# Patient Record
Sex: Female | Born: 1965 | Race: White | Hispanic: No | Marital: Single | Smoking: Never smoker
Health system: Southern US, Community
[De-identification: ages and names within clinical notes are randomized; demographics above are authoritative.]

## PROBLEM LIST (undated history)

## (undated) DIAGNOSIS — N1330 Unspecified hydronephrosis: Secondary | ICD-10-CM

## (undated) DIAGNOSIS — K743 Primary biliary cirrhosis: Secondary | ICD-10-CM

## (undated) DIAGNOSIS — Z22322 Carrier or suspected carrier of Methicillin resistant Staphylococcus aureus: Secondary | ICD-10-CM

## (undated) DIAGNOSIS — I5189 Other ill-defined heart diseases: Secondary | ICD-10-CM

## (undated) DIAGNOSIS — N200 Calculus of kidney: Secondary | ICD-10-CM

## (undated) DIAGNOSIS — C801 Malignant (primary) neoplasm, unspecified: Secondary | ICD-10-CM

## (undated) DIAGNOSIS — E785 Hyperlipidemia, unspecified: Secondary | ICD-10-CM

## (undated) DIAGNOSIS — Z8669 Personal history of other diseases of the nervous system and sense organs: Secondary | ICD-10-CM

## (undated) DIAGNOSIS — T7840XA Allergy, unspecified, initial encounter: Secondary | ICD-10-CM

## (undated) DIAGNOSIS — G43909 Migraine, unspecified, not intractable, without status migrainosus: Secondary | ICD-10-CM

## (undated) DIAGNOSIS — Z7901 Long term (current) use of anticoagulants: Secondary | ICD-10-CM

## (undated) DIAGNOSIS — D509 Iron deficiency anemia, unspecified: Secondary | ICD-10-CM

## (undated) DIAGNOSIS — I10 Essential (primary) hypertension: Secondary | ICD-10-CM

## (undated) DIAGNOSIS — E042 Nontoxic multinodular goiter: Secondary | ICD-10-CM

## (undated) DIAGNOSIS — G4730 Sleep apnea, unspecified: Secondary | ICD-10-CM

## (undated) DIAGNOSIS — J45909 Unspecified asthma, uncomplicated: Secondary | ICD-10-CM

## (undated) DIAGNOSIS — K760 Fatty (change of) liver, not elsewhere classified: Secondary | ICD-10-CM

## (undated) DIAGNOSIS — Z9884 Bariatric surgery status: Secondary | ICD-10-CM

## (undated) DIAGNOSIS — Z8679 Personal history of other diseases of the circulatory system: Secondary | ICD-10-CM

## (undated) DIAGNOSIS — N201 Calculus of ureter: Secondary | ICD-10-CM

## (undated) DIAGNOSIS — S3011XA Contusion of abdominal wall, initial encounter: Secondary | ICD-10-CM

## (undated) DIAGNOSIS — C44301 Unspecified malignant neoplasm of skin of nose: Secondary | ICD-10-CM

## (undated) DIAGNOSIS — R001 Bradycardia, unspecified: Secondary | ICD-10-CM

## (undated) DIAGNOSIS — N870 Mild cervical dysplasia: Secondary | ICD-10-CM

## (undated) DIAGNOSIS — Z85828 Personal history of other malignant neoplasm of skin: Secondary | ICD-10-CM

## (undated) DIAGNOSIS — Z973 Presence of spectacles and contact lenses: Secondary | ICD-10-CM

## (undated) DIAGNOSIS — M810 Age-related osteoporosis without current pathological fracture: Secondary | ICD-10-CM

## (undated) DIAGNOSIS — K449 Diaphragmatic hernia without obstruction or gangrene: Secondary | ICD-10-CM

## (undated) DIAGNOSIS — S301XXA Contusion of abdominal wall, initial encounter: Secondary | ICD-10-CM

## (undated) DIAGNOSIS — Z8639 Personal history of other endocrine, nutritional and metabolic disease: Secondary | ICD-10-CM

## (undated) DIAGNOSIS — E119 Type 2 diabetes mellitus without complications: Secondary | ICD-10-CM

## (undated) DIAGNOSIS — Z8719 Personal history of other diseases of the digestive system: Secondary | ICD-10-CM

## (undated) DIAGNOSIS — K219 Gastro-esophageal reflux disease without esophagitis: Secondary | ICD-10-CM

## (undated) DIAGNOSIS — Z87442 Personal history of urinary calculi: Secondary | ICD-10-CM

## (undated) HISTORY — PX: LIPOSUCTION: SHX10

## (undated) HISTORY — DX: Primary biliary cirrhosis: K74.3

## (undated) HISTORY — DX: Hyperlipidemia, unspecified: E78.5

## (undated) HISTORY — DX: Type 2 diabetes mellitus without complications: E11.9

## (undated) HISTORY — DX: Sleep apnea, unspecified: G47.30

## (undated) HISTORY — PX: CHOLECYSTECTOMY: SHX55

## (undated) HISTORY — PX: HERNIA REPAIR: SHX51

## (undated) HISTORY — PX: LAPAROSCOPIC CHOLECYSTECTOMY: SUR755

## (undated) HISTORY — DX: Migraine, unspecified, not intractable, without status migrainosus: G43.909

## (undated) HISTORY — PX: OTHER SURGICAL HISTORY: SHX169

## (undated) HISTORY — PX: BRACHIOPLASTY: SHX5755

## (undated) HISTORY — DX: Calculus of kidney: N20.0

## (undated) HISTORY — PX: CYSTOSCOPY WITH RETROGRADE PYELOGRAM, URETEROSCOPY AND STENT PLACEMENT: SHX5789

## (undated) HISTORY — DX: Allergy, unspecified, initial encounter: T78.40XA

## (undated) HISTORY — PX: PANNICULECTOMY: SHX5360

## (undated) HISTORY — PX: TONSILLECTOMY AND ADENOIDECTOMY: SUR1326

## (undated) HISTORY — PX: PLACEMENT OF BREAST IMPLANTS: SHX6334

## (undated) HISTORY — PX: BARIATRIC SURGERY: SHX1103

## (undated) HISTORY — DX: Essential (primary) hypertension: I10

## (undated) HISTORY — PX: AUGMENTATION MAMMAPLASTY: SUR837

---
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
## 2005-06-15 ENCOUNTER — Ambulatory Visit: Payer: Self-pay | Admitting: Internal Medicine

## 2006-05-24 ENCOUNTER — Emergency Department: Payer: Self-pay | Admitting: Emergency Medicine

## 2007-04-21 ENCOUNTER — Emergency Department: Payer: Self-pay | Admitting: Emergency Medicine

## 2008-07-16 ENCOUNTER — Emergency Department: Payer: Self-pay | Admitting: Emergency Medicine

## 2010-09-30 ENCOUNTER — Ambulatory Visit: Payer: Self-pay | Admitting: Family Medicine

## 2012-02-07 ENCOUNTER — Ambulatory Visit: Payer: Self-pay | Admitting: Urology

## 2012-02-07 ENCOUNTER — Observation Stay: Payer: Self-pay | Admitting: Urology

## 2012-02-07 LAB — BASIC METABOLIC PANEL
Anion Gap: 12 (ref 7–16)
BUN: 14 mg/dL (ref 7–18)
Calcium, Total: 9 mg/dL (ref 8.5–10.1)
Chloride: 104 mmol/L (ref 98–107)
Co2: 25 mmol/L (ref 21–32)
Creatinine: 0.79 mg/dL (ref 0.60–1.30)
EGFR (African American): >60
EGFR (Non-African Amer.): >60
Glucose: 149 mg/dL — ABNORMAL HIGH (ref 65–99)
Osmolality: 285 (ref 275–301)
Potassium: 4.2 mmol/L (ref 3.5–5.1)
Sodium: 141 mmol/L (ref 136–145)

## 2012-02-07 LAB — URINALYSIS, COMPLETE
Bilirubin,UR: NEGATIVE
Glucose,UR: NEGATIVE mg/dL (ref 0–75)
Ketone: NEGATIVE
Leukocyte Esterase: NEGATIVE
Nitrite: NEGATIVE
Ph: 5 (ref 4.5–8.0)
Protein: 30
RBC,UR: 189 /HPF (ref 0–5)
Specific Gravity: 1.018 (ref 1.003–1.030)
Squamous Epithelial: 18
WBC UR: 2 /HPF (ref 0–5)

## 2012-02-07 LAB — CBC
HCT: 42.5 % (ref 35.0–47.0)
HGB: 14.2 g/dL (ref 12.0–16.0)
MCH: 30 pg (ref 26.0–34.0)
MCHC: 33.4 g/dL (ref 32.0–36.0)
MCV: 90 fL (ref 80–100)
Platelet: 214 10*3/uL (ref 150–440)
RBC: 4.72 10*6/uL (ref 3.80–5.20)
RDW: 13.5 % (ref 11.5–14.5)
WBC: 6.4 10*3/uL (ref 3.6–11.0)

## 2012-02-07 LAB — PREGNANCY, URINE: Pregnancy Test, Urine: NEGATIVE m[IU]/mL

## 2012-02-08 LAB — CBC WITH DIFFERENTIAL/PLATELET
Basophil #: 0 10*3/uL (ref 0.0–0.1)
Basophil %: 0.3 %
Eosinophil #: 0.1 10*3/uL (ref 0.0–0.7)
Eosinophil %: 1 %
HCT: 37.4 % (ref 35.0–47.0)
HGB: 12.4 g/dL (ref 12.0–16.0)
Lymphocyte #: 2.4 10*3/uL (ref 1.0–3.6)
Lymphocyte %: 28.4 %
MCH: 30.1 pg (ref 26.0–34.0)
MCHC: 33.1 g/dL (ref 32.0–36.0)
MCV: 91 fL (ref 80–100)
Monocyte #: 0.7 10*3/uL (ref 0.0–0.7)
Monocyte %: 8.1 %
Neutrophil #: 5.2 10*3/uL (ref 1.4–6.5)
Neutrophil %: 62.2 %
Platelet: 202 10*3/uL (ref 150–440)
RBC: 4.12 10*6/uL (ref 3.80–5.20)
RDW: 14.1 % (ref 11.5–14.5)
WBC: 8.3 10*3/uL (ref 3.6–11.0)

## 2012-02-08 LAB — BASIC METABOLIC PANEL
Anion Gap: 7 (ref 7–16)
BUN: 13 mg/dL (ref 7–18)
Calcium, Total: 8.1 mg/dL — ABNORMAL LOW (ref 8.5–10.1)
Chloride: 105 mmol/L (ref 98–107)
Co2: 29 mmol/L (ref 21–32)
Creatinine: 0.78 mg/dL (ref 0.60–1.30)
EGFR (African American): >60
EGFR (Non-African Amer.): >60
Glucose: 113 mg/dL — ABNORMAL HIGH (ref 65–99)
Osmolality: 282 (ref 275–301)
Potassium: 4 mmol/L (ref 3.5–5.1)
Sodium: 141 mmol/L (ref 136–145)

## 2012-02-09 LAB — URINE CULTURE

## 2012-02-11 ENCOUNTER — Ambulatory Visit: Payer: Self-pay | Admitting: Urology

## 2012-02-22 ENCOUNTER — Ambulatory Visit: Payer: Self-pay | Admitting: Urology

## 2012-02-29 ENCOUNTER — Ambulatory Visit: Payer: Self-pay | Admitting: Urology

## 2012-03-08 ENCOUNTER — Emergency Department: Payer: Self-pay | Admitting: Emergency Medicine

## 2012-03-08 LAB — URINALYSIS, COMPLETE
Bilirubin,UR: NEGATIVE
Glucose,UR: NEGATIVE mg/dL (ref 0–75)
Ketone: NEGATIVE
Nitrite: NEGATIVE
Ph: 5 (ref 4.5–8.0)
Protein: 100
RBC,UR: 930 /HPF (ref 0–5)
Specific Gravity: 1.017 (ref 1.003–1.030)
Squamous Epithelial: NONE SEEN
WBC UR: 16 /HPF (ref 0–5)

## 2012-03-08 LAB — BASIC METABOLIC PANEL
Anion Gap: 7 (ref 7–16)
BUN: 15 mg/dL (ref 7–18)
Calcium, Total: 9.2 mg/dL (ref 8.5–10.1)
Chloride: 105 mmol/L (ref 98–107)
Co2: 30 mmol/L (ref 21–32)
Creatinine: 0.88 mg/dL (ref 0.60–1.30)
EGFR (African American): >60
EGFR (Non-African Amer.): >60
Glucose: 114 mg/dL — ABNORMAL HIGH (ref 65–99)
Osmolality: 285 (ref 275–301)
Potassium: 4.1 mmol/L (ref 3.5–5.1)
Sodium: 142 mmol/L (ref 136–145)

## 2012-03-08 LAB — CBC
HCT: 39.7 % (ref 35.0–47.0)
HGB: 13.4 g/dL (ref 12.0–16.0)
MCH: 30.1 pg (ref 26.0–34.0)
MCHC: 33.8 g/dL (ref 32.0–36.0)
MCV: 89 fL (ref 80–100)
Platelet: 167 10*3/uL (ref 150–440)
RBC: 4.47 10*6/uL (ref 3.80–5.20)
RDW: 13.2 % (ref 11.5–14.5)
WBC: 5.4 10*3/uL (ref 3.6–11.0)

## 2012-03-09 LAB — URINE CULTURE

## 2012-03-24 ENCOUNTER — Ambulatory Visit: Payer: Self-pay | Admitting: Urology

## 2012-03-25 ENCOUNTER — Ambulatory Visit: Payer: Self-pay | Admitting: Family Medicine

## 2014-01-11 ENCOUNTER — Encounter: Payer: Self-pay | Admitting: Adult Health

## 2014-01-11 ENCOUNTER — Ambulatory Visit (INDEPENDENT_AMBULATORY_CARE_PROVIDER_SITE_OTHER): Payer: 59 | Admitting: Adult Health

## 2014-01-11 VITALS — BP 136/82 | HR 91 | Temp 98.4°F | Resp 16 | Ht 69.25 in | Wt 329.0 lb

## 2014-01-11 DIAGNOSIS — M25569 Pain in unspecified knee: Secondary | ICD-10-CM

## 2014-01-11 DIAGNOSIS — R209 Unspecified disturbances of skin sensation: Secondary | ICD-10-CM

## 2014-01-11 DIAGNOSIS — M25562 Pain in left knee: Secondary | ICD-10-CM

## 2014-01-11 DIAGNOSIS — M25561 Pain in right knee: Secondary | ICD-10-CM

## 2014-01-11 DIAGNOSIS — H6240 Otitis externa in other diseases classified elsewhere, unspecified ear: Secondary | ICD-10-CM

## 2014-01-11 DIAGNOSIS — I10 Essential (primary) hypertension: Secondary | ICD-10-CM

## 2014-01-11 DIAGNOSIS — E119 Type 2 diabetes mellitus without complications: Secondary | ICD-10-CM | POA: Insufficient documentation

## 2014-01-11 DIAGNOSIS — E669 Obesity, unspecified: Secondary | ICD-10-CM | POA: Insufficient documentation

## 2014-01-11 DIAGNOSIS — Z0000 Encounter for general adult medical examination without abnormal findings: Secondary | ICD-10-CM | POA: Insufficient documentation

## 2014-01-11 DIAGNOSIS — B369 Superficial mycosis, unspecified: Secondary | ICD-10-CM

## 2014-01-11 DIAGNOSIS — Z1239 Encounter for other screening for malignant neoplasm of breast: Secondary | ICD-10-CM

## 2014-01-11 DIAGNOSIS — R200 Anesthesia of skin: Secondary | ICD-10-CM

## 2014-01-11 DIAGNOSIS — R202 Paresthesia of skin: Secondary | ICD-10-CM

## 2014-01-11 MED ORDER — CLOTRIMAZOLE 1 % EX SOLN: 1.0000 "application " | Freq: Two times a day (BID) | CUTANEOUS | Status: DC

## 2014-01-11 NOTE — Progress Notes (Signed)
Patient ID: [name], female   DOB: 06-29-1966, 48 y.o.   MRN: [id number]    Subjective:    Patient ID: [name], female    DOB: Mar 17, 1966, 48 y.o.   MRN: [id number]  HPI  [name] is a pleasant 48 y/o female who presents to clinic to establish care. Previously followed at [facility]. Will request records. She has a few concerns and issues that she would like to address in the near future.   1) She reports that she thinks she is a diabetic and that she has had elevated HgbA1c of 8.4% recently. She has been taking glycotrol (health food store) because she has preferred to go the "holistic" route. [name] is experiencing numbness and tingling of her toes. Denies any wounds of skin breakdown of her feet.   2) She also reports that she needs to lose 10% of body weight (~33 lbs) for eligibility of premium insurance rate through her employer. She is currently working 3 jobs and finds little time to exercise or focus on her health. She has made a decision that she needs to address some of her problems before they get too out of hand. She is also having bilateral LE swelling. Works involves being on her feet for extended periods of time. She does not wear any support hose. In addition, her knees have been hurting her. She feels that by losing weight she will be able to improve her blood glucose, her leg swelling and the pain in her knees.    Past Medical History  Diagnosis Date  . Asthma   . Hypertension   . Hyperlipidemia   . Migraine     Has used imitrex  . Kidney stone      Past Surgical History  Procedure Laterality Date  . Cesarean section    . Tonsillectomy and adenoidectomy       Family History  Problem Relation Age of Onset  . Alcohol abuse Mother   . Hyperlipidemia Mother   . Hypertension Mother   . Hyperlipidemia Father   . Hypertension Father   . Parkinson's disease Father   . Drug abuse Sister   . Cancer Maternal Grandmother     breast cancer  . Cancer Paternal  Grandmother      History   Social History  . Marital Status: Single    Spouse Name: N/A    Number of Children: 2  . Years of Education: 14   Occupational History  . 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 Teaches microbiology   . [employer] - Weekend   Social History Main Topics  . Smoking status: Never Smoker   . Smokeless tobacco: Never Used  . Alcohol Use: No  . Drug Use: No  . Sexual Activity: Not on file   Other Topics Concern  . Not on file   Social History Narrative   [name] grew up partly in [place] and then [place]. She lives in [place] with her two daughter. 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 She enjoys the outdoors, gardening.      Review of Systems  Constitutional: Negative.        Overweight  HENT: Negative.   Eyes: Negative.   Respiratory: Negative.   Cardiovascular: Positive for leg swelling.  Gastrointestinal: Negative.   Endocrine: Negative.   Genitourinary: Negative.   Musculoskeletal: Positive for arthralgias (  knee pain bil) and joint swelling.  Skin:       Cyst on midline chest. Has appointment to be evaluated by derm  Allergic/Immunologic: Negative.   Neurological: Positive for numbness (tingling of LE. Cramping during the night).  Hematological: Negative.   Psychiatric/Behavioral: Negative.        Objective:  BP 136/82  Pulse 91  Temp(Src) 98.4 F (36.9 C) (Oral)  Resp 16  Ht 5' 9.25" (1.759 m)  Wt 329 lb (149.233 kg)  BMI 48.23 kg/m2  SpO2 95%  LMP 10/28/2013   Physical Exam  Constitutional: She is oriented to person, place, and time. No distress.  Overweight pleasant female  HENT:  Head: Normocephalic and atraumatic.  Right Ear: External ear normal.  Nose: Nose normal.  Mouth/Throat: Oropharynx is clear and moist.  Left ear canal fungus  Eyes: Conjunctivae and EOM are normal. Pupils are equal, round, and reactive to light.    Neck: Normal range of motion. Neck supple. No tracheal deviation present. No thyromegaly present.  Cardiovascular: Normal rate, regular rhythm, normal heart sounds and intact distal pulses.  Exam reveals no gallop and no friction rub.   No murmur heard. Pulmonary/Chest: Effort normal and breath sounds normal. No respiratory distress. She has no wheezes. She has no rales.  Abdominal: Soft. Bowel sounds are normal. She exhibits no distension and no mass. There is tenderness (epigastric area). There is no rebound and no guarding.  Musculoskeletal: Normal range of motion. She exhibits edema. She exhibits no tenderness.  Trace edema bil LE  Lymphadenopathy:    She has no cervical adenopathy.  Neurological: She is alert and oriented to person, place, and time. She has normal reflexes. No cranial nerve deficit. Coordination normal.  Skin: Skin is warm and dry.  Cysts - appears sebaceous - she will be seeing derm to evaluate for removal  Psychiatric: She has a normal mood and affect. Her behavior is normal. Judgment and thought content normal.       Assessment & Plan:   1. Routine general medical examination at a health care facility Normal physical exam excluding breast, pelvic/PAP. Problems identified are listed separately. Will check routine labs: cbc w/diff, cmet, lipids, HgbA1c, TSH, vit D, B12. She works at [employer] and will have these drawn there. Will request medical records from previous PCP.   2. Screening for breast cancer Needs screening mammogram. Pt will self schedule  - MM DIGITAL SCREENING BILATERAL; Future  3. DM type 2 (diabetes mellitus, type 2) Last HgbA1c 8.4% per pt report. Will recheck lab. She has been taking OTC supplement from health food store. Had discussion with pt regarding lack of regulation with supplements. Would agree in holistic approach with other problems but not with diabetes given severe consequences of poorly controlled diabetes. She appeared to be in  agreement. If A1C above 7% will start medication. Check urine microalbumin  4. Numbness and tingling of both legs Suspect diabetic neuropathy given reports of elevated A1c. Priority is management of diabetes. May need medication such as gabapentin to management symptoms  5. HTN On atenolol and appears to be controlled. Reports good control. Check metabolic panel. Continue to follow.  6. Knee pain, bilateral Weight contributing to symptoms. She wants to lose weight. I suspect this will alleviate some of her symptoms. May need referral to Ortho for evaluation and further recommendations.  7. 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 Start clotrimazole 1% solution bid x 10 days. Swab into ear canal and allow to  melt down in canal. Re-assess after 10 days. If fungus still present will refer to ENT.  8. Obesity Needs to lose 10% of body weight which is ~ 34 lbs. Provided with Dr. Lupita Dawn diet information which focuses on low glycemic index foods. This will help her control her blood glucose and also lose weight.

## 2014-01-11 NOTE — Progress Notes (Signed)
Pre visit review using our clinic review tool, if applicable. No additional management support is needed unless otherwise documented below in the visit note. 

## 2014-01-12 ENCOUNTER — Telehealth: Payer: Self-pay | Admitting: *Deleted

## 2014-01-12 ENCOUNTER — Telehealth: Payer: Self-pay

## 2014-01-12 ENCOUNTER — Telehealth: Payer: Self-pay | Admitting: Adult Health

## 2014-01-12 NOTE — Telephone Encounter (Signed)
Relevant patient education assigned to patient using Emmi. ° °

## 2014-01-12 NOTE — Telephone Encounter (Signed)
Left message for pt, advising to use Clotrimazole 1% solution bid x 10 days, swab in ear canal and allow to melt down into ear canal.  Advised to have followup appointment in 15 days, recommended to cal office to schedule followup appointment.

## 2014-01-22 ENCOUNTER — Encounter (INDEPENDENT_AMBULATORY_CARE_PROVIDER_SITE_OTHER): Payer: Self-pay

## 2014-01-22 ENCOUNTER — Encounter: Payer: Self-pay | Admitting: Adult Health

## 2014-01-22 ENCOUNTER — Encounter: Payer: Self-pay | Admitting: General Surgery

## 2014-01-22 ENCOUNTER — Ambulatory Visit (INDEPENDENT_AMBULATORY_CARE_PROVIDER_SITE_OTHER): Payer: 59 | Admitting: Adult Health

## 2014-01-22 VITALS — BP 126/86 | HR 91 | Resp 16 | Wt 328.0 lb

## 2014-01-22 DIAGNOSIS — IMO0001 Reserved for inherently not codable concepts without codable children: Secondary | ICD-10-CM

## 2014-01-22 DIAGNOSIS — E1165 Type 2 diabetes mellitus with hyperglycemia: Secondary | ICD-10-CM

## 2014-01-22 DIAGNOSIS — IMO0002 Reserved for concepts with insufficient information to code with codable children: Secondary | ICD-10-CM

## 2014-01-22 DIAGNOSIS — L729 Follicular cyst of the skin and subcutaneous tissue, unspecified: Secondary | ICD-10-CM

## 2014-01-22 DIAGNOSIS — L723 Sebaceous cyst: Secondary | ICD-10-CM

## 2014-01-22 DIAGNOSIS — B369 Superficial mycosis, unspecified: Secondary | ICD-10-CM

## 2014-01-22 DIAGNOSIS — H6240 Otitis externa in other diseases classified elsewhere, unspecified ear: Secondary | ICD-10-CM

## 2014-01-22 DIAGNOSIS — R059 Cough, unspecified: Secondary | ICD-10-CM

## 2014-01-22 DIAGNOSIS — R05 Cough: Secondary | ICD-10-CM

## 2014-01-22 MED ORDER — INSULIN DETEMIR 100 UNIT/ML FLEXPEN: 10.0000 [IU] | PEN_INJECTOR | Freq: Every day | SUBCUTANEOUS | Status: DC

## 2014-01-22 MED ORDER — BENZONATATE 200 MG PO CAPS: 200.0000 mg | ORAL_CAPSULE | Freq: Two times a day (BID) | ORAL | Status: DC | PRN

## 2014-01-22 MED ORDER — METFORMIN HCL 500 MG PO TABS: 500.0000 mg | ORAL_TABLET | Freq: Two times a day (BID) | ORAL | Status: DC

## 2014-01-22 NOTE — Progress Notes (Signed)
Patient ID: [name], female   DOB: Jan 12, 1966, 48 y.o.   MRN: [id number]    Subjective:    Patient ID: [name], female    DOB: [birth year], 48 y.o.   MRN: [id number]  HPI  Pt is a 48 y/o female who recently establish care in my clinic. She had labs drawn which showed diabetes uncontrolled (HgbA1c 11.7%), elevated microalbumin, HLD, elevated liver enzymes. When she first saw me she stated that she wanted a more holistic approach to her healthcare. She was taking OTC supplement to control her diabetes but, as can be seen, this has been ineffective.  During last visit she was prescribed clotrimazole bid to treat fungal ear infection. She reports that she went to [facility] for cough approximately 4 days ago and was told she should not do this medication "there was nothing wrong with her ear". So, pt did not start treatment for her fungal ear infection. Her cough is still ongoing. Feeling better but not resolved.  She has a cyst on the center of her chest between both breast. She went to see a Dermatologist but she did not remove the cyst. She wants the cyst removed.    Past Medical History  Diagnosis Date  . Asthma   . Hypertension   . Hyperlipidemia   . Migraine     Has used imitrex  . Kidney stone     Current Outpatient Prescriptions on File Prior to Visit  Medication Sig Dispense Refill  . atenolol (TENORMIN) 25 MG tablet Take 25 mg by mouth daily.      . Chromium Picolinate 200 MCG CAPS Take 1 capsule by mouth 3 (three) times daily with meals.      Marland Kitchen Specialty Vitamins Products (GLYCOTROL PO) Take 1 capsule by mouth 3 (three) times daily with meals.      . clotrimazole (LOTRIMIN) 1 % external solution Apply 1 application topically 2 (two) times daily. Apply to external ear canal twice daily x 10 days as directed.  30 mL  0   No current facility-administered medications on file prior to visit.     Review of Systems  Constitutional: Negative.   HENT: Negative for ear  discharge and ear pain.   Respiratory: Positive for cough.   Cardiovascular: Negative.  Negative for chest pain and leg swelling.  Gastrointestinal: Negative.   All other systems reviewed and are negative.       Objective:  BP 126/86  Pulse 91  Resp 16  Wt 328 lb (148.78 kg)  SpO2 96%  LMP 10/28/2013   Physical Exam  Constitutional: She is oriented to person, place, and time.  Overweight, pleasant female in NAD  HENT:  Head: Normocephalic and atraumatic.  Mouth/Throat: Oropharynx is clear and moist.  Left ear fungus noted on TM  Cardiovascular: Normal rate, regular rhythm, normal heart sounds and intact distal pulses.  Exam reveals no gallop and no friction rub.   No murmur heard. Pulmonary/Chest: Effort normal and breath sounds normal. No respiratory distress. She has no wheezes. She has no rales.  Musculoskeletal: Normal range of motion.  Lymphadenopathy:    She has no cervical adenopathy.  Neurological: She is alert and oriented to person, place, and time.  Skin: Skin is warm and dry.  Psychiatric: She has a normal mood and affect. Her behavior is normal. Judgment and thought content normal.       Assessment & Plan:    1. Diabetes type 2, uncontrolled Start metformin 500 mg  bid. Will increase if tolerates current dose. Also start Levemir 10 units daily. Increase every 3 days by 3 units until blood glucose below 150. Refer to diabetic education since newly diagnosed. Repeat A1c in 3 months. Provided pt with glucometer. She is to check her blood glucose 3-4 daily  - Ambulatory referral to diabetic education  2. Otic fungal infection Lotrimin bid to bilateral ears. If no resolution of fungal infection will refer to ENT  3. Cyst of skin Pt has cyst in the center of her chest. She saw Dermatology but cyst was not removed. Pt would like cyst removed. Refer to Dr. Bary Castilla. - Ambulatory referral to General Surgery  4. Cough Tessalon for cough. Lungs are clear.

## 2014-01-22 NOTE — Patient Instructions (Addendum)
  I am referring you to [facility] for diabetic education.  They will contact you with an appointment.  Start Levemir Insulin 10 units sub q daily into the skin.  Check your blood glucose 3-4 times a day before meals and at bedtime and record. Bring with you at your next appointment.  Start Metformin 500 mg twice a day with meals.  Use clotrimazole (lotrimin) for your ear fungus twice a day.  Tessalon for your cough twice a day as needed.  I am referring you to a general surgeon for the cyst on your chest.  Return for follow up appointment in 4 weeks.

## 2014-01-22 NOTE — Progress Notes (Signed)
Pre visit review using our clinic review tool, if applicable. No additional management support is needed unless otherwise documented below in the visit note. 

## 2014-01-23 ENCOUNTER — Telehealth: Payer: Self-pay | Admitting: *Deleted

## 2014-01-23 NOTE — Telephone Encounter (Signed)
Advised Pt that [facility] Lab order form is ready for pickup

## 2014-02-01 ENCOUNTER — Encounter: Payer: Self-pay | Admitting: Adult Health

## 2014-02-07 ENCOUNTER — Encounter: Payer: Self-pay | Admitting: General Surgery

## 2014-02-07 ENCOUNTER — Ambulatory Visit (INDEPENDENT_AMBULATORY_CARE_PROVIDER_SITE_OTHER): Payer: 59 | Admitting: General Surgery

## 2014-02-07 VITALS — BP 124/76 | HR 82 | Resp 14 | Ht 68.0 in | Wt 330.0 lb

## 2014-02-07 DIAGNOSIS — L02219 Cutaneous abscess of trunk, unspecified: Secondary | ICD-10-CM

## 2014-02-07 DIAGNOSIS — L02213 Cutaneous abscess of chest wall: Secondary | ICD-10-CM

## 2014-02-07 DIAGNOSIS — L03319 Cellulitis of trunk, unspecified: Secondary | ICD-10-CM

## 2014-02-07 NOTE — Progress Notes (Signed)
Patient ID: [name], female   DOB: 03-Oct-1966, 48 y.o.   MRN: [id number]  Chief Complaint  Patient presents with  . Other    cyst     HPI [name] is a 48 y.o. female.  who presents for a cyst between her breast and breast evaluation. Patient does not perform regular self breast checks.   States she noticed it about 6 months ago. She states the area had gotten bigger. Later it drained a lot. tDr. Derrel Nip put her on doxycycline and the area got smaller. 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  HPI  Past Medical History  Diagnosis Date  . Asthma   . Hypertension   . Hyperlipidemia   . Migraine     Has used imitrex  . Kidney stone   . Diabetes mellitus without complication     Past Surgical History  Procedure Laterality Date  . Cesarean section    . Tonsillectomy and adenoidectomy      Family History  Problem Relation Age of Onset  . Alcohol abuse Mother   . Hyperlipidemia Mother   . Hypertension Mother   . Hyperlipidemia Father   . Hypertension Father   . Parkinson's disease Father   . Drug abuse Sister   . Cancer Maternal Grandmother     breast cancer  . Cancer Paternal Grandmother     Social History History  Substance Use Topics  . Smoking status: Never Smoker   . Smokeless tobacco: Never Used  . Alcohol Use: No    Allergies  Allergen Reactions  . Nsaids Hives    Current Outpatient Prescriptions  Medication Sig Dispense Refill  . atenolol (TENORMIN) 25 MG tablet Take 25 mg by mouth daily.      . clotrimazole (LOTRIMIN) 1 % external solution Apply 1 application topically 2 (two) times daily. Apply to external ear canal twice daily x 10 days as directed.  30 mL  0  . Insulin Detemir (LEVEMIR) 100 UNIT/ML Pen Inject 10 Units into the skin daily at 10 pm.  3 pen  11  . metFORMIN (GLUCOPHAGE) 500 MG tablet Take 1 tablet (500 mg total) by mouth 2 (two) times daily with a meal.  60 tablet  3  . benzonatate  (TESSALON) 200 MG capsule Take 1 capsule (200 mg total) by mouth 2 (two) times daily as needed for cough.  30 capsule  0   No current facility-administered medications for this visit.    Review of Systems Review of Systems  Constitutional: Negative.   Respiratory: Negative.   Cardiovascular: Negative.     Blood pressure 124/76, pulse 82, resp. rate 14, height 5\' 8"  (1.727 m), weight 330 lb (149.687 kg), last menstrual period 10/28/2013.  Physical Exam Physical Exam  Constitutional: She is oriented to person, place, and time. She appears well-developed and well-nourished.  Lymphadenopathy:    She has no axillary adenopathy.  Neurological: She is alert and oriented to person, place, and time.  Skin: Skin is warm and dry.  Patient points to the upper stereum area where she had the abscess recently. There is no papilae or visible finding in this area. No apparent signs of skin involvement in axillae or chest wall area.   Data Reviewed  none Assessment    By history upper chest skin abscess is resolved. No residual findings at present.    Plan    Patient to return as needed. If there is  recurrence of the swelling she is to call for a reevaluation.       Delvecchio Madole G 02/07/2014, 11:42 AM

## 2014-02-13 ENCOUNTER — Encounter: Payer: Self-pay | Admitting: Adult Health

## 2014-02-13 ENCOUNTER — Telehealth: Payer: Self-pay | Admitting: *Deleted

## 2014-02-13 NOTE — Telephone Encounter (Signed)
Pharmacy Note:  Please send Rx for Novofine 32G tip needle with instructions and Dx code

## 2014-02-13 NOTE — Telephone Encounter (Signed)
Can you call pharmacy and clarify this request

## 2014-02-14 MED ORDER — INSULIN PEN NEEDLE 32G X 6 MM MISC
1.0000 | Freq: Every day | Status: DC
Start: 2014-02-14 — End: 2014-04-05

## 2014-02-14 NOTE — Telephone Encounter (Signed)
Instructions need to be how many times a day pt is to use and the Dx code is 250.00

## 2014-02-14 NOTE — Telephone Encounter (Signed)
But what is Novofine - is that a type of needle? I have never ordered this

## 2014-02-21 ENCOUNTER — Ambulatory Visit: Payer: 59 | Admitting: Adult Health

## 2014-02-26 ENCOUNTER — Encounter: Payer: Self-pay | Admitting: Adult Health

## 2014-02-26 ENCOUNTER — Ambulatory Visit (INDEPENDENT_AMBULATORY_CARE_PROVIDER_SITE_OTHER): Payer: 59 | Admitting: Adult Health

## 2014-02-26 VITALS — BP 114/84 | HR 83 | Temp 98.3°F | Resp 14 | Wt 329.0 lb

## 2014-02-26 DIAGNOSIS — IMO0002 Reserved for concepts with insufficient information to code with codable children: Secondary | ICD-10-CM

## 2014-02-26 DIAGNOSIS — IMO0001 Reserved for inherently not codable concepts without codable children: Secondary | ICD-10-CM

## 2014-02-26 DIAGNOSIS — B49 Unspecified mycosis: Secondary | ICD-10-CM

## 2014-02-26 DIAGNOSIS — E1165 Type 2 diabetes mellitus with hyperglycemia: Secondary | ICD-10-CM

## 2014-02-26 MED ORDER — GLUCOSE BLOOD VI STRP: ORAL_STRIP | Status: DC

## 2014-02-26 MED ORDER — "INSULIN SYRINGE-NEEDLE U-100 25G X 5/8"" 1 ML MISC": 1.0000 | Status: DC | PRN

## 2014-02-26 MED ORDER — INSULIN REGULAR HUMAN 100 UNIT/ML IJ SOLN: INTRAMUSCULAR | Status: DC

## 2014-02-26 NOTE — Patient Instructions (Signed)
Continue the levemir 10 units in the morning and 10 units in the evening.  Check blood glucose before meals and at bedtime. Record your readings.  Before meals Sliding scale as follows: Less than 70 = 0 units 70-130 = 0 units 131-180 = 4 units 181-240 = 8 units 241-300 = 10 units 301-350 = 12 units 351-400 = 16 units >400 = 16 units and call PCP  Increase physical activity. Start walking for 30 minutes 3 times a week and gradually increase.

## 2014-02-26 NOTE — Progress Notes (Signed)
Subjective:    Patient ID: [name], female    DOB: 1966/09/29, 48 y.o.   MRN: [id number]  HPI  Patient is a pleasant 48 year old female who presents to clinic for followup of uncontrolled diabetes. Recently her hemoglobin A1c was 11.9%. She is currently on Levemir 10 units in the evening. She has increased the Levemir to 10 units in the morning and 10 units in the evening. Blood sugars are averaging 240. Her labs also showed that she had elevated triglycerides at 444 mg/dL; however, she reports that she was not fasting when she had this blood work done. She had lipids drawn in March which showed slightly elevated triglycerides but not as high as these levels. Patient reports that she has not been feeling well. She is feeling sluggish, fatigued.   Current Outpatient Prescriptions on File Prior to Visit  Medication Sig Dispense Refill  . atenolol (TENORMIN) 25 MG tablet Take 25 mg by mouth daily.      . Insulin Detemir (LEVEMIR) 100 UNIT/ML Pen Inject 10 Units into the skin daily at 10 pm.  3 pen  11  . Insulin Pen Needle 32G X 6 MM MISC Inject 1 applicator into the skin at bedtime.  50 each  1  . metFORMIN (GLUCOPHAGE) 500 MG tablet Take 1 tablet (500 mg total) by mouth 2 (two) times daily with a meal.  60 tablet  3   No current facility-administered medications on file prior to visit.    Review of Systems  Constitutional: Positive for activity change and fatigue.  Respiratory: Negative.   Cardiovascular: Negative.   Gastrointestinal: Negative.   Genitourinary: Negative.   Musculoskeletal: Negative.   Neurological: Negative for dizziness, tremors, syncope, light-headedness and headaches.  Psychiatric/Behavioral: Negative.   All other systems reviewed and are negative.      Objective:   Physical Exam  Constitutional: She is oriented to person, place, and time. No distress.  Overweight, female  HENT:  Head: Normocephalic and atraumatic.  Left ear with white coating on TM.  She has been treated for fungus; however, not improved.  Eyes: Conjunctivae and EOM are normal.  Neck: Normal range of motion. Neck supple.  Cardiovascular: Normal rate, regular rhythm, normal heart sounds and intact distal pulses.  Exam reveals no gallop and no friction rub.   No murmur heard. Pulmonary/Chest: Effort normal and breath sounds normal. No respiratory distress. She has no wheezes. She has no rales.  Musculoskeletal: Normal range of motion.  Neurological: She is alert and oriented to person, place, and time. She has normal reflexes. Coordination normal.  Skin: Skin is warm and dry.  Psychiatric: She has a normal mood and affect. Her behavior is normal. Judgment and thought content normal.        Assessment & Plan:   1. Diabetes type 2, uncontrolled Levemir 10 units in the morning and 10 units in the evening. I have suggested to pt that if her am blood glucose levels remain above 150 that she should add 3 units to the evening dose of levemir. We are also adding a sliding scale. She is to check her blood sugar levels before each meal and cover as below. Return for f/u in 3 months. Call with any questions or concerns. Additional time spent with education on administration and use of sliding scale.  Sliding scale as follows: Less than 70 = 0 units 70-130 = 0 units 131-180 = 4 units 181-240 = 8 units 241-300 = 10 units 301-350 = 12  units 351-400 = 16 units >400 = 16 units and call PCP   2. Fungus infection Fungal ear infection treated with lotrin but not resolved. I am referring her to ENT for further evaluation and management.  - Ambulatory referral to ENT

## 2014-02-26 NOTE — Progress Notes (Signed)
Pre visit review using our clinic review tool, if applicable. No additional management support is needed unless otherwise documented below in the visit note. 

## 2014-03-01 ENCOUNTER — Encounter: Payer: Self-pay | Admitting: Adult Health

## 2014-03-05 ENCOUNTER — Telehealth: Payer: Self-pay | Admitting: *Deleted

## 2014-03-05 NOTE — Telephone Encounter (Signed)
PA request form for the Contour test strips placed in Raquel box

## 2014-03-27 ENCOUNTER — Encounter: Payer: Self-pay | Admitting: Adult Health

## 2014-03-29 ENCOUNTER — Other Ambulatory Visit: Payer: Self-pay | Admitting: Adult Health

## 2014-03-29 MED ORDER — "INSULIN SYRINGE 30G X 5/16"" 0.5 ML MISC": Status: DC

## 2014-04-03 ENCOUNTER — Encounter: Payer: Self-pay | Admitting: Adult Health

## 2014-04-05 MED ORDER — INSULIN PEN NEEDLE 32G X 6 MM MISC: 1.0000 | Freq: Every day | Status: DC

## 2014-04-05 MED ORDER — METFORMIN HCL 500 MG PO TABS: 500.0000 mg | ORAL_TABLET | Freq: Two times a day (BID) | ORAL | Status: DC

## 2014-04-05 MED ORDER — INSULIN DETEMIR 100 UNIT/ML FLEXPEN: 10.0000 [IU] | PEN_INJECTOR | Freq: Every day | SUBCUTANEOUS | Status: DC

## 2014-04-05 MED ORDER — GLUCOSE BLOOD VI STRP: ORAL_STRIP | Status: DC

## 2014-04-05 MED ORDER — ATENOLOL 25 MG PO TABS: 25.0000 mg | ORAL_TABLET | Freq: Every day | ORAL | Status: DC

## 2014-04-05 MED ORDER — INSULIN REGULAR HUMAN 100 UNIT/ML IJ SOLN: INTRAMUSCULAR | Status: DC

## 2014-04-05 MED ORDER — "INSULIN SYRINGE 30G X 5/16"" 0.5 ML MISC": Status: DC

## 2014-04-06 ENCOUNTER — Encounter: Payer: Self-pay | Admitting: Adult Health

## 2014-04-06 ENCOUNTER — Other Ambulatory Visit: Payer: Self-pay

## 2014-04-06 ENCOUNTER — Ambulatory Visit (INDEPENDENT_AMBULATORY_CARE_PROVIDER_SITE_OTHER)
Admission: RE | Admit: 2014-04-06 | Discharge: 2014-04-06 | Disposition: A | Discharge status: Home / Self Care | Payer: 59 | Source: Ambulatory Visit | Attending: Adult Health | Admitting: Adult Health

## 2014-04-06 ENCOUNTER — Ambulatory Visit (INDEPENDENT_AMBULATORY_CARE_PROVIDER_SITE_OTHER): Payer: 59 | Admitting: Adult Health

## 2014-04-06 VITALS — BP 130/84 | HR 90 | Temp 98.4°F | Resp 16 | Ht 68.0 in | Wt 330.2 lb

## 2014-04-06 DIAGNOSIS — R609 Edema, unspecified: Secondary | ICD-10-CM

## 2014-04-06 DIAGNOSIS — R600 Localized edema: Secondary | ICD-10-CM

## 2014-04-06 DIAGNOSIS — M25561 Pain in right knee: Secondary | ICD-10-CM

## 2014-04-06 DIAGNOSIS — M25569 Pain in unspecified knee: Secondary | ICD-10-CM

## 2014-04-06 LAB — CBC WITH DIFFERENTIAL/PLATELET
Basophils Absolute: 0 10*3/uL (ref 0.0–0.1)
Basophils Relative: 0.4 % (ref 0.0–3.0)
Eosinophils Absolute: 0.1 10*3/uL (ref 0.0–0.7)
Eosinophils Relative: 2.9 % (ref 0.0–5.0)
HCT: 40.4 % (ref 36.0–46.0)
Hemoglobin: 13.4 g/dL (ref 12.0–15.0)
Lymphocytes Relative: 38.7 % (ref 12.0–46.0)
Lymphs Abs: 1.9 10*3/uL (ref 0.7–4.0)
MCHC: 33.1 g/dL (ref 30.0–36.0)
MCV: 87.7 fl (ref 78.0–100.0)
Monocytes Absolute: 0.3 10*3/uL (ref 0.1–1.0)
Monocytes Relative: 6.8 % (ref 3.0–12.0)
Neutro Abs: 2.5 10*3/uL (ref 1.4–7.7)
Neutrophils Relative %: 51.2 % (ref 43.0–77.0)
Platelets: 157 10*3/uL (ref 150.0–400.0)
RBC: 4.61 Mil/uL (ref 3.87–5.11)
RDW: 13.1 % (ref 11.5–15.5)
WBC: 4.9 10*3/uL (ref 4.0–10.5)

## 2014-04-06 LAB — C-REACTIVE PROTEIN: CRP: 1.3 mg/dL (ref 0.5–20.0)

## 2014-04-06 LAB — SEDIMENTATION RATE: Sed Rate: 23 mm/hr — ABNORMAL HIGH (ref 0–22)

## 2014-04-06 MED ORDER — INSULIN DETEMIR 100 UNIT/ML FLEXPEN: 10.0000 [IU] | PEN_INJECTOR | Freq: Every day | SUBCUTANEOUS | Status: DC

## 2014-04-06 MED ORDER — HYDROCHLOROTHIAZIDE 25 MG PO TABS: 25.0000 mg | ORAL_TABLET | Freq: Every day | ORAL | Status: DC

## 2014-04-06 MED ORDER — TRAMADOL HCL 50 MG PO TABS: 50.0000 mg | ORAL_TABLET | Freq: Three times a day (TID) | ORAL | Status: DC | PRN

## 2014-04-06 NOTE — Progress Notes (Signed)
Patient ID: [name], female   DOB: [birth year], 48 y.o.   MRN: [id number]   Subjective:    Patient ID: [name], female    DOB: March 04, 1966, 48 y.o.   MRN: [id number]  HPI  !) Pain in right knee. Hurts with bending and weight bearing. Has had problems with both of her knees in the past. She was told that she was just getting older. Pain has worsened. Denies known injury. Has to sit for extended periods and having knee bent is also painful.  2) Bilateral LE edema and redness. Legs feel tight. Swelling improves in the AM after she has been lying down. The skin feels tight but the legs are not painful. No reports of skin breakdown. No other associated symptoms. No sob, chest pain.   Past Medical History  Diagnosis Date  . Asthma   . Hypertension   . Hyperlipidemia   . Migraine     Has used imitrex  . Kidney stone   . Diabetes mellitus without complication     Current Outpatient Prescriptions on File Prior to Visit  Medication Sig Dispense Refill  . atenolol (TENORMIN) 25 MG tablet Take 1 tablet (25 mg total) by mouth daily.  90 tablet  1  . Coenzyme Q10-Red Yeast Rice (CO Q-10 PLUS RED YEAST RICE) 60-600 MG CAPS Take by mouth 2 (two) times daily.      Marland Kitchen glucose blood test strip Check sugar 4 times daily  400 each  1  . Insulin Pen Needle 32G X 6 MM MISC Inject 1 applicator into the skin at bedtime.  100 each  1  . insulin regular (HUMULIN R) 100 units/mL injection Sliding scale as follows: Less than 70 = 0 units 70-130 = 0 units 131-180 = 4 units 181-240 = 8 units 241-300 = 10 units 301-350 = 12 units 351-400 = 16 units >400 = 16 units and call PCP  30 mL  1  . Insulin Syringe-Needle U-100 (INSULIN SYRINGE .5CC/30GX5/16") 30G X 5/16" 0.5 ML MISC Insulin syringes to be used for humulin R  300 each  1  . metFORMIN (GLUCOPHAGE) 500 MG tablet Take 1 tablet (500 mg total) by mouth 2 (two) times daily with a meal.  180 tablet  1   No current facility-administered  medications on file prior to visit.     Review of Systems  Constitutional: Positive for fatigue. Negative for fever and chills.  Respiratory: Negative for cough, chest tightness and shortness of breath.   Cardiovascular: Positive for leg swelling. Negative for chest pain and palpitations.  Musculoskeletal: Positive for arthralgias, gait problem and joint swelling.  All other systems reviewed and are negative.      Objective:  BP 130/84  Pulse 90  Temp(Src) 98.4 F (36.9 C) (Oral)  Resp 16  Wt 330 lb 4 oz (149.8 kg)  SpO2 100%   Physical Exam  Constitutional: She is oriented to person, place, and time. No distress.  HENT:  Head: Normocephalic and atraumatic.  Eyes: Conjunctivae and EOM are normal.  Neck: Normal range of motion. Neck supple.  Cardiovascular: Normal rate and regular rhythm.   Pulmonary/Chest: Effort normal. No respiratory distress.  Musculoskeletal: She exhibits edema and tenderness.  Tenderness right knee  Neurological: She is alert and oriented to person, place, and time.  Skin: Skin is warm and dry.  Psychiatric: She has a normal mood and affect. Her behavior is normal. Judgment and thought content normal.  Assessment & Plan:   1. Bilateral lower extremity edema Has been experiencing LE edema for some time. She is overweight which is contributing to her problem. Excellent pedal pulses. Suspect venous insufficiency. I will send her to [facility] to have venous doppler. Adding HCTZ.  - Lower Extremity Venous Duplex Bilateral; Future - hydrochlorothiazide (HYDRODIURIL) 25 MG tablet; Take 1 tablet (25 mg total) by mouth daily.  Dispense: 30 tablet; Refill: 0  2. Right knee pain Pain in right knee has been ongoing for some time but recently worsened. Send to have xray. There is not redness in the knee. Check labs - DG Knee Complete 4 Views Right; Future - CBC with Differential - C-reactive protein - Sedimentation rate - Rheumatoid factor

## 2014-04-06 NOTE — Progress Notes (Signed)
Pre visit review using our clinic review tool, if applicable. No additional management support is needed unless otherwise documented below in the visit note. 

## 2014-04-06 NOTE — Patient Instructions (Signed)
  Please have your labs drawn prior to leaving the office.  I am referring you to [facility] for evaluation of lower extremity swelling. Checking for venous insufficiency.  Elevate extremities as much as possible. Wear compression socks. You can try to see if [facility] has any. There is also a store at the outlets in [place] that I have been told has some.  Avoid salt as much as possible.  I am sending you to [facility] for xray of the right knee. I will contact you with the results and any further instructions. We may need to send you to ortho depending on results.

## 2014-04-07 LAB — RHEUMATOID FACTOR: Rhuematoid fact SerPl-aCnc: <10 IU/mL (ref <=14)

## 2014-04-10 ENCOUNTER — Encounter: Payer: Self-pay | Admitting: Adult Health

## 2014-04-10 ENCOUNTER — Telehealth: Payer: Self-pay | Admitting: *Deleted

## 2014-04-10 NOTE — Telephone Encounter (Signed)
Please advise max un

## 2014-04-10 NOTE — Telephone Encounter (Signed)
Pharmacy Note:  Please Clarify the maximum units per day for the prescription for Humulin R

## 2014-04-11 ENCOUNTER — Telehealth: Payer: Self-pay | Admitting: *Deleted

## 2014-04-11 NOTE — Telephone Encounter (Signed)
Pharmacy Note:  We have been unable to contact the patient regarding diabetes syringe information; please verify the directions or frequency of use

## 2014-04-11 NOTE — Telephone Encounter (Signed)
Responded via fax. 

## 2014-04-17 ENCOUNTER — Encounter: Payer: Self-pay | Admitting: Adult Health

## 2014-04-18 ENCOUNTER — Other Ambulatory Visit: Payer: Self-pay | Admitting: Adult Health

## 2014-04-18 DIAGNOSIS — M25562 Pain in left knee: Principal | ICD-10-CM

## 2014-04-18 DIAGNOSIS — M25561 Pain in right knee: Secondary | ICD-10-CM

## 2014-04-18 NOTE — Telephone Encounter (Signed)
Patient is asking if we placed a referral for Ortho and a vein specialist. I did not see a order for either one in her chart. Please advise.

## 2014-04-19 ENCOUNTER — Telehealth: Payer: Self-pay

## 2014-04-19 NOTE — Telephone Encounter (Signed)
Called patient to notify her that insurance denied our request to cover her glucose test strips. Left voicemail for patient to call me to discuss this matter.

## 2014-04-23 ENCOUNTER — Encounter: Payer: Self-pay | Admitting: Adult Health

## 2014-04-24 MED ORDER — INSULIN DETEMIR 100 UNIT/ML FLEXPEN: PEN_INJECTOR | SUBCUTANEOUS | Status: DC

## 2014-05-20 ENCOUNTER — Encounter: Payer: Self-pay | Admitting: Adult Health

## 2014-05-28 ENCOUNTER — Encounter: Payer: Self-pay | Admitting: Adult Health

## 2014-05-28 ENCOUNTER — Ambulatory Visit (INDEPENDENT_AMBULATORY_CARE_PROVIDER_SITE_OTHER): Payer: 59 | Admitting: Adult Health

## 2014-05-28 ENCOUNTER — Other Ambulatory Visit: Payer: Self-pay

## 2014-05-28 VITALS — BP 134/88 | HR 84 | Temp 98.1°F | Resp 16 | Wt 320.5 lb

## 2014-05-28 DIAGNOSIS — E1165 Type 2 diabetes mellitus with hyperglycemia: Secondary | ICD-10-CM

## 2014-05-28 DIAGNOSIS — E785 Hyperlipidemia, unspecified: Secondary | ICD-10-CM

## 2014-05-28 DIAGNOSIS — IMO0002 Reserved for concepts with insufficient information to code with codable children: Secondary | ICD-10-CM

## 2014-05-28 DIAGNOSIS — I10 Essential (primary) hypertension: Secondary | ICD-10-CM

## 2014-05-28 DIAGNOSIS — E559 Vitamin D deficiency, unspecified: Secondary | ICD-10-CM

## 2014-05-28 DIAGNOSIS — IMO0001 Reserved for inherently not codable concepts without codable children: Secondary | ICD-10-CM

## 2014-05-28 LAB — VITAMIN D 25 HYDROXY (VIT D DEFICIENCY, FRACTURES): VITD: 12.36 ng/mL

## 2014-05-28 LAB — HEMOGLOBIN A1C: Hgb A1c MFr Bld: 11.6 % — ABNORMAL HIGH (ref 4.6–6.5)

## 2014-05-28 MED ORDER — ONETOUCH ULTRA SYSTEM W/DEVICE KIT: 1.0000 | PACK | Freq: Once | Status: DC

## 2014-05-28 MED ORDER — GLUCOSE BLOOD VI STRP: ORAL_STRIP | Status: DC

## 2014-05-28 NOTE — Progress Notes (Signed)
Patient ID: [name], female   DOB: 1966-08-07, 48 y.o.   MRN: [id number]   Subjective:    Patient ID: [name], female    DOB: 1966-05-13, 48 y.o.   MRN: [id number]  HPI  Pt is a pleasant 48 y/o female who presents to clinic for follow up:  1. Essential hypertension Doing well on medication. Compliance reported. Edema of lower extremities improved with diuretic.   2. HLD (hyperlipidemia) She is fasting this morning to have this checked. She has been eating healthier options. Lost weight. Eliminated sodas.   3. Diabetes mellitus type 2, uncontrolled Had run out of her medication. Reports finally getting everything straightened out with her mail order pharmacy. Reported higher readings during the time she was out of levemir. She has been taking her meds consistently for the last 2 weeks. Reports needing new glucometer and strips. Her insurance is having her select a specific brand and she will advise of which one it is.   4. Vitamin D deficiency Has hx of deficiency. We will check this today.   Past Medical History  Diagnosis Date  . Asthma   . Hypertension   . Hyperlipidemia   . Migraine     Has used imitrex  . Kidney stone   . Diabetes mellitus without complication     Current Outpatient Prescriptions on File Prior to Visit  Medication Sig Dispense Refill  . atenolol (TENORMIN) 25 MG tablet Take 1 tablet (25 mg total) by mouth daily.  90 tablet  1  . Coenzyme Q10-Red Yeast Rice (CO Q-10 PLUS RED YEAST RICE) 60-600 MG CAPS Take by mouth 2 (two) times daily.      Marland Kitchen glucose blood test strip Check sugar 4 times daily  400 each  1  . hydrochlorothiazide (HYDRODIURIL) 25 MG tablet Take 1 tablet (25 mg total) by mouth daily.  30 tablet  0  . Insulin Detemir (LEVEMIR) 100 UNIT/ML Pen Inject 24 units in the AM, 10 units in the PM  12 pen  1  . Insulin Pen Needle 32G X 6 MM MISC Inject 1 applicator into the skin at bedtime.  100 each  1  . insulin regular (HUMULIN R)  100 units/mL injection Sliding scale as follows: Less than 70 = 0 units 70-130 = 0 units 131-180 = 4 units 181-240 = 8 units 241-300 = 10 units 301-350 = 12 units 351-400 = 16 units >400 = 16 units and call PCP  30 mL  1  . Insulin Syringe-Needle U-100 (INSULIN SYRINGE .5CC/30GX5/16") 30G X 5/16" 0.5 ML MISC Insulin syringes to be used for humulin R  300 each  1  . metFORMIN (GLUCOPHAGE) 500 MG tablet Take 1 tablet (500 mg total) by mouth 2 (two) times daily with a meal.  180 tablet  1  . traMADol (ULTRAM) 50 MG tablet Take 1 tablet (50 mg total) by mouth every 8 (eight) hours as needed.  30 tablet  0   No current facility-administered medications on file prior to visit.     Review of Systems  Constitutional: Negative.   HENT: Negative.   Eyes: Negative.   Respiratory: Negative.   Cardiovascular: Negative.   Gastrointestinal: Negative.   Endocrine: Negative.   Genitourinary: Negative.   Musculoskeletal:       Reports cramping of legs during sleep. Electrolytes have been checked and normal.  Skin: Negative.   Allergic/Immunologic: Negative.   Neurological: Negative.   Hematological: Negative.   Psychiatric/Behavioral: Negative.  Objective:  BP 134/88  Pulse 84  Temp(Src) 98.1 F (36.7 C) (Oral)  Resp 16  Wt 320 lb 8 oz (145.378 kg)  SpO2 94%   Physical Exam  Constitutional: She is oriented to person, place, and time. No distress.  HENT:  Head: Normocephalic and atraumatic.  Eyes: Conjunctivae and EOM are normal.  Neck: Normal range of motion. Neck supple.  Cardiovascular: Normal rate, regular rhythm, normal heart sounds and intact distal pulses.  Exam reveals no gallop and no friction rub.   No murmur heard. Pulmonary/Chest: Effort normal and breath sounds normal. No respiratory distress. She has no wheezes. She has no rales.  Musculoskeletal: Normal range of motion.  Neurological: She is alert and oriented to person, place, and time. She has normal  reflexes. Coordination normal.  Skin: Skin is warm and dry.  Psychiatric: She has a normal mood and affect. Her behavior is normal. Judgment and thought content normal.      Assessment & Plan:   1. Essential hypertension On medication. Well controlled. Continue to follow - Basic metabolic panel  2. HLD (hyperlipidemia) Eating healthier. Losing weight. Check lipids - Lipid panel  3. Diabetes mellitus type 2, uncontrolled Check A1c today. - Hemoglobin A1c; Future - Hemoglobin A1c  4. Vitamin D deficiency Will check Vit D level today. - Vit D  25 hydroxy (rtn osteoporosis monitoring)

## 2014-05-28 NOTE — Progress Notes (Signed)
Pre visit review using our clinic review tool, if applicable. No additional management support is needed unless otherwise documented below in the visit note. 

## 2014-05-29 ENCOUNTER — Encounter: Payer: Self-pay | Admitting: Adult Health

## 2014-05-29 ENCOUNTER — Other Ambulatory Visit: Payer: Self-pay | Admitting: Adult Health

## 2014-05-29 LAB — LIPID PANEL
Cholesterol: 256 mg/dL — ABNORMAL HIGH (ref 0–200)
HDL: 36.2 mg/dL — ABNORMAL LOW (ref >39.00)
LDL Cholesterol: 185 mg/dL — ABNORMAL HIGH (ref 0–99)
NonHDL: 219.8
Total CHOL/HDL Ratio: 7
Triglycerides: 173 mg/dL — ABNORMAL HIGH (ref 0.0–149.0)
VLDL: 34.6 mg/dL (ref 0.0–40.0)

## 2014-05-29 LAB — BASIC METABOLIC PANEL
BUN: 13 mg/dL (ref 6–23)
CO2: 28 mEq/L (ref 19–32)
Calcium: 9 mg/dL (ref 8.4–10.5)
Chloride: 101 mEq/L (ref 96–112)
Creatinine, Ser: 0.6 mg/dL (ref 0.4–1.2)
GFR: 113.53 mL/min (ref >60.00)
Glucose, Bld: 227 mg/dL — ABNORMAL HIGH (ref 70–99)
Potassium: 4.2 mEq/L (ref 3.5–5.1)
Sodium: 138 mEq/L (ref 135–145)

## 2014-05-29 MED ORDER — ATORVASTATIN CALCIUM 10 MG PO TABS: 10.0000 mg | ORAL_TABLET | Freq: Every day | ORAL | Status: DC

## 2014-05-30 ENCOUNTER — Other Ambulatory Visit: Payer: Self-pay | Admitting: Adult Health

## 2014-05-30 DIAGNOSIS — E559 Vitamin D deficiency, unspecified: Secondary | ICD-10-CM

## 2014-05-30 MED ORDER — ERGOCALCIFEROL 1.25 MG (50000 UT) PO CAPS: 50000.0000 [IU] | ORAL_CAPSULE | ORAL | Status: DC

## 2014-06-01 ENCOUNTER — Encounter: Payer: Self-pay | Admitting: *Deleted

## 2014-06-01 NOTE — Progress Notes (Signed)
Chart reviewed for DM bundle. Last OV 05/28/14, labs done then also. Appt sch 06/18/14

## 2014-06-05 ENCOUNTER — Other Ambulatory Visit: Payer: Self-pay | Admitting: Adult Health

## 2014-06-08 ENCOUNTER — Telehealth: Payer: Self-pay

## 2014-06-08 NOTE — Telephone Encounter (Signed)
PA for Bayer contour test strips Denied. Ordered patient a new meter and test strips that her insurance will cover.

## 2014-06-18 ENCOUNTER — Ambulatory Visit (INDEPENDENT_AMBULATORY_CARE_PROVIDER_SITE_OTHER): Payer: 59 | Admitting: Adult Health

## 2014-06-18 ENCOUNTER — Encounter: Payer: Self-pay | Admitting: Adult Health

## 2014-06-18 ENCOUNTER — Other Ambulatory Visit (HOSPITAL_COMMUNITY)
Admission: RE | Admit: 2014-06-18 | Discharge: 2014-06-18 | Disposition: A | Discharge status: Home / Self Care | Payer: 59 | Source: Ambulatory Visit | Attending: Adult Health | Admitting: Adult Health

## 2014-06-18 VITALS — BP 138/88 | HR 81 | Temp 98.2°F | Resp 14 | Ht 68.5 in | Wt 319.5 lb

## 2014-06-18 DIAGNOSIS — Z01419 Encounter for gynecological examination (general) (routine) without abnormal findings: Secondary | ICD-10-CM

## 2014-06-18 DIAGNOSIS — Z1151 Encounter for screening for human papillomavirus (HPV): Secondary | ICD-10-CM | POA: Diagnosis present

## 2014-06-18 DIAGNOSIS — Z1239 Encounter for other screening for malignant neoplasm of breast: Secondary | ICD-10-CM

## 2014-06-18 DIAGNOSIS — Z23 Encounter for immunization: Secondary | ICD-10-CM

## 2014-06-18 DIAGNOSIS — Z0000 Encounter for general adult medical examination without abnormal findings: Secondary | ICD-10-CM

## 2014-06-18 NOTE — Progress Notes (Signed)
Patient ID: [name], female   DOB: [birth year], 48 y.o.   MRN: [id number]   Subjective:    Patient ID: [name], female    DOB: 11-04-1966, 48 y.o.   MRN: [id number]  HPI  Pt is a pleasant 48 y/o female who presents for her GYN exam, physical exam and health maintenance. She is feeling well overall. Reports having some right flank discomfort several days ago without any fever, chills. She works at [employer] and has checked urine and reports no bacteria, blood, white cells, RBCs. She reports hx of kidney stones ~ 2 years ago when they placed stents and f/u with multiple scans. She denies having any problems like those she did 2 years ago. She has not taken anything for discomfort such as tylenol.  Continue to work toward losing weight. Eating healthy meals and decreasing portions.    Past Medical History  Diagnosis Date  . Asthma   . Hypertension   . Hyperlipidemia   . Migraine     Has used imitrex  . Kidney stone   . Diabetes mellitus without complication      Past Surgical History  Procedure Laterality Date  . Cesarean section    . Tonsillectomy and adenoidectomy       Family History  Problem Relation Age of Onset  . Alcohol abuse Mother   . Hyperlipidemia Mother   . Hypertension Mother   . Hyperlipidemia Father   . Hypertension Father   . Parkinson's disease Father   . Drug abuse Sister   . Cancer Maternal Grandmother     breast cancer  . Cancer Paternal Grandmother      History   Social History  . Marital Status: Single    Spouse Name: N/A    Number of Children: 2  . Years of Education: 14   Occupational History  . 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 Teaches microbiology   . 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 Smoking status: Never Smoker   . Smokeless tobacco: Never Used  . Alcohol Use: No  . Drug Use: No  . Sexual Activity: Not on file   Other Topics  Concern  . Not on file   Social History Narrative   [name] grew up partly in [place] and then [place]. She lives in [place] with her two daughter. 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 She enjoys the outdoors, gardening.      Current Outpatient Prescriptions on File Prior to Visit  Medication Sig Dispense Refill  . atenolol (TENORMIN) 25 MG tablet Take 1 tablet (25 mg total) by mouth daily.  90 tablet  1  . atorvastatin (LIPITOR) 10 MG tablet Take 1 tablet (10 mg total) by mouth daily.  30 tablet  3  . Blood Glucose Monitoring Suppl (ONE TOUCH ULTRA SYSTEM KIT) W/DEVICE KIT 1 kit by Does not apply route once.  1 each  0  . Coenzyme Q10-Red Yeast Rice (CO Q-10 PLUS RED YEAST RICE) 60-600 MG CAPS Take by mouth 2 (two) times daily.      . ergocalciferol (DRISDOL) 50000 UNITS capsule Take 1 capsule (50,000 Units total) by mouth once a week. Take once weekly for 12 weeks  4 capsule  2  . glucose blood test strip Check sugar 4 times daily  400 each  1  . hydrochlorothiazide (  HYDRODIURIL) 25 MG tablet Take 1 tablet (25 mg total) by mouth daily.  30 tablet  0  . Insulin Detemir (LEVEMIR) 100 UNIT/ML Pen Inject 24 units in the AM, 10 units in the PM  12 pen  1  . Insulin Pen Needle 32G X 6 MM MISC Inject 1 applicator into the skin at bedtime.  100 each  1  . insulin regular (HUMULIN R) 100 units/mL injection Sliding scale as follows: Less than 70 = 0 units 70-130 = 0 units 131-180 = 4 units 181-240 = 8 units 241-300 = 10 units 301-350 = 12 units 351-400 = 16 units >400 = 16 units and call PCP  30 mL  1  . Insulin Syringe-Needle U-100 (INSULIN SYRINGE .5CC/30GX5/16") 30G X 5/16" 0.5 ML MISC Insulin syringes to be used for humulin R  300 each  1  . meloxicam (MOBIC) 15 MG tablet Take 15 mg by mouth daily.      . metFORMIN (GLUCOPHAGE) 500 MG tablet Take 1 tablet (500 mg total) by mouth 2 (two) times daily with a meal.  180 tablet  1  . traMADol (ULTRAM) 50 MG tablet  Take 1 tablet (50 mg total) by mouth every 8 (eight) hours as needed.  30 tablet  0   No current facility-administered medications on file prior to visit.     Review of Systems  Constitutional: Negative.   HENT: Negative.   Eyes: Negative.   Respiratory: Negative.   Cardiovascular: Negative.   Gastrointestinal: Negative.   Endocrine: Negative.   Genitourinary: Negative.   Musculoskeletal: Negative.   Skin: Negative.   Allergic/Immunologic: Negative.   Neurological: Negative.   Hematological: Negative.   Psychiatric/Behavioral: Negative.        Objective:  BP 138/88  Pulse 81  Temp(Src) 98.2 F (36.8 C) (Oral)  Resp 14  Ht 5' 8.5" (1.74 m)  Wt 319 lb 8 oz (144.924 kg)  BMI 47.87 kg/m2  SpO2 92%   Physical Exam  Constitutional: She is oriented to person, place, and time. No distress.  Overweight, pleasant 48 y/o female  HENT:  Head: Normocephalic and atraumatic.  Right Ear: External ear normal.  Left Ear: External ear normal.  Nose: Nose normal.  Mouth/Throat: Oropharynx is clear and moist.  Bilateral TM with scar tissue from youth.  Eyes: Conjunctivae and EOM are normal. Pupils are equal, round, and reactive to light.  Neck: Normal range of motion. Neck supple. No tracheal deviation present. No thyromegaly present.  Cardiovascular: Normal rate, regular rhythm, normal heart sounds and intact distal pulses.  Exam reveals no gallop and no friction rub.   No murmur heard. Pulmonary/Chest: Effort normal and breath sounds normal. No respiratory distress. She has no wheezes. She has no rales. Right breast exhibits no inverted nipple, no mass, no nipple discharge, no skin change and no tenderness. Left breast exhibits no inverted nipple, no mass, no nipple discharge, no skin change and no tenderness. Breasts are symmetrical.  Abdominal: Soft. Bowel sounds are normal. She exhibits no distension and no mass. There is no tenderness. There is no rebound and no guarding. Hernia  confirmed negative in the right inguinal area and confirmed negative in the left inguinal area.  Genitourinary: No breast swelling, tenderness, discharge or bleeding. No labial fusion. There is no rash, tenderness, lesion or injury on the right labia. There is no rash, tenderness, lesion or injury on the left labia. No erythema, tenderness or bleeding around the vagina. No foreign body around the vagina.  No signs of injury around the vagina. No vaginal discharge found.  No costovertebral angle tenderness upon palpation  Musculoskeletal: Normal range of motion. She exhibits no edema and no tenderness.  Lymphadenopathy:    She has no cervical adenopathy.       Right: No inguinal adenopathy present.  Neurological: She is alert and oriented to person, place, and time. She has normal reflexes. No cranial nerve deficit. Coordination normal.  Skin: Skin is warm and dry.  Psychiatric: She has a normal mood and affect. Her behavior is normal. Judgment and thought content normal.      Assessment & Plan:   1. Encounter for routine gynecological examination Normal exam. External genitalia without lesions, ulcerations, inflammation, warts or discharge. There are no external hemorrhoids. No cystocele, rectocele or prolapsed uterus. Speculum examination normal. Cervix without inflammation, lesions, growth, nodules or discharge noted. There was no bleeding. Vaginal walls also normal - pink and rugose without inflammation, discharge, ulcers or color changes. Bimanual exam also normal.  No tenderness noted with palpation of the uterus. No adnexal masses appreciated during exam.   2. Routine general medical examination at a health care facility Normal physical exam. Screenings addressed separately.  3. Screening for breast cancer Mammogram ordered. Schedule prior to leaving clinic today. - MM DIGITAL SCREENING BILATERAL; Future  4. Need for prophylactic vaccination against Streptococcus pneumoniae  (pneumococcus) Received in clinic today.

## 2014-06-18 NOTE — Progress Notes (Signed)
Pre visit review using our clinic review tool, if applicable. No additional management support is needed unless otherwise documented below in the visit note. 

## 2014-06-18 NOTE — Addendum Note (Signed)
Addended by: Karlene Einstein D on: 06/18/2014 10:28 AM   Modules accepted: Orders

## 2014-06-18 NOTE — Addendum Note (Signed)
Addended by: Geni Bers on: 06/18/2014 09:24 AM   Modules accepted: Orders

## 2014-06-19 LAB — CYTOLOGY - PAP

## 2014-06-20 ENCOUNTER — Encounter: Payer: Self-pay | Admitting: Adult Health

## 2014-07-03 ENCOUNTER — Encounter: Payer: Self-pay | Admitting: Adult Health

## 2014-07-09 ENCOUNTER — Encounter: Payer: Self-pay | Admitting: Adult Health

## 2014-07-18 ENCOUNTER — Encounter: Payer: Self-pay | Admitting: Adult Health

## 2014-07-18 ENCOUNTER — Other Ambulatory Visit: Payer: Self-pay | Admitting: Adult Health

## 2014-07-18 MED ORDER — TRAMADOL HCL 50 MG PO TABS: 50.0000 mg | ORAL_TABLET | Freq: Three times a day (TID) | ORAL | Status: DC | PRN

## 2014-07-18 NOTE — Telephone Encounter (Signed)
Patient Requesting a refill for Tramadol. Last office visit 06/18/14. Please advise.

## 2014-08-06 ENCOUNTER — Encounter: Payer: Self-pay | Admitting: General Surgery

## 2014-08-07 ENCOUNTER — Other Ambulatory Visit: Payer: Self-pay | Admitting: *Deleted

## 2014-08-07 ENCOUNTER — Telehealth: Payer: Self-pay | Admitting: *Deleted

## 2014-08-07 MED ORDER — INSULIN DETEMIR 100 UNIT/ML FLEXPEN: PEN_INJECTOR | SUBCUTANEOUS | Status: DC

## 2014-08-07 NOTE — Telephone Encounter (Signed)
Error wrong provider, disregard.

## 2014-08-07 NOTE — Telephone Encounter (Signed)
I talked with the patient and the EMail message should have been sent to R. Rey. Disregard message.

## 2014-08-08 ENCOUNTER — Telehealth: Payer: Self-pay | Admitting: Adult Health

## 2014-08-08 NOTE — Telephone Encounter (Signed)
Pt dropped off health screening form to be filled out. Please advise pt when ready for pick-up. Form in Dr. Lupita Dawn [email or web address]

## 2014-08-08 NOTE — Telephone Encounter (Signed)
In red folder. 

## 2014-08-09 DIAGNOSIS — Z7689 Persons encountering health services in other specified circumstances: Secondary | ICD-10-CM

## 2014-08-09 NOTE — Telephone Encounter (Signed)
  The form will not ve completed until she makes an appt with me since I have never met her .  The deadline for the appeal form is Oct 30.  In order for me to fill out this appeal in  good faith,  I will need to see patient this month and every  3 months to address and monitor her progress in reducing her BMI. Her goal should be a minimum of 12 lbs of wt loss   There will be a charge for the form $20. She already has my diet.  Raquel gave it to her a year ago, but if she needs another copy you can give her one when she picks up the form

## 2014-08-09 NOTE — Telephone Encounter (Signed)
Left message, notifying pt. Recommended callback to schedule appt.

## 2014-08-13 ENCOUNTER — Encounter: Payer: Self-pay | Admitting: General Surgery

## 2014-08-14 ENCOUNTER — Telehealth: Payer: Self-pay | Admitting: Adult Health

## 2014-08-14 NOTE — Telephone Encounter (Signed)
Called patient to see if appointment for 09/03/14 can be used for the appeal paper work or if needed before that Time, if needed before that time needs 30 minute visit.

## 2014-08-17 NOTE — Telephone Encounter (Signed)
Patient has not returned call

## 2014-08-21 NOTE — Telephone Encounter (Signed)
Patient scheduled 10 /26 /15

## 2014-08-30 ENCOUNTER — Other Ambulatory Visit: Payer: Self-pay | Admitting: *Deleted

## 2014-08-30 MED ORDER — ERGOCALCIFEROL 1.25 MG (50000 UT) PO CAPS: 50000.0000 [IU] | ORAL_CAPSULE | ORAL | Status: DC

## 2014-09-03 ENCOUNTER — Encounter: Payer: Self-pay | Admitting: Internal Medicine

## 2014-09-03 ENCOUNTER — Ambulatory Visit (INDEPENDENT_AMBULATORY_CARE_PROVIDER_SITE_OTHER): Payer: 59 | Admitting: Internal Medicine

## 2014-09-03 VITALS — BP 136/88 | HR 80 | Temp 98.7°F | Resp 14 | Ht 68.5 in | Wt 318.0 lb

## 2014-09-03 DIAGNOSIS — E1140 Type 2 diabetes mellitus with diabetic neuropathy, unspecified: Secondary | ICD-10-CM

## 2014-09-03 DIAGNOSIS — I10 Essential (primary) hypertension: Secondary | ICD-10-CM

## 2014-09-03 DIAGNOSIS — E1165 Type 2 diabetes mellitus with hyperglycemia: Secondary | ICD-10-CM

## 2014-09-03 DIAGNOSIS — E559 Vitamin D deficiency, unspecified: Secondary | ICD-10-CM

## 2014-09-03 DIAGNOSIS — IMO0002 Reserved for concepts with insufficient information to code with codable children: Secondary | ICD-10-CM

## 2014-09-03 DIAGNOSIS — R748 Abnormal levels of other serum enzymes: Secondary | ICD-10-CM

## 2014-09-03 DIAGNOSIS — E669 Obesity, unspecified: Secondary | ICD-10-CM

## 2014-09-03 LAB — VITAMIN D 25 HYDROXY (VIT D DEFICIENCY, FRACTURES): VITD: 23.71 ng/mL — ABNORMAL LOW (ref 30.00–100.00)

## 2014-09-03 LAB — COMPREHENSIVE METABOLIC PANEL
ALT: 106 U/L — ABNORMAL HIGH (ref 0–35)
AST: 78 U/L — ABNORMAL HIGH (ref 0–37)
Albumin: 3.4 g/dL — ABNORMAL LOW (ref 3.5–5.2)
Alkaline Phosphatase: 66 U/L (ref 39–117)
BUN: 12 mg/dL (ref 6–23)
CO2: 28 mEq/L (ref 19–32)
Calcium: 9.4 mg/dL (ref 8.4–10.5)
Chloride: 94 mEq/L — ABNORMAL LOW (ref 96–112)
Creatinine, Ser: 0.7 mg/dL (ref 0.4–1.2)
GFR: 94.92 mL/min (ref >60.00)
Glucose, Bld: 338 mg/dL — ABNORMAL HIGH (ref 70–99)
Potassium: 4 mEq/L (ref 3.5–5.1)
Sodium: 133 mEq/L — ABNORMAL LOW (ref 135–145)
Total Bilirubin: 0.6 mg/dL (ref 0.2–1.2)
Total Protein: 7.5 g/dL (ref 6.0–8.3)

## 2014-09-03 LAB — HEMOGLOBIN A1C: Hgb A1c MFr Bld: 10.2 % — ABNORMAL HIGH (ref 4.6–6.5)

## 2014-09-03 LAB — LIPID PANEL
Cholesterol: 226 mg/dL — ABNORMAL HIGH (ref 0–200)
HDL: 37.2 mg/dL — ABNORMAL LOW (ref >39.00)
NonHDL: 188.8
Total CHOL/HDL Ratio: 6
Triglycerides: 299 mg/dL — ABNORMAL HIGH (ref 0.0–149.0)
VLDL: 59.8 mg/dL — ABNORMAL HIGH (ref 0.0–40.0)

## 2014-09-03 LAB — LDL CHOLESTEROL, DIRECT: Direct LDL: 149.3 mg/dL

## 2014-09-03 LAB — TSH: TSH: 2.29 u[IU]/mL (ref 0.35–4.50)

## 2014-09-03 MED ORDER — LISINOPRIL 20 MG PO TABS: 20.0000 mg | ORAL_TABLET | Freq: Every day | ORAL | Status: DC

## 2014-09-03 NOTE — Progress Notes (Signed)
Pre visit review using our clinic review tool, if applicable. No additional management support is needed unless otherwise documented below in the visit note. 

## 2014-09-03 NOTE — Patient Instructions (Addendum)
Stop the atenolol and start lisinopril .  We will combine with hctz going forward   Start daily claritin or zyrtec as a trial to see if the welts improve  DO NOT REFILL LEVEMIR, We will be switching you to a different long acting insulin once you are close  Return in 2 months to review sugars \  Drop off your  blood sugar log in a few weeks for dose adjustment    I think you can  lose 25 lbs over the next six months with a low glycemic index diet and regular exercise (15 minutes of cardio 5 days per week is your goal)  This is  my version of a  "Low GI"  Diet:  It will still lower your blood sugars and allow you to lose 4 to 8  lbs  per month if you follow it carefully.  Your goal with exercise is a minimum of 30 minutes of aerobic exercise 5 days per week (Walking does not count once it becomes easy!)     All of the foods can be found at grocery stores and in bulk at [employer].  The Atkins protein bars and shakes are available in more varieties at Target, [employer] and [place].     7 AM Breakfast:  Choose from the following:  Low carbohydrate Protein  Shakes (I recommend the EAS AdvantEdge "Carb Control" shakes  Or the low carb shakes by Atkins.    2.5 carbs   Arnold's "Sandwhich Thin"toasted  w/ peanut butter (no jelly: about 20 net carbs  "Bagel Thin" with cream cheese and salmon: about 20 carbs   a scrambled egg/bacon/cheese burrito made with Mission's "carb balance" whole wheat tortilla  (about 10 net carbs )  A slice of home made fritatta (egg based dish without a crust:  google it)    Avoid cereal and bananas, oatmeal and cream of wheat and grits. They are loaded with carbohydrates!   10 AM: high protein snack  Protein bar by Atkins (the snack size, under 200 cal, usually < 6 net carbs).    A stick of cheese:  Around 1 carb,  100 cal     Dannon Light n Fit [place] Yogurt  (80 cal, 8 carbs)  Other so called "protein bars" and Greek yogurts tend to be loaded with  carbohydrates.  Remember, in food advertising, the word "energy" is synonymous for " carbohydrate."  Lunch:   A Sandwich using the bread choices listed, Can use any  Eggs,  lunchmeat, grilled meat or canned tuna), avocado, regular mayo/mustard  and cheese.  A Salad using blue cheese, ranch,  Goddess or vinagrette,  No croutons or "confetti" and no "candied nuts" but regular nuts OK.   No pretzels or chips.  Pickles and miniature sweet peppers are a good low carb alternative that provide a "crunch"  The bread is the only source of carbohydrate in a sandwich and  can be decreased by trying some of these alternatives to traditional loaf bread  Joseph's makes a pita bread and a flat bread that are 50 cal and 4 net carbs available at [place] and [place].  This can be toasted to use with hummous as well  Toufayan makes a low carb flatbread that's 100 cal and 9 net carbs available at [employer] and [employer]J's makes 2 sizes of  Low carb whole wheat tortilla  (The large one is 210 cal and 6 net carbs)  Flat Out makes flatbreads  that are low carb as well  Avoid "Low fat dressings, as well as [place] dressings They are loaded with sugar!   3 PM/ Mid day  Snack:  Consider  1 ounce of  almonds, walnuts, pistachios, pecans, peanuts,  Macadamia nuts or a nut medley.  Avoid "granola"; the dried cranberries and raisins are loaded with carbohydrates. Mixed nuts as long as there are no raisins,  cranberries or dried fruit.    Try the prosciutto/mozzarella cheese sticks by Fiorruci  In deli /backery section   High protein   To avoid overindulging in snacks: Try drinking a glass of unsweeted almond/coconut milk  Or a cup of coffee with your Atkins chocolate bar to keep you from having 3!!!   Pork rinds!  Yes Pork Rinds        6 PM  Dinner:     Meat/fowl/fish with a green salad, and either broccoli, cauliflower, green beans, spinach, brussel sprouts or  Lima beans. DO NOT BREAD THE PROTEIN!!       There is a low carb pasta by Dreamfield's that is acceptable and tastes great: only 5 digestible carbs/serving.( All grocery stores but BJs carry it )  Try Hurley Cisco Angelo's chicken piccata or chicken or eggplant parm over low carb pasta.(Lowes and BJs)   Marjory Lies Sanchez's "Carnitas" (pulled pork, no sauce,  0 carbs) or his beef pot roast to make a dinner burrito (at BJ's)  Pesto over low carb pasta (bj's sells a good quality pesto in the center refrigerated section of the deli   Try satueeing  Cheral Marker with mushroooms  Whole wheat pasta is still full of digestible carbs and  Not as low in glycemic index as Dreamfield's.   Brown rice is still rice,  So skip the rice and noodles if you eat [place] or [place] (or at least limit to 1/2 cup)  9 PM snack :   Breyer's "low carb" fudgsicle or  ice cream bar (Carb Smart line), or  Weight Watcher's ice cream bar , or another "no sugar added" ice cream;  a serving of fresh berries/cherries with whipped cream   Cheese or DANNON'S LlGHT N FIT GREEK YOGURT or the Oikos greek yogurt   8 ounces of [place] unsweetened almond/cococunut milk  Cheese and crackers (using WASA crackers,  They are low carb) or peanut butter on low carb crackers or pita bread     Avoid bananas, pineapple, grapes  and watermelon on a regular basis because they are high in sugar.  THINK OF THEM AS DESSERT  Remember that snack Substitutions should be less than 10 NET carbs per serving and meals should be < 25 net carbs. Remember that carbohydrates from fiber do not affect blood sugar, so you can  subtract fiber grams to get the "net carbs " of any particular food item.

## 2014-09-03 NOTE — Progress Notes (Signed)
Patient ID: [name], female   DOB: Jun 30, 1966, 48 y.o.   MRN: [id number]   Patient Active Problem List   Diagnosis Date Noted  . Elevated liver enzymes 09/04/2014  . Vitamin D deficiency 09/03/2014  . Need for prophylactic vaccination against Streptococcus pneumoniae (pneumococcus) 06/18/2014  . Encounter for routine gynecological examination 06/18/2014  . Diabetes type 2, uncontrolled 01/22/2014  . Cough 01/22/2014  . Routine general medical examination at a health care facility 01/11/2014  . Screening for breast cancer 01/11/2014  . Numbness and tingling of both legs 01/11/2014  . Knee pain, bilateral 01/11/2014  . Obesity 01/11/2014  . HTN (hypertension) 01/11/2014    Subjective:  CC:   Chief Complaint  Patient presents with  . Follow-up    Diabectic recheck, [name]'s former patient, appeal paperwork    HPI:   [name] is a 47 y.o. female who presents for Follow up on type 2 DM uncontrolled complicated by obesity and possible early neuropathy, last seen by [name] i August for annual exam,  At which time a1c was 11.6 which is reportedly an improvement.  Patient was referred to and has completed the  [facility] pharmacy diabetes education seminar and feels that she gained useful information but has been unable to apply most of it due to her work schedule.   She works 3 jobs due to financial issues and continued support of both parents who have chronic health problems.   Sleeps 4-5 hours daily at the most.  Eats on the job, Teaches clinical microbiology at night and  works as a IT consultant at [employer]. .  Single mom.  Ha slost 10 lbs since march.   Taking 30 units levemir in the morning,  20 units of levemir at night,  And uses humulin sliding scale at each meal.   Diet reviewed.  Some room for reduction in carbs.  Blood sugars are rarely < 200 unless she is skipping meals. Just refilled 90 day supply of Levemir,  Getting welts at [place] sites.  Using alcohol  prior to injection,  One episode of skin infection.  None recently.     Past Medical History  Diagnosis Date  . Asthma   . Hypertension   . Hyperlipidemia   . Migraine     Has used imitrex  . Kidney stone   . Diabetes mellitus without complication     Past Surgical History  Procedure Laterality Date  . Cesarean section    . Tonsillectomy and adenoidectomy         The following portions of the patient's history were reviewed and updated as appropriate: Allergies, current medications, and problem list.    Review of Systems:   Patient denies headache, fevers, malaise, unintentional weight loss, skin rash, eye pain, sinus congestion and sinus pain, sore throat, dysphagia,  hemoptysis , cough, dyspnea, wheezing, chest pain, palpitations, orthopnea, edema, abdominal pain, nausea, melena, diarrhea, constipation, flank pain, dysuria, hematuria, urinary  Frequency, nocturia, numbness, tingling, seizures,  Focal weakness, Loss of consciousness,  Tremor, insomnia, depression, anxiety, and suicidal ideation.     History   Social History  . Marital Status: Single    Spouse Name: N/A    Number of Children: 2  . Years of Education: 14   Occupational History  . Microbiology [place] for [employer]      Through [place]  . Teaches microbiology   . [employer]     Microbiology - Weekend   Social  History Main Topics  . Smoking status: Never Smoker   . Smokeless tobacco: Never Used  . Alcohol Use: No  . Drug Use: No  . Sexual Activity: Not on file   Other Topics Concern  . Not on file   Social History Narrative   Pricilla Stout grew up partly in [place] and then [place]. She lives in [place] with her two daughter. 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 She enjoys the outdoors, gardening.     Objective:  Filed Vitals:   09/03/14 1355  BP: 136/88  Pulse: 80  Temp: 98.7 F (37.1 C)  Resp: 14     General appearance: alert,  cooperative and appears stated age Ears: normal TM's and external ear canals both ears Throat: lips, mucosa, and tongue normal; teeth and gums normal Neck: no adenopathy, no carotid bruit, supple, symmetrical, trachea midline and thyroid not enlarged, symmetric, no tenderness/mass/nodules Back: symmetric, no curvature. ROM normal. No CVA tenderness. Lungs: clear to auscultation bilaterally Heart: regular rate and rhythm, S1, S2 normal, no murmur, click, rub or gallop Abdomen: soft, non-tender; bowel sounds normal; no masses,  no organomegaly Pulses: 2+ and symmetric Skin: Skin color, texture, turgor normal. No rashes or lesions Lymph nodes: Cervical, supraclavicular, and axillary nodes normal.  Assessment and Plan:  HTN (hypertension) Well controlled on current regimen. Renal function stable.  Needs to stop atenolol and start lisinopril.  rx given. Stopping hctz due to dehydration noted on labs today.   Lab Results  Component Value Date   NA 133* 09/03/2014   K 4.0 09/03/2014   CL 94* 09/03/2014   CO2 28 09/03/2014     Diabetes type 2, uncontrolled Her regimen is not optimal, requiring 5 doses daily and a1c has not improved significantly.  I would like  To simplify her insulin regimen without wasting the levemir she has already purchased, by adding 15 units of adding 70/30 mixed insulin twice  Daily,  Stopping the short acting mealtime  Humulin and continuing basal insulin with Levemir but reducing dose to once daily  Starting at 30 units   Lab Results  Component Value Date   HGBA1C 10.2* 09/03/2014     Elevated liver enzymes Etiology unclear.  Given metabolic state ,  Suspect NASH.  Will have her return for repeat hepatic panel and serologies to rule out automminue and iron overload syndromes  Obesity I have congratulated her in reduction of   BMI and encouraged  Continued weight loss with goal of 10% of body weigh over the next 6 months using a low glycemic index diet and  regular exercise a minimum of 5 days per week.  She has saved $1000 to purchase a home exercise bike. The appeal form for the reduction in [place] obesity tax is complete .  In order for me to fill out these appeals in  good faith,  I will need to see patient back in 3 months to address and monitor her progress in reducing BMI. Her goal should be a minimum of 12 lbs of wt loss       Updated Medication List Outpatient Encounter Prescriptions as of 09/03/2014  Medication Sig  . atorvastatin (LIPITOR) 10 MG tablet Take 1 tablet (10 mg total) by mouth daily.  . Blood Glucose Monitoring Suppl (ONE TOUCH ULTRA SYSTEM KIT) W/DEVICE KIT 1 kit by Does not apply route once.  . Coenzyme Q10-Red Yeast Rice (CO Q-10 PLUS RED YEAST RICE) 60-600 MG CAPS Take by mouth  2 (two) times daily.  Marland Kitchen glucose blood test strip Check sugar 4 times daily  . Insulin Detemir (LEVEMIR) 100 UNIT/ML Pen Inject 30 Units into the skin daily at 10 pm. I  . insulin NPH-regular Human (NOVOLIN 70/30) (70-30) 100 UNIT/ML injection Inject 15 Units into the skin 2 (two) times daily with a meal.  . Insulin Pen Needle 32G X 6 MM MISC Inject 1 applicator into the skin at bedtime.  . Insulin Syringe-Needle U-100 (INSULIN SYRINGE .5CC/30GX5/16") 30G X 5/16" 0.5 ML MISC Insulin syringes to be used for humulin R  . lisinopril (PRINIVIL,ZESTRIL) 20 MG tablet Take 1 tablet (20 mg total) by mouth daily.  . meloxicam (MOBIC) 15 MG tablet Take 15 mg by mouth daily.  . metFORMIN (GLUCOPHAGE) 500 MG tablet Take 1 tablet (500 mg total) by mouth 2 (two) times daily with a meal.  . traMADol (ULTRAM) 50 MG tablet Take 1 tablet (50 mg total) by mouth every 8 (eight) hours as needed.  . [DISCONTINUED] atenolol (TENORMIN) 25 MG tablet Take 1 tablet (25 mg total) by mouth daily.  . [DISCONTINUED] ergocalciferol (DRISDOL) 50000 UNITS capsule Take 1 capsule (50,000 Units total) by mouth once a week. Take once weekly for 12 weeks  . [DISCONTINUED]  hydrochlorothiazide (HYDRODIURIL) 25 MG tablet Take 1 tablet (25 mg total) by mouth daily.  . [DISCONTINUED] Insulin Detemir (LEVEMIR) 100 UNIT/ML Pen Inject 24 units in the AM, 10 units in the PM  . [DISCONTINUED] insulin regular (HUMULIN R) 100 units/mL injection Sliding scale as follows: Less than 70 = 0 units 70-130 = 0 units 131-180 = 4 units 181-240 = 8 units 241-300 = 10 units 301-350 = 12 units 351-400 = 16 units >400 = 16 units and call PCP     Orders Placed This Encounter  Procedures  . Comprehensive metabolic panel  . Hemoglobin A1c  . Lipid panel  . Vit D  25 hydroxy (rtn osteoporosis monitoring)  . TSH  . LDL cholesterol, direct    No Follow-up on file.

## 2014-09-04 ENCOUNTER — Other Ambulatory Visit: Payer: Self-pay | Admitting: *Deleted

## 2014-09-04 ENCOUNTER — Encounter: Payer: Self-pay | Admitting: Internal Medicine

## 2014-09-04 DIAGNOSIS — R748 Abnormal levels of other serum enzymes: Secondary | ICD-10-CM | POA: Insufficient documentation

## 2014-09-04 MED ORDER — ERGOCALCIFEROL 1.25 MG (50000 UT) PO CAPS: 50000.0000 [IU] | ORAL_CAPSULE | ORAL | Status: DC

## 2014-09-04 MED ORDER — INSULIN DETEMIR 100 UNIT/ML FLEXPEN: 30.0000 [IU] | PEN_INJECTOR | Freq: Every day | SUBCUTANEOUS | Status: DC

## 2014-09-04 MED ORDER — INSULIN NPH ISOPHANE & REGULAR (70-30) 100 UNIT/ML ~~LOC~~ SUSP: 15.0000 [IU] | Freq: Two times a day (BID) | SUBCUTANEOUS | Status: DC

## 2014-09-04 NOTE — Assessment & Plan Note (Signed)
Etiology unclear.  Given metabolic state ,  Suspect NASH.  Will have her return for repeat hepatic panel and serologies to rule out automminue and iron overload syndromes

## 2014-09-04 NOTE — Assessment & Plan Note (Addendum)
I have congratulated her in reduction of   BMI and encouraged  Continued weight loss with goal of 10% of body weigh over the next 6 months using a low glycemic index diet and regular exercise a minimum of 5 days per week.  She has saved $1000 to purchase a home exercise bike. The appeal form for the reduction in [place] obesity tax is complete .  In order for me to fill out these appeals in  good faith,  I will need to see patient back in 3 months to address and monitor her progress in reducing BMI. Her goal should be a minimum of 12 lbs of wt loss

## 2014-09-04 NOTE — Assessment & Plan Note (Addendum)
Her regimen is not optimal, requiring 5 doses daily and a1c has not improved significantly.  I would like  To simplify her insulin regimen without wasting the levemir she has already purchased, by adding 15 units of adding 70/30 mixed insulin twice  Daily,  Stopping the short acting mealtime  Humulin and continuing basal insulin with Levemir but reducing dose to once daily  Starting at 30 units   Lab Results  Component Value Date   HGBA1C 10.2* 09/03/2014

## 2014-09-04 NOTE — Assessment & Plan Note (Addendum)
Well controlled on current regimen. Renal function stable.  Needs to stop atenolol and start lisinopril.  rx given. Stopping hctz due to dehydration noted on labs today.   Lab Results  Component Value Date   NA 133* 09/03/2014   K 4.0 09/03/2014   CL 94* 09/03/2014   CO2 28 09/03/2014

## 2014-09-04 NOTE — Addendum Note (Signed)
Addended by: Crecencio Mc on: 09/04/2014 11:22 AM   Modules accepted: Orders

## 2014-09-05 ENCOUNTER — Encounter: Payer: Self-pay | Admitting: *Deleted

## 2014-09-10 ENCOUNTER — Encounter: Payer: Self-pay | Admitting: Internal Medicine

## 2014-09-10 ENCOUNTER — Other Ambulatory Visit (INDEPENDENT_AMBULATORY_CARE_PROVIDER_SITE_OTHER): Payer: 59

## 2014-09-10 DIAGNOSIS — E1165 Type 2 diabetes mellitus with hyperglycemia: Secondary | ICD-10-CM

## 2014-09-10 DIAGNOSIS — IMO0002 Reserved for concepts with insufficient information to code with codable children: Secondary | ICD-10-CM

## 2014-09-10 DIAGNOSIS — E1140 Type 2 diabetes mellitus with diabetic neuropathy, unspecified: Secondary | ICD-10-CM

## 2014-09-10 DIAGNOSIS — R748 Abnormal levels of other serum enzymes: Secondary | ICD-10-CM

## 2014-09-10 DIAGNOSIS — E559 Vitamin D deficiency, unspecified: Secondary | ICD-10-CM

## 2014-09-10 LAB — HEPATIC FUNCTION PANEL
ALT: 117 U/L — ABNORMAL HIGH (ref 0–35)
AST: 100 U/L — ABNORMAL HIGH (ref 0–37)
Albumin: 3.3 g/dL — ABNORMAL LOW (ref 3.5–5.2)
Alkaline Phosphatase: 63 U/L (ref 39–117)
Bilirubin, Direct: 0.2 mg/dL (ref 0.0–0.3)
Total Bilirubin: 0.4 mg/dL (ref 0.2–1.2)
Total Protein: 7.1 g/dL (ref 6.0–8.3)

## 2014-09-10 LAB — MICROALBUMIN / CREATININE URINE RATIO
Creatinine,U: 247.9 mg/dL
Microalb Creat Ratio: 2.2 mg/g (ref 0.0–30.0)
Microalb, Ur: 5.5 mg/dL — ABNORMAL HIGH (ref 0.0–1.9)

## 2014-09-10 LAB — FERRITIN: Ferritin: 104 ng/mL (ref 10.0–291.0)

## 2014-09-10 LAB — VITAMIN D 25 HYDROXY (VIT D DEFICIENCY, FRACTURES): VITD: 18.99 ng/mL — ABNORMAL LOW (ref 30.00–100.00)

## 2014-09-11 LAB — IRON AND TIBC
%SAT: 13 % — ABNORMAL LOW (ref 20–55)
Iron: 46 ug/dL (ref 42–145)
TIBC: 368 ug/dL (ref 250–470)
UIBC: 322 ug/dL (ref 125–400)

## 2014-09-11 LAB — ANTI-SMITH ANTIBODY: ENA SM Ab Ser-aCnc: <1

## 2014-09-11 LAB — HEPATITIS C ANTIBODY: HCV Ab: NEGATIVE

## 2014-09-11 LAB — HEPATITIS B SURFACE ANTIGEN: Hepatitis B Surface Ag: NEGATIVE

## 2014-09-11 LAB — HEPATITIS B SURFACE ANTIBODY,QUALITATIVE: Hep B S Ab: POSITIVE — AB

## 2014-09-11 LAB — HEPATITIS B CORE ANTIBODY, TOTAL: Hep B Core Total Ab: NONREACTIVE

## 2014-09-11 LAB — ANA: Anti Nuclear Antibody(ANA): NEGATIVE

## 2014-09-13 ENCOUNTER — Encounter: Payer: Self-pay | Admitting: Internal Medicine

## 2014-09-27 ENCOUNTER — Other Ambulatory Visit: Payer: Self-pay | Admitting: Internal Medicine

## 2014-10-02 ENCOUNTER — Ambulatory Visit: Payer: Self-pay | Admitting: Adult Health

## 2014-10-07 NOTE — Telephone Encounter (Signed)
Her mammogram was abnormal on the right.  They saw microcalcifications and want additional images, which I have ordered.  I prefer to choose the surgeon with the patient if a biopsy is recommended, so if they tell her she needs a biopsy, she can tell them she is going to discuss it with me first.

## 2014-10-08 ENCOUNTER — Other Ambulatory Visit: Payer: Self-pay | Admitting: Internal Medicine

## 2014-10-13 ENCOUNTER — Encounter: Payer: Self-pay | Admitting: Internal Medicine

## 2014-10-17 ENCOUNTER — Encounter: Payer: Self-pay | Admitting: Adult Health

## 2014-10-24 ENCOUNTER — Ambulatory Visit (INDEPENDENT_AMBULATORY_CARE_PROVIDER_SITE_OTHER): Payer: 59 | Admitting: Nurse Practitioner

## 2014-10-24 ENCOUNTER — Encounter: Payer: Self-pay | Admitting: Nurse Practitioner

## 2014-10-24 VITALS — BP 118/70 | HR 87 | Temp 98.3°F | Resp 12 | Ht 68.5 in | Wt 313.4 lb

## 2014-10-24 DIAGNOSIS — L02511 Cutaneous abscess of right hand: Secondary | ICD-10-CM | POA: Insufficient documentation

## 2014-10-24 DIAGNOSIS — R05 Cough: Secondary | ICD-10-CM

## 2014-10-24 DIAGNOSIS — R059 Cough, unspecified: Secondary | ICD-10-CM

## 2014-10-24 MED ORDER — MUPIROCIN 2 % EX OINT: 1.0000 "application " | TOPICAL_OINTMENT | Freq: Three times a day (TID) | CUTANEOUS | Status: DC

## 2014-10-24 NOTE — Assessment & Plan Note (Addendum)
Improved. Discussed the possible etiologies of cough. Instructed pt to try OTC benadryl at night. Allegra, zyrtec, or claritin will help with allergy symptoms. Delsym OTC for cough, saline nasal flushes, and if symptoms worsen/fail to improve to call us. FU in Feb.

## 2014-10-24 NOTE — Assessment & Plan Note (Signed)
New onset. Instructed pt to keep soaking hand in epson salt baths, rx for bactroban topical 3 x daily, keep uncovered, can cover if contact with others, and if she gets a fever, the site is larger, or fails to improve to call us. Next appointment is in Feb. Pt is agreeable with instructions and wishes to see me at next appointment unless the above happens.

## 2014-10-24 NOTE — Progress Notes (Signed)
Pre visit review using our clinic review tool, if applicable. No additional management support is needed unless otherwise documented below in the visit note. 

## 2014-10-24 NOTE — Progress Notes (Signed)
Subjective:    Patient ID: [name], female    DOB: 27-Apr-1966, 48 y.o.   MRN: [id number]  HPI Ms. Robeck is a 48 yo female with a CC of cough x 3 days and abscess on right hand dorsal surface x 4 days.   1) Abscess. Pt states she saw a red spot that was painful on the back of her hand and Monday it came to a head. It is pruritic and she soaked it in epson salts. This helped it drain and feels it is improving. She has not been seen before today, she is a Radiation protection practitioner at [employer] and feels she does not need systemic antibiotics at this time. Daughter has staph (non-MRSA) infection of abdomen. Pt lives in close proximity with daughter.   2) Cough x 3 days. Pt describes she felt bad, started with a cough, decreased appetite, and temperatures 99-100. Sputum from chest and drainage from nose is clear. She denies other symptoms. She states this is improving also, but she wanted to keep the appointment.    Review of Systems  Constitutional: Positive for chills, diaphoresis, appetite change and fatigue. Negative for fever.  HENT: Positive for congestion, postnasal drip, rhinorrhea, sneezing and sore throat. Negative for ear discharge, ear pain, sinus pressure and trouble swallowing.   Eyes: Negative for visual disturbance.  Respiratory: Positive for cough. Negative for chest tightness, shortness of breath and wheezing.   Cardiovascular: Positive for leg swelling. Negative for chest pain and palpitations.       Described leg swelling as chronic.  Gastrointestinal: Negative for nausea, vomiting and diarrhea.  Musculoskeletal: Negative for neck pain and neck stiffness.  Skin: Positive for wound.       Right hand dorsal surface.   Neurological: Negative for dizziness and headaches.   Past Medical History  Diagnosis Date  . Asthma   . Hypertension   . Hyperlipidemia   . Migraine     Has used imitrex  . Kidney stone   . Diabetes mellitus without complication     History    Social History  . Marital Status: Single    Spouse Name: N/A    Number of Children: 2  . Years of Education: 14   Occupational History  . 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 Teaches microbiology   . 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 Smoking status: Never Smoker   . Smokeless tobacco: Never Used  . Alcohol Use: No  . Drug Use: No  . Sexual Activity: Not on file   Other Topics Concern  . Not on file   Social History Narrative   [name] grew up partly in [place] and then [place]. She lives in [place] with her two daughter. 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 She enjoys the outdoors, gardening.     Past Surgical History  Procedure Laterality Date  . Cesarean section    . Tonsillectomy and adenoidectomy      Family History  Problem Relation Age of Onset  . Alcohol abuse Mother   . Hyperlipidemia Mother   . Hypertension Mother   . Hyperlipidemia Father   . Hypertension Father   . Parkinson's disease Father   . Drug abuse Sister   . Cancer Maternal Grandmother     breast cancer  . Cancer Paternal Grandmother     Allergies  Allergen Reactions  . Nsaids Hives    Current Outpatient Prescriptions on File Prior to Visit  Medication Sig Dispense Refill  . atorvastatin (LIPITOR) 10 MG tablet Take 1 tablet (10 mg total) by mouth daily. 30 tablet 3  . Blood Glucose Monitoring Suppl (ONE TOUCH ULTRA SYSTEM KIT) W/DEVICE KIT 1 kit by Does not apply route once. 1 each 0  . Coenzyme Q10-Red Yeast Rice (CO Q-10 PLUS RED YEAST RICE) 60-600 MG CAPS Take by mouth 2 (two) times daily.    . ergocalciferol (DRISDOL) 50000 UNITS capsule Take 1 capsule (50,000 Units total) by mouth once a week. Take once weekly for 12 weeks 4 capsule 2  . glucose blood test strip Check sugar 4 times daily 400 each 1  . insulin NPH-regular Human (NOVOLIN 70/30) (70-30) 100 UNIT/ML  injection Inject 15 Units into the skin 2 (two) times daily with a meal. 10 mL 11  . Insulin Pen Needle 32G X 6 MM MISC Inject 1 applicator into the skin at bedtime. 100 each 1  . Insulin Syringe-Needle U-100 (INSULIN SYRINGE .5CC/30GX5/16") 30G X 5/16" 0.5 ML MISC Insulin syringes to be used for humulin R 300 each 1  . LEVEMIR FLEXTOUCH 100 UNIT/ML Pen Inject 24 units in the  morning and 10 units in the evening 45 mL 1  . lisinopril (PRINIVIL,ZESTRIL) 20 MG tablet TAKE 1 TABLET (20 MG TOTAL) BY MOUTH DAILY. 30 tablet 5  . meloxicam (MOBIC) 15 MG tablet Take 15 mg by mouth daily.    . metFORMIN (GLUCOPHAGE) 500 MG tablet Take 1 tablet (500 mg total) by mouth 2 (two) times daily with a meal. 180 tablet 1  . traMADol (ULTRAM) 50 MG tablet Take 1 tablet (50 mg total) by mouth every 8 (eight) hours as needed. 30 tablet 2   No current facility-administered medications on file prior to visit.       Objective:   Physical Exam  Constitutional: She is oriented to person, place, and time. She appears well-developed and well-nourished. No distress.  HENT:  Head: Normocephalic and atraumatic.  Cardiovascular: Normal rate and regular rhythm.   Pulmonary/Chest: Effort normal and breath sounds normal.  Neurological: She is alert and oriented to person, place, and time.  Skin: Skin is warm and dry. Rash noted. She is not diaphoretic. There is erythema.     Psychiatric: She has a normal mood and affect. Her behavior is normal. Judgment and thought content normal.     BP 118/70 mmHg  Pulse 87  Temp(Src) 98.3 F (36.8 C) (Oral)  Resp 12  Ht 5' 8.5" (1.74 m)  Wt 313 lb 6.4 oz (142.157 kg)  BMI 46.95 kg/m2  SpO2 96%  LMP 07/10/2014      Assessment & Plan:

## 2014-10-24 NOTE — Patient Instructions (Addendum)
  Keep soaking hand in epson salt baths.   If you get a fever of 101 or greater, growth of the site, or it fails to get better please call us.   Mupirocin topically is good for up to 3 x daily. Keep uncovered if possible. Cover when in contact with others.   Your cough may be coming from allergies or post nasal drip (PND).  PND and allergies can be treated with benadryl,  Allegra, Zyrtec or claritin. Benadryl is the most effective for drying you up but it is also the most sedating,  So try taking it at night and use one of the others in the daytime  "DM" stands for dextromethorphan which is a cough suppressant.  The best/strongest available OTC cough suppressant is Delsym.  If the cough does not improve give [place] a call.   Consider using simply Saline to flush your sinuses twice daily when you have congestion to prevent sinus infections

## 2014-10-28 ENCOUNTER — Emergency Department: Payer: Self-pay | Admitting: Emergency Medicine

## 2014-11-02 ENCOUNTER — Encounter: Payer: Self-pay | Admitting: Nurse Practitioner

## 2014-11-05 ENCOUNTER — Ambulatory Visit: Payer: Self-pay | Admitting: Internal Medicine

## 2014-11-06 ENCOUNTER — Encounter: Payer: Self-pay | Admitting: Nurse Practitioner

## 2014-11-06 ENCOUNTER — Ambulatory Visit (INDEPENDENT_AMBULATORY_CARE_PROVIDER_SITE_OTHER): Payer: 59 | Admitting: Nurse Practitioner

## 2014-11-06 VITALS — BP 120/82 | HR 87 | Temp 98.1°F | Resp 12 | Ht 68.5 in | Wt 313.8 lb

## 2014-11-06 DIAGNOSIS — L02511 Cutaneous abscess of right hand: Secondary | ICD-10-CM

## 2014-11-06 DIAGNOSIS — L0291 Cutaneous abscess, unspecified: Secondary | ICD-10-CM

## 2014-11-06 NOTE — Patient Instructions (Addendum)
  Call us to report any increase in swelling, redness, draining.    Abscess An abscess is an infected area that contains a collection of pus and debris.It can occur in almost any part of the body. An abscess is also known as a furuncle or boil. CAUSES  An abscess occurs when tissue gets infected. This can occur from blockage of oil or sweat glands, infection of hair follicles, or a minor injury to the skin. As the body tries to fight the infection, pus collects in the area and creates pressure under the skin. This pressure causes pain. People with weakened immune systems have difficulty fighting infections and get certain abscesses more often.  SYMPTOMS Usually an abscess develops on the skin and becomes a painful mass that is red, warm, and tender. If the abscess forms under the skin, you may feel a moveable soft area under the skin. Some abscesses break open (rupture) on their own, but most will continue to get worse without care. The infection can spread deeper into the body and eventually into the bloodstream, causing you to feel ill.  DIAGNOSIS  Your caregiver will take your medical history and perform a physical exam. A sample of fluid may also be taken from the abscess to determine what is causing your infection. TREATMENT  Your caregiver may prescribe antibiotic medicines to fight the infection. However, taking antibiotics alone usually does not cure an abscess. Your caregiver may need to make a small cut (incision) in the abscess to drain the pus. In some cases, gauze is packed into the abscess to reduce pain and to continue draining the area. HOME CARE INSTRUCTIONS   Only take over-the-counter or prescription medicines for pain, discomfort, or fever as directed by your caregiver.  If you were prescribed antibiotics, take them as directed. Finish them even if you start to feel better.  If gauze is used, follow your caregiver's directions for changing the gauze.  To avoid spreading the  infection:  Keep your draining abscess covered with a bandage.  Wash your hands well.  Do not share personal care items, towels, or whirlpools with others.  Avoid skin contact with others.  Keep your skin and clothes clean around the abscess.  Keep all follow-up appointments as directed by your caregiver. SEEK MEDICAL CARE IF:   You have increased pain, swelling, redness, fluid drainage, or bleeding.  You have muscle aches, chills, or a general ill feeling.  You have a fever. MAKE SURE YOU:   Understand these instructions.  Will watch your condition.  Will get help right away if you are not doing well or get worse. Document Released: 08/05/2005 Document Revised: 04/26/2012 Document Reviewed: 01/08/2012 [facility] [place] Patient Information 2015 [place], [place]. This information is not intended to replace advice given to you by your health care provider. Make sure you discuss any questions you have with your health care provider.

## 2014-11-06 NOTE — Progress Notes (Signed)
Subjective:    Patient ID: [name], female    DOB: 07/26/1966, 48 y.o.   MRN: [id number]  HPI  [name] is a 48 yo female with a CC of ED follow up 10 days ago from abscess.    1) Last visit 10/24/14 with 1.5 cm diameter. Last drained 2 days ago. Currently scabbed over. Doxycycline 10 days twice daily, Cleaning with soap at home, Still 1.5 cm in diameter with red circumferential surrounding area of 4 cm.   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 ED did not culture site (when lanced nothing came out she reports)   Review of Systems  Constitutional: Negative for fever, chills, diaphoresis, appetite change and fatigue.  Respiratory: Negative for chest tightness and shortness of breath.   Cardiovascular: Negative for chest pain, palpitations and leg swelling.  Skin: Positive for color change and wound. Negative for pallor and rash.   Past Medical History  Diagnosis Date  . Asthma   . Hypertension   . Hyperlipidemia   . Migraine     Has used imitrex  . Kidney stone   . Diabetes mellitus without complication     History   Social History  . Marital Status: Single    Spouse Name: N/A    Number of Children: 2  . Years of Education: 14   Occupational History  . 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 Teaches microbiology   . 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 Smoking status: Never Smoker   . Smokeless tobacco: Never Used  . Alcohol Use: No  . Drug Use: No  . Sexual Activity: Not on file   Other Topics Concern  . Not on file   Social History Narrative   [name] grew up partly in [place] and then [place]. She lives in [place] with her two daughter. 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 She enjoys the outdoors, gardening.     Past Surgical History  Procedure Laterality Date  . Cesarean section    . Tonsillectomy and  adenoidectomy      Family History  Problem Relation Age of Onset  . Alcohol abuse Mother   . Hyperlipidemia Mother   . Hypertension Mother   . Hyperlipidemia Father   . Hypertension Father   . Parkinson's disease Father   . Drug abuse Sister   . Cancer Maternal Grandmother     breast cancer  . Cancer Paternal Grandmother     Allergies  Allergen Reactions  . Nsaids Hives    Current Outpatient Prescriptions on File Prior to Visit  Medication Sig Dispense Refill  . atorvastatin (LIPITOR) 10 MG tablet Take 1 tablet (10 mg total) by mouth daily. 30 tablet 3  . Blood Glucose Monitoring Suppl (ONE TOUCH ULTRA SYSTEM KIT) W/DEVICE KIT 1 kit by Does not apply route once. 1 each 0  . Coenzyme Q10-Red Yeast Rice (CO Q-10 PLUS RED YEAST RICE) 60-600 MG CAPS Take by mouth 2 (two) times daily.    . ergocalciferol (DRISDOL) 50000 UNITS capsule Take 1 capsule (50,000 Units total) by mouth once a week. Take once weekly for 12 weeks 4 capsule 2  . glucose blood test strip Check sugar 4 times daily 400 each 1  . insulin NPH-regular Human (NOVOLIN 70/30) (70-30) 100 UNIT/ML injection Inject 15 Units into  the skin 2 (two) times daily with a meal. 10 mL 11  . Insulin Pen Needle 32G X 6 MM MISC Inject 1 applicator into the skin at bedtime. 100 each 1  . Insulin Syringe-Needle U-100 (INSULIN SYRINGE .5CC/30GX5/16") 30G X 5/16" 0.5 ML MISC Insulin syringes to be used for humulin R 300 each 1  . LEVEMIR FLEXTOUCH 100 UNIT/ML Pen Inject 24 units in the  morning and 10 units in the evening 45 mL 1  . lisinopril (PRINIVIL,ZESTRIL) 20 MG tablet TAKE 1 TABLET (20 MG TOTAL) BY MOUTH DAILY. 30 tablet 5  . meloxicam (MOBIC) 15 MG tablet Take 15 mg by mouth daily.    . metFORMIN (GLUCOPHAGE) 500 MG tablet Take 1 tablet (500 mg total) by mouth 2 (two) times daily with a meal. 180 tablet 1  . mupirocin ointment (BACTROBAN) 2 % Apply 1 application topically 3 (three) times daily. 22 g 0  . traMADol (ULTRAM) 50 MG  tablet Take 1 tablet (50 mg total) by mouth every 8 (eight) hours as needed. 30 tablet 2   No current facility-administered medications on file prior to visit.      Objective:   Physical Exam  Constitutional: She is oriented to person, place, and time. She appears well-developed and well-nourished.  Obese  HENT:  Head: Normocephalic and atraumatic.  Right Ear: External ear normal.  Left Ear: External ear normal.  Mouth/Throat: No oropharyngeal exudate.  Musculoskeletal: She exhibits edema. She exhibits no tenderness.  Full ROM in right hand, swelling evident  Neurological: She is alert and oriented to person, place, and time.  Skin: Skin is warm and dry. There is erythema.  Redness, central abscessed area. Healing and scab in middle.   Psychiatric: She has a normal mood and affect. Her behavior is normal. Judgment and thought content normal.   BP 120/82 mmHg  Pulse 87  Temp(Src) 98.1 F (36.7 C) (Oral)  Resp 12  Ht 5' 8.5" (1.74 m)  Wt 313 lb 12.8 oz (142.339 kg)  BMI 47.01 kg/m2  SpO2 95%  LMP 07/10/2014     Assessment & Plan:

## 2014-11-06 NOTE — Assessment & Plan Note (Signed)
ED follow up. Feeling improved since I&D. Still healing, full ROM. Pt did not want to come back unless it worsens again. This is okay with me. She has 1 day left on abx. FU in Feb. For diabetes. Pt was given handout with information regarding abscesses and she works at [employer] as a Radiation protection practitioner.

## 2014-11-06 NOTE — Progress Notes (Signed)
Pre visit review using our clinic review tool, if applicable. No additional management support is needed unless otherwise documented below in the visit note. 

## 2014-12-03 ENCOUNTER — Encounter: Payer: Self-pay | Admitting: Nurse Practitioner

## 2014-12-04 ENCOUNTER — Encounter: Payer: Self-pay | Admitting: Nurse Practitioner

## 2014-12-04 DIAGNOSIS — E1165 Type 2 diabetes mellitus with hyperglycemia: Secondary | ICD-10-CM

## 2014-12-04 DIAGNOSIS — IMO0002 Reserved for concepts with insufficient information to code with codable children: Secondary | ICD-10-CM

## 2014-12-04 MED ORDER — INSULIN NPH ISOPHANE & REGULAR (70-30) 100 UNIT/ML ~~LOC~~ SUSP: 15.0000 [IU] | Freq: Two times a day (BID) | SUBCUTANEOUS | Status: DC

## 2014-12-04 MED ORDER — INSULIN PEN NEEDLE 32G X 6 MM MISC: 1.0000 | Freq: Every day | Status: DC

## 2014-12-04 NOTE — Telephone Encounter (Signed)
Patient needed Rx transferred to mail order pharmacy for insurance to continue to cover it.

## 2014-12-05 ENCOUNTER — Other Ambulatory Visit: Payer: Self-pay | Admitting: *Deleted

## 2014-12-05 ENCOUNTER — Encounter: Payer: Self-pay | Admitting: Internal Medicine

## 2014-12-05 MED ORDER — INSULIN PEN NEEDLE 32G X 6 MM MISC: 1.0000 | Freq: Every day | Status: DC

## 2014-12-10 ENCOUNTER — Encounter: Payer: Self-pay | Admitting: Nurse Practitioner

## 2014-12-10 ENCOUNTER — Ambulatory Visit (INDEPENDENT_AMBULATORY_CARE_PROVIDER_SITE_OTHER): Payer: 59 | Admitting: Nurse Practitioner

## 2014-12-10 VITALS — BP 130/78 | HR 84 | Temp 98.3°F | Resp 12 | Ht 68.5 in | Wt 316.4 lb

## 2014-12-10 DIAGNOSIS — E1165 Type 2 diabetes mellitus with hyperglycemia: Secondary | ICD-10-CM

## 2014-12-10 DIAGNOSIS — IMO0002 Reserved for concepts with insufficient information to code with codable children: Secondary | ICD-10-CM

## 2014-12-10 MED ORDER — HYDROCHLOROTHIAZIDE 12.5 MG PO TABS: 12.5000 mg | ORAL_TABLET | Freq: Every day | ORAL | Status: DC

## 2014-12-10 MED ORDER — ERGOCALCIFEROL 1.25 MG (50000 UT) PO CAPS: 50000.0000 [IU] | ORAL_CAPSULE | ORAL | Status: DC

## 2014-12-10 NOTE — Progress Notes (Signed)
   Subjective:    Patient ID: [name], female    DOB: 1966-05-29, 49 y.o.   MRN: [id number]  HPI   [name] is a 49 yo female following up for diabetic check up.   1) Diabetes:   Lab Results  Component Value Date   HGBA1C 10.2* 09/03/2014   HGBA1C 11.6* 05/28/2014   Lab Results  Component Value Date   MICROALBUR 5.5* 09/10/2014   LDLCALC 185* 05/28/2014   CREATININE 0.7 09/03/2014   NPH 70/30 Novolin 15 units 200, 20 units at 300 and 25 units at 400 Levemir Pen 30 units at bed time  Metformin 500 mg twice daily  Numbers at home running- Up in 400's, exercised one day and it went down to 122. 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  Diet- Eating out, taking half of meals home, eating protein, carb, salad  Exercise- None right now  Foot exam- Today Ophalmology- 2015 no diabetic changes  No hypoglycemic events  Review of Systems  Constitutional: Negative for fever, chills, diaphoresis, appetite change, fatigue and unexpected weight change.  Eyes: Positive for visual disturbance.       Been to eye Dr.   Respiratory: Negative for chest tightness, shortness of breath and wheezing.   Cardiovascular: Positive for leg swelling. Negative for chest pain and palpitations.  Gastrointestinal: Negative for nausea, vomiting, abdominal pain, diarrhea and abdominal distention.  Endocrine: Positive for polydipsia. Negative for polyphagia and polyuria.  Genitourinary: Negative for dysuria.       Foamy urine  Musculoskeletal: Positive for myalgias.       Painful cramping of both calves  Skin: Negative for rash.  Neurological: Positive for dizziness and headaches. Negative for weakness and numbness.       Headaches left side of head, dizziness intermittent  Psychiatric/Behavioral: The patient is not nervous/anxious.        Objective:   Physical Exam  Constitutional: She is oriented to person, place, and time. She appears well-developed and  well-nourished. No distress.  BP 130/78 mmHg  Pulse 84  Temp(Src) 98.3 F (36.8 C)  Resp 12  Ht 5' 8.5" (1.74 m)  Wt 316 lb 6.4 oz (143.518 kg)  BMI 47.40 kg/m2  SpO2 95%   HENT:  Head: Normocephalic and atraumatic.  Right Ear: External ear normal.  Left Ear: External ear normal.  Cardiovascular: Normal rate, regular rhythm, normal heart sounds and intact distal pulses.  Exam reveals no gallop and no friction rub.   No murmur heard. Pulmonary/Chest: Effort normal and breath sounds normal. No respiratory distress. She has no wheezes. She has no rales. She exhibits no tenderness.  Neurological: She is alert and oriented to person, place, and time. No cranial nerve deficit. She exhibits normal muscle tone. Coordination normal.  Skin: Skin is warm and dry. No rash noted. She is not diaphoretic.  Recurring skin infections. Pt is microbiologist at [employer]: She has a normal mood and affect. Her behavior is normal. Judgment and thought content normal.      Assessment & Plan:

## 2014-12-10 NOTE — Patient Instructions (Addendum)
The front desk has the number to call for billing.   Please have your lab work done and copy back to our office.   Iodine, Microalbumin, A1C, ferritin, and Vitamin D.   3 months we will follow up. Continue to make good healthy choices and exercise when free.

## 2014-12-10 NOTE — Progress Notes (Signed)
Pre visit review using our clinic review tool, if applicable. No additional management support is needed unless otherwise documented below in the visit note. 

## 2014-12-10 NOTE — Assessment & Plan Note (Signed)
Stable.  Lab Results  Component Value Date   HGBA1C 10.2* 09/03/2014   HGBA1C 11.6* 05/28/2014   Lab Results  Component Value Date   MICROALBUR 5.5* 09/10/2014   LDLCALC 185* 05/28/2014   CREATININE 0.7 09/03/2014   Continue Levemir 30 units at night, NPH 15 units with meals twice daily and Metformin twice daily 500 mg. Discussed healthful eating habits and exercise. Will obtain lab work through [employer]

## 2014-12-11 ENCOUNTER — Encounter: Payer: Self-pay | Admitting: Nurse Practitioner

## 2014-12-11 NOTE — Telephone Encounter (Signed)
See other mychart, pt said to disregard this message

## 2014-12-12 ENCOUNTER — Other Ambulatory Visit: Payer: Self-pay | Admitting: *Deleted

## 2014-12-12 ENCOUNTER — Other Ambulatory Visit: Payer: Self-pay | Admitting: Nurse Practitioner

## 2014-12-12 DIAGNOSIS — E1165 Type 2 diabetes mellitus with hyperglycemia: Secondary | ICD-10-CM

## 2014-12-12 DIAGNOSIS — IMO0002 Reserved for concepts with insufficient information to code with codable children: Secondary | ICD-10-CM

## 2014-12-12 MED ORDER — ONETOUCH ULTRA SYSTEM W/DEVICE KIT: 1.0000 | PACK | Freq: Once | Status: DC

## 2014-12-12 MED ORDER — INSULIN NPH ISOPHANE & REGULAR (70-30) 100 UNIT/ML ~~LOC~~ SUSP: 15.0000 [IU] | Freq: Two times a day (BID) | SUBCUTANEOUS | Status: DC

## 2014-12-12 MED ORDER — GLUCOSE BLOOD VI STRP: ORAL_STRIP | Status: DC

## 2014-12-12 MED ORDER — LISINOPRIL 20 MG PO TABS: ORAL_TABLET | ORAL | Status: DC

## 2014-12-14 ENCOUNTER — Other Ambulatory Visit: Payer: Self-pay | Admitting: *Deleted

## 2014-12-14 ENCOUNTER — Telehealth: Payer: Self-pay | Admitting: *Deleted

## 2014-12-14 MED ORDER — METFORMIN HCL 500 MG PO TABS: 500.0000 mg | ORAL_TABLET | Freq: Two times a day (BID) | ORAL | Status: DC

## 2014-12-14 NOTE — Telephone Encounter (Signed)
She might need it due to her Novolin 70/30 taking awhile to get to her. Please refill. Thanks!

## 2014-12-14 NOTE — Telephone Encounter (Signed)
rx sent

## 2014-12-14 NOTE — Telephone Encounter (Signed)
Fax from pharmacy requesting Glucophage 500 mg.  As per med inactive list, it was pt preference not to continue refill.  Please advise

## 2014-12-19 ENCOUNTER — Other Ambulatory Visit: Payer: Self-pay | Admitting: Nurse Practitioner

## 2014-12-19 DIAGNOSIS — IMO0002 Reserved for concepts with insufficient information to code with codable children: Secondary | ICD-10-CM

## 2014-12-19 DIAGNOSIS — E1165 Type 2 diabetes mellitus with hyperglycemia: Secondary | ICD-10-CM

## 2014-12-19 MED ORDER — INSULIN NPH ISOPHANE & REGULAR (70-30) 100 UNIT/ML ~~LOC~~ SUSP: 15.0000 [IU] | Freq: Two times a day (BID) | SUBCUTANEOUS | Status: DC

## 2014-12-25 LAB — LIPID PANEL
Cholesterol: 221 mg/dL — AB (ref 0–200)
HDL: 35 mg/dL (ref 35–70)
Triglycerides: 192 mg/dL — AB (ref 40–160)

## 2014-12-25 LAB — BASIC METABOLIC PANEL
BUN: 10 mg/dL (ref 4–21)
Creatinine: 0.6 mg/dL (ref 0.5–1.1)
Glucose: 268 mg/dL
Potassium: 4.4 mmol/L (ref 3.4–5.3)
Sodium: 137 mmol/L (ref 137–147)

## 2014-12-25 LAB — HEPATIC FUNCTION PANEL
ALT: 94 U/L — AB (ref 7–35)
AST: 61 U/L — AB (ref 13–35)
Alkaline Phosphatase: 67 U/L (ref 25–125)
Bilirubin, Total: 0.4 mg/dL

## 2015-01-10 ENCOUNTER — Encounter: Payer: Self-pay | Admitting: Nurse Practitioner

## 2015-01-15 ENCOUNTER — Other Ambulatory Visit: Payer: Self-pay | Admitting: Nurse Practitioner

## 2015-01-15 ENCOUNTER — Ambulatory Visit: Payer: 59 | Admitting: Nurse Practitioner

## 2015-01-15 ENCOUNTER — Encounter: Payer: Self-pay | Admitting: Nurse Practitioner

## 2015-01-15 DIAGNOSIS — M25432 Effusion, left wrist: Secondary | ICD-10-CM

## 2015-01-16 ENCOUNTER — Other Ambulatory Visit: Payer: Self-pay | Admitting: Nurse Practitioner

## 2015-01-18 ENCOUNTER — Ambulatory Visit: Payer: Self-pay | Admitting: Nurse Practitioner

## 2015-01-21 ENCOUNTER — Ambulatory Visit (INDEPENDENT_AMBULATORY_CARE_PROVIDER_SITE_OTHER): Payer: 59 | Admitting: Nurse Practitioner

## 2015-01-21 ENCOUNTER — Encounter: Payer: Self-pay | Admitting: Nurse Practitioner

## 2015-01-21 VITALS — BP 122/82 | HR 86 | Temp 98.4°F | Resp 14 | Ht 68.5 in | Wt 313.8 lb

## 2015-01-21 DIAGNOSIS — E1165 Type 2 diabetes mellitus with hyperglycemia: Secondary | ICD-10-CM

## 2015-01-21 DIAGNOSIS — IMO0002 Reserved for concepts with insufficient information to code with codable children: Secondary | ICD-10-CM

## 2015-01-21 DIAGNOSIS — G4762 Sleep related leg cramps: Secondary | ICD-10-CM | POA: Insufficient documentation

## 2015-01-21 DIAGNOSIS — M25432 Effusion, left wrist: Secondary | ICD-10-CM

## 2015-01-21 NOTE — Assessment & Plan Note (Signed)
[place] negative for findings on left wrist. Will try Ice, rest, ibuprofen. FU in May.

## 2015-01-21 NOTE — Patient Instructions (Addendum)
Increase water intake.  B-complex vitamins may be helpful for leg cramps.  Keeping up the walking will be helpful and eating a healthier diet.   Wrist- ice, rest

## 2015-01-21 NOTE — Assessment & Plan Note (Signed)
Worsening. Worse with sleep deprivation. Try stretching before bed (demonstrated for pt) staying hydrated with water, B-complex vitamins, continuing with Vitamin D.

## 2015-01-21 NOTE — Progress Notes (Signed)
Subjective:    Patient ID: [name], female    DOB: [birth year], 49 y.o.   MRN: [id number]  HPI  [name] is a 49 yo female following up on [place] for left wrist.  1) Swelling, when using wrist often it is worse, Ultrasound of left wrist showed no cysts or solid masses.  Not trying anything for her wrist   2) Getting back on insulin- eating is spiking numbers she reports 35-40 units of the 70/30  Exercise-Walking, gardening Diet- eating out a lot   3) Leg cramps at night when sleep deprived.   50,000 units weekly of Vitamin D3.   Review of Systems  Constitutional: Negative for fever, chills, diaphoresis and fatigue.  Respiratory: Negative for chest tightness, shortness of breath and wheezing.   Cardiovascular: Negative for chest pain, palpitations and leg swelling.  Gastrointestinal: Negative for nausea, vomiting and diarrhea.  Endocrine: Negative for polydipsia, polyphagia and polyuria.  Musculoskeletal: Positive for myalgias and joint swelling.       Leg cramps intermittently Left wrist swelling  Skin: Negative for rash.  Neurological: Negative for dizziness, weakness, numbness and headaches.  Psychiatric/Behavioral: The patient is not nervous/anxious.    Past Medical History  Diagnosis Date  . Asthma   . Hypertension   . Hyperlipidemia   . Migraine     Has used imitrex  . Kidney stone   . Diabetes mellitus without complication     History   Social History  . Marital Status: Single    Spouse Name: N/A  . Number of Children: 2  . Years of Education: 14   Occupational History  . 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 Teaches microbiology   . 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 Smoking status: Never Smoker   . Smokeless tobacco: Never Used  . Alcohol Use: No  . Drug Use: No  . Sexual Activity: Not on file   Other Topics Concern  . Not on file   Social History  Narrative   [name] grew up partly in [place] and then [place]. She lives in [place] with her two daughter. 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 She enjoys the outdoors, gardening.     Past Surgical History  Procedure Laterality Date  . Cesarean section    . Tonsillectomy and adenoidectomy      Family History  Problem Relation Age of Onset  . Alcohol abuse Mother   . Hyperlipidemia Mother   . Hypertension Mother   . Hyperlipidemia Father   . Hypertension Father   . Parkinson's disease Father   . Drug abuse Sister   . Cancer Maternal Grandmother     breast cancer  . Cancer Paternal Grandmother     Allergies  Allergen Reactions  . Nsaids Hives    Current Outpatient Prescriptions on File Prior to Visit  Medication Sig Dispense Refill  . Blood Glucose Monitoring Suppl (ONE TOUCH ULTRA SYSTEM KIT) W/DEVICE KIT 1 kit by Does not apply route once. 1 each 0  . Coenzyme Q10-Red Yeast Rice (CO Q-10 PLUS RED YEAST RICE) 60-600 MG CAPS Take by mouth 2 (two) times daily.    . ergocalciferol (DRISDOL) 50000 UNITS capsule Take 1 capsule (50,000 Units total) by mouth once a week. Take once weekly for 12 weeks 4 capsule 2  . glucose blood test strip  Check sugar 4 times daily 400 each 1  . hydrochlorothiazide (HYDRODIURIL) 12.5 MG tablet Take 1 tablet (12.5 mg total) by mouth daily. 90 tablet 3  . insulin NPH-regular Human (NOVOLIN 70/30) (70-30) 100 UNIT/ML injection Inject 15 Units into the skin 2 (two) times daily with a meal. 30 mL 2  . Insulin Pen Needle 32G X 6 MM MISC Inject 1 applicator into the skin at bedtime. 100 each 1  . Insulin Syringe-Needle U-100 (INSULIN SYRINGE .5CC/30GX5/16") 30G X 5/16" 0.5 ML MISC Insulin syringes to be used for humulin R 300 each 1  . LEVEMIR FLEXTOUCH 100 UNIT/ML Pen Inject 24 units in the  morning and 10 units in the evening 45 mL 1  . lisinopril (PRINIVIL,ZESTRIL) 20 MG tablet TAKE 1 TABLET (20 MG TOTAL) BY MOUTH DAILY. 90  tablet 2  . metFORMIN (GLUCOPHAGE) 500 MG tablet Take 1 tablet (500 mg total) by mouth 2 (two) times daily with a meal. 180 tablet 1  . mupirocin ointment (BACTROBAN) 2 % Apply 1 application topically 3 (three) times daily. 22 g 0  . traMADol (ULTRAM) 50 MG tablet Take 1 tablet (50 mg total) by mouth every 8 (eight) hours as needed. 30 tablet 2   No current facility-administered medications on file prior to visit.       Objective:   Physical Exam  Constitutional: She is oriented to person, place, and time. She appears well-developed and well-nourished. No distress.  BP 122/82 mmHg  Pulse 86  Temp(Src) 98.4 F (36.9 C) (Oral)  Resp 14  Ht 5' 8.5" (1.74 m)  Wt 313 lb 12.8 oz (142.339 kg)  BMI 47.01 kg/m2  SpO2 96%   HENT:  Head: Normocephalic and atraumatic.  Right Ear: External ear normal.  Left Ear: External ear normal.  Eyes: Right eye exhibits no discharge. Left eye exhibits no discharge. No scleral icterus.  Cardiovascular: Normal rate, regular rhythm, normal heart sounds and intact distal pulses.  Exam reveals no gallop and no friction rub.   No murmur heard. Pulmonary/Chest: Effort normal and breath sounds normal. No respiratory distress. She has no wheezes. She has no rales. She exhibits no tenderness.  Musculoskeletal: She exhibits edema. She exhibits no tenderness.  Left wrist edematous, non-tender.   Neurological: She is alert and oriented to person, place, and time. No cranial nerve deficit. She exhibits normal muscle tone. Coordination normal.  Skin: Skin is warm and dry. No rash noted. She is not diaphoretic.  Psychiatric: She has a normal mood and affect. Her behavior is normal. Judgment and thought content normal.      Assessment & Plan:

## 2015-01-21 NOTE — Assessment & Plan Note (Signed)
Uncontrolled. Following up in May. Pt not watching diet. Beginning to exercise with walking. Will follow.

## 2015-01-21 NOTE — Progress Notes (Signed)
Pre visit review using our clinic review tool, if applicable. No additional management support is needed unless otherwise documented below in the visit note. 

## 2015-02-01 ENCOUNTER — Encounter: Payer: Self-pay | Admitting: Nurse Practitioner

## 2015-02-04 ENCOUNTER — Other Ambulatory Visit: Payer: Self-pay | Admitting: Nurse Practitioner

## 2015-02-04 DIAGNOSIS — M79641 Pain in right hand: Secondary | ICD-10-CM

## 2015-02-05 ENCOUNTER — Other Ambulatory Visit: Payer: Self-pay

## 2015-02-05 MED ORDER — TRAMADOL HCL 50 MG PO TABS: 50.0000 mg | ORAL_TABLET | Freq: Three times a day (TID) | ORAL | Status: DC | PRN

## 2015-02-05 NOTE — Telephone Encounter (Signed)
Paper Rx request for Tramadol received. Patient last seen in office 01/21/15. Medication last filled 07/18/14 30 tabs with 2 additional refills. Please advise.

## 2015-02-05 NOTE — Telephone Encounter (Signed)
Rx faxed to pharmacy  

## 2015-02-18 ENCOUNTER — Encounter: Payer: Self-pay | Admitting: Nurse Practitioner

## 2015-02-22 ENCOUNTER — Telehealth: Payer: Self-pay

## 2015-02-22 MED ORDER — "INSULIN SYRINGE 30G X 5/16"" 0.5 ML MISC": Status: DC

## 2015-02-22 NOTE — Telephone Encounter (Signed)
Faxed Rx

## 2015-02-26 ENCOUNTER — Other Ambulatory Visit: Payer: Self-pay | Admitting: *Deleted

## 2015-02-26 MED ORDER — "INSULIN SYRINGE 30G X 5/16"" 0.5 ML MISC": Status: DC

## 2015-03-03 NOTE — H&P (Signed)
PATIENT NAME:  [name], [name] MR#:  [id number] DATE OF BIRTH:  15-Mar-1966  DATE OF ADMISSION:  02/07/2012  CHIEF COMPLAINT: Flank pain.  HISTORY OF PRESENT ILLNESS: Patient is a 49 year old female with known history of kidney stones as well as hypertension and migraines who presents to the ER with acute renal colic starting last night and getting worse early this morning accompanied by nausea and vomiting. Her pain and nausea and vomiting are unable to be controlled in the ER. Patient denies any fevers or chills or change in urinary habits. CT scan shows moderate right hydronephrosis secondary to large UPJ stone measuring 9 x 7 mm and she has likely other right ureteral stone as well. As mentioned, patient has had intractable nausea and vomiting here in the ER and they request urology involvement.   REVIEW OF SYSTEMS: CONSTITUTIONAL: Nausea, vomiting, flank pain. No fever. No chills. Balance of other systems otherwise negative.   ALLERGIES: Aspirin, ibuprofen, iodine, NSAIDs, sulfa drugs, latex.   PAST MEDICAL HISTORY: 1. Hypertension. 2. Migraines. 3. Kidney stones.   PAST SURGICAL HISTORY: Lithotripsy and stents and ureteroscopy in the past.   OUTPATIENT MEDICATIONS: Atenolol 25 mg daily.  PHYSICAL EXAMINATION: VITAL SIGNS: Temperature 98.1, pulse 87, respiratory rate. 20, blood pressure 180/113.   GENERAL: Patient is uncomfortable, pacing around the room holding the right side.  HEENT: Normocephalic, atraumatic.   NECK: Supple without any lymphadenopathy.   LYMPHATIC: No additional lymphadenopathy.   CHEST: Clear.   CARDIOVASCULAR: Regular rate and rhythm. 2+ upper and lower extremity pulses x4.   ABDOMEN: Soft, nontender, nondistended. She does have right costovertebral angle tenderness.   GENITOURINARY: Deferred.   RECTAL: Deferred.   EXTREMITIES: Lower extremity with no edema. 2+ pulses.   MUSCULOSKELETAL: 5/5 strength.   PSYCH: Appropriate mood.   LABORATORY,  DIAGNOSTIC AND RADIOLOGICAL DATA: Creatinine 0.79. Lytes within normal limits.   Imaging reviewed in history of present illness. Images reviewed by myself.   ASSESSMENT: 49 year old with obstructing right proximal ureteral stone and intractable nausea and vomiting and pain in the ER. Patient counseled on observation versus ureteral stent placement. She understands no definitive treatment of the stone will be performed today. Patient requests stent placement.   PLAN: 1. Plan for cystoscopy with insertion of right double-J stent.  2. Admission for pain control and antiemetics.  3. Home once pain and nausea resolve.  4. Outpatient treatment of stone.  5. Informed consent obtained for procedure including risks, benefits and alternatives.  ____________________________ Juliann Mule Nobie Putnam, MD erh:cms D: 02/07/2012 17:19:51 ET T: 02/08/2012 05:43:45 ET  JOB#: [id number] Carroll Sage MD ELECTRONICALLY SIGNED 02/15/2012 9:54

## 2015-03-03 NOTE — Op Note (Signed)
PATIENT NAME:  [name], [name] MR#:  [id number] DATE OF BIRTH:  [birth year]  DATE OF PROCEDURE:  02/07/2012  PREOPERATIVE DIAGNOSIS: Right renal colic secondary to obstructing ureteral stone.   POSTOPERATIVE DIAGNOSIS: Right renal colic secondary to obstructing ureteral stone.   PROCEDURE PERFORMED:  1. Cystoscopy. 2. Right retrograde pyelogram. 3. Right ureteral stent placement.   SURGEON: Juliann Mule. Nobie Putnam, MD   ASSISTANT: None.  ANESTHESIA: MAC.  COMPLICATIONS: None.   ESTIMATED BLOOD LOSS: Minimal to none.  DRAINS: 4.8 French x 26 cm right double J-stent.   FINDINGS: Multiple radiopaque UPJ stones on the right.  INDICATION FOR OPERATION: The patient is a 49 year old female who presented to [facility] ER with acute renal colic, intractable pain, nausea, and vomiting. She was unable to be adequately pain controlled in the ER, therefore, she was admitted and consented regarding cysto ureteral stent placement. She understands no definitive stone management will be done today and attempts will be made at alleviating her pain and nausea. All risks, benefits, and alternatives of stent placement were fully explained to the patient preoperatively and she provided informed consent.   DESCRIPTION OF PROCEDURE: The patient was identified in the preoperative holding area where she received preoperative antibiotics. She was then brought back to the operating room and placed under sedation without difficulty. She was then prepped and draped in sterile lithotomy position and all pressure points padded appropriately. Time-out was then called confirming operative site, operation to be performed, and correct patient. All present were in agreement. Attention was then turned to the cysto portion of the case. A 22.5 French cystoscope was used in the bladder. Bladder was found to be without mass or lesion. The right ureteral orifice was identified and easily cannulated with a  0.38 glidewire. A 5 French ureteral catheter was then placed in the renal pelvis where two radiopaque stones were seen in the UPJ, one around 7 mm and one around 4 mm. Right retrograde pyelogram was then shot showing mild right hydronephrosis and hydroureter. The wire was replaced through the 5 [place] open ureteral catheter and a 5 French open ureteral catheter was removed. Then using a combination of direct visualization and fluoroscopic imaging, a 4.8 x 26 cm double-J stent was placed with excellent coil noted in the right renal pelvis. Excellent coil noted distally visualizing the bladder. Bladder was drained. The patient was awakened from sedation, transitioned to PAC-U in stable condition.   DISPOSITION: The patient will be admitted overnight for observation and discharged home if her pain control is adequate in the morning.   ____________________________ Juliann Mule. Nobie Putnam, MD erh:drc D: 02/07/2012 18:19:45 ET T: 02/08/2012 08:02:26 ET JOB#: [id number]  cc: Juliann Mule. Nobie Putnam, MD, <Dictator> Carroll Sage MD ELECTRONICALLY SIGNED 02/15/2012 9:54

## 2015-03-11 ENCOUNTER — Ambulatory Visit (INDEPENDENT_AMBULATORY_CARE_PROVIDER_SITE_OTHER): Payer: 59 | Admitting: Nurse Practitioner

## 2015-03-11 ENCOUNTER — Encounter: Payer: Self-pay | Admitting: Nurse Practitioner

## 2015-03-11 VITALS — BP 130/86 | HR 96 | Temp 98.4°F | Ht 68.5 in | Wt 311.0 lb

## 2015-03-11 DIAGNOSIS — E1165 Type 2 diabetes mellitus with hyperglycemia: Secondary | ICD-10-CM

## 2015-03-11 DIAGNOSIS — E669 Obesity, unspecified: Secondary | ICD-10-CM | POA: Diagnosis not present

## 2015-03-11 DIAGNOSIS — R748 Abnormal levels of other serum enzymes: Secondary | ICD-10-CM

## 2015-03-11 DIAGNOSIS — IMO0002 Reserved for concepts with insufficient information to code with codable children: Secondary | ICD-10-CM

## 2015-03-11 NOTE — Assessment & Plan Note (Signed)
No effort for diet/exercise. Pt not ready to. FU in 3 months.

## 2015-03-11 NOTE — Patient Instructions (Signed)
Follow up in 3 months again for diabetes and will obtain A1c.

## 2015-03-11 NOTE — Progress Notes (Signed)
   Subjective:    Patient ID: [name], female    DOB: February 05, 1966, 49 y.o.   MRN: [id number]  HPI  [name] is a 49 yo female with a follow up for diabetes type 2.   1) Fasting BS from home- 200-300's  Levemir   70/30 Novolin  Eye exam- 2015 in June Foot exam done in Feb., A1c in feb was 11.1 Today last dose tramadol  She does not wish to continue.  No diet nor exercise   Review of Systems  Constitutional: Negative for fever, chills, diaphoresis and fatigue.  Respiratory: Negative for chest tightness, shortness of breath and wheezing.   Cardiovascular: Negative for chest pain, palpitations and leg swelling.  Gastrointestinal: Negative for nausea, vomiting and diarrhea.  Endocrine: Negative for polydipsia, polyphagia and polyuria.  Skin: Negative for rash.  Neurological: Negative for dizziness, weakness, numbness and headaches.      Objective:   Physical Exam  Constitutional: She is oriented to person, place, and time. She appears well-developed and well-nourished. No distress.  BP 130/86 mmHg  Pulse 96  Temp(Src) 98.4 F (36.9 C) (Oral)  Ht 5' 8.5" (1.74 m)  Wt 311 lb (141.069 kg)  BMI 46.59 kg/m2  SpO2 94%   HENT:  Head: Normocephalic and atraumatic.  Right Ear: External ear normal.  Left Ear: External ear normal.  Eyes: EOM are normal. Pupils are equal, round, and reactive to light. Right eye exhibits no discharge. Left eye exhibits no discharge. No scleral icterus.  Cardiovascular: Normal rate, regular rhythm and normal heart sounds.  Exam reveals no gallop and no friction rub.   No murmur heard. Pulmonary/Chest: Effort normal and breath sounds normal. No respiratory distress. She has no wheezes. She has no rales. She exhibits no tenderness.  Neurological: She is alert and oriented to person, place, and time. No cranial nerve deficit. She exhibits normal muscle tone. Coordination normal.  Skin: Skin is warm and dry. No rash noted. She is not  diaphoretic.  Psychiatric: Her behavior is normal. Judgment and thought content normal.  Flat affect this morning      Assessment & Plan:

## 2015-03-11 NOTE — Progress Notes (Signed)
Pre visit review using our clinic review tool, if applicable. No additional management support is needed unless otherwise documented below in the visit note. 

## 2015-03-11 NOTE — Assessment & Plan Note (Signed)
Repeat in 3 months

## 2015-03-11 NOTE — Assessment & Plan Note (Signed)
Pt does not wish to have another A1c drawn. She is non-compliant and does not want to diet/exercise. She understands the complications from uncontrolled diabetes. Will FU in 3 months.

## 2015-03-22 ENCOUNTER — Emergency Department
Admission: EM | Admit: 2015-03-22 | Discharge: 2015-03-22 | Disposition: A | Discharge status: Home / Self Care | Payer: 59 | Attending: Emergency Medicine | Admitting: Emergency Medicine

## 2015-03-22 ENCOUNTER — Telehealth: Payer: Self-pay | Admitting: Nurse Practitioner

## 2015-03-22 ENCOUNTER — Emergency Department: Payer: 59

## 2015-03-22 ENCOUNTER — Encounter: Payer: Self-pay | Admitting: Emergency Medicine

## 2015-03-22 DIAGNOSIS — I10 Essential (primary) hypertension: Secondary | ICD-10-CM | POA: Diagnosis not present

## 2015-03-22 DIAGNOSIS — E119 Type 2 diabetes mellitus without complications: Secondary | ICD-10-CM | POA: Insufficient documentation

## 2015-03-22 DIAGNOSIS — Z794 Long term (current) use of insulin: Secondary | ICD-10-CM | POA: Insufficient documentation

## 2015-03-22 DIAGNOSIS — R609 Edema, unspecified: Secondary | ICD-10-CM

## 2015-03-22 DIAGNOSIS — Z79899 Other long term (current) drug therapy: Secondary | ICD-10-CM | POA: Insufficient documentation

## 2015-03-22 DIAGNOSIS — M7981 Nontraumatic hematoma of soft tissue: Secondary | ICD-10-CM | POA: Insufficient documentation

## 2015-03-22 DIAGNOSIS — M79662 Pain in left lower leg: Secondary | ICD-10-CM | POA: Insufficient documentation

## 2015-03-22 DIAGNOSIS — M79605 Pain in left leg: Secondary | ICD-10-CM | POA: Diagnosis present

## 2015-03-22 DIAGNOSIS — R600 Localized edema: Secondary | ICD-10-CM | POA: Diagnosis not present

## 2015-03-22 DIAGNOSIS — Z792 Long term (current) use of antibiotics: Secondary | ICD-10-CM | POA: Insufficient documentation

## 2015-03-22 DIAGNOSIS — M791 Myalgia, unspecified site: Secondary | ICD-10-CM

## 2015-03-22 LAB — CBC WITH DIFFERENTIAL/PLATELET
Basophils Absolute: 0.1 10*3/uL (ref 0–0.1)
Basophils Relative: 1 %
Eosinophils Absolute: 0.2 10*3/uL (ref 0–0.7)
Eosinophils Relative: 3 %
HCT: 43.9 % (ref 35.0–47.0)
Hemoglobin: 14.7 g/dL (ref 12.0–16.0)
Lymphocytes Relative: 40 %
Lymphs Abs: 2.9 10*3/uL (ref 1.0–3.6)
MCH: 30 pg (ref 26.0–34.0)
MCHC: 33.4 g/dL (ref 32.0–36.0)
MCV: 89.8 fL (ref 80.0–100.0)
Monocytes Absolute: 0.5 10*3/uL (ref 0.2–0.9)
Monocytes Relative: 8 %
Neutro Abs: 3.4 10*3/uL (ref 1.4–6.5)
Neutrophils Relative %: 48 %
Platelets: 187 10*3/uL (ref 150–440)
RBC: 4.89 MIL/uL (ref 3.80–5.20)
RDW: 13.3 % (ref 11.5–14.5)
WBC: 7.2 10*3/uL (ref 3.6–11.0)

## 2015-03-22 LAB — BASIC METABOLIC PANEL
Anion gap: 9 (ref 5–15)
BUN: 16 mg/dL (ref 6–20)
CO2: 26 mmol/L (ref 22–32)
Calcium: 9.2 mg/dL (ref 8.9–10.3)
Chloride: 99 mmol/L — ABNORMAL LOW (ref 101–111)
Creatinine, Ser: 0.65 mg/dL (ref 0.44–1.00)
GFR calc Af Amer: >60 mL/min (ref >60)
GFR calc non Af Amer: >60 mL/min (ref >60)
Glucose, Bld: 285 mg/dL — ABNORMAL HIGH (ref 65–99)
Potassium: 4.2 mmol/L (ref 3.5–5.1)
Sodium: 134 mmol/L — ABNORMAL LOW (ref 135–145)

## 2015-03-22 LAB — PROTIME-INR
INR: 0.92
Prothrombin Time: 12.6 seconds (ref 11.4–15.0)

## 2015-03-22 NOTE — ED Provider Notes (Signed)
[facility] Emergency Department Provider Note ?____________________________________________ ? Time seen: 4081 ? I have reviewed the triage vital signs and the nursing notes. ________ HISTORY ? Chief Complaint Leg Pain  HPI  [name] is a 49 y.o. female who reports to the ED for evaluation of left calf pain over the last 5-7 days. She reports onset of Swelling which is slightly above her baseline last weekend without known cause. 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 For that she takes a 12.5 mg HCTZ tablet daily, and is expected to have that dose increased by her primary care provider next month. She denies any known direct trauma to the legs, but notes she is tender to touch over the posterior calf region. She had her daughter look at her calf, and her daughter reported reported some bruising noted to the calf. She called her primary care provider to make an appointment, and they recommended that she reported the ED for evaluation she is made a follow-up appointment on Monday with her PCP.  Past Medical History  Diagnosis Date  . Asthma   . Hypertension   . Hyperlipidemia   . Migraine     Has used imitrex  . Kidney stone   . Diabetes mellitus without complication    Patient Active Problem List   Diagnosis Date Noted  . Swelling of joint of left wrist 01/21/2015  . Nocturnal leg cramps 01/21/2015  . Abscess of right hand 10/24/2014  . Elevated liver enzymes 09/04/2014  . Vitamin D deficiency 09/03/2014  . Need for prophylactic vaccination against Streptococcus pneumoniae (pneumococcus) 06/18/2014  . Encounter for routine gynecological examination 06/18/2014  . Diabetes type 2, uncontrolled 01/22/2014  . Cough 01/22/2014  . Routine general medical examination at a health care facility 01/11/2014  . Screening for breast cancer 01/11/2014  . Numbness and tingling of both legs 01/11/2014  . Knee  pain, bilateral 01/11/2014  . Obesity 01/11/2014  . HTN (hypertension) 01/11/2014  ? Past Surgical History  Procedure Laterality Date  . Cesarean section    . Tonsillectomy and adenoidectomy    ? Current Outpatient Rx  Name  Route  Sig  Dispense  Refill  . Blood Glucose Monitoring Suppl (ONE TOUCH ULTRA SYSTEM KIT) W/DEVICE KIT   Does not apply   1 kit by Does not apply route once.   1 each   0   . Coenzyme Q10-Red Yeast Rice (CO Q-10 PLUS RED YEAST RICE) 60-600 MG CAPS   Oral   Take by mouth 2 (two) times daily.         . ergocalciferol (DRISDOL) 50000 UNITS capsule   Oral   Take 1 capsule (50,000 Units total) by mouth once a week. Take once weekly for 12 weeks   4 capsule   2   . glucose blood test strip      Check sugar 4 times daily   400 each   1     * One touch ultra Test Strips*   . hydrochlorothiazide (HYDRODIURIL) 12.5 MG tablet   Oral   Take 1 tablet (12.5 mg total) by mouth daily.   90 tablet   3   . insulin NPH-regular Human (NOVOLIN 70/30) (70-30) 100 UNIT/ML injection   Subcutaneous   Inject 15 Units into the skin 2 (two) times daily with a meal.   30 mL   2   . Insulin Pen Needle 32G X 6 MM MISC  Subcutaneous   Inject 1 applicator into the skin at bedtime.   100 each   1     Dx 250.00   . Insulin Syringe-Needle U-100 (INSULIN SYRINGE .5CC/30GX5/16") 30G X 5/16" 0.5 ML MISC      Insulin syringes to be used for humulin R dx E11.65   300 each   3   . LEVEMIR FLEXTOUCH 100 UNIT/ML Pen      Inject 24 units in the  morning and 10 units in the evening   45 mL   1   . lisinopril (PRINIVIL,ZESTRIL) 20 MG tablet      TAKE 1 TABLET (20 MG TOTAL) BY MOUTH DAILY.   90 tablet   2   . metFORMIN (GLUCOPHAGE) 500 MG tablet   Oral   Take 1 tablet (500 mg total) by mouth 2 (two) times daily with a meal.   180 tablet   1   . mupirocin ointment (BACTROBAN) 2 %   Topical   Apply 1 application topically 3 (three) times daily.   22 g   0    . traMADol (ULTRAM) 50 MG tablet   Oral   Take 1 tablet (50 mg total) by mouth every 8 (eight) hours as needed.   30 tablet   2   ? Allergies Nsaids ? Family History  Problem Relation Age of Onset  . Alcohol abuse Mother   . Hyperlipidemia Mother   . Hypertension Mother   . Hyperlipidemia Father   . Hypertension Father   . Parkinson's disease Father   . Drug abuse Sister   . Cancer Maternal Grandmother     breast cancer  . Cancer Paternal Grandmother   ? Social History History  Substance Use Topics  . Smoking status: Never Smoker   . Smokeless tobacco: Never Used  . Alcohol Use: No   Review of Systems Constitutional: Negative for fever. HEENT:  Normocephalic/atraumatic. Negative for visual/hearingchanges, sore throat, or nasal congestion. Cardiovascular: Negative for chest pain. Respiratory: Negative for shortness of breath. Musculoskeletal: Negative for back pain. Positive for calf pain and baseline peripheral edema Skin: Denies laceration.  Neurological: Negative for headaches, focal weakness or numbness. Hematological/Lymphatic:Negative for enlarged lymph nodes  10-point ROS otherwise negative. ____________________________________________  PHYSICAL EXAM:  VITAL SIGNS: ED Triage Vitals  Enc Vitals Group     BP 03/22/15 1623 147/88 mmHg     Pulse Rate 03/22/15 1623 96     Resp 03/22/15 1623 16     Temp 03/22/15 1623 97.8 F (36.6 C)     Temp Source 03/22/15 1623 Oral     SpO2 03/22/15 1623 95 %     Weight 03/22/15 1623 311 lb (141.069 kg)     Height 03/22/15 1623 $RemoveBefor'5\' 8"'vOrdTqLPtyzU$  (1.727 m)     Head Cir --      Peak Flow --      Pain Score 03/22/15 1622 9     Pain Loc --      Pain Edu? --      Excl. in [place]? --    Constitutional: Alert and oriented. Well appearing and in no distress. HEENT: Normocephalic and atraumatic.Conjunctivae are normal. PERRL. Normal extraocular movements. Mucous membranes are moist. Hematological/Lymphatic/Immunilogical: No cervical  lymphadenopathy. Cardiovascular: Normal rate, regular rhythm.No murmurs, rubs, or gallops. \ Respiratory: Normal respiratory effort. Breath sounds are clear and equal bilaterally. No wheezes/rales/rhonchi. Gastrointestinal: Soft and nontender. No distention.  Genitourinary: deferred Musculoskeletal: Nontender with normal range of motion in all extremities. No joint effusions.  Left lower extremity tenderness to the calf and bilateral pitting edema. Neurologic:  Normal speech and language. No gross focal neurologic deficits are appreciated.  Skin:  Su[erficial, scattered eechymosis to the left calf. No erythema, induration, lesion, or laceration. Psychiatric: Mood and affect are normal. Speech and behavior are normal. Patient exhibits appropriate insight and judgment. _____________ PROCEDURES ? LLE Doppler/US  IMPRESSION: No evidence of deep venous thrombosis  Labs  Labs Reviewed  BASIC METABOLIC PANEL - Abnormal; Notable for the following:    Sodium 134 (*)    Chloride 99 (*)    Glucose, Bld 285 (*)    All other components within normal limits  CBC WITH DIFFERENTIAL/PLATELET  PROTIME-INR   ______________________________________________________ INITIAL IMPRESSION / ASSESSMENT AND PLAN / ED COURSE ? Left calf superficial tenderness. No DVT evidence on exam.  Labs normal. Patient reassured about exam, labs, and diagnostic evidence with low indication of infectious or vascular process.  Keep appointment with PCP for Monday as scheduled.  Pertinent labs & imaging results that were available during my care of the patient were reviewed by me and considered in my medical decision making (see chart for details).  ____________________________________________ FINAL CLINICAL IMPRESSION(S) / ED DIAGNOSES?  Final diagnoses:  Calf pain, left  Pain in the muscles  Peripheral edema      Melvenia Needles, PA-C 03/22/15 1925  Lavonia Drafts, MD 03/22/15 [phone]

## 2015-03-22 NOTE — Discharge Instructions (Signed)
Edema °Edema is an abnormal buildup of fluids in your body tissues. Edema is somewhat dependent on gravity to pull the fluid to the lowest place in your body. That makes the condition more common in the legs and thighs (lower extremities). Painless swelling of the feet and ankles is common and becomes more likely as you get older. It is also common in looser tissues, like around your eyes.  °When the affected area is squeezed, the fluid may move out of that spot and leave a dent for a few moments. This dent is called pitting.  °CAUSES  °There are many possible causes of edema. Eating too much salt and being on your feet or sitting for a long time can cause edema in your legs and ankles. Hot weather may make edema worse. Common medical causes of edema include: °· Heart failure. °· Liver disease. °· Kidney disease. °· Weak blood vessels in your legs. °· Cancer. °· An injury. °· Pregnancy. °· Some medications. °· Obesity.  °SYMPTOMS  °Edema is usually painless. Your skin may look swollen or shiny.  °DIAGNOSIS  °Your health care provider may be able to diagnose edema by asking about your medical history and doing a physical exam. You may need to have tests such as X-rays, an electrocardiogram, or blood tests to check for medical conditions that may cause edema.  °TREATMENT  °Edema treatment depends on the cause. If you have heart, liver, or kidney disease, you need the treatment appropriate for these conditions. General treatment may include: °· Elevation of the affected body part above the level of your heart. °· Compression of the affected body part. Pressure from elastic bandages or support stockings squeezes the tissues and forces fluid back into the blood vessels. This keeps fluid from entering the tissues. °· Restriction of fluid and salt intake. °· Use of a water pill (diuretic). These medications are appropriate only for some types of edema. They pull fluid out of your body and make you urinate more often. This  gets rid of fluid and reduces swelling, but diuretics can have side effects. Only use diuretics as directed by your health care provider. °HOME CARE INSTRUCTIONS  °· Keep the affected body part above the level of your heart when you are lying down.   °· Do not sit still or stand for prolonged periods.   °· Do not put anything directly under your knees when lying down. °· Do not wear constricting clothing or garters on your upper legs.   °· Exercise your legs to work the fluid back into your blood vessels. This may help the swelling go down.   °· Wear elastic bandages or support stockings to reduce ankle swelling as directed by your health care provider.   °· Eat a low-salt diet to reduce fluid if your health care provider recommends it.   °· Only take medicines as directed by your health care provider.  °SEEK MEDICAL CARE IF:  °· Your edema is not responding to treatment. °· You have heart, liver, or kidney disease and notice symptoms of edema. °· You have edema in your legs that does not improve after elevating them.   °· You have sudden and unexplained weight gain. °SEEK IMMEDIATE MEDICAL CARE IF:  °· You develop shortness of breath or chest pain.   °· You cannot breathe when you lie down. °· You develop pain, redness, or warmth in the swollen areas.   °· You have heart, liver, or kidney disease and suddenly get edema. °· You have a fever and your symptoms suddenly get worse. °MAKE SURE YOU:  °·   Understand these instructions.  Will watch your condition.  Will get help right away if you are not doing well or get worse. Document Released: 10/26/2005 Document Revised: 03/12/2014 Document Reviewed: 08/18/2013 [facility] Patient Information 2015 [place], [place]. This information is not intended to replace advice given to you by your health care provider. Make sure you discuss any questions you have with your health care provider.  Muscle Pain Muscle pain (myalgia) may be caused by many things,  including:  Overuse or muscle strain, especially if you are not in shape. This is the most common cause of muscle pain.  Injury.  Bruises.  Viruses, such as the flu.  Infectious diseases.  Fibromyalgia, which is a chronic condition that causes muscle tenderness, fatigue, and headache.  Autoimmune diseases, including lupus.  Certain drugs, including ACE inhibitors and statins. Muscle pain may be mild or severe. In most cases, the pain lasts only a short time and goes away without treatment. To diagnose the cause of your muscle pain, your health care provider will take your medical history. This means he or she will ask you when your muscle pain began and what has been happening. If you have not had muscle pain for very long, your health care provider may want to wait before doing much testing. If your muscle pain has lasted a long time, your health care provider may want to run tests right away. If your health care provider thinks your muscle pain may be caused by illness, you may need to have additional tests to rule out certain conditions.  Treatment for muscle pain depends on the cause. Home care is often enough to relieve muscle pain. Your health care provider may also prescribe anti-inflammatory medicine. HOME CARE INSTRUCTIONS Watch your condition for any changes. The following actions may help to lessen any discomfort you are feeling:  Only take over-the-counter or prescription medicines as directed by your health care provider.  Apply ice to the sore muscle:  Put ice in a plastic bag.  Place a towel between your skin and the bag.  Leave the ice on for 15-20 minutes, 3-4 times a day.  You may alternate applying hot and cold packs to the muscle as directed by your health care provider.  If overuse is causing your muscle pain, slow down your activities until the pain goes away.  Remember that it is normal to feel some muscle pain after starting a workout program. Muscles that  have not been used often will be sore at first.  Do regular, gentle exercises if you are not usually active.  Warm up before exercising to lower your risk of muscle pain.  Do not continue working out if the pain is very bad. Bad pain could mean you have injured a muscle. SEEK MEDICAL CARE IF:  Your muscle pain gets worse, and medicines do not help.  You have muscle pain that lasts longer than 3 days.  You have a rash or fever along with muscle pain.  You have muscle pain after a tick bite.  You have muscle pain while working out, even though you are in good physical condition.  You have redness, soreness, or swelling along with muscle pain.  You have muscle pain after starting a new medicine or changing the dose of a medicine. SEEK IMMEDIATE MEDICAL CARE IF:  You have trouble breathing.  You have trouble swallowing.  You have muscle pain along with a stiff neck, fever, and vomiting.  You have severe muscle weakness or cannot  move part of your body. MAKE SURE YOU:   Understand these instructions.  Will watch your condition.  Will get help right away if you are not doing well or get worse. Document Released: 09/17/2006 Document Revised: 10/31/2013 Document Reviewed: 08/22/2013 [facility] Patient Information 2015 [place], [place]. This information is not intended to replace advice given to you by your health care provider. Make sure you discuss any questions you have with your health care provider.   Your exam, labs, and ultrasound are normal today. Keep your appointment with your provider as scheduled.

## 2015-03-22 NOTE — ED Notes (Signed)
Pt. In from the front with c/o of left calf.  Pt. States pain to lt. Calf for 1 week.  Pt. States non-traumatic injury to lt. Leg.  No injury observed to lt. Leg.  Pt. States chronic swelling to lt. Leg.

## 2015-03-22 NOTE — ED Notes (Addendum)
Patient c/o pain to left calf since last weekend. Red and warm to touch. Hx diabetes. Denies any hx cellulitis or blood clots. Able to ambulate.

## 2015-03-22 NOTE — Telephone Encounter (Signed)
[facility] Patient Name: [name] DOB: 09-Apr-1966 Initial Comment Caller says that the back of her leg has been bothering for a while, and has gotten worse the last week. Has a huge bruise that is hot to the touch. No injury. Concerned it may be a clot. She is diabetic Nurse Assessment Nurse: Martyn Ehrich, RN, Felicia Date/Time (Eastern Time): 03/22/2015 3:44:31 PM Confirm and document reason for call. If symptomatic, describe symptoms. ---PT has a bruise on back of L leg - could it be clot. Painful - right below calf above ankle onset off and on for awhile to touch - in last week hurts when not being touched - severe last 2 d Has the patient traveled out of the country within the last 30 days? ---No Does the patient require triage? ---Yes Related visit to physician within the last 2 weeks? ---No Does the PT have any chronic conditions? (i.e. diabetes, asthma, etc.) ---Yes List chronic conditions. ---DMDid the patient indicate they were pregnant? ---No Guidelines Guideline Title Affirmed Question Affirmed Notes Leg Pain [1] SEVERE pain (e.g., excruciating, unable to do any normal activities) AND [2] not improved after 2 hours of pain medicine Final Disposition User See Physician within 4 Hours (or PCP triage) Martyn Ehrich, RN, Felicia Comments She goes to [facility] [employer]? she thinks [facility] to go to ER bc of degree of symptoms - pain. She will go to [facility] ER  Call Id: [phone]

## 2015-03-25 ENCOUNTER — Encounter: Payer: Self-pay | Admitting: Nurse Practitioner

## 2015-03-25 ENCOUNTER — Ambulatory Visit (INDEPENDENT_AMBULATORY_CARE_PROVIDER_SITE_OTHER): Payer: 59 | Admitting: Nurse Practitioner

## 2015-03-25 VITALS — BP 116/76 | HR 91 | Temp 98.2°F | Resp 97 | Ht 68.5 in | Wt 308.8 lb

## 2015-03-25 DIAGNOSIS — M79662 Pain in left lower leg: Secondary | ICD-10-CM

## 2015-03-25 DIAGNOSIS — R109 Unspecified abdominal pain: Secondary | ICD-10-CM | POA: Diagnosis not present

## 2015-03-25 NOTE — Patient Instructions (Signed)
Magnesium This is a blood test which measures the amount of magnesium in your blood. Most of the magnesium in your body exists in your cells. However, the magnesium in your blood is important for many processes. It is important for your nerves to be able to conduct electrical energy. This is important in heart patients with higher heartbeats. When your heart beats and then gets ready to beat again, it repolarizes. If the magnesium levels are low or high, it can affect the accuracy of the repolarization process. The magnesium levels are also monitored during pregnancy. This is done to determine if the expectant mother may have preeclampsia or toxemia. Magnesium is often used for treatment of these problems. PREPARATION FOR TEST No preparation or fasting is needed. A blood sample may be taken by inserting a needle into a vein in the arm.  NORMAL FINDINGS  Adult: 1.3 to 2.1 mEq/L or 0.65 to 1.05 mmol/L (SI units)  Child: 1.4 to 1.7 mEq/L  Newborn: 1.4 to 2 mEq/L Possible critical values: less than 0.5 mEq/L or greater than 3 mEq/L Ranges for normal findings may vary among different laboratories and hospitals. You should always check with your caregiver after having lab work or other tests done to discuss the meaning of your test results and whether your values are considered within normal limits. MEANING OF TEST  Your caregiver will go over the test results with you. Your caregiver will discuss the importance and meaning of your results. He or she will also discuss treatment options and additional tests, if needed. OBTAINING THE TEST RESULTS It is your responsibility to obtain your test results. Ask the lab or department performing the test when and how you will get your results. Document Released: 11/28/2004 Document Revised: 01/18/2012 Document Reviewed: 10/06/2008 [facility] Patient Information 2015 [place], [place]. This information is not intended to replace advice given to you by your health care  provider. Make sure you discuss any questions you have with your health care provider.

## 2015-03-25 NOTE — Progress Notes (Signed)
   Subjective:    Patient ID: [name], female    DOB: December 28, 1965, 48 y.o.   MRN: [id number]  HPI  Ms. Zanella is a 49 yo female with a CC of left leg pain in calf x 8 days bruised and warm.   1) [facility] ED on 03/22/15. Pt was sent by [facility].   [place] of LLE was found to be negative for DVT, no signs of vascular or infectious disease of the lower extremity.   Pt feels Magnesium supplement makes her left kidney hurt when she takes it. It has happened twice.   Review of Systems  Constitutional: Negative for fever, chills, diaphoresis and fatigue.  Respiratory: Negative for chest tightness, shortness of breath and wheezing.   Cardiovascular: Negative for chest pain, palpitations and leg swelling.  Gastrointestinal: Negative for nausea, vomiting and diarrhea.  Musculoskeletal: Positive for myalgias.       Left calf pain   Skin: Negative for rash.  Neurological: Negative for dizziness, weakness, numbness and headaches.  Psychiatric/Behavioral: The patient is not nervous/anxious.       Objective:   Physical Exam  Constitutional: She is oriented to person, place, and time. She appears well-developed and well-nourished. No distress.  BP 116/76 mmHg  Pulse 91  Temp(Src) 98.2 F (36.8 C) (Oral)  Resp 97  Ht 5' 8.5" (1.74 m)  Wt 308 lb 12.8 oz (140.071 kg)  BMI 46.26 kg/m2  LMP 02/27/2015   HENT:  Head: Normocephalic and atraumatic.  Right Ear: External ear normal.  Left Ear: External ear normal.  Cardiovascular: Normal rate, regular rhythm, normal heart sounds and intact distal pulses.  Exam reveals no gallop and no friction rub.   No murmur heard. Pulmonary/Chest: Effort normal and breath sounds normal. No respiratory distress. She has no wheezes. She has no rales. She exhibits no tenderness.  Musculoskeletal: Normal range of motion. She exhibits no edema or tenderness.  Negative homans, no external findings, tenderness to palpation still exists.   Neurological: She is  alert and oriented to person, place, and time. No cranial nerve deficit. She exhibits normal muscle tone. Coordination normal.  Skin: Skin is warm and dry. No rash noted. She is not diaphoretic.  Psychiatric: She has a normal mood and affect. Her behavior is normal. Judgment and thought content normal.      Assessment & Plan:

## 2015-03-25 NOTE — Progress Notes (Signed)
Pre visit review using our clinic review tool, if applicable. No additional management support is needed unless otherwise documented below in the visit note. 

## 2015-04-07 DIAGNOSIS — M79662 Pain in left lower leg: Secondary | ICD-10-CM | POA: Insufficient documentation

## 2015-04-07 NOTE — Assessment & Plan Note (Signed)
Checking magnesium, amylase, and lipase levels at [facility]. Will follow.

## 2015-04-07 NOTE — Assessment & Plan Note (Signed)
No findings in the hospital. Ruled out DVT and infection. May be msk in nature.

## 2015-05-20 ENCOUNTER — Ambulatory Visit (INDEPENDENT_AMBULATORY_CARE_PROVIDER_SITE_OTHER): Payer: 59 | Admitting: Nurse Practitioner

## 2015-05-20 VITALS — BP 142/98 | HR 85 | Temp 98.5°F | Resp 16 | Ht 69.0 in | Wt 310.6 lb

## 2015-05-20 DIAGNOSIS — E1165 Type 2 diabetes mellitus with hyperglycemia: Secondary | ICD-10-CM

## 2015-05-20 DIAGNOSIS — E669 Obesity, unspecified: Secondary | ICD-10-CM

## 2015-05-20 DIAGNOSIS — I10 Essential (primary) hypertension: Secondary | ICD-10-CM

## 2015-05-20 DIAGNOSIS — IMO0002 Reserved for concepts with insufficient information to code with codable children: Secondary | ICD-10-CM

## 2015-05-20 DIAGNOSIS — E559 Vitamin D deficiency, unspecified: Secondary | ICD-10-CM

## 2015-05-20 LAB — HEPATIC FUNCTION PANEL
ALT: 97 U/L — AB (ref 7–35)
AST: 62 U/L — AB (ref 13–35)
Alkaline Phosphatase: 59 U/L (ref 25–125)
Bilirubin, Direct: 0.13 mg/dL (ref 0.01–0.4)
Bilirubin, Total: 0.4 mg/dL

## 2015-05-20 LAB — POCT ERYTHROCYTE SEDIMENTATION RATE, NON-AUTOMATED: Sed Rate: 17 mm

## 2015-05-20 LAB — HEMOGLOBIN A1C: Hgb A1c MFr Bld: 11.3 % — AB (ref 4.0–6.0)

## 2015-05-20 NOTE — Progress Notes (Signed)
Pre visit review using our clinic review tool, if applicable. No additional management support is needed unless otherwise documented below in the visit note. 

## 2015-05-20 NOTE — Assessment & Plan Note (Signed)
BP Readings from Last 3 Encounters:  05/20/15 142/98  03/25/15 116/76  03/22/15 112/57   Pt had held BP medications today. Will follow.

## 2015-05-20 NOTE — Assessment & Plan Note (Signed)
Checking a Vitamin D level today, pt reporting decreased leg cramping.

## 2015-05-20 NOTE — Patient Instructions (Addendum)
We will contact you about your referral to Dr. Gabriel Carina at [facility] for endocrinology care.   Please begin to slowly add your metformin back into your regimen. Add another tablet every 3-4 days (5-7 is okay, too) to slowly get back to where you were.

## 2015-05-20 NOTE — Progress Notes (Signed)
   Subjective:    Patient ID: [name], female    DOB: [birth year], 49 y.o.   MRN: [id number]  HPI  Ms. Gaylord is a 49 yo female with a diabetic follow up.   1) Eye exam- Due in August  Foot exam- 12/10/14  A1c- Feb 16th 2016 11.1  250-300 BS daily she reports  Metformin- went off for 1 month and BS shot up to 600; took 3-4 days with horrible diarrhea   Kept insulin the same   Taking: Vitamin C, Vitamin D3, Tumeric  And a host of herbs/supplements   Started supplements 1 month ago- eating better, sleeping better  BS moved into low 200's  180's- and she was very hungry and exhausted she reports  Not taken medication today- Metformin or BP meds Walking more often since last visit, but stopped recently  Frustration stopped being diligent on diet  Review of Systems  Constitutional: Positive for fatigue. Negative for fever, chills and diaphoresis.  Respiratory: Negative for chest tightness, shortness of breath and wheezing.   Cardiovascular: Negative for chest pain, palpitations and leg swelling.  Gastrointestinal: Positive for diarrhea. Negative for nausea and vomiting.  Endocrine: Positive for polydipsia, polyphagia and polyuria.  Musculoskeletal: Positive for myalgias.  Skin: Negative for rash.  Neurological: Negative for dizziness, weakness, numbness and headaches.  Psychiatric/Behavioral: Positive for sleep disturbance. Negative for suicidal ideas. The patient is not nervous/anxious.       Objective:   Physical Exam  Constitutional: She is oriented to person, place, and time. She appears well-developed and well-nourished. No distress.  BP 142/98 mmHg  Pulse 85  Temp(Src) 98.5 F (36.9 C)  Resp 16  Ht 5\' 9"  (1.753 m)  Wt 310 lb 9.6 oz (140.887 kg)  BMI 45.85 kg/m2  SpO2 96%   HENT:  Head: Normocephalic and atraumatic.  Right Ear: External ear normal.  Left Ear: External ear normal.  Eyes: EOM are normal. Pupils are equal, round, and reactive to light.  Right eye exhibits no discharge. Left eye exhibits no discharge. No scleral icterus.  Neck: Normal range of motion. Neck supple. No thyromegaly present.  Cardiovascular: Normal rate, regular rhythm and normal heart sounds.  Exam reveals no gallop and no friction rub.   No murmur heard. Pulmonary/Chest: Effort normal and breath sounds normal. No respiratory distress. She has no wheezes. She has no rales. She exhibits no tenderness.  Abdominal: Soft. Bowel sounds are normal. She exhibits no distension and no mass. There is no tenderness. There is no rebound and no guarding.  Central obesity  Lymphadenopathy:    She has no cervical adenopathy.  Neurological: She is alert and oriented to person, place, and time. No cranial nerve deficit. She exhibits normal muscle tone. Coordination normal.  Skin: Skin is warm and dry. No rash noted. She is not diaphoretic.  Psychiatric: She has a normal mood and affect. Her behavior is normal. Judgment and thought content normal.         Assessment & Plan:

## 2015-06-02 ENCOUNTER — Encounter: Payer: Self-pay | Admitting: Nurse Practitioner

## 2015-06-02 NOTE — Assessment & Plan Note (Signed)
[facility] form given to pt to have labs done: A1c, Renal Function panel, HFP, CRP, ESR, and vitamin D levels. Pt is non-compliant and frustrated with diet and exercise. Discussed basic diet rules and exercise goals, pt is agreeable to referral to Dr. Gabriel Carina for endocrine care. FU prn worsening/failure to improve.

## 2015-06-02 NOTE — Assessment & Plan Note (Signed)
Discussed basic diet and exercise recommendations. Pt is very non-compliant and is understanding of process, but not ready to lose weight.   Wt Readings from Last 3 Encounters:  05/20/15 310 lb 9.6 oz (140.887 kg)  03/25/15 308 lb 12.8 oz (140.071 kg)  03/22/15 311 lb (141.069 kg)

## 2015-07-01 ENCOUNTER — Other Ambulatory Visit: Payer: Self-pay | Admitting: Nurse Practitioner

## 2015-07-22 ENCOUNTER — Ambulatory Visit (INDEPENDENT_AMBULATORY_CARE_PROVIDER_SITE_OTHER): Payer: 59 | Admitting: Nurse Practitioner

## 2015-07-22 VITALS — BP 138/102 | HR 93 | Temp 98.3°F | Resp 16 | Ht 69.0 in | Wt 308.4 lb

## 2015-07-22 DIAGNOSIS — J069 Acute upper respiratory infection, unspecified: Secondary | ICD-10-CM | POA: Diagnosis not present

## 2015-07-22 MED ORDER — HYDROCOD POLST-CPM POLST ER 10-8 MG/5ML PO SUER: 5.0000 mL | Freq: Every evening | ORAL | Status: DC | PRN

## 2015-07-22 MED ORDER — CEPHALEXIN 500 MG PO CAPS: 500.0000 mg | ORAL_CAPSULE | Freq: Two times a day (BID) | ORAL | Status: DC

## 2015-07-22 NOTE — Progress Notes (Signed)
Pre visit review using our clinic review tool, if applicable. No additional management support is needed unless otherwise documented below in the visit note. 

## 2015-07-22 NOTE — Patient Instructions (Addendum)
Keflex 500 mg twice daily for 7 days. Probiotics always recommended.  Call if no improvement by Thursday.

## 2015-07-22 NOTE — Progress Notes (Signed)
Patient ID: [name], female    DOB: [birth year]  Age: 49 y.o. MRN: [id number]  CC: URI   HPI [name] presents for URI symptoms with green purulent drainage from eyes x 2 days.   1) Purulent drainage from both eyes matting them shut this morning. She reports sore throat, headache, nasal congestion Nyquil and cold calm- not helpful  Denies sick contacts Denies fever, chills, sweats   History [name] has a past medical history of Asthma; Hypertension; Hyperlipidemia; Migraine; Kidney stone; and Diabetes mellitus without complication.   She has past surgical history that includes Cesarean section and Tonsillectomy and adenoidectomy.   Her family history includes Alcohol abuse in her mother; Cancer in her maternal grandmother and paternal grandmother; Drug abuse in her sister; Hyperlipidemia in her father and mother; Hypertension in her father and mother; Parkinson's disease in her father.She reports that she has never smoked. She has never used smokeless tobacco. She reports that she does not drink alcohol or use illicit drugs.  Outpatient Prescriptions Prior to Visit  Medication Sig Dispense Refill  . Alpha-Lipoic Acid 300 MG CAPS Take 300 mg by mouth daily.    . Ascorbic Acid (VITAMIN C) 1000 MG tablet Take 1,000 mg by mouth 2 (two) times daily.    . Astaxanthin 4 MG CAPS Take 4 mg by mouth daily.    . Blood Glucose Monitoring Suppl (ONE TOUCH ULTRA SYSTEM KIT) W/DEVICE KIT 1 kit by Does not apply route once. 1 each 0  . Capsicum, Cayenne, (CAYENNE FRUIT PO) Take 450 mg by mouth 2 (two) times daily.    . cholecalciferol (VITAMIN D) 1000 UNITS tablet Take 1,000 Units by mouth 2 (two) times daily.    . CHROMIUM PO Take 250 mg by mouth 2 (two) times daily.    . Cinnamon 500 MG capsule Take 500 mg by mouth 2 (two) times daily.    . Coenzyme Q10-Red Yeast Rice (CO Q-10 PLUS RED YEAST RICE) 60-600 MG CAPS Take by mouth 2 (two) times daily.    . ergocalciferol (DRISDOL)  50000 UNITS capsule Take 1 capsule (50,000 Units total) by mouth once a week. Take once weekly for 12 weeks 4 capsule 2  . GARCINIA CAMBOGIA-CHROMIUM PO Take 1,000 mg by mouth daily.    Marland Kitchen glucose blood test strip Check sugar 4 times daily 400 each 1  . GREEN COFFEE BEAN PO Take 500 mg by mouth daily.    . hydrochlorothiazide (HYDRODIURIL) 12.5 MG tablet Take 1 tablet (12.5 mg total) by mouth daily. 90 tablet 3  . insulin NPH-regular Human (NOVOLIN 70/30) (70-30) 100 UNIT/ML injection Inject 15 Units into the skin 2 (two) times daily with a meal. 30 mL 2  . Insulin Pen Needle 32G X 6 MM MISC Inject 1 applicator into the skin at bedtime. 100 each 1  . Insulin Syringe-Needle U-100 (INSULIN SYRINGE .5CC/30GX5/16") 30G X 5/16" 0.5 ML MISC Insulin syringes to be used for humulin R dx E11.65 300 each 3  . LEVEMIR FLEXTOUCH 100 UNIT/ML Pen Inject 24 units in the  morning and 10 units in the evening 45 mL 1  . lisinopril (PRINIVIL,ZESTRIL) 20 MG tablet TAKE 1 TABLET (20 MG TOTAL) BY MOUTH DAILY. 90 tablet 2  . metFORMIN (GLUCOPHAGE) 500 MG tablet TAKE 1 TABLET BY MOUTH 2  TIMES DAILY WITH MEALS 180 tablet 1  . mupirocin ointment (BACTROBAN) 2 % Apply 1 application topically 3 (three) times daily. 22 g 0  . traMADol (ULTRAM) 50  MG tablet Take 50 mg by mouth every 8 (eight) hours as needed.  2   No facility-administered medications prior to visit.    ROS Review of Systems  Constitutional: Negative for fever, chills, diaphoresis and fatigue.  HENT: Positive for congestion, postnasal drip and sore throat. Negative for ear discharge and ear pain.   Eyes: Positive for discharge and itching. Negative for photophobia, pain, redness and visual disturbance.  Respiratory: Positive for cough. Negative for chest tightness, shortness of breath and wheezing.   Cardiovascular: Negative for chest pain, palpitations and leg swelling.  Gastrointestinal: Negative for nausea, vomiting and diarrhea.  Skin: Negative for  rash.  Neurological: Positive for headaches.    Objective:  BP 138/102 mmHg  Pulse 93  Temp(Src) 98.3 F (36.8 C)  Resp 16  Ht $R'5\' 9"'td$  (1.753 m)  Wt 308 lb 6.4 oz (139.889 kg)  BMI 45.52 kg/m2  SpO2 95%  Physical Exam  Constitutional: She is oriented to person, place, and time. She appears well-developed and well-nourished. No distress.  HENT:  Head: Normocephalic and atraumatic.  Right Ear: External ear normal.  Left Ear: External ear normal.  TM's have serous fluid with slight bulging, no visible landmarks  Eyes: EOM are normal. Pupils are equal, round, and reactive to light. Right eye exhibits discharge. Left eye exhibits discharge. No scleral icterus.  Green mucous inner canthus Conjunctivits present  Neck: Normal range of motion. Neck supple.  Cardiovascular: Normal rate, regular rhythm and normal heart sounds.   Pulmonary/Chest: Effort normal and breath sounds normal. No respiratory distress. She has no wheezes. She has no rales. She exhibits no tenderness.  Lymphadenopathy:    She has no cervical adenopathy.  Neurological: She is alert and oriented to person, place, and time. No cranial nerve deficit. She exhibits normal muscle tone. Coordination normal.  Skin: Skin is warm and dry. No rash noted. She is not diaphoretic.  Psychiatric: She has a normal mood and affect. Her behavior is normal. Judgment and thought content normal.   Assessment & Plan:   [name] was seen today for uri.  Diagnoses and all orders for this visit:  Acute URI  Other orders -     cephALEXin (KEFLEX) 500 MG capsule; Take 1 capsule (500 mg total) by mouth 2 (two) times daily. -     chlorpheniramine-HYDROcodone (TUSSIONEX PENNKINETIC ER) 10-8 MG/5ML SUER; Take 5 mLs by mouth at bedtime as needed for cough.  I am having [name] start on cephALEXin and chlorpheniramine-HYDROcodone. I am also having her maintain her Coenzyme Q10-Red Yeast Rice, LEVEMIR FLEXTOUCH, mupirocin ointment, Insulin Pen  Needle, ergocalciferol, hydrochlorothiazide, ONE TOUCH ULTRA SYSTEM KIT, glucose blood, lisinopril, insulin NPH-regular Human, INSULIN SYRINGE .5CC/30GX5/16", cholecalciferol, traMADol, Alpha-Lipoic Acid, Cinnamon, CHROMIUM PO, GARCINIA CAMBOGIA-CHROMIUM PO, (Capsicum, Cayenne, (CAYENNE FRUIT PO)), Astaxanthin, GREEN COFFEE BEAN PO, vitamin C, and metFORMIN.  Meds ordered this encounter  Medications  . cephALEXin (KEFLEX) 500 MG capsule    Sig: Take 1 capsule (500 mg total) by mouth 2 (two) times daily.    Dispense:  14 capsule    Refill:  0    Order Specific Question:  Supervising Provider    Answer:  Deborra Medina L [[id number]]  . chlorpheniramine-HYDROcodone (TUSSIONEX PENNKINETIC ER) 10-8 MG/5ML SUER    Sig: Take 5 mLs by mouth at bedtime as needed for cough.    Dispense:  115 mL    Refill:  0    Order Specific Question:  Supervising Provider    Answer:  Derrel Nip,  TERESA L [[id number]]     Follow-up: Return if symptoms worsen or fail to improve.

## 2015-07-28 ENCOUNTER — Encounter: Payer: Self-pay | Admitting: Nurse Practitioner

## 2015-07-28 DIAGNOSIS — J069 Acute upper respiratory infection, unspecified: Secondary | ICD-10-CM | POA: Insufficient documentation

## 2015-07-28 NOTE — Assessment & Plan Note (Signed)
URI w/ conjunctivitis. Pt was given Keflex 500 mg bid x 7 days. Encouraged probiotics. Will follow if worsening/failure to improve.

## 2015-07-29 ENCOUNTER — Other Ambulatory Visit: Payer: Self-pay | Admitting: Nurse Practitioner

## 2015-08-01 ENCOUNTER — Telehealth: Payer: Self-pay | Admitting: *Deleted

## 2015-08-01 NOTE — Telephone Encounter (Signed)
Patient dropped off forms to be filled out. (appeal Forms) Forms are in C. Doss box-thanks

## 2015-08-02 DIAGNOSIS — Z0279 Encounter for issue of other medical certificate: Secondary | ICD-10-CM

## 2015-08-02 NOTE — Telephone Encounter (Signed)
Called pt and let her know that her paperwork was ready for her to pick up and would be at the front desk

## 2015-08-02 NOTE — Telephone Encounter (Signed)
Please let pt know that her form is ready.

## 2015-08-06 ENCOUNTER — Other Ambulatory Visit: Payer: Self-pay

## 2015-08-06 ENCOUNTER — Telehealth: Payer: Self-pay

## 2015-08-06 NOTE — Telephone Encounter (Signed)
[facility] called to state pt wanted to change pen needles to bid instead if one at night. Lorane Gell gave the okay for that.

## 2015-08-06 NOTE — Telephone Encounter (Signed)
Rx was faxed to [facility], she was referred to Dr Gabriel Carina.

## 2015-08-12 ENCOUNTER — Encounter: Payer: Self-pay | Admitting: Nurse Practitioner

## 2015-08-12 ENCOUNTER — Ambulatory Visit (INDEPENDENT_AMBULATORY_CARE_PROVIDER_SITE_OTHER): Payer: 59 | Admitting: Nurse Practitioner

## 2015-08-12 VITALS — BP 116/84 | HR 92 | Temp 98.3°F | Resp 16 | Ht 69.0 in | Wt 311.0 lb

## 2015-08-12 DIAGNOSIS — Z794 Long term (current) use of insulin: Secondary | ICD-10-CM | POA: Diagnosis not present

## 2015-08-12 DIAGNOSIS — E1165 Type 2 diabetes mellitus with hyperglycemia: Secondary | ICD-10-CM | POA: Diagnosis not present

## 2015-08-12 MED ORDER — INSULIN DETEMIR 100 UNIT/ML ~~LOC~~ SOLN: 20.0000 [IU] | Freq: Two times a day (BID) | SUBCUTANEOUS | Status: DC

## 2015-08-12 MED ORDER — "INSULIN SYRINGE 30G X 5/16"" 0.5 ML MISC": Status: DC

## 2015-08-12 NOTE — Progress Notes (Signed)
Pre visit review using our clinic review tool, if applicable. No additional management support is needed unless otherwise documented below in the visit note. 

## 2015-08-12 NOTE — Progress Notes (Signed)
Patient ID: [name], female    DOB: 09/17/1966  Age: 49 y.o. MRN: [id number]  CC: Follow-up   HPI Sande Pickert presents for follow up of Diabetes, Vision blurred, and toe concern.   1) Pt is following up after seeing Dr. Gabriel Carina at [place].   Prior to switching to Novolog, Novolin was causing "crashing" after eating. Does not feel tired after meals anymore  170's- 360 recently. 201 this morning. Checking 5 x daily Following up in Dec. With Dr. Gabriel Carina  A1c down to 9.9 according to a tracker APP called Glucose log book  Levemir- Wants to switch from Pen to vial. Abdomen has small red, tough areas that itch occasionally after use.   2) Blurry vision of left eye. Trying to get an appointment with Dr. Herbert Deaner- no change in floaters, denies flashes of light, feels a film over eye.   3) Toe- concerned about right foot 3rd toe bruising to nail. 3-4 weeks, no changes, denies discharge or bleeding. Denies pain  History Savilla has a past medical history of Asthma; Hypertension; Hyperlipidemia; Migraine; Kidney stone; and Diabetes mellitus without complication.   She has past surgical history that includes Cesarean section and Tonsillectomy and adenoidectomy.   Her family history includes Alcohol abuse in her mother; Cancer in her maternal grandmother and paternal grandmother; Drug abuse in her sister; Hyperlipidemia in her father and mother; Hypertension in her father and mother; Parkinson's disease in her father.She reports that she has never smoked. She has never used smokeless tobacco. She reports that she does not drink alcohol or use illicit drugs.  Outpatient Prescriptions Prior to Visit  Medication Sig Dispense Refill  . Alpha-Lipoic Acid 300 MG CAPS Take 300 mg by mouth daily.    . Ascorbic Acid (VITAMIN C) 1000 MG tablet Take 1,000 mg by mouth 2 (two) times daily.    . Astaxanthin 4 MG CAPS Take 4 mg by mouth daily.    . Capsicum, Cayenne, (CAYENNE FRUIT PO) Take 450  mg by mouth 2 (two) times daily.    . cholecalciferol (VITAMIN D) 1000 UNITS tablet Take 1,000 Units by mouth 2 (two) times daily.    . CHROMIUM PO Take 250 mg by mouth 2 (two) times daily.    . Cinnamon 500 MG capsule Take 500 mg by mouth 2 (two) times daily.    . Coenzyme Q10-Red Yeast Rice (CO Q-10 PLUS RED YEAST RICE) 60-600 MG CAPS Take by mouth 2 (two) times daily.    Marland Kitchen EASY TOUCH PEN NEEDLES 32G X 6 MM MISC Inject 1 applicator into  the skin at bedtime. 400 each 3  . GARCINIA CAMBOGIA-CHROMIUM PO Take 1,000 mg by mouth daily.    Marland Kitchen GREEN COFFEE BEAN PO Take 500 mg by mouth daily.    Marland Kitchen lisinopril (PRINIVIL,ZESTRIL) 20 MG tablet Take 1 tablet by mouth  daily 90 tablet 4  . metFORMIN (GLUCOPHAGE) 500 MG tablet TAKE 1 TABLET BY MOUTH 2  TIMES DAILY WITH MEALS 180 tablet 1  . ONE TOUCH ULTRA TEST test strip Check sugar 4 times daily 400 each 3  . Blood Glucose Monitoring Suppl (ONE TOUCH ULTRA SYSTEM KIT) W/DEVICE KIT 1 kit by Does not apply route once. 1 each 0  . Insulin Syringe-Needle U-100 (INSULIN SYRINGE .5CC/30GX5/16") 30G X 5/16" 0.5 ML MISC Insulin syringes to be used for humulin R dx E11.65 300 each 3  . LEVEMIR FLEXTOUCH 100 UNIT/ML Pen Inject 24 units in the  morning and 10 units  in the evening 45 mL 1  . mupirocin ointment (BACTROBAN) 2 % Apply 1 application topically 3 (three) times daily. 22 g 0  . cephALEXin (KEFLEX) 500 MG capsule Take 1 capsule (500 mg total) by mouth 2 (two) times daily. (Patient not taking: Reported on 08/12/2015) 14 capsule 0  . chlorpheniramine-HYDROcodone (TUSSIONEX PENNKINETIC ER) 10-8 MG/5ML SUER Take 5 mLs by mouth at bedtime as needed for cough. (Patient not taking: Reported on 08/12/2015) 115 mL 0  . ergocalciferol (DRISDOL) 50000 UNITS capsule Take 1 capsule (50,000 Units total) by mouth once a week. Take once weekly for 12 weeks 4 capsule 2  . hydrochlorothiazide (HYDRODIURIL) 12.5 MG tablet Take 1 tablet (12.5 mg total) by mouth daily. (Patient not  taking: Reported on 08/12/2015) 90 tablet 3  . insulin NPH-regular Human (NOVOLIN 70/30) (70-30) 100 UNIT/ML injection Inject 15 Units into the skin 2 (two) times daily with a meal. (Patient not taking: Reported on 08/12/2015) 30 mL 2  . traMADol (ULTRAM) 50 MG tablet Take 50 mg by mouth every 8 (eight) hours as needed.  2   No facility-administered medications prior to visit.    ROS Review of Systems  Constitutional: Negative for fever, chills, diaphoresis and fatigue.  Gastrointestinal: Negative for nausea, vomiting, diarrhea and constipation.  Endocrine: Negative for polydipsia, polyphagia and polyuria.  Skin: Positive for color change. Negative for rash.       Right foot 3rd toenail  Neurological: Negative for dizziness, weakness, numbness and headaches.  Psychiatric/Behavioral: The patient is not nervous/anxious.     Objective:  BP 116/84 mmHg  Pulse 92  Temp(Src) 98.3 F (36.8 C)  Resp 16  Ht _0  (1.753 m)  Wt 311 lb (141.069 kg)  BMI 45.91 kg/m2  SpO2 94%  Physical Exam  Constitutional: She is oriented to person, place, and time. She appears well-developed and well-nourished. No distress.  HENT:  Head: Normocephalic and atraumatic.  Right Ear: External ear normal.  Left Ear: External ear normal.  Eyes: Right eye exhibits no discharge. Left eye exhibits no discharge. No scleral icterus.  Cardiovascular: Normal rate, regular rhythm and normal heart sounds.  Exam reveals no gallop and no friction rub.   No murmur heard. Pulmonary/Chest: Effort normal and breath sounds normal. No respiratory distress. She has no wheezes. She has no rales. She exhibits no tenderness.  Neurological: She is alert and oriented to person, place, and time. No cranial nerve deficit. She exhibits normal muscle tone. Coordination normal.  Skin: Skin is warm and dry. No rash noted. She is not diaphoretic.  Skin and nails of bilateral feet look healthy, good sensation, right foot third toenail looks  slightly pinker than surrounding ones and only at the top, skin is non-tender and not bruised, no signs of infection or injury  Psychiatric: She has a normal mood and affect. Her behavior is normal. Judgment and thought content normal.   Assessment & Plan:   Brya was seen today for follow-up.  Diagnoses and all orders for this visit:  Uncontrolled type 2 diabetes mellitus with hyperglycemia, with long-term current use of insulin ([place])  Other orders -     insulin detemir (LEVEMIR) 100 UNIT/ML injection; Inject 0.2 mLs (20 Units total) into the skin 2 (two) times daily. -     Insulin Syringe-Needle U-100 (INSULIN SYRINGE .5CC/30GX5/16") 30G X 5/16" 0.5 ML MISC; Insulin syringes to be used for humulin R dx E11.65  I have discontinued Ms. Mauger's LEVEMIR FLEXTOUCH, mupirocin ointment, ergocalciferol, hydrochlorothiazide, ONE  TOUCH ULTRA SYSTEM KIT, insulin NPH-regular Human, traMADol, cephALEXin, and chlorpheniramine-HYDROcodone. I am also having her start on insulin detemir. Additionally, I am having her maintain her Coenzyme Q10-Red Yeast Rice, cholecalciferol, Alpha-Lipoic Acid, Cinnamon, CHROMIUM PO, GARCINIA CAMBOGIA-CHROMIUM PO, (Capsicum, Cayenne, (CAYENNE FRUIT PO)), Astaxanthin, GREEN COFFEE BEAN PO, vitamin C, metFORMIN, ONE TOUCH ULTRA TEST, lisinopril, EASY TOUCH PEN NEEDLES, insulin aspart, and INSULIN SYRINGE .5CC/30GX5/16".  Meds ordered this encounter  Medications  . insulin aspart (NOVOLOG) 100 UNIT/ML injection    Sig: Inject 20 Units into the skin 3 (three) times daily before meals.  . insulin detemir (LEVEMIR) 100 UNIT/ML injection    Sig: Inject 0.2 mLs (20 Units total) into the skin 2 (two) times daily.    Dispense:  10 mL    Refill:  2    Order Specific Question:  Supervising Provider    Answer:  Derrel Nip, TERESA L [[id number]]  . Insulin Syringe-Needle U-100 (INSULIN SYRINGE .5CC/30GX5/16") 30G X 5/16" 0.5 ML MISC    Sig: Insulin syringes to be used for humulin R dx E11.65     Dispense:  300 each    Refill:  3    Order Specific Question:  Supervising Provider    Answer:  Crecencio Mc [[id number]]     Follow-up: Return if symptoms worsen or fail to improve.

## 2015-08-12 NOTE — Assessment & Plan Note (Signed)
Patient seeing Dr. Gabriel Carina at [place]. Switched from Novolin 70/30 to [employer]. Pt reports improved control and feels better, too. Today I switched pt from Levemir flex pen to vial. Sent in syringes and needles to [facility]. No concerns about the toe on the right foot. Will follow if any changes. Pt seeing Dr. Gabriel Carina again in Dec.   Blurry vision- pt reports she is getting in with ophthalmology soon. Will follow if any changes. Do not believe it is retinal detachment at this time.

## 2015-08-13 LAB — HM DIABETES EYE EXAM

## 2015-08-15 ENCOUNTER — Other Ambulatory Visit: Payer: Self-pay

## 2015-08-15 MED ORDER — INSULIN DETEMIR 100 UNIT/ML ~~LOC~~ SOLN: 20.0000 [IU] | Freq: Two times a day (BID) | SUBCUTANEOUS | Status: DC

## 2015-11-01 ENCOUNTER — Other Ambulatory Visit: Payer: Self-pay | Admitting: Nurse Practitioner

## 2015-11-10 HISTORY — PX: BREAST BIOPSY: SHX20

## 2015-11-10 HISTORY — PX: BARIATRIC SURGERY: SHX1103

## 2015-11-14 ENCOUNTER — Other Ambulatory Visit: Payer: Self-pay | Admitting: Nurse Practitioner

## 2015-11-14 MED ORDER — GLUCOSE BLOOD VI STRP: ORAL_STRIP | Status: DC

## 2015-11-25 ENCOUNTER — Encounter: Payer: Self-pay | Admitting: Nurse Practitioner

## 2015-12-03 ENCOUNTER — Encounter: Payer: Self-pay | Admitting: Nurse Practitioner

## 2015-12-30 LAB — BASIC METABOLIC PANEL
BUN: 12 mg/dL (ref 4–21)
Creatinine: 0.7 mg/dL (ref 0.5–1.1)
Glucose: 175 mg/dL
Potassium: 4.4 mmol/L (ref 3.4–5.3)
Sodium: 140 mmol/L (ref 137–147)

## 2015-12-30 LAB — HEPATIC FUNCTION PANEL
ALT: 81 U/L — AB (ref 7–35)
AST: 39 U/L — AB (ref 13–35)
Alkaline Phosphatase: 55 U/L (ref 25–125)
Bilirubin, Total: 0.3 mg/dL

## 2015-12-30 LAB — CBC AND DIFFERENTIAL
HCT: 41 % (ref 36–46)
Hemoglobin: 13.9 g/dL (ref 12.0–16.0)
Neutrophils Absolute: 2 /uL
Platelets: 199 10*3/uL (ref 150–399)
WBC: 4.9 10^3/mL

## 2015-12-30 LAB — TSH: TSH: 2.83 u[IU]/mL (ref 0.41–5.90)

## 2015-12-30 LAB — POCT INR: INR: 1 (ref 0.9–1.1)

## 2015-12-30 LAB — HEMOGLOBIN A1C: Hemoglobin A1C: 10.2

## 2015-12-30 LAB — PROTIME-INR: Protime: 10.1 seconds (ref 10.0–13.8)

## 2016-01-28 ENCOUNTER — Encounter: Payer: Self-pay | Admitting: Nurse Practitioner

## 2016-01-28 NOTE — Telephone Encounter (Signed)
Are you okay with this?

## 2016-01-29 ENCOUNTER — Other Ambulatory Visit: Payer: Self-pay | Admitting: Nurse Practitioner

## 2016-01-29 DIAGNOSIS — Z1239 Encounter for other screening for malignant neoplasm of breast: Secondary | ICD-10-CM

## 2016-02-04 ENCOUNTER — Other Ambulatory Visit: Payer: Self-pay | Admitting: Nurse Practitioner

## 2016-02-05 LAB — CBC WITH DIFFERENTIAL/PLATELET
Basophils Absolute: 0 10*3/uL (ref 0.0–0.2)
Basos: 0 %
EOS (ABSOLUTE): 0.2 10*3/uL (ref 0.0–0.4)
Eos: 3 %
Hematocrit: 39.6 % (ref 34.0–46.6)
Hemoglobin: 13.4 g/dL (ref 11.1–15.9)
Immature Grans (Abs): 0 10*3/uL (ref 0.0–0.1)
Immature Granulocytes: 0 %
Lymphocytes Absolute: 2.4 10*3/uL (ref 0.7–3.1)
Lymphs: 41 %
MCH: 29.8 pg (ref 26.6–33.0)
MCHC: 33.8 g/dL (ref 31.5–35.7)
MCV: 88 fL (ref 79–97)
Monocytes Absolute: 0.5 10*3/uL (ref 0.1–0.9)
Monocytes: 8 %
Neutrophils Absolute: 2.7 10*3/uL (ref 1.4–7.0)
Neutrophils: 48 %
Platelets: 205 10*3/uL (ref 150–379)
RBC: 4.5 x10E6/uL (ref 3.77–5.28)
RDW: 14.4 % (ref 12.3–15.4)
WBC: 5.7 10*3/uL (ref 3.4–10.8)

## 2016-02-05 LAB — COMPREHENSIVE METABOLIC PANEL
ALT: 81 IU/L — ABNORMAL HIGH (ref 0–32)
AST: 51 IU/L — ABNORMAL HIGH (ref 0–40)
Albumin/Globulin Ratio: 1.4 (ref 1.2–2.2)
Albumin: 4 g/dL (ref 3.5–5.5)
Alkaline Phosphatase: 52 IU/L (ref 39–117)
BUN/Creatinine Ratio: 17 (ref 9–23)
BUN: 11 mg/dL (ref 6–24)
Bilirubin Total: 0.3 mg/dL (ref 0.0–1.2)
CO2: 24 mmol/L (ref 18–29)
Calcium: 9.1 mg/dL (ref 8.7–10.2)
Chloride: 99 mmol/L (ref 96–106)
Creatinine, Ser: 0.65 mg/dL (ref 0.57–1.00)
GFR calc Af Amer: 121 mL/min/{1.73_m2} (ref >59)
GFR calc non Af Amer: 105 mL/min/{1.73_m2} (ref >59)
Globulin, Total: 2.8 g/dL (ref 1.5–4.5)
Glucose: 227 mg/dL — ABNORMAL HIGH (ref 65–99)
Potassium: 4.2 mmol/L (ref 3.5–5.2)
Sodium: 138 mmol/L (ref 134–144)
Total Protein: 6.8 g/dL (ref 6.0–8.5)

## 2016-02-05 LAB — HEPATIC FUNCTION PANEL: Bilirubin, Direct: 0.11 mg/dL (ref 0.00–0.40)

## 2016-02-05 LAB — LIPID PANEL W/O CHOL/HDL RATIO
Cholesterol, Total: 237 mg/dL — ABNORMAL HIGH (ref 100–199)
HDL: 34 mg/dL — ABNORMAL LOW (ref >39)
LDL Calculated: 163 mg/dL — ABNORMAL HIGH (ref 0–99)
Triglycerides: 200 mg/dL — ABNORMAL HIGH (ref 0–149)
VLDL Cholesterol Cal: 40 mg/dL (ref 5–40)

## 2016-02-05 LAB — FOLATE: Folate: 11.6 ng/mL (ref >3.0)

## 2016-02-05 LAB — RENAL FUNCTION PANEL: Phosphorus: 3.9 mg/dL (ref 2.5–4.5)

## 2016-02-05 LAB — HGB A1C W/O EAG: Hgb A1c MFr Bld: 8.8 % — ABNORMAL HIGH (ref 4.8–5.6)

## 2016-02-05 LAB — VITAMIN D 25 HYDROXY (VIT D DEFICIENCY, FRACTURES): Vit D, 25-Hydroxy: 18.7 ng/mL — ABNORMAL LOW (ref 30.0–100.0)

## 2016-02-07 ENCOUNTER — Other Ambulatory Visit: Payer: Self-pay | Admitting: Nurse Practitioner

## 2016-02-07 DIAGNOSIS — Z1239 Encounter for other screening for malignant neoplasm of breast: Secondary | ICD-10-CM

## 2016-02-14 ENCOUNTER — Ambulatory Visit: Payer: 59 | Admitting: Nurse Practitioner

## 2016-02-26 ENCOUNTER — Other Ambulatory Visit: Payer: Self-pay | Admitting: Nurse Practitioner

## 2016-02-26 ENCOUNTER — Ambulatory Visit (INDEPENDENT_AMBULATORY_CARE_PROVIDER_SITE_OTHER): Payer: 59 | Admitting: Nurse Practitioner

## 2016-02-26 ENCOUNTER — Encounter: Payer: Self-pay | Admitting: Nurse Practitioner

## 2016-02-26 VITALS — BP 142/88 | HR 96 | Temp 98.1°F | Ht 68.35 in | Wt 317.6 lb

## 2016-02-26 DIAGNOSIS — M25561 Pain in right knee: Secondary | ICD-10-CM | POA: Diagnosis not present

## 2016-02-26 DIAGNOSIS — Z23 Encounter for immunization: Secondary | ICD-10-CM | POA: Diagnosis not present

## 2016-02-26 DIAGNOSIS — M25562 Pain in left knee: Secondary | ICD-10-CM

## 2016-02-26 DIAGNOSIS — Z0000 Encounter for general adult medical examination without abnormal findings: Secondary | ICD-10-CM

## 2016-02-26 DIAGNOSIS — Z01419 Encounter for gynecological examination (general) (routine) without abnormal findings: Secondary | ICD-10-CM | POA: Diagnosis not present

## 2016-02-26 MED ORDER — EPINEPHRINE 0.3 MG/0.3ML IJ SOAJ: 0.3000 mg | Freq: Once | INTRAMUSCULAR | Status: DC

## 2016-02-26 NOTE — Patient Instructions (Signed)
Health Maintenance, Female Adopting a healthy lifestyle and getting preventive care can go a long way to promote health and wellness. Talk with your health care provider about what schedule of regular examinations is right for you. This is a good chance for you to check in with your provider about disease prevention and staying healthy. In between checkups, there are plenty of things you can do on your own. Experts have done a lot of research about which lifestyle changes and preventive measures are most likely to keep you healthy. Ask your health care provider for more information. WEIGHT AND DIET  Eat a healthy diet  Be sure to include plenty of vegetables, fruits, low-fat dairy products, and lean protein.  Do not eat a lot of foods high in solid fats, added sugars, or salt.  Get regular exercise. This is one of the most important things you can do for your health.  Most adults should exercise for at least 150 minutes each week. The exercise should increase your heart rate and make you sweat (moderate-intensity exercise).  Most adults should also do strengthening exercises at least twice a week. This is in addition to the moderate-intensity exercise.  Maintain a healthy weight  Body mass index (BMI) is a measurement that can be used to identify possible weight problems. It estimates body fat based on height and weight. Your health care provider can help determine your BMI and help you achieve or maintain a healthy weight.  For females 20 years of age and older:   A BMI below 18.5 is considered underweight.  A BMI of 18.5 to 24.9 is normal.  A BMI of 25 to 29.9 is considered overweight.  A BMI of 30 and above is considered obese.  Watch levels of cholesterol and blood lipids  You should start having your blood tested for lipids and cholesterol at 50 years of age, then have this test every 5 years.  You may need to have your cholesterol levels checked more often if:  Your lipid  or cholesterol levels are high.  You are older than 50 years of age.  You are at high risk for heart disease.  CANCER SCREENING   Lung Cancer  Lung cancer screening is recommended for adults 55-80 years old who are at high risk for lung cancer because of a history of smoking.  A yearly low-dose CT scan of the lungs is recommended for people who:  Currently smoke.  Have quit within the past 15 years.  Have at least a 30-pack-year history of smoking. A pack year is smoking an average of one pack of cigarettes a day for 1 year.  Yearly screening should continue until it has been 15 years since you quit.  Yearly screening should stop if you develop a health problem that would prevent you from having lung cancer treatment.  Breast Cancer  Practice breast self-awareness. This means understanding how your breasts normally appear and feel.  It also means doing regular breast self-exams. Let your health care provider know about any changes, no matter how small.  If you are in your 20s or 30s, you should have a clinical breast exam (CBE) by a health care provider every 1-3 years as part of a regular health exam.  If you are 40 or older, have a CBE every year. Also consider having a breast X-ray (mammogram) every year.  If you have a family history of breast cancer, talk to your health care provider about genetic screening.  If you   are at high risk for breast cancer, talk to your health care provider about having an MRI and a mammogram every year.  Breast cancer gene (BRCA) assessment is recommended for women who have family members with BRCA-related cancers. BRCA-related cancers include:  Breast.  Ovarian.  Tubal.  Peritoneal cancers.  Results of the assessment will determine the need for genetic counseling and BRCA1 and BRCA2 testing. Cervical Cancer Your health care provider may recommend that you be screened regularly for cancer of the pelvic organs (ovaries, uterus, and  vagina). This screening involves a pelvic examination, including checking for microscopic changes to the surface of your cervix (Pap test). You may be encouraged to have this screening done every 3 years, beginning at age 21.  For women ages 30-65, health care providers may recommend pelvic exams and Pap testing every 3 years, or they may recommend the Pap and pelvic exam, combined with testing for human papilloma virus (HPV), every 5 years. Some types of HPV increase your risk of cervical cancer. Testing for HPV may also be done on women of any age with unclear Pap test results.  Other health care providers may not recommend any screening for nonpregnant women who are considered low risk for pelvic cancer and who do not have symptoms. Ask your health care provider if a screening pelvic exam is right for you.  If you have had past treatment for cervical cancer or a condition that could lead to cancer, you need Pap tests and screening for cancer for at least 20 years after your treatment. If Pap tests have been discontinued, your risk factors (such as having a new sexual partner) need to be reassessed to determine if screening should resume. Some women have medical problems that increase the chance of getting cervical cancer. In these cases, your health care provider may recommend more frequent screening and Pap tests. Colorectal Cancer  This type of cancer can be detected and often prevented.  Routine colorectal cancer screening usually begins at 50 years of age and continues through 50 years of age.  Your health care provider may recommend screening at an earlier age if you have risk factors for colon cancer.  Your health care provider may also recommend using home test kits to check for hidden blood in the stool.  A small camera at the end of a tube can be used to examine your colon directly (sigmoidoscopy or colonoscopy). This is done to check for the earliest forms of colorectal  cancer.  Routine screening usually begins at age 50.  Direct examination of the colon should be repeated every 5-10 years through 50 years of age. However, you may need to be screened more often if early forms of precancerous polyps or small growths are found. Skin Cancer  Check your skin from head to toe regularly.  Tell your health care provider about any new moles or changes in moles, especially if there is a change in a mole's shape or color.  Also tell your health care provider if you have a mole that is larger than the size of a pencil eraser.  Always use sunscreen. Apply sunscreen liberally and repeatedly throughout the day.  Protect yourself by wearing long sleeves, pants, a wide-brimmed hat, and sunglasses whenever you are outside. HEART DISEASE, DIABETES, AND HIGH BLOOD PRESSURE   High blood pressure causes heart disease and increases the risk of stroke. High blood pressure is more likely to develop in:  People who have blood pressure in the high end   of the normal range (130-139/85-89 mm Hg).  People who are overweight or obese.  People who are African American.  If you are 38-23 years of age, have your blood pressure checked every 3-5 years. If you are 61 years of age or older, have your blood pressure checked every year. You should have your blood pressure measured twice--once when you are at a hospital or clinic, and once when you are not at a hospital or clinic. Record the average of the two measurements. To check your blood pressure when you are not at a hospital or clinic, you can use:  An automated blood pressure machine at a pharmacy.  A home blood pressure monitor.  If you are between 45 years and 39 years old, ask your health care provider if you should take aspirin to prevent strokes.  Have regular diabetes screenings. This involves taking a blood sample to check your fasting blood sugar level.  If you are at a normal weight and have a low risk for diabetes,  have this test once every three years after 50 years of age.  If you are overweight and have a high risk for diabetes, consider being tested at a younger age or more often. PREVENTING INFECTION  Hepatitis B  If you have a higher risk for hepatitis B, you should be screened for this virus. You are considered at high risk for hepatitis B if:  You were born in a country where hepatitis B is common. Ask your health care provider which countries are considered high risk.  Your parents were born in a high-risk country, and you have not been immunized against hepatitis B (hepatitis B vaccine).  You have HIV or AIDS.  You use needles to inject street drugs.  You live with someone who has hepatitis B.  You have had sex with someone who has hepatitis B.  You get hemodialysis treatment.  You take certain medicines for conditions, including cancer, organ transplantation, and autoimmune conditions. Hepatitis C  Blood testing is recommended for:  Everyone born from [birth year] through 1965.  Anyone with known risk factors for hepatitis C. Sexually transmitted infections (STIs)  You should be screened for sexually transmitted infections (STIs) including gonorrhea and chlamydia if:  You are sexually active and are younger than 50 years of age.  You are older than 50 years of age and your health care provider tells you that you are at risk for this type of infection.  Your sexual activity has changed since you were last screened and you are at an increased risk for chlamydia or gonorrhea. Ask your health care provider if you are at risk.  If you do not have HIV, but are at risk, it may be recommended that you take a prescription medicine daily to prevent HIV infection. This is called pre-exposure prophylaxis (PrEP). You are considered at risk if:  You are sexually active and do not regularly use condoms or know the HIV status of your partner(s).  You take drugs by injection.  You are sexually  active with a partner who has HIV. Talk with your health care provider about whether you are at high risk of being infected with HIV. If you choose to begin PrEP, you should first be tested for HIV. You should then be tested every 3 months for as long as you are taking PrEP.  PREGNANCY   If you are premenopausal and you may become pregnant, ask your health care provider about preconception counseling.  If you may  become pregnant, take 400 to 800 micrograms (mcg) of folic acid every day.  If you want to prevent pregnancy, talk to your health care provider about birth control (contraception). OSTEOPOROSIS AND MENOPAUSE   Osteoporosis is a disease in which the bones lose minerals and strength with aging. This can result in serious bone fractures. Your risk for osteoporosis can be identified using a bone density scan.  If you are 61 years of age or older, or if you are at risk for osteoporosis and fractures, ask your health care provider if you should be screened.  Ask your health care provider whether you should take a calcium or vitamin D supplement to lower your risk for osteoporosis.  Menopause may have certain physical symptoms and risks.  Hormone replacement therapy may reduce some of these symptoms and risks. Talk to your health care provider about whether hormone replacement therapy is right for you.  HOME CARE INSTRUCTIONS   Schedule regular health, dental, and eye exams.  Stay current with your immunizations.   Do not use any tobacco products including cigarettes, chewing tobacco, or electronic cigarettes.  If you are pregnant, do not drink alcohol.  If you are breastfeeding, limit how much and how often you drink alcohol.  Limit alcohol intake to no more than 1 drink per day for nonpregnant women. One drink equals 12 ounces of beer, 5 ounces of wine, or 1 ounces of hard liquor.  Do not use street drugs.  Do not share needles.  Ask your health care provider for help if  you need support or information about quitting drugs.  Tell your health care provider if you often feel depressed.  Tell your health care provider if you have ever been abused or do not feel safe at home.   This information is not intended to replace advice given to you by your health care provider. Make sure you discuss any questions you have with your health care provider.   Document Released: 05/11/2011 Document Revised: 11/16/2014 Document Reviewed: 09/27/2013 Elsevier Interactive Patient Education [employer].

## 2016-02-26 NOTE — Progress Notes (Signed)
Patient ID: [name], female    DOB: May 28, 1966  Age: 50 y.o. MRN: [id number]  CC: Annual Exam   HPI [name] presents for Annual Exam w/ PAP.  1) Annual Physical   Diet- Veggies increased, proteins, portions small   Exercise- 20 minutes daily 5 days a week   Immunizations- Tdap- Today   Mammogram- order has been placed  Eye Exam- UTD  Dental Exam- UTD  Labs- Done at [place].   Depression- Neg.  Refills: Denies need  Foot exam performed today  History [name] has a past medical history of Asthma; Hypertension; Hyperlipidemia; Migraine; Kidney stone; and Diabetes mellitus without complication ([place]).   She has past surgical history that includes Cesarean section and Tonsillectomy and adenoidectomy.   Her family history includes Alcohol abuse in her mother; Breast cancer in her maternal grandmother; Cancer in her maternal grandmother and paternal grandmother; Drug abuse in her sister; Hyperlipidemia in her father and mother; Hypertension in her father and mother; Parkinson's disease in her father.She reports that she has never smoked. She has never used smokeless tobacco. She reports that she does not drink alcohol or use illicit drugs.  Outpatient Prescriptions Prior to Visit  Medication Sig Dispense Refill  . glucose blood (ONE TOUCH ULTRA TEST) test strip Check sugar 4 times daily 400 each 3  . insulin aspart (NOVOLOG) 100 UNIT/ML injection Inject 20 Units into the skin 3 (three) times daily before meals. 22 units per meal and 10 units per snack.    Marland Kitchen lisinopril (PRINIVIL,ZESTRIL) 20 MG tablet Take 1 tablet by mouth  daily 90 tablet 4  . metFORMIN (GLUCOPHAGE) 500 MG tablet Take 1 tablet by mouth two  times daily with meals 180 tablet 1  . Alpha-Lipoic Acid 300 MG CAPS Take 300 mg by mouth daily.    . Ascorbic Acid (VITAMIN C) 1000 MG tablet Take 1,000 mg by mouth 2 (two) times daily.    . Astaxanthin 4 MG CAPS Take 4 mg by mouth daily.    .  Capsicum, Cayenne, (CAYENNE FRUIT PO) Take 450 mg by mouth 2 (two) times daily.    . cholecalciferol (VITAMIN D) 1000 UNITS tablet Take 5,000 Units by mouth daily.     . CHROMIUM PO Take 250 mg by mouth 2 (two) times daily.    . Cinnamon 500 MG capsule Take 500 mg by mouth 2 (two) times daily.    . Coenzyme Q10-Red Yeast Rice (CO Q-10 PLUS RED YEAST RICE) 60-600 MG CAPS Take by mouth 2 (two) times daily.    Marland Kitchen EASY TOUCH PEN NEEDLES 32G X 6 MM MISC Inject 1 applicator into  the skin at bedtime. 400 each 3  . GARCINIA CAMBOGIA-CHROMIUM PO Take 1,000 mg by mouth daily.    Marland Kitchen GREEN COFFEE BEAN PO Take 500 mg by mouth daily.    . insulin detemir (LEVEMIR) 100 UNIT/ML injection Inject 0.2 mLs (20 Units total) into the skin 2 (two) times daily. 10 mL 2  . 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    ROS Review of Systems  Constitutional: Negative for fever, chills, diaphoresis, fatigue and unexpected weight change.  HENT: Negative for tinnitus and trouble swallowing.   Eyes: Negative for visual disturbance.  Respiratory: Negative for chest tightness, shortness of breath and wheezing.   Cardiovascular: Negative  for chest pain, palpitations and leg swelling.  Gastrointestinal: Negative for nausea, vomiting, abdominal pain, diarrhea, constipation and blood in stool.  Endocrine: Negative for polydipsia, polyphagia and polyuria.  Genitourinary: Negative for dysuria, hematuria, vaginal discharge and vaginal pain.  Musculoskeletal: Positive for arthralgias. Negative for myalgias, back pain and gait problem.       Small joints like hands and feet  Skin: Negative for color change and rash.  Neurological: Negative for dizziness, weakness, numbness and headaches.  Hematological: Does not bruise/bleed easily.  Psychiatric/Behavioral: Negative for  suicidal ideas and sleep disturbance. The patient is not nervous/anxious.     Objective:  BP 142/88 mmHg  Pulse 96  Temp(Src) 98.1 F (36.7 C)  Ht 5' 8.35" (1.736 m)  Wt 317 lb 9.6 oz (144.062 kg)  BMI 47.80 kg/m2  SpO2 96%  LMP 02/08/2016  Physical Exam  Constitutional: She is oriented to person, place, and time. She appears well-developed and well-nourished. No distress.  HENT:  Head: Normocephalic and atraumatic.  Right Ear: External ear normal.  Left Ear: External ear normal.  Nose: Nose normal.  Mouth/Throat: Oropharynx is clear and moist. No oropharyngeal exudate.  TMs and canals clear bilaterally  Eyes: Conjunctivae and EOM are normal. Pupils are equal, round, and reactive to light. Right eye exhibits no discharge. Left eye exhibits no discharge. No scleral icterus.  Neck: Normal range of motion. Neck supple. No thyromegaly present.  Cardiovascular: Normal rate, regular rhythm, normal heart sounds and intact distal pulses.  Exam reveals no gallop and no friction rub.   No murmur heard. Pulmonary/Chest: Effort normal and breath sounds normal. No respiratory distress. She has no wheezes. She has no rales. She exhibits no tenderness.  Large pendulous breasts   Abdominal: Soft. Bowel sounds are normal. She exhibits no distension and no mass. There is no tenderness. There is no rebound and no guarding.  Morbidly obese  Genitourinary:  White thick discharge and some irritation of labia  Musculoskeletal: Normal range of motion. She exhibits no edema or tenderness.  Lymphadenopathy:    She has no cervical adenopathy.  Neurological: She is alert and oriented to person, place, and time. She has normal reflexes. No cranial nerve deficit. She exhibits normal muscle tone. Coordination normal.  Foot exam was without significant findings  Skin: Skin is warm and dry. No rash noted. She is not diaphoretic. No erythema. No pallor.  Psychiatric: She has a normal mood and affect. Her  behavior is normal. Judgment and thought content normal.   Assessment & Plan:   [name] was seen today for annual exam.  Diagnoses and all orders for this visit:  Encounter for routine gynecological examination -     Cytology - PAP  Knee pain, bilateral  Routine general medical examination at a health care facility  Other orders -     EPINEPHrine 0.3 mg/0.3 mL IJ SOAJ injection; Inject 0.3 mLs (0.3 mg total) into the muscle once. -     Tdap vaccine greater than or equal to 7yo IM   I have discontinued [name]'s Coenzyme Q10-Red Yeast Rice, Alpha-Lipoic Acid, Cinnamon, CHROMIUM PO, GARCINIA CAMBOGIA-CHROMIUM PO, (Capsicum, Cayenne, (CAYENNE FRUIT PO)), Astaxanthin, GREEN COFFEE BEAN PO, vitamin C, EASY TOUCH PEN NEEDLES, INSULIN SYRINGE .5CC/30GX5/16", and insulin detemir. I am also having her start on EPINEPHrine. Additionally, I am having her maintain her lisinopril, insulin aspart, metFORMIN, glucose blood, insulin glargine, Prenatal Vit w/Fe-Methylfol-FA (TL FOLATE PO), ONE TOUCH ULTRA SYSTEM KIT, and Cholecalciferol.  Meds ordered this encounter  Medications  . insulin glargine (LANTUS) 100 UNIT/ML injection    Sig: Inject 70 Units into the skin daily.  . Prenatal Vit w/Fe-Methylfol-FA (TL FOLATE PO)    Sig: Take 1,000 Units by mouth daily.  . Blood Glucose Monitoring Suppl (ONE TOUCH ULTRA SYSTEM KIT) w/Device KIT    Sig: 1 kit by Does not apply route as directed. 5-6 times daily  . Cholecalciferol (D3-50) 50000 units capsule    Sig: Take 5,000 Units by mouth daily.  Marland Kitchen EPINEPHrine 0.3 mg/0.3 mL IJ SOAJ injection    Sig: Inject 0.3 mLs (0.3 mg total) into the muscle once.    Dispense:  2 Device    Refill:  3    Product selection permitted    Order Specific Question:  Supervising Provider    Answer:  Crecencio Mc [[id number]     Follow-up: Return in about 1 year (around 02/25/2017) for CPE .

## 2016-02-27 LAB — ANA: Anti Nuclear Antibody(ANA): NEGATIVE

## 2016-02-27 LAB — C-REACTIVE PROTEIN: CRP: 5.7 mg/L — ABNORMAL HIGH (ref 0.0–4.9)

## 2016-02-27 LAB — RHEUMATOID FACTOR: Rhuematoid fact SerPl-aCnc: <10 IU/mL (ref 0.0–13.9)

## 2016-02-27 LAB — SEDIMENTATION RATE: Sed Rate: 18 mm/hr (ref 0–32)

## 2016-03-02 ENCOUNTER — Encounter: Payer: Self-pay | Admitting: Nurse Practitioner

## 2016-03-02 LAB — PAP LB AND HPV HIGH-RISK
HPV, high-risk: NEGATIVE
PAP Smear Comment: 0

## 2016-03-05 ENCOUNTER — Other Ambulatory Visit: Payer: Self-pay | Admitting: Family Medicine

## 2016-03-05 ENCOUNTER — Other Ambulatory Visit: Payer: Self-pay | Admitting: Nurse Practitioner

## 2016-03-05 ENCOUNTER — Ambulatory Visit
Admission: RE | Admit: 2016-03-05 | Discharge: 2016-03-05 | Disposition: A | Discharge status: Home / Self Care | Payer: 59 | Source: Ambulatory Visit | Attending: Nurse Practitioner | Admitting: Nurse Practitioner

## 2016-03-05 DIAGNOSIS — R921 Mammographic calcification found on diagnostic imaging of breast: Secondary | ICD-10-CM | POA: Diagnosis not present

## 2016-03-05 DIAGNOSIS — Z1239 Encounter for other screening for malignant neoplasm of breast: Secondary | ICD-10-CM

## 2016-03-05 MED ORDER — FLUCONAZOLE 150 MG PO TABS: 150.0000 mg | ORAL_TABLET | Freq: Once | ORAL | Status: DC

## 2016-03-05 NOTE — Assessment & Plan Note (Signed)
Pap and breast exam were performed today Mammogram order placed

## 2016-03-05 NOTE — Assessment & Plan Note (Signed)
Patient has multiple achy joints including the knees Checking a rheum panel through [facility]

## 2016-03-05 NOTE — Assessment & Plan Note (Signed)
Discussed acute and chronic issues. Reviewed health maintenance measures, PFSHx, and immunizations.   Tdap given today We'll get HIV testing through [facility]

## 2016-03-06 ENCOUNTER — Other Ambulatory Visit: Payer: Self-pay | Admitting: Nurse Practitioner

## 2016-03-06 DIAGNOSIS — R928 Other abnormal and inconclusive findings on diagnostic imaging of breast: Secondary | ICD-10-CM

## 2016-03-09 ENCOUNTER — Encounter: Payer: Self-pay | Admitting: Nurse Practitioner

## 2016-03-15 ENCOUNTER — Encounter: Payer: Self-pay | Admitting: *Deleted

## 2016-03-15 ENCOUNTER — Emergency Department
Admission: EM | Admit: 2016-03-15 | Discharge: 2016-03-15 | Disposition: A | Discharge status: Home / Self Care | Payer: 59 | Attending: Emergency Medicine | Admitting: Emergency Medicine

## 2016-03-15 ENCOUNTER — Emergency Department: Payer: 59

## 2016-03-15 DIAGNOSIS — J45909 Unspecified asthma, uncomplicated: Secondary | ICD-10-CM | POA: Insufficient documentation

## 2016-03-15 DIAGNOSIS — R509 Fever, unspecified: Secondary | ICD-10-CM | POA: Diagnosis present

## 2016-03-15 DIAGNOSIS — I10 Essential (primary) hypertension: Secondary | ICD-10-CM | POA: Diagnosis not present

## 2016-03-15 DIAGNOSIS — B349 Viral infection, unspecified: Secondary | ICD-10-CM | POA: Diagnosis not present

## 2016-03-15 DIAGNOSIS — Z794 Long term (current) use of insulin: Secondary | ICD-10-CM | POA: Insufficient documentation

## 2016-03-15 DIAGNOSIS — E119 Type 2 diabetes mellitus without complications: Secondary | ICD-10-CM | POA: Insufficient documentation

## 2016-03-15 DIAGNOSIS — E669 Obesity, unspecified: Secondary | ICD-10-CM | POA: Insufficient documentation

## 2016-03-15 DIAGNOSIS — E785 Hyperlipidemia, unspecified: Secondary | ICD-10-CM | POA: Diagnosis not present

## 2016-03-15 LAB — URINALYSIS COMPLETE WITH MICROSCOPIC (ARMC ONLY)
Bilirubin Urine: NEGATIVE
Glucose, UA: NEGATIVE mg/dL
Hgb urine dipstick: NEGATIVE
Ketones, ur: NEGATIVE mg/dL
Leukocytes, UA: NEGATIVE
Nitrite: NEGATIVE
Protein, ur: NEGATIVE mg/dL
Specific Gravity, Urine: 1.02 (ref 1.005–1.030)
pH: 6 (ref 5.0–8.0)

## 2016-03-15 LAB — COMPREHENSIVE METABOLIC PANEL
ALT: 135 U/L — ABNORMAL HIGH (ref 14–54)
AST: 97 U/L — ABNORMAL HIGH (ref 15–41)
Albumin: 3.8 g/dL (ref 3.5–5.0)
Alkaline Phosphatase: 54 U/L (ref 38–126)
Anion gap: 11 (ref 5–15)
BUN: 12 mg/dL (ref 6–20)
CO2: 25 mmol/L (ref 22–32)
Calcium: 8.9 mg/dL (ref 8.9–10.3)
Chloride: 101 mmol/L (ref 101–111)
Creatinine, Ser: 0.6 mg/dL (ref 0.44–1.00)
GFR calc Af Amer: >60 mL/min (ref >60)
GFR calc non Af Amer: >60 mL/min (ref >60)
Glucose, Bld: 169 mg/dL — ABNORMAL HIGH (ref 65–99)
Potassium: 4.3 mmol/L (ref 3.5–5.1)
Sodium: 137 mmol/L (ref 135–145)
Total Bilirubin: 0.9 mg/dL (ref 0.3–1.2)
Total Protein: 7.3 g/dL (ref 6.5–8.1)

## 2016-03-15 LAB — LACTIC ACID, PLASMA: Lactic Acid, Venous: 1.5 mmol/L (ref 0.5–2.0)

## 2016-03-15 LAB — CBC WITH DIFFERENTIAL/PLATELET
Basophils Absolute: 0 10*3/uL (ref 0–0.1)
Basophils Relative: 1 %
Eosinophils Absolute: 0.1 10*3/uL (ref 0–0.7)
Eosinophils Relative: 2 %
HCT: 39.8 % (ref 35.0–47.0)
Hemoglobin: 13.3 g/dL (ref 12.0–16.0)
Lymphocytes Relative: 39 %
Lymphs Abs: 2.2 10*3/uL (ref 1.0–3.6)
MCH: 29.9 pg (ref 26.0–34.0)
MCHC: 33.3 g/dL (ref 32.0–36.0)
MCV: 89.6 fL (ref 80.0–100.0)
Monocytes Absolute: 0.5 10*3/uL (ref 0.2–0.9)
Monocytes Relative: 9 %
Neutro Abs: 2.9 10*3/uL (ref 1.4–6.5)
Neutrophils Relative %: 49 %
Platelets: 175 10*3/uL (ref 150–440)
RBC: 4.44 MIL/uL (ref 3.80–5.20)
RDW: 13.7 % (ref 11.5–14.5)
WBC: 5.7 10*3/uL (ref 3.6–11.0)

## 2016-03-15 MED ORDER — DIPHENHYDRAMINE HCL 50 MG/ML IJ SOLN
25.0000 mg | Freq: Once | INTRAMUSCULAR | Status: AC
  Administered 2016-03-15: 25 mg via INTRAVENOUS
  Filled 2016-03-15: qty 1

## 2016-03-15 MED ORDER — METOCLOPRAMIDE HCL 5 MG/ML IJ SOLN
20.0000 mg | Freq: Once | INTRAMUSCULAR | Status: AC
Start: 2016-03-15 — End: 2016-03-15
  Administered 2016-03-15: 20 mg via INTRAVENOUS
  Filled 2016-03-15: qty 4

## 2016-03-15 MED ORDER — ACETAMINOPHEN 325 MG PO TABS
650.0000 mg | ORAL_TABLET | Freq: Once | ORAL | Status: AC
  Administered 2016-03-15: 650 mg via ORAL
  Filled 2016-03-15: qty 2

## 2016-03-15 MED ORDER — SODIUM CHLORIDE 0.9 % IV SOLN
1000.0000 mL | Freq: Once | INTRAVENOUS | Status: AC
  Administered 2016-03-15: 1000 mL via INTRAVENOUS

## 2016-03-15 NOTE — ED Notes (Addendum)
Pt presents w/ c/o headache. Pt has run a fever over 101 at home since last night. Pt took ibuprofen 600 mg at 0500 today. Pt is tachycardic and has generalized body aches. Pt works for [employer] in [place].

## 2016-03-15 NOTE — ED Notes (Signed)
Patient states that she has had fever, body aches and headache that started yesterday. Patient denies shortness of breath, burning urination and cough.

## 2016-03-15 NOTE — ED Provider Notes (Signed)
[facility] Emergency Department Provider Note  ____________________________________________    I have reviewed the triage vital signs and the nursing notes.   HISTORY  Chief Complaint Fever    HPI [name] is a 50 y.o. female who presents with complaints of fever and body aches. Patient reports the symptoms started last night. She denies cough or shortness of breath. No rash. No dysuria. No abdominal pain. 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 She does have history of diabetes. She started victoza 4 days ago. No recent travel.     Past Medical History  Diagnosis Date  . Asthma   . Hypertension   . Hyperlipidemia   . Migraine     Has used imitrex  . Kidney stone   . Diabetes mellitus without complication [facility])     Patient Active Problem List   Diagnosis Date Noted  . Nocturnal leg cramps 01/21/2015  . Elevated liver enzymes 09/04/2014  . Vitamin D deficiency 09/03/2014  . Encounter for routine gynecological examination 06/18/2014  . Diabetes type 2, uncontrolled ([place]) 01/22/2014  . Routine general medical examination at a health care facility 01/11/2014  . Numbness and tingling of both legs 01/11/2014  . Knee pain, bilateral 01/11/2014  . Obesity 01/11/2014  . HTN (hypertension) 01/11/2014    Past Surgical History  Procedure Laterality Date  . Cesarean section    . Tonsillectomy and adenoidectomy      Current Outpatient Rx  Name  Route  Sig  Dispense  Refill  . VICTOZA 18 MG/3ML SOPN   Subcutaneous   Inject 1.8 mg into the skin daily.      3     Dispense as written.   . Blood Glucose Monitoring Suppl (ONE TOUCH ULTRA SYSTEM KIT) w/Device KIT   Does not apply   1 kit by Does not apply route as directed. 5-6 times daily         . Cholecalciferol (D3-50) 50000 units capsule   Oral   Take 5,000 Units by mouth daily.         Marland Kitchen EPINEPHrine 0.3 mg/0.3 mL IJ SOAJ injection   Intramuscular   Inject 0.3 mLs  (0.3 mg total) into the muscle once.   2 Device   3     Product selection permitted   . fluconazole (DIFLUCAN) 150 MG tablet   Oral   Take 1 tablet (150 mg total) by mouth once.   2 tablet   0   . glucose blood (ONE TOUCH ULTRA TEST) test strip      Check sugar 4 times daily   400 each   3   . insulin aspart (NOVOLOG) 100 UNIT/ML injection   Subcutaneous   Inject 20 Units into the skin 3 (three) times daily before meals. 22 units per meal and 10 units per snack.         . insulin glargine (LANTUS) 100 UNIT/ML injection   Subcutaneous   Inject 70 Units into the skin daily.         Marland Kitchen lisinopril (PRINIVIL,ZESTRIL) 20 MG tablet      Take 1 tablet by mouth  daily   90 tablet   4   . metFORMIN (GLUCOPHAGE) 500 MG tablet      Take 1 tablet by mouth two  times daily with meals   180 tablet   1   . Prenatal Vit w/Fe-Methylfol-FA (TL FOLATE PO)   Oral   Take 1,000 Units by mouth  daily.           Allergies Levemir and Nsaids  Family History  Problem Relation Age of Onset  . Alcohol abuse Mother   . Hyperlipidemia Mother   . Hypertension Mother   . Hyperlipidemia Father   . Hypertension Father   . Parkinson's disease Father   . Drug abuse Sister   . Cancer Maternal Grandmother     breast cancer  . Breast cancer Maternal Grandmother   . Cancer Paternal Grandmother     Social History Social History  Substance Use Topics  . Smoking status: Never Smoker   . Smokeless tobacco: Never Used  . Alcohol Use: No    Review of Systems Constitutional: Negative for fever. Eyes: Negative for redness ENT: Negative for sore throat Cardiovascular: Negative for chest pain Respiratory: Negative for shortness of breath.No cough Gastrointestinal: Negative for abdominal pain Genitourinary: Negative for dysuria.  Musculoskeletal: Negative for joint pain, positive for myalgias Skin: Negative for rash. Neurological: Negative for focal weakness Psychiatric: no  anxiety    ____________________________________________   PHYSICAL EXAM:  VITAL SIGNS: ED Triage Vitals  Enc Vitals Group     BP 03/15/16 0641 158/89 mmHg     Pulse Rate 03/15/16 0641 113     Resp 03/15/16 0641 20     Temp 03/15/16 0641 99 F (37.2 C)     Temp Source 03/15/16 0641 Oral     SpO2 03/15/16 0641 95 %     Weight 03/15/16 0641 315 lb (142.883 kg)     Height 03/15/16 0641 '5\' 8"'$  (1.727 m)     Head Cir --      Peak Flow --      Pain Score 03/15/16 0642 7     Pain Loc --      Pain Edu? --      Excl. in [place]? --      Constitutional: Alert and oriented. Well appearing and in no distress.  Eyes: Conjunctivae are normal. No erythema or injection, PERRLA ENT   Head: Normocephalic and atraumatic. No meningismus   Mouth/Throat: Mucous membranes are moist. Cardiovascular: Normal rate, regular rhythm. Normal and symmetric distal pulses are present in the upper extremities. Respiratory: Normal respiratory effort without tachypnea nor retractions. Breath sounds are clear and equal bilaterally.  Gastrointestinal: Soft and non-tender in all quadrants. No distention. There is no CVA tenderness. Genitourinary: deferred Musculoskeletal: Nontender with normal range of motion in all extremities. No lower extremity tenderness nor edema. Neurologic:  Normal speech and language. No gross focal neurologic deficits are appreciated. Skin:  Skin is warm, dry and intact. No rash noted. Psychiatric: Mood and affect are normal. Patient exhibits appropriate insight and judgment.  ____________________________________________    LABS (pertinent positives/negatives)  Labs Reviewed  COMPREHENSIVE METABOLIC PANEL - Abnormal; Notable for the following:    Glucose, Bld 169 (*)    AST 97 (*)    ALT 135 (*)    All other components within normal limits  URINALYSIS COMPLETEWITH MICROSCOPIC ([facility] ONLY) - Abnormal; Notable for the following:    Color, Urine YELLOW (*)    APPearance CLEAR  (*)    Bacteria, UA RARE (*)    Squamous Epithelial / LPF 0-5 (*)    All other components within normal limits  URINE CULTURE  LACTIC ACID, PLASMA  CBC WITH DIFFERENTIAL/PLATELET    ____________________________________________   EKG  None  ____________________________________________    RADIOLOGY  Chest x-ray unremarkable  ____________________________________________   PROCEDURES  Procedure(s) performed:  none  Critical Care performed: none  ____________________________________________   INITIAL IMPRESSION / ASSESSMENT AND PLAN / ED COURSE  Pertinent labs & imaging results that were available during my care of the patient were reviewed by me and considered in my medical decision making (see chart for details).  Patient presents with complaints of fever, body aches and a headache. After by mouth Tylenol, IV Reglan and IV Benadryl she no longer has a headache and feels significantly better. She reports she is also started to develop diarrhea now.  I recommended spinal tap given her complaint of fever and headache but she refused. She reports she will come back to the hospital if she has worsening symptoms. I do suspect she has a viral illness and overall I feel this is a reasonable plan.  ____________________________________________   FINAL CLINICAL IMPRESSION(S) / ED DIAGNOSES  Final diagnoses:  Viral illness          Lavonia Drafts, MD 03/15/16 1125

## 2016-03-16 ENCOUNTER — Inpatient Hospital Stay: Admission: RE | Admit: 2016-03-16 | Payer: 59 | Source: Ambulatory Visit

## 2016-03-17 LAB — URINE CULTURE: Special Requests: NORMAL

## 2016-03-31 ENCOUNTER — Ambulatory Visit
Admission: RE | Admit: 2016-03-31 | Discharge: 2016-03-31 | Disposition: A | Discharge status: Home / Self Care | Payer: 59 | Source: Ambulatory Visit | Attending: Nurse Practitioner | Admitting: Nurse Practitioner

## 2016-03-31 DIAGNOSIS — R928 Other abnormal and inconclusive findings on diagnostic imaging of breast: Secondary | ICD-10-CM

## 2016-04-03 ENCOUNTER — Encounter: Payer: Self-pay | Admitting: Nurse Practitioner

## 2016-04-20 ENCOUNTER — Encounter: Payer: Self-pay | Admitting: Nurse Practitioner

## 2016-04-21 DIAGNOSIS — K449 Diaphragmatic hernia without obstruction or gangrene: Secondary | ICD-10-CM | POA: Insufficient documentation

## 2016-07-18 IMAGING — MG MM DIGITAL DIAGNOSTIC UNILAT*R*
2 series · 2 of 2 positions shown · non-contrast
Comparison: Previous exam(s).

CLINICAL DATA: Post stereotactic guided biopsy of calcifications in
the lower outer right breast.

EXAM:
DIAGNOSTIC RIGHT MAMMOGRAM POST ULTRASOUND BIOPSY

[R CC]
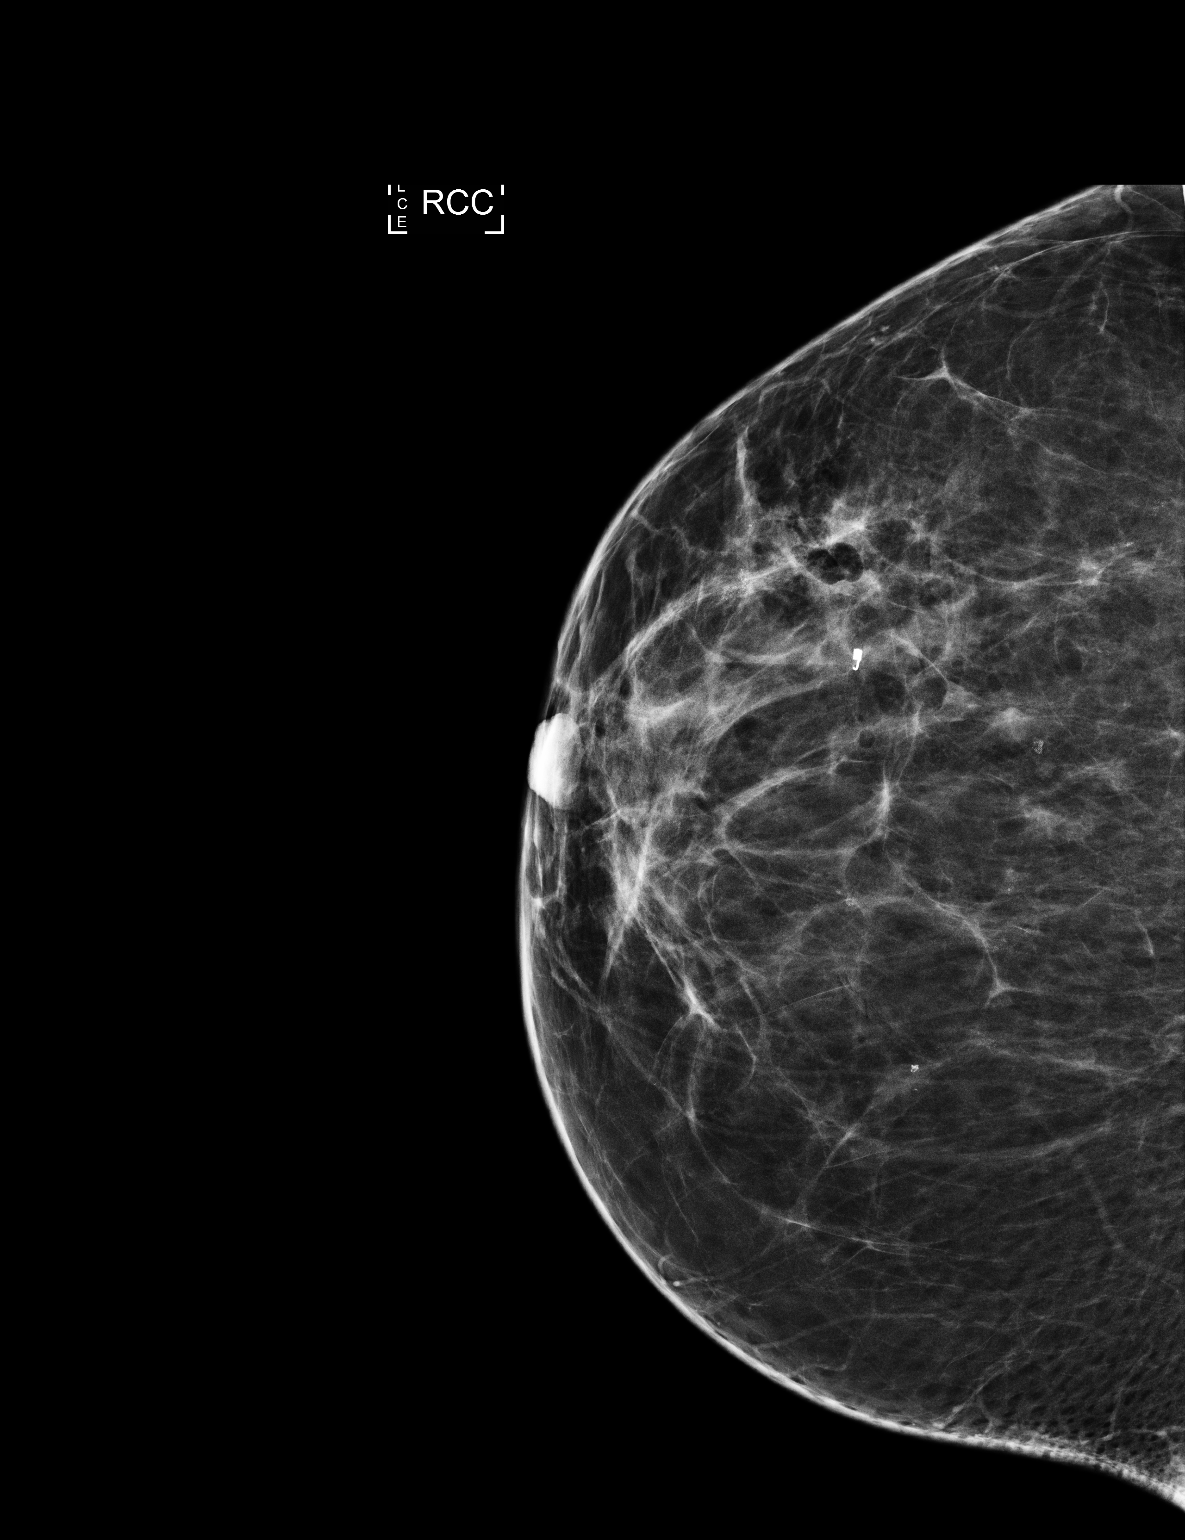

[R LM]
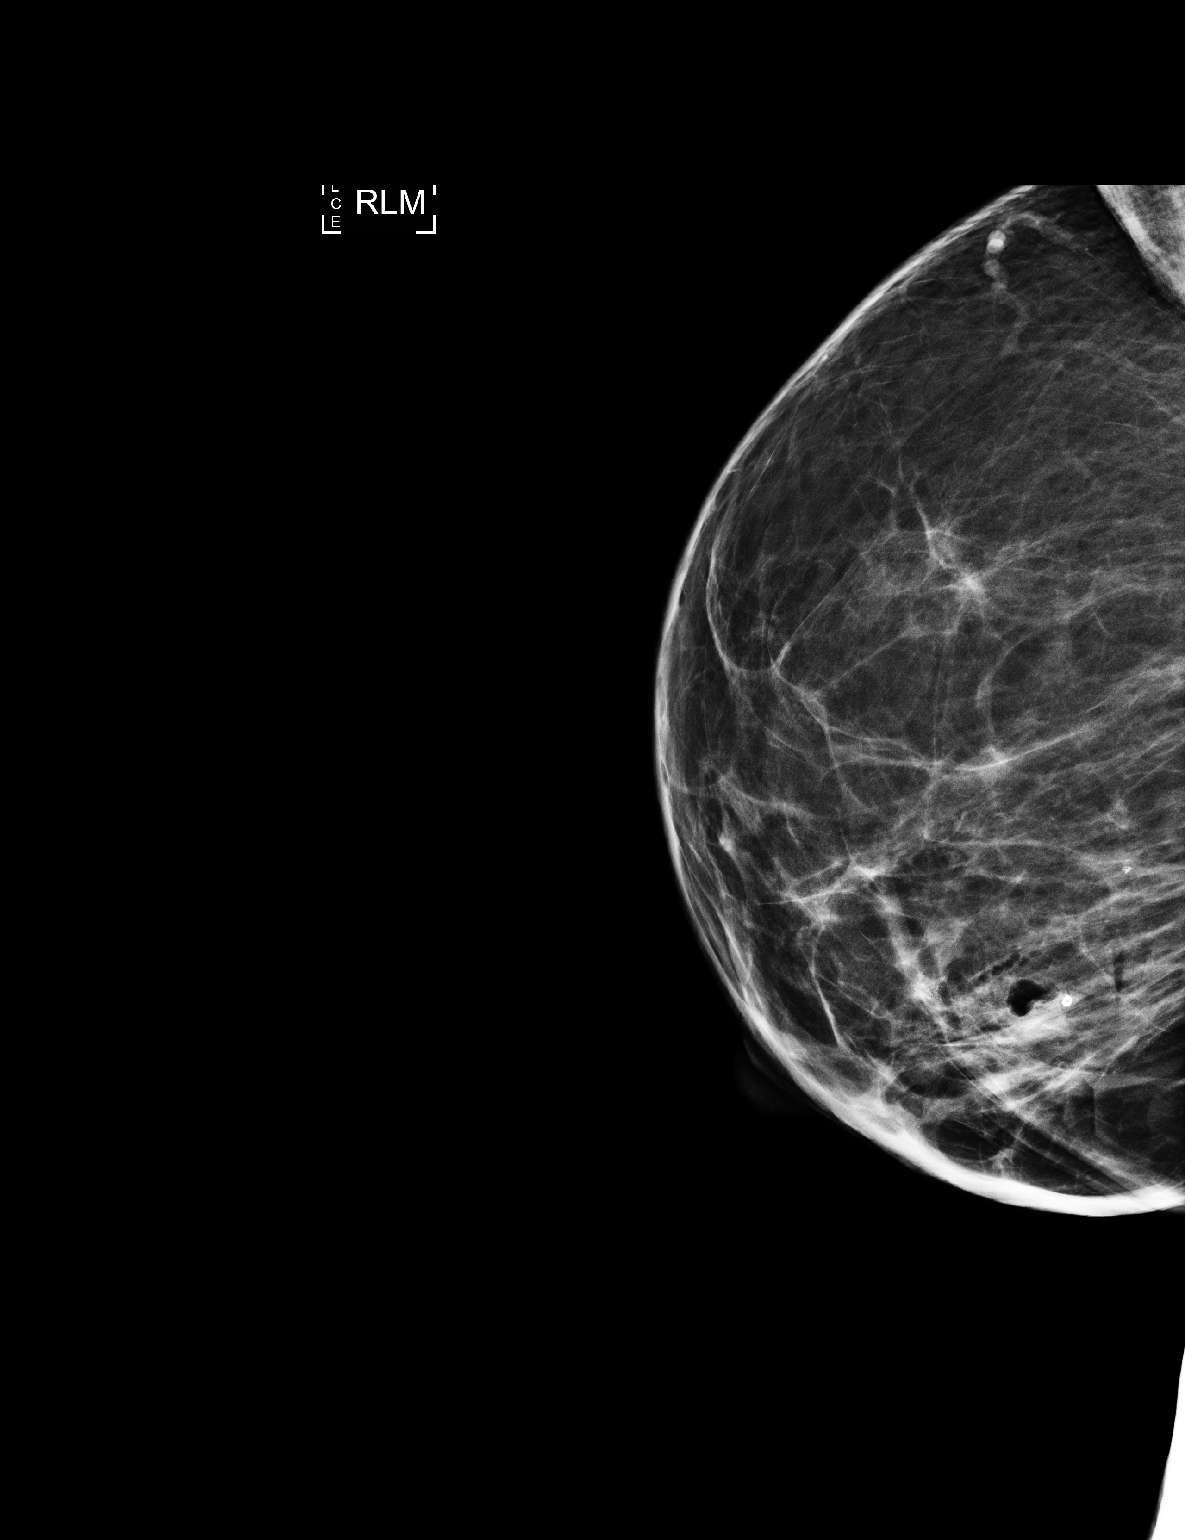

[2 of 2 positions shown; findings below may reference images not displayed]

FINDINGS: Mammographic images were obtained following stereotactic guided
biopsy of calcifications in the lower outer right breast. A coil
shaped biopsy marking clip is present at the site of the biopsied
calcifications in the right breast.
IMPRESSION: Appropriate coil shaped biopsy marking clip post stereotactic guided
biopsy of calcifications in the lower outer right breast.

Final Assessment: Post Procedure Mammograms for Marker Placement

## 2016-07-18 IMAGING — MG MM BREAST BX W/ LOC DEV 1ST LESION IMAGE BX SPEC STEREO GUIDE*R*
3 series · 3 of 11 positions shown · non-contrast
Comparison: Previous exams.

ADDENDUM:
Pathology revealed FIBROCYSTIC CHANGES WITH CALCIFICATIONS of the
lower outer quadrant of the Right breast. This was found to be
concordant by Dr. Safet Braco Daic. Pathology results were discussed
with the patient by telephone. The patient reported doing well after
the biopsy with tenderness at the site. Post biopsy instructions and
care were reviewed and questions were answered. The patient was
encouraged to call The [REDACTED] for any
additional concerns. The patient was instructed to return for Right
diagnostic mammography in 6 months and informed a reminder notice
would be sent regarding this appointment.

Pathology results reported by Autovaga Margis, RN on 04/01/2016.
CLINICAL DATA: 49-year-old female with indeterminate calcifications
in the lower outer right breast.
EXAM:
RIGHT BREAST STEREOTACTIC CORE NEEDLE BIOPSY

[R LM]
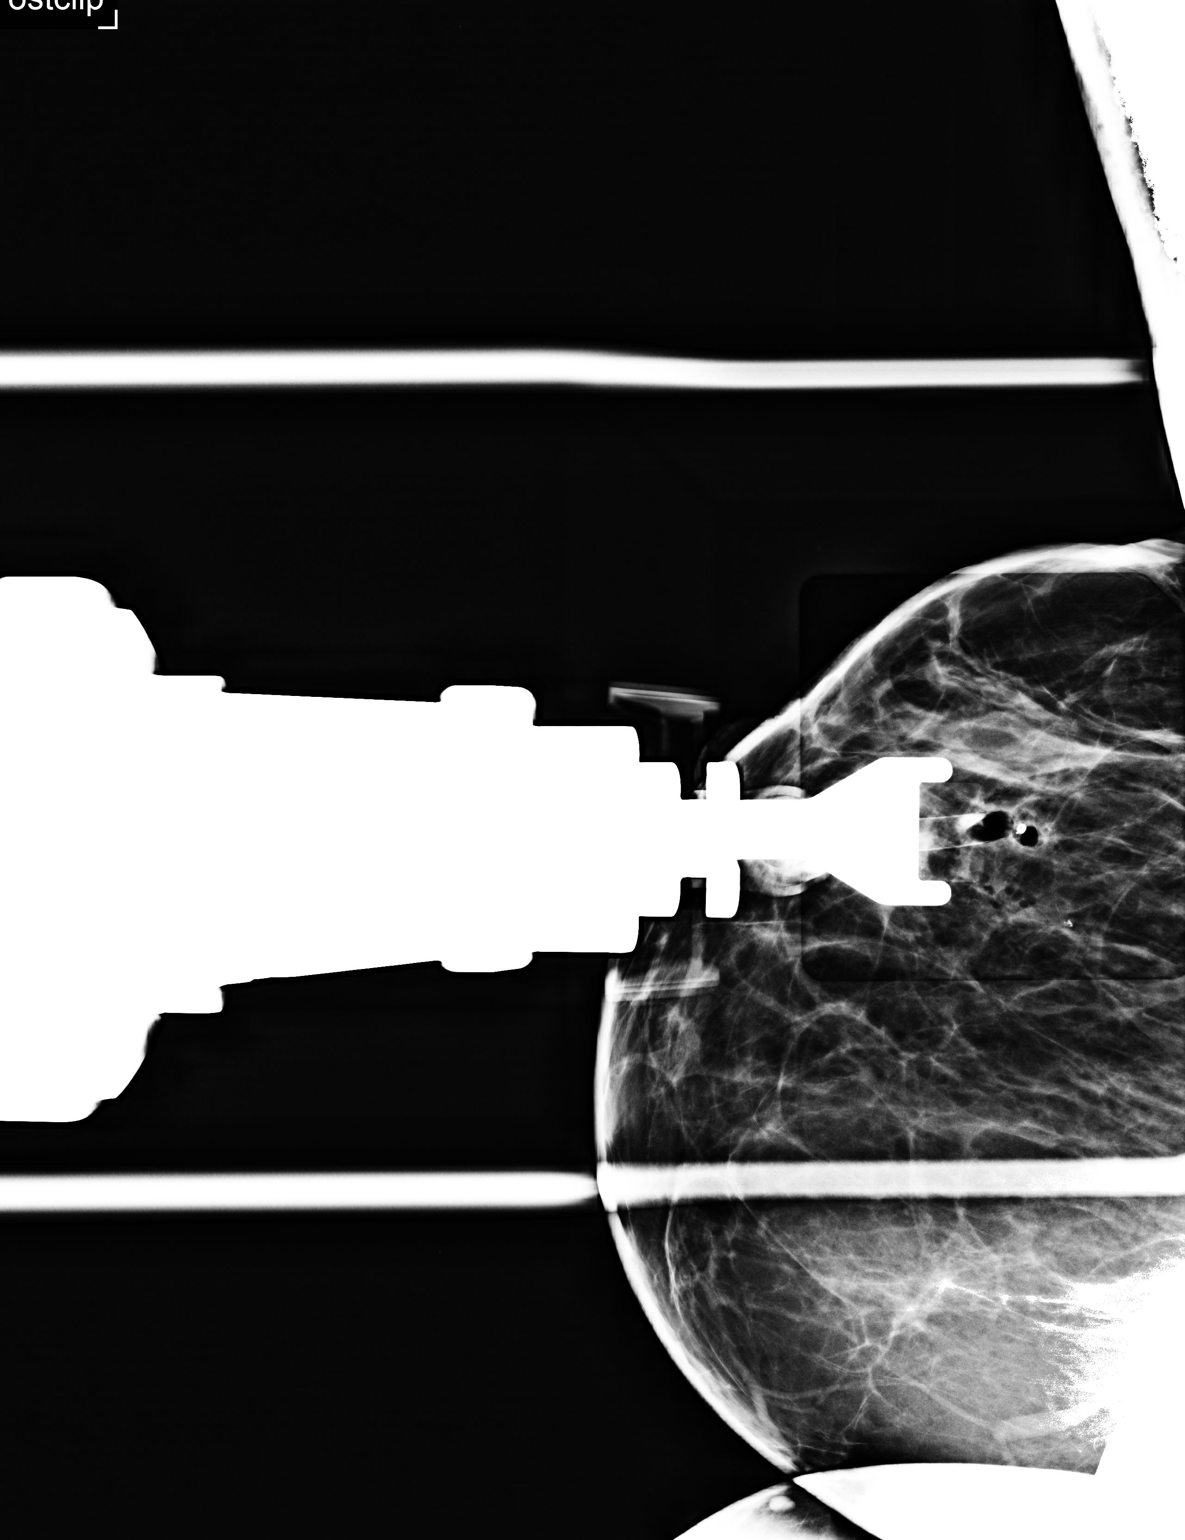

[R LM tomo (1 of 2) · tomo slice 33/64.0]
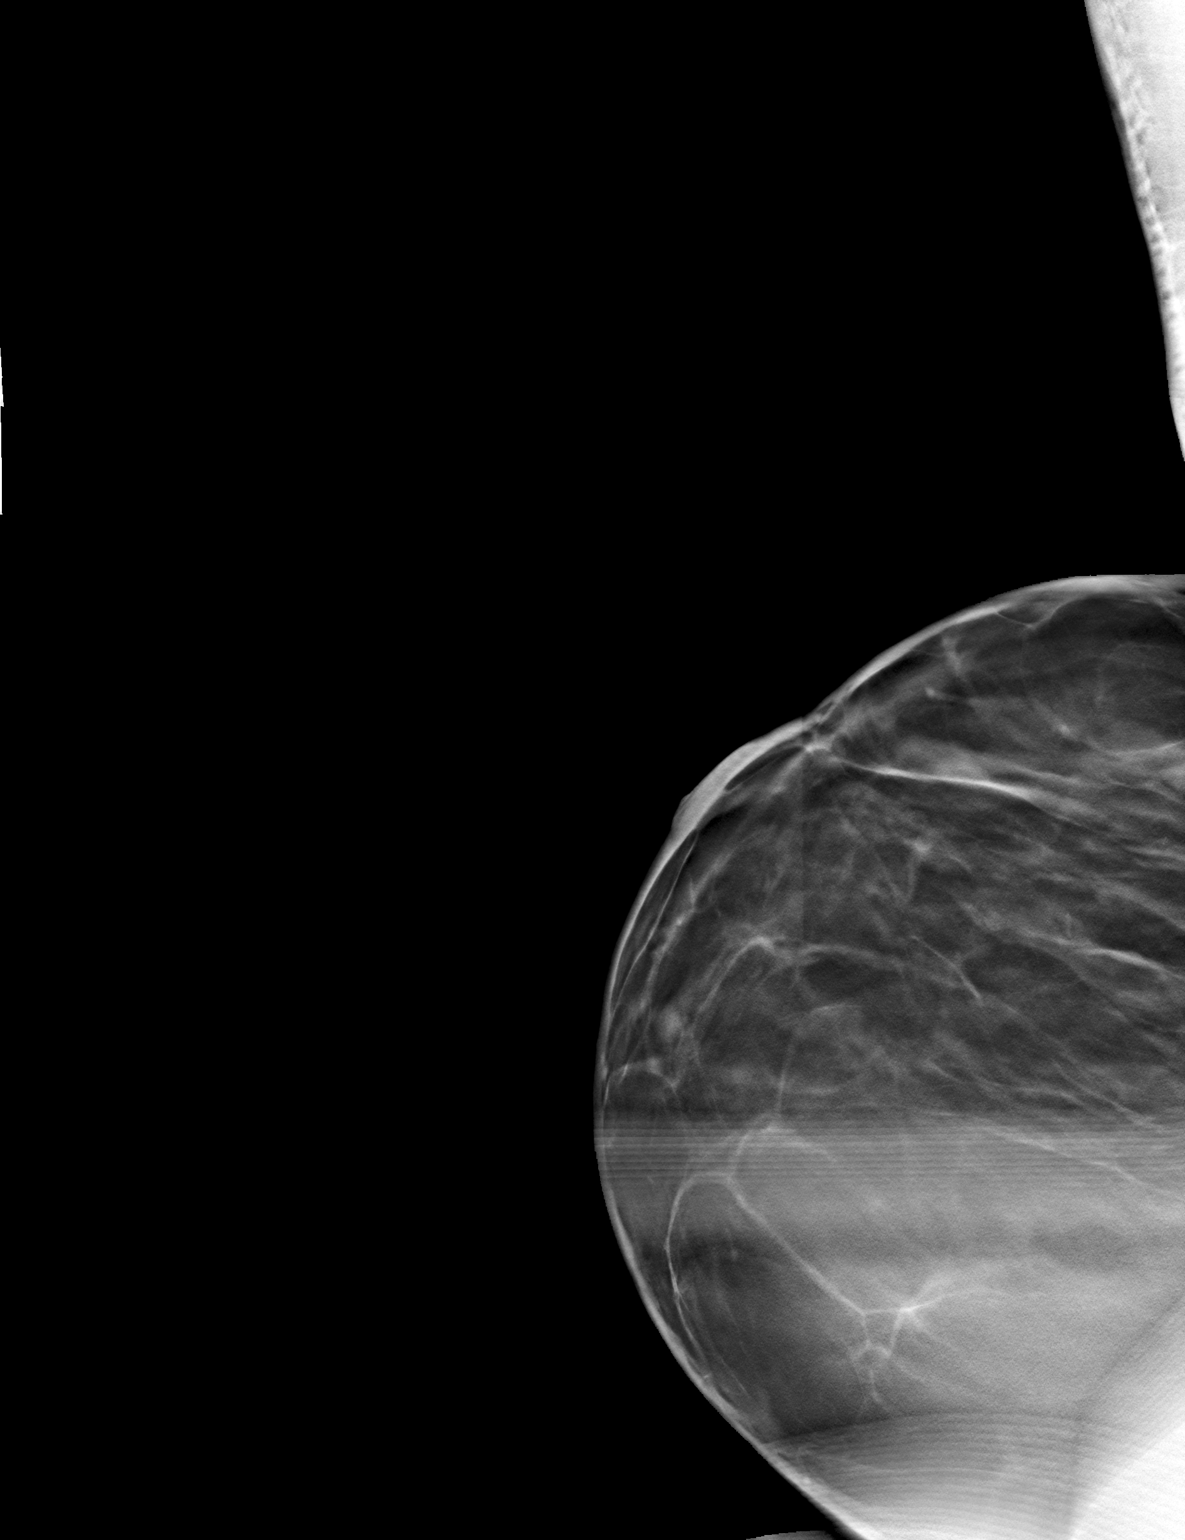

[R LM tomo (2 of 2) · tomo slice 32/63.0]
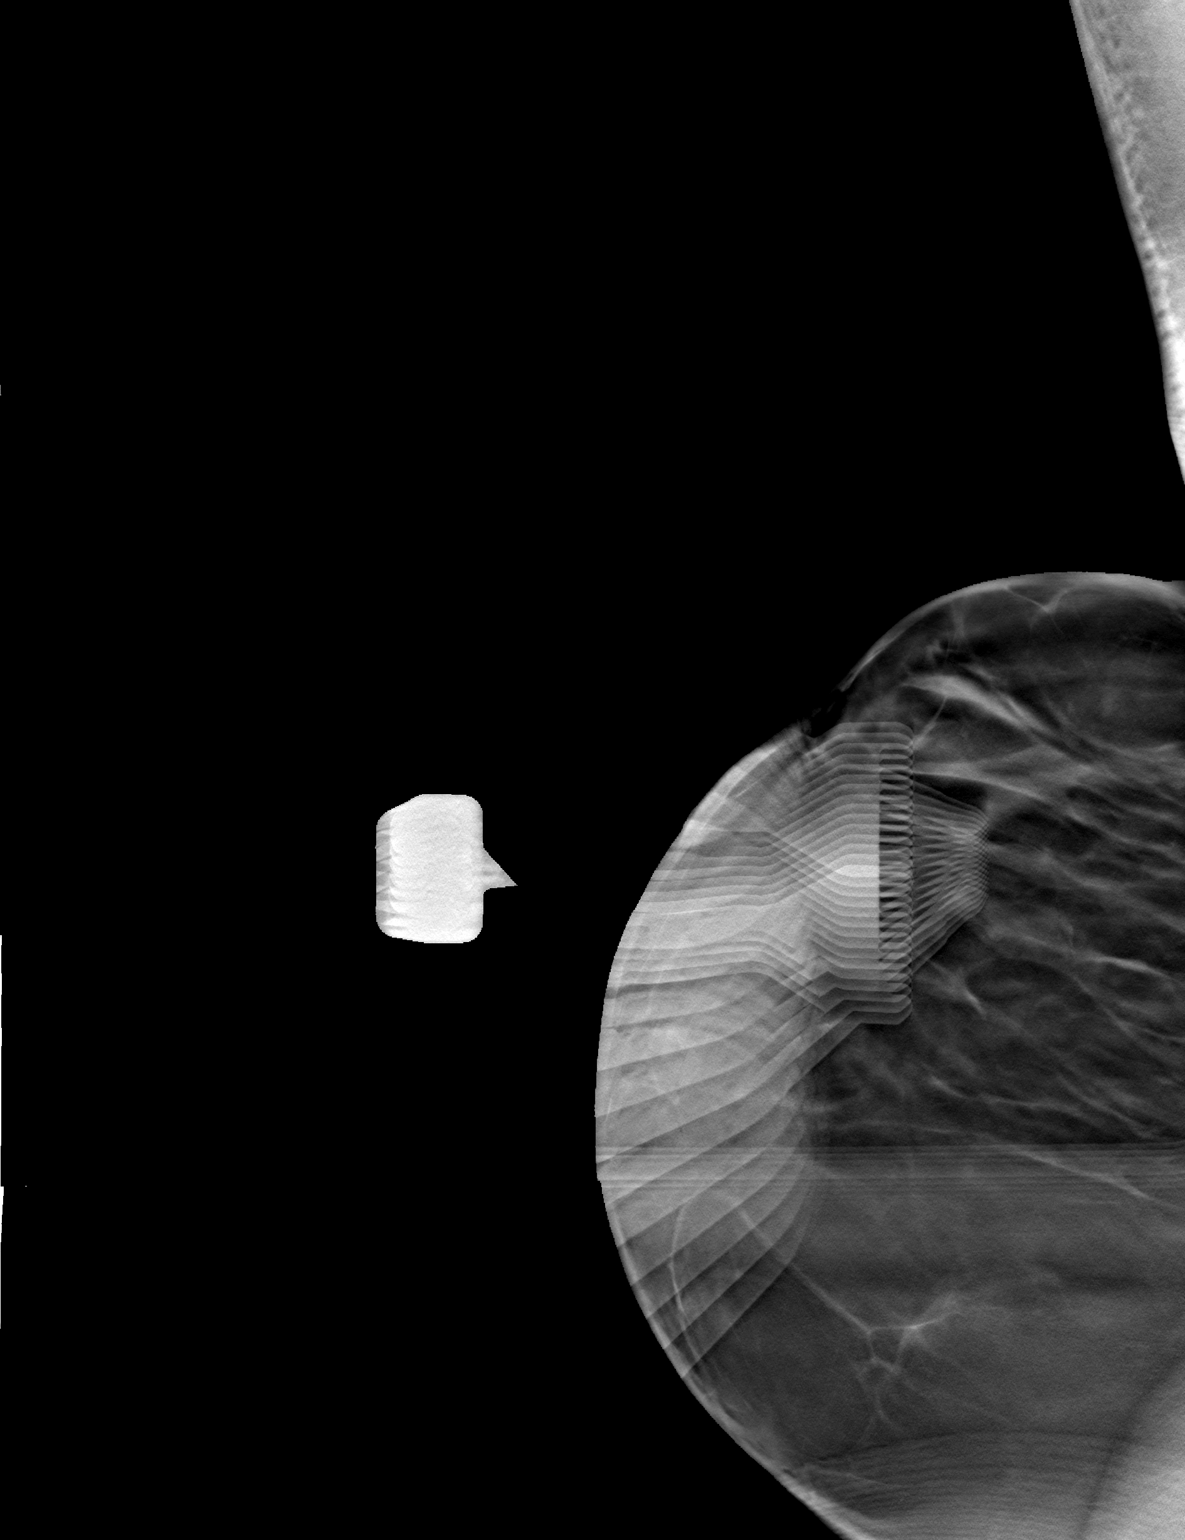

[3 of 11 positions shown; findings below may reference images not displayed]



Using sterile technique and 1% Lidocaine as local anesthetic, under
stereotactic guidance, a 9 gauge vacuum device was used to perform
core needle biopsy of the calcifications in the lower outer left
breast using a lateral to medial approach. Specimen radiograph was
performed showing the presence of calcifications. Specimens with
calcifications are identified for pathology.

At the conclusion of the procedure, a coil shaped tissue marker clip
was deployed into the biopsy cavity. Follow-up 2-view mammogram was
performed and dictated separately.
IMPRESSION: Stereotactic-guided biopsy of calcifications in the lower outer
right breast. No apparent complications.

## 2016-08-03 DIAGNOSIS — Z9884 Bariatric surgery status: Secondary | ICD-10-CM

## 2016-08-03 HISTORY — DX: Bariatric surgery status: Z98.84

## 2016-08-03 HISTORY — PX: BARIATRIC SURGERY: SHX1103

## 2016-09-21 ENCOUNTER — Ambulatory Visit
Admission: RE | Admit: 2016-09-21 | Discharge: 2016-09-21 | Disposition: A | Discharge status: Home / Self Care | Payer: 59 | Source: Ambulatory Visit | Attending: Family | Admitting: Family

## 2016-09-21 ENCOUNTER — Encounter: Payer: Self-pay | Admitting: Family

## 2016-09-21 ENCOUNTER — Ambulatory Visit (INDEPENDENT_AMBULATORY_CARE_PROVIDER_SITE_OTHER): Payer: 59 | Admitting: Family

## 2016-09-21 ENCOUNTER — Other Ambulatory Visit: Payer: Self-pay | Admitting: Family

## 2016-09-21 VITALS — BP 160/100 | HR 73 | Temp 98.0°F | Ht 68.0 in | Wt 269.0 lb

## 2016-09-21 DIAGNOSIS — B372 Candidiasis of skin and nail: Secondary | ICD-10-CM | POA: Insufficient documentation

## 2016-09-21 DIAGNOSIS — Z3202 Encounter for pregnancy test, result negative: Secondary | ICD-10-CM | POA: Diagnosis not present

## 2016-09-21 DIAGNOSIS — N200 Calculus of kidney: Secondary | ICD-10-CM | POA: Diagnosis not present

## 2016-09-21 DIAGNOSIS — Z9884 Bariatric surgery status: Secondary | ICD-10-CM | POA: Insufficient documentation

## 2016-09-21 DIAGNOSIS — I10 Essential (primary) hypertension: Secondary | ICD-10-CM

## 2016-09-21 DIAGNOSIS — R932 Abnormal findings on diagnostic imaging of liver and biliary tract: Secondary | ICD-10-CM

## 2016-09-21 DIAGNOSIS — E785 Hyperlipidemia, unspecified: Secondary | ICD-10-CM | POA: Insufficient documentation

## 2016-09-21 DIAGNOSIS — R109 Unspecified abdominal pain: Secondary | ICD-10-CM | POA: Diagnosis not present

## 2016-09-21 LAB — POCT URINALYSIS DIPSTICK
Blood, UA: NEGATIVE
Glucose, UA: NEGATIVE
Leukocytes, UA: NEGATIVE
Nitrite, UA: NEGATIVE
Spec Grav, UA: >=1.03
Urobilinogen, UA: 1
pH, UA: 5.5

## 2016-09-21 LAB — POCT URINE PREGNANCY: Preg Test, Ur: NEGATIVE

## 2016-09-21 MED ORDER — CLOTRIMAZOLE 1 % EX CREA
1.0000 | TOPICAL_CREAM | Freq: Two times a day (BID) | CUTANEOUS | 1 refills | Status: DC
Start: 2016-09-21 — End: 2016-10-06

## 2016-09-21 NOTE — Progress Notes (Signed)
Subjective:    Patient ID: [name], female    DOB: 25-Aug-1966, 50 y.o.   MRN: [id number]  CC: [name] is a 50 y.o. female who presents today for follow up.   HPI: Patient here for acute visit for right flank pain. H/o renal stones. Notes blood in urine. Works at [employer] and tested urine which was positive for Ca oxalate.  Feels 'urethra spasm'. Able to urinate. Last stone 2013 and had hydronephrosis. No fever, chills, N, V, abdominal pain.   Obesity-Had bariatric surgery called DS 08/03/16 and no longer has DM. No longer on any medications.   She also complains of itchy, red, foul-smelling rash under pannus after surgery. Improving on nystatin. No fever.      HISTORY:  Past Medical History:  Diagnosis Date  . Asthma   . Diabetes mellitus without complication ([place])   . Hyperlipidemia   . Hypertension   . Kidney stone   . Migraine    Has used imitrex   Past Surgical History:  Procedure Laterality Date  . CESAREAN SECTION    . TONSILLECTOMY AND ADENOIDECTOMY     Family History  Problem Relation Age of Onset  . Alcohol abuse Mother   . Hyperlipidemia Mother   . Hypertension Mother   . Hyperlipidemia Father   . Hypertension Father   . Parkinson's disease Father   . Drug abuse Sister   . Cancer Maternal Grandmother     breast cancer  . Breast cancer Maternal Grandmother   . Cancer Paternal Grandmother     Allergies: Other; Tolmetin; Levemir [insulin detemir]; and Nsaids Current Outpatient Prescriptions on File Prior to Visit  Medication Sig Dispense Refill  . Blood Glucose Monitoring Suppl (ONE TOUCH ULTRA SYSTEM KIT) w/Device KIT 1 kit by Does not apply route as directed. 5-6 times daily    . Cholecalciferol (D3-50) 50000 units capsule Take 5,000 Units by mouth daily.    Marland Kitchen EPINEPHrine 0.3 mg/0.3 mL IJ SOAJ injection Inject 0.3 mLs (0.3 mg total) into the muscle once. 2 Device 3  . fluconazole (DIFLUCAN) 150 MG tablet Take 1 tablet (150 mg  total) by mouth once. 2 tablet 0  . glucose blood (ONE TOUCH ULTRA TEST) test strip Check sugar 4 times daily 400 each 3  . insulin aspart (NOVOLOG) 100 UNIT/ML injection Inject 20 Units into the skin 3 (three) times daily before meals. 22 units per meal and 10 units per snack.    . insulin glargine (LANTUS) 100 UNIT/ML injection Inject 70 Units into the skin daily.    Marland Kitchen lisinopril (PRINIVIL,ZESTRIL) 20 MG tablet Take 1 tablet by mouth  daily 90 tablet 4  . metFORMIN (GLUCOPHAGE) 500 MG tablet Take 1 tablet by mouth two  times daily with meals 180 tablet 1  . Prenatal Vit w/Fe-Methylfol-FA (TL FOLATE PO) Take 1,000 Units by mouth daily.    Marland Kitchen VICTOZA 18 MG/3ML SOPN Inject 1.8 mg into the skin daily.  3   No current facility-administered medications on file prior to visit.     Social History  Substance Use Topics  . Smoking status: Never Smoker  . Smokeless tobacco: Never Used  . Alcohol use No    Review of Systems  Constitutional: Negative for chills and fever.  Respiratory: Negative for cough.   Cardiovascular: Negative for chest pain and palpitations.  Gastrointestinal: Negative for abdominal distention, abdominal pain, blood in stool, nausea and vomiting.  Genitourinary: Positive for flank pain and hematuria. Negative for  decreased urine volume and dysuria.  Skin: Positive for rash.      Objective:    BP (!) 160/100   Pulse 73   Temp 98 F (36.7 C) (Oral)   Ht _0  (1.727 m)   Wt 269 lb (122 kg)   SpO2 97%   BMI 40.90 kg/m  BP Readings from Last 3 Encounters:  09/21/16 (!) 160/100  03/15/16 126/80  02/26/16 (!) 142/88   Wt Readings from Last 3 Encounters:  09/21/16 269 lb (122 kg)  03/15/16 (!) 315 lb (142.9 kg)  02/26/16 (!) 317 lb 9.6 oz (144.1 kg)    Physical Exam  Constitutional: She appears well-developed and well-nourished.  Cardiovascular: Normal rate, regular rhythm, normal heart sounds and normal pulses.   Pulmonary/Chest: Effort normal and breath  sounds normal. She has no wheezes. She has no rhonchi. She has no rales.  Abdominal: There is CVA tenderness (right).  Neurological: She is alert.  Skin: Skin is warm and dry. Rash noted. Rash is maculopapular.  Hyper erythematous area under pannus. White residue. Foul smelling. Skin intact. No streaking.   Psychiatric: She has a normal mood and affect. Her speech is normal and behavior is normal. Thought content normal.  Vitals reviewed.      Assessment & Plan:   Problem List Items Addressed This Visit      Cardiovascular and Mediastinum   HTN (hypertension)    Elevated. Advised BP log. Suspect pain may be contributing today.         Musculoskeletal and Integument   Candidal intertrigo    Symptoms consistent with Candida. Nystatin is not working. Trial of clotrimazole. Return precautions given.      Relevant Medications   clotrimazole (LOTRIMIN) 1 % cream     Other   Right flank pain - Primary    Concern for renal stone. Pending CT scan. Negative pregnancy urine. UA shows trace ketones, bilirubin. Negative blood      Relevant Orders   POCT urinalysis dipstick (Completed)   CT RENAL STONE STUDY   POCT urine pregnancy (Completed)    Other Visit Diagnoses    Pregnancy examination or test, negative result       Relevant Orders   POCT urine pregnancy (Completed)       I am having [name] start on clotrimazole. I am also having her maintain her lisinopril, insulin aspart, metFORMIN, glucose blood, insulin glargine, Prenatal Vit w/Fe-Methylfol-FA (TL FOLATE PO), ONE TOUCH ULTRA SYSTEM KIT, Cholecalciferol, EPINEPHrine, fluconazole, and VICTOZA.   Meds ordered this encounter  Medications  . clotrimazole (LOTRIMIN) 1 % cream    Sig: Apply 1 application topically 2 (two) times daily.    Dispense:  30 g    Refill:  1    Order Specific Question:   Supervising Provider    Answer:   Crecencio Mc [[id number]]    Return precautions given.   Risks, benefits, and  alternatives of the medications and treatment plan prescribed today were discussed, and patient expressed understanding.   Education regarding symptom management and diagnosis given to patient on AVS.  Continue to follow with [name], FNP for routine health maintenance.   [name] and I agreed with plan.   [name], FNP

## 2016-09-21 NOTE — Patient Instructions (Signed)
Ct renal  If pain worsens overnight, concern for obstructing stone, please go to ED.    If there is no improvement in your symptoms, or if there is any worsening of symptoms, or if you have any additional concerns, please return for re-evaluation; or, if we are closed, consider going to the Emergency Room for evaluation if symptoms urgent.

## 2016-09-21 NOTE — Assessment & Plan Note (Signed)
Elevated. Advised BP log. Suspect pain may be contributing today.

## 2016-09-21 NOTE — Assessment & Plan Note (Signed)
7.3% risk , DM and HTN. Guidelines recommend moderate intensity statin.

## 2016-09-21 NOTE — Assessment & Plan Note (Signed)
Concern for renal stone. Pending CT scan. Negative pregnancy urine. UA shows trace ketones, bilirubin. Negative blood

## 2016-09-21 NOTE — Assessment & Plan Note (Signed)
Symptoms consistent with Candida. Nystatin is not working. Trial of clotrimazole. Return precautions given.

## 2016-09-21 NOTE — Progress Notes (Signed)
Pre visit review using our clinic review tool, if applicable. No additional management support is needed unless otherwise documented below in the visit note. 

## 2016-09-22 ENCOUNTER — Other Ambulatory Visit: Payer: Self-pay | Admitting: Family

## 2016-09-22 ENCOUNTER — Other Ambulatory Visit: Payer: Self-pay | Admitting: Specialist

## 2016-09-22 DIAGNOSIS — R1011 Right upper quadrant pain: Secondary | ICD-10-CM

## 2016-09-23 LAB — CBC WITH DIFFERENTIAL/PLATELET
Basophils Absolute: 0 10*3/uL (ref 0.0–0.2)
Basos: 0 %
EOS (ABSOLUTE): 0.1 10*3/uL (ref 0.0–0.4)
Eos: 3 %
Hematocrit: 39.5 % (ref 34.0–46.6)
Hemoglobin: 13.2 g/dL (ref 11.1–15.9)
Immature Grans (Abs): 0 10*3/uL (ref 0.0–0.1)
Immature Granulocytes: 0 %
Lymphocytes Absolute: 2.1 10*3/uL (ref 0.7–3.1)
Lymphs: 50 %
MCH: 28.7 pg (ref 26.6–33.0)
MCHC: 33.4 g/dL (ref 31.5–35.7)
MCV: 86 fL (ref 79–97)
Monocytes Absolute: 0.3 10*3/uL (ref 0.1–0.9)
Monocytes: 7 %
Neutrophils Absolute: 1.7 10*3/uL (ref 1.4–7.0)
Neutrophils: 40 %
Platelets: 176 10*3/uL (ref 150–379)
RBC: 4.6 x10E6/uL (ref 3.77–5.28)
RDW: 15 % (ref 12.3–15.4)
WBC: 4.3 10*3/uL (ref 3.4–10.8)

## 2016-09-23 LAB — COMPREHENSIVE METABOLIC PANEL
ALT: 86 IU/L — ABNORMAL HIGH (ref 0–32)
AST: 71 IU/L — ABNORMAL HIGH (ref 0–40)
Albumin/Globulin Ratio: 1.5 (ref 1.2–2.2)
Albumin: 4.2 g/dL (ref 3.5–5.5)
Alkaline Phosphatase: 75 IU/L (ref 39–117)
BUN/Creatinine Ratio: 10 (ref 9–23)
BUN: 7 mg/dL (ref 6–24)
Bilirubin Total: 0.7 mg/dL (ref 0.0–1.2)
CO2: 25 mmol/L (ref 18–29)
Calcium: 9.4 mg/dL (ref 8.7–10.2)
Chloride: 101 mmol/L (ref 96–106)
Creatinine, Ser: 0.73 mg/dL (ref 0.57–1.00)
GFR calc Af Amer: 111 mL/min/{1.73_m2} (ref >59)
GFR calc non Af Amer: 96 mL/min/{1.73_m2} (ref >59)
Globulin, Total: 2.8 g/dL (ref 1.5–4.5)
Glucose: 124 mg/dL — ABNORMAL HIGH (ref 65–99)
Potassium: 4 mmol/L (ref 3.5–5.2)
Sodium: 143 mmol/L (ref 134–144)
Total Protein: 7 g/dL (ref 6.0–8.5)

## 2016-09-23 LAB — LIPASE: Lipase: 19 U/L (ref 14–72)

## 2016-09-23 NOTE — Progress Notes (Signed)
Patient was informed.  Patient also wanted to know if she can have a referral to Urology due to having stones and as a bariatric patient she is prone to having them.  Patient will call back with BP readings.

## 2016-09-24 ENCOUNTER — Other Ambulatory Visit: Payer: Self-pay | Admitting: Nurse Practitioner

## 2016-09-24 ENCOUNTER — Other Ambulatory Visit: Payer: Self-pay | Admitting: Family

## 2016-09-24 DIAGNOSIS — N200 Calculus of kidney: Secondary | ICD-10-CM

## 2016-09-24 DIAGNOSIS — N63 Unspecified lump in unspecified breast: Secondary | ICD-10-CM

## 2016-09-24 DIAGNOSIS — R921 Mammographic calcification found on diagnostic imaging of breast: Secondary | ICD-10-CM

## 2016-09-25 ENCOUNTER — Telehealth: Payer: Self-pay | Admitting: Family

## 2016-09-25 NOTE — Telephone Encounter (Signed)
Called from cell phone. Discussed lab results of elevated AST, ALT in context of distended gallbladder. No acute abdominal pain right now.  Saw Dr Darnell Level, GI this week and has [place]  RUQ schedule in one day and follow up with him to discuss removing gallbladder. All questions answered.

## 2016-09-28 ENCOUNTER — Ambulatory Visit: Payer: 59 | Admitting: Urology

## 2016-09-28 ENCOUNTER — Ambulatory Visit
Admission: RE | Admit: 2016-09-28 | Discharge: 2016-09-28 | Disposition: A | Discharge status: Home / Self Care | Payer: 59 | Source: Ambulatory Visit | Attending: Specialist | Admitting: Specialist

## 2016-09-28 DIAGNOSIS — K760 Fatty (change of) liver, not elsewhere classified: Secondary | ICD-10-CM | POA: Insufficient documentation

## 2016-09-28 DIAGNOSIS — R1011 Right upper quadrant pain: Secondary | ICD-10-CM

## 2016-09-28 DIAGNOSIS — K828 Other specified diseases of gallbladder: Secondary | ICD-10-CM | POA: Insufficient documentation

## 2016-10-05 ENCOUNTER — Ambulatory Visit
Admission: RE | Admit: 2016-10-05 | Discharge: 2016-10-05 | Disposition: A | Discharge status: Home / Self Care | Payer: 59 | Source: Ambulatory Visit | Attending: Nurse Practitioner | Admitting: Nurse Practitioner

## 2016-10-05 DIAGNOSIS — R921 Mammographic calcification found on diagnostic imaging of breast: Secondary | ICD-10-CM

## 2016-10-06 ENCOUNTER — Encounter: Payer: Self-pay | Admitting: Urology

## 2016-10-06 ENCOUNTER — Ambulatory Visit (INDEPENDENT_AMBULATORY_CARE_PROVIDER_SITE_OTHER): Payer: 59 | Admitting: Urology

## 2016-10-06 VITALS — BP 150/88 | HR 69 | Ht 69.0 in | Wt 261.0 lb

## 2016-10-06 DIAGNOSIS — N200 Calculus of kidney: Secondary | ICD-10-CM | POA: Diagnosis not present

## 2016-10-06 DIAGNOSIS — R310 Gross hematuria: Secondary | ICD-10-CM | POA: Diagnosis not present

## 2016-10-06 DIAGNOSIS — R300 Dysuria: Secondary | ICD-10-CM | POA: Diagnosis not present

## 2016-10-06 DIAGNOSIS — G4730 Sleep apnea, unspecified: Secondary | ICD-10-CM | POA: Insufficient documentation

## 2016-10-06 LAB — URINALYSIS, COMPLETE
Bilirubin, UA: NEGATIVE
Glucose, UA: NEGATIVE
Leukocytes, UA: NEGATIVE
Nitrite, UA: NEGATIVE
Protein, UA: NEGATIVE
RBC, UA: NEGATIVE
Specific Gravity, UA: 1.025 (ref 1.005–1.030)
Urobilinogen, Ur: 0.2 mg/dL (ref 0.2–1.0)
pH, UA: 5.5 (ref 5.0–7.5)

## 2016-10-06 LAB — MICROSCOPIC EXAMINATION
Bacteria, UA: NONE SEEN
WBC, UA: NONE SEEN /hpf (ref 0–?)

## 2016-10-06 NOTE — Progress Notes (Signed)
10/06/2016 8:22 PM   [name] June 02, 1966 [id number]  Referring provider: [name], FNP [address] [place], [place] [address]  Chief Complaint  Patient presents with  . Nephrolithiasis    New Patient    HPI: 50 year old female status post recent gastric bypass who presents today with right flank pain and a history of kidney stones. She has a personal history of nephrolithiasis and has been experiencing right flank pain for several weeks intermittently.  As part of her workup, she underwent CT renal protocol CT scan on 09/21/2016 by her PCP which showed only a 2 mm punctate right lower pole stone, otherwise negative.  Shortly after the study, she did have an episode of pink-colored urine.  Incidentally on CT scan, she does have a distended gallbladder and will be undergoing cholecystectomy in the near future.  Chronically elevated LFTs from fatty liver diease, improving.   She works at  [employer] and frequently checks her urine, at least once per week. She notes that she has had some trace protein, blood both on dip and microscopic, and calcium oxalate crystals as well as bilirubin.  Last stone 2013, Dr. Yves Dill s/p stent and ESWL.  Lifetime history of stones x10-15, never more than once per year.    Remote social smoking in college.    She does complain of burning after urination especially since after gastric bypass. She also has some urgency and frequency. She does recall in having this prior to her surgery. She did have a Foley at the time of surgery.  She is perimenopausal.  Not sexually active, unaware of vaginal dryness.  No vaginal itching or discharge.    Since bypass surgery 8/17, she has lost ~50 lbs.  She is not able to drink a significant amount of water.  She has been drinking "alkinailized water" but not able to change the pH of her urine above 6.  She has been drinking lemonade.   PMH: Past Medical History:  Diagnosis Date  .  Arthritis   . Asthma   . Diabetes mellitus without complication ([place])   . Hyperlipidemia   . Hypertension   . Kidney stone   . Migraine    Has used imitrex  . Sleep apnea     Surgical History: Past Surgical History:  Procedure Laterality Date  . BARIATRIC SURGERY  2017  . CESAREAN SECTION    . TONSILLECTOMY AND ADENOIDECTOMY      Home Medications:    Medication List       Accurate as of 10/06/16  8:22 PM. Always use your most recent med list.          BARIATRIC FUSION Chew Chew by mouth.       Allergies:  Allergies  Allergen Reactions  . Other Shortness Of Breath    Reaction:  SOB, coughing  . Tolmetin Hives  . Levemir [Insulin Detemir]     Hives  . Nsaids Hives    Family History: Family History  Problem Relation Age of Onset  . Alcohol abuse Mother   . Hyperlipidemia Mother   . Hypertension Mother   . Hyperlipidemia Father   . Hypertension Father   . Parkinson's disease Father   . Drug abuse Sister   . Cancer Maternal Grandmother     breast cancer  . Breast cancer Maternal Grandmother   . Cancer Paternal Grandmother   . Kidney cancer Neg Hx   . Prostate cancer Neg Hx  Social History:  reports that she has never smoked. She has never used smokeless tobacco. She reports that she does not drink alcohol or use drugs.  ROS: UROLOGY Frequent Urination?: Yes Hard to postpone urination?: No Burning/pain with urination?: Yes Get up at night to urinate?: Yes Leakage of urine?: No Urine stream starts and stops?: Yes Trouble starting stream?: No Do you have to strain to urinate?: Yes Blood in urine?: Yes Urinary tract infection?: No Sexually transmitted disease?: No Injury to kidneys or bladder?: No Painful intercourse?: No Weak stream?: No Currently pregnant?: No Vaginal bleeding?: No Last menstrual period?: n  Gastrointestinal Nausea?: No Vomiting?: No Indigestion/heartburn?: No Diarrhea?: No Constipation?:  No  Constitutional Fever: No Night sweats?: No Weight loss?: No Fatigue?: No  Skin Skin rash/lesions?: No Itching?: No  Eyes Blurred vision?: No Double vision?: No  Ears/Nose/Throat Sore throat?: No Sinus problems?: No  Hematologic/Lymphatic Swollen glands?: No Easy bruising?: No  Cardiovascular Leg swelling?: No Chest pain?: No  Respiratory Cough?: No Shortness of breath?: No  Endocrine Excessive thirst?: No  Musculoskeletal Back pain?: No Joint pain?: No  Neurological Headaches?: No Dizziness?: No  Psychologic Depression?: No Anxiety?: No  Physical Exam: BP (!) 150/88   Pulse 69   Ht 5\' 9"  (1.753 m)   Wt 261 lb (118.4 kg)   BMI 38.54 kg/m   Constitutional:  Alert and oriented, No acute distress.   HEENT: [place] AT, moist mucus membranes.  Trachea midline, no masses. Cardiovascular: No clubbing, cyanosis, or edema. Respiratory: Normal respiratory effort, no increased work of breathing. GI: Abdomen is soft, nontender, nondistended, no abdominal masses.  Obese. GU: No CVA tenderness.  Skin: No rashes, bruises or suspicious lesions. Neurologic: Grossly intact, no focal deficits, moving all 4 extremities. Psychiatric: Normal mood and affect.  Laboratory Data: Lab Results  Component Value Date   WBC 4.3 09/22/2016   HGB 13.3 03/15/2016   HCT 39.5 09/22/2016   MCV 86 09/22/2016   PLT 176 09/22/2016    Lab Results  Component Value Date   CREATININE 0.73 09/22/2016    Lab Results  Component Value Date   HGBA1C 8.8 (H) 02/04/2016    Urinalysis Results for orders placed or performed in visit on 10/06/16  Microscopic Examination  Result Value Ref Range   WBC, UA None seen 0 - 5 /hpf   RBC, UA 0-2 0 - 2 /hpf   Epithelial Cells (non renal) 0-10 0 - 10 /hpf   Crystals Present (A) N/A   Crystal Type Calcium Oxalate N/A   Mucus, UA Present (A) Not Estab.   Bacteria, UA None seen None seen/Few  Urinalysis, Complete  Result Value Ref Range    Specific Gravity, UA 1.025 1.005 - 1.030   pH, UA 5.5 5.0 - 7.5   Color, UA Yellow Yellow   Appearance Ur Hazy (A) Clear   Leukocytes, UA Negative Negative   Protein, UA Negative Negative/Trace   Glucose, UA Negative Negative   Ketones, UA Trace (A) Negative   RBC, UA Negative Negative   Bilirubin, UA Negative Negative   Urobilinogen, Ur 0.2 0.2 - 1.0 mg/dL   Nitrite, UA Negative Negative   Microscopic Examination See below:     Pertinent Imaging: CLINICAL DATA:  Right flank pain.  History of kidney stones.  EXAM: CT ABDOMEN AND PELVIS WITHOUT CONTRAST  TECHNIQUE: Multidetector CT imaging of the abdomen and pelvis was performed following the standard protocol without IV contrast.  COMPARISON:  CT abdomen pelvis 02/07/2012  FINDINGS:  Lower chest: Lung bases clear.  Hepatobiliary: Normal liver. Distended gallbladder which has increased density compared to usual. No gallbladder wall thickening or pericholecystic edema. No calcified gallstones. Bile ducts nondilated  Pancreas: Negative  Spleen: Negative  Adrenals/Urinary Tract: 2 mm nonobstructive right renal calculus. No left renal calculi. No renal obstruction or mass. Phleboliths in the retroperitoneum on the right are outside of the ureter.  Stomach/Bowel: Gastric bypass. Negative for bowel obstruction. No bowel mass or edema.  Vascular/Lymphatic: Negative  Reproductive: Normal uterus.  No pelvic mass.  Other: Negative for free fluid  Musculoskeletal: No acute skeletal abnormality.  IMPRESSION: 2 mm right lower pole stone.  No renal obstruction.  Distended gallbladder. Increased density of bile in the gallbladder. No pericholecystic edema or gallbladder wall thickening. Correlate with physical examination this area. Consider gallbladder ultrasound to rule out cholecystitis.  These results will be called to the ordering clinician or representative by the Radiologist Assistant, and  communication documented in the PACS or zVision Dashboard.   Electronically Signed   By: Franchot Gallo M.D. On: 09/21/2016 16:52  CT personally reviewed  Assessment & Plan:    1. Nephrolithiasis 2 mm RLP stone, appears possibly imbedded in tissue.  Unlikely cause of right flank pain.   - Urinalysis, Complete  2. Recurrent nephrolithiasis We discussed general stone prevention techniques including drinking plenty water with goal of producing 2.5 L urine daily, increased citric acid intake, avoidance of high oxalate containing foods, and decreased salt intake. Specifically for her, more fluid intake is critical as tolerated.  Interested in 24 hour metabolic workup, will arrange for litholink.  3. Gross hematuria Etiology unclear, especially given lack of significant stone burden.  Recommend cystoscopy for further evaluation.    4. Dysuria Pain AFTER urination, suspect possible atrophy, may benefit from topical estrogen cream.   No evidence of UTI today. Will pursue pelvic exam at the time of cystoscopy.   Return in about 4 weeks (around 11/03/2016) for cystoscopy, pelvic exam, 24 hour results.  Hollice Espy, MD  [facility] [address], [place], [place] [address] [phone]

## 2016-10-15 HISTORY — PX: LAPAROSCOPIC CHOLECYSTECTOMY: SUR755

## 2016-10-19 ENCOUNTER — Other Ambulatory Visit: Payer: 59

## 2016-10-30 ENCOUNTER — Encounter: Payer: Self-pay | Admitting: Urology

## 2016-10-30 ENCOUNTER — Ambulatory Visit (INDEPENDENT_AMBULATORY_CARE_PROVIDER_SITE_OTHER): Payer: 59 | Admitting: Urology

## 2016-10-30 VITALS — BP 144/82 | HR 73 | Ht 68.0 in | Wt 247.0 lb

## 2016-10-30 DIAGNOSIS — R300 Dysuria: Secondary | ICD-10-CM

## 2016-10-30 DIAGNOSIS — R310 Gross hematuria: Secondary | ICD-10-CM

## 2016-10-30 DIAGNOSIS — N200 Calculus of kidney: Secondary | ICD-10-CM

## 2016-10-30 LAB — URINALYSIS, COMPLETE
Bilirubin, UA: NEGATIVE
Glucose, UA: NEGATIVE
Ketones, UA: NEGATIVE
Leukocytes, UA: NEGATIVE
Nitrite, UA: NEGATIVE
Protein, UA: NEGATIVE
RBC, UA: NEGATIVE
Specific Gravity, UA: >1.03 — ABNORMAL HIGH (ref 1.005–1.030)
Urobilinogen, Ur: 1 mg/dL (ref 0.2–1.0)
pH, UA: 5.5 (ref 5.0–7.5)

## 2016-10-30 LAB — MICROSCOPIC EXAMINATION: Epithelial Cells (non renal): >10 /hpf — AB (ref 0–10)

## 2016-10-30 MED ORDER — CIPROFLOXACIN HCL 500 MG PO TABS: 500.0000 mg | ORAL_TABLET | Freq: Once | ORAL | Status: DC

## 2016-10-30 MED ORDER — LIDOCAINE HCL 2 % EX GEL
1.0000 "application " | Freq: Once | CUTANEOUS | Status: AC
  Administered 2016-10-30: 1 via URETHRAL

## 2016-10-30 NOTE — Progress Notes (Signed)
   10/30/16  CC:  Chief Complaint  Patient presents with  . Cysto    HPI: 50 year old woman who presents today for cystoscopy for further evaluation of gross/ microscopic hematuria.  She also has a personal history of recurrent nephrolithiasis with a 24-hour urine pending as well as dysuria, primarily after urination.  She has had no further episodes of gross hematuria since her last visit. She does endorse today that she is starting to have some burning with urination during urination as well as after.  Blood pressure (!) 144/82, pulse 73, height 5\' 8"  (1.727 m), weight 247 lb (112 kg). NED. A&Ox3.   No respiratory distress   Abd soft, NT, ND Pelvic exam today with atrophic appearing mucosa, fairly capacious urethral meatus without hypermobility with Valsalva. Good vaginal vault support, no descent with Valsalva.  No vaginal discharge.   Cystoscopy Procedure Note  Patient identification was confirmed, informed consent was obtained, and patient was prepped using Betadine solution.  Lidocaine jelly was administered per urethral meatus.    Preoperative abx where received prior to procedure.    Procedure: - Flexible cystoscope introduced, without any difficulty.   - Thorough search of the bladder revealed:    normal urethral meatus     normal urothelium    no stones    no ulcers     no tumors    no urethral polyps    no trabeculation   Mild urinary sediment appreciated within the bladder.  - Ureteral orifices were normal in position and appearance.  Post-Procedure: - Patient tolerated the procedure well  Assessment/ Plan:  1. Recurrent nephrolithiasis She has performed a 123456 urine metabolic workup, results not yet available Plan for follow-up next month to review these results  2. Gross / microscopic hematuria Possibly related to nonobstructing stones or recently passed stones Cystoscopy today fairly unremarkable In the setting of dysuria and microscopic  hematuria, we'll send urine cytology today to rule out any other unrecognized, nonvisualized pathology  3. Dysuria Pain AFTER urination which may be related for atrophy appreciated today, may benefit from topical estrogen cream.   Sample of Premarin cream given today, advised to use small pea-sized amount per urethral meatus Monday, Wednesday, and Friday until next follow-up We'll reassess at that point  Return in about 1 month (around 11/30/2016) for f/u cytology, 24 hour urine, and urinary symptoms.   Hollice Espy, MD

## 2016-11-03 ENCOUNTER — Encounter: Payer: Self-pay | Admitting: Family

## 2016-11-03 DIAGNOSIS — R310 Gross hematuria: Secondary | ICD-10-CM | POA: Insufficient documentation

## 2016-11-05 ENCOUNTER — Other Ambulatory Visit: Payer: Self-pay | Admitting: Urology

## 2016-11-10 ENCOUNTER — Other Ambulatory Visit: Payer: Self-pay | Admitting: Urology

## 2016-11-11 ENCOUNTER — Encounter: Payer: Self-pay | Admitting: Family

## 2016-11-20 DIAGNOSIS — M791 Myalgia: Secondary | ICD-10-CM | POA: Diagnosis not present

## 2016-11-20 DIAGNOSIS — M9905 Segmental and somatic dysfunction of pelvic region: Secondary | ICD-10-CM | POA: Diagnosis not present

## 2016-11-20 DIAGNOSIS — M5430 Sciatica, unspecified side: Secondary | ICD-10-CM | POA: Diagnosis not present

## 2016-11-30 DIAGNOSIS — M5430 Sciatica, unspecified side: Secondary | ICD-10-CM | POA: Diagnosis not present

## 2016-11-30 DIAGNOSIS — M9905 Segmental and somatic dysfunction of pelvic region: Secondary | ICD-10-CM | POA: Diagnosis not present

## 2016-11-30 DIAGNOSIS — M791 Myalgia: Secondary | ICD-10-CM | POA: Diagnosis not present

## 2016-12-14 DIAGNOSIS — Z9884 Bariatric surgery status: Secondary | ICD-10-CM | POA: Diagnosis not present

## 2016-12-14 DIAGNOSIS — E119 Type 2 diabetes mellitus without complications: Secondary | ICD-10-CM | POA: Diagnosis not present

## 2016-12-21 DIAGNOSIS — M791 Myalgia: Secondary | ICD-10-CM | POA: Diagnosis not present

## 2016-12-21 DIAGNOSIS — M9905 Segmental and somatic dysfunction of pelvic region: Secondary | ICD-10-CM | POA: Diagnosis not present

## 2016-12-21 DIAGNOSIS — M5430 Sciatica, unspecified side: Secondary | ICD-10-CM | POA: Diagnosis not present

## 2016-12-23 ENCOUNTER — Ambulatory Visit: Payer: 59 | Admitting: Urology

## 2016-12-23 ENCOUNTER — Encounter: Payer: Self-pay | Admitting: Urology

## 2016-12-23 VITALS — BP 121/78 | HR 68 | Ht 68.0 in | Wt 223.0 lb

## 2016-12-23 DIAGNOSIS — R310 Gross hematuria: Secondary | ICD-10-CM

## 2016-12-23 DIAGNOSIS — N200 Calculus of kidney: Secondary | ICD-10-CM

## 2016-12-23 NOTE — Progress Notes (Signed)
12/23/2016 12:08 PM   Arville Go Loetta Rough 05-27-1966 [id number]  Referring provider: [name], FNP [address] [place], [place] [address]  Chief Complaint  Patient presents with  . Nephrolithiasis    1 month    HPI: 51 year old female status post recent gastric bypass with a history of recurrent nephrolithiasis and gross and microscopic hematuria.  She underwent cystoscopy on 10/2016 which was fairly unremarkable. Urine cytology was negative for dysplasia or malignancies.  CT renal protocol CT scan on 09/21/2016 by her PCP which showed only a 2 mm punctate right lower pole stone, otherwise negative.   Remote social smoking in college.    At last visit, she is complaining of burning with urination which has since completely resolved. She has no urinary complaints today.  She has a history of recurrent nephrolithiasis. She performed a 123456 urine metabolic workup with a little leak which showed a urinary volume of 1.23 L a day, urinary pH of 6.38, slightly elevated urinary oxalate and 56, normal urinary calcium of 180 mg per day, high urinary citrate.  Serum electrolytes were unremarkable.  Since bypass surgery 8/17, she has lost ~50 lbs.  She is not able to drink a significant amount of water.  She has been drinking "alkinailized water" but not able to change the pH of her urine above 6.  She has been drinking lemonade.   Last stone 2013, Dr. Yves Dill s/p stent and ESWL.  Lifetime history of stones x10-15, never more than once per year.     PMH: Past Medical History:  Diagnosis Date  . Arthritis   . Asthma   . Diabetes mellitus without complication ([place])   . Hyperlipidemia   . Hypertension   . Kidney stone   . Migraine    Has used imitrex  . Sleep apnea     Surgical History: Past Surgical History:  Procedure Laterality Date  . BARIATRIC SURGERY  2017  . CESAREAN SECTION    . TONSILLECTOMY AND ADENOIDECTOMY      Home Medications:    Allergies as of 12/23/2016      Reactions   Other Shortness Of Breath   Reaction:  SOB, coughing   Tolmetin Hives   Levemir [insulin Detemir]    Hives   Nsaids Hives      Medication List       Accurate as of 12/23/16 12:08 PM. Always use your most recent med list.          BARIATRIC FUSION Chew Chew by mouth.       Allergies:  Allergies  Allergen Reactions  . Other Shortness Of Breath    Reaction:  SOB, coughing  . Tolmetin Hives  . Levemir [Insulin Detemir]     Hives  . Nsaids Hives    Family History: Family History  Problem Relation Age of Onset  . Alcohol abuse Mother   . Hyperlipidemia Mother   . Hypertension Mother   . Hyperlipidemia Father   . Hypertension Father   . Parkinson's disease Father   . Drug abuse Sister   . Cancer Maternal Grandmother     breast cancer  . Breast cancer Maternal Grandmother   . Cancer Paternal Grandmother   . Kidney cancer Neg Hx   . Prostate cancer Neg Hx     Social History:  reports that she has never smoked. She has never used smokeless tobacco. She reports that she does not drink alcohol or use drugs.  ROS: UROLOGY Frequent Urination?:  No Hard to postpone urination?: No Burning/pain with urination?: No Get up at night to urinate?: No Leakage of urine?: No Urine stream starts and stops?: No Trouble starting stream?: No Do you have to strain to urinate?: No Blood in urine?: No Urinary tract infection?: No Sexually transmitted disease?: No Injury to kidneys or bladder?: No Painful intercourse?: No Weak stream?: No Currently pregnant?: No Vaginal bleeding?: No Last menstrual period?: n  Gastrointestinal Nausea?: No Vomiting?: No Indigestion/heartburn?: No Diarrhea?: No Constipation?: No  Constitutional Fever: No Night sweats?: No Weight loss?: No Fatigue?: No  Skin Skin rash/lesions?: No Itching?: No  Eyes Blurred vision?: No Double vision?: No  Ears/Nose/Throat Sore throat?: No Sinus  problems?: No  Hematologic/Lymphatic Swollen glands?: No Easy bruising?: No  Cardiovascular Leg swelling?: No Chest pain?: No  Respiratory Cough?: No Shortness of breath?: No  Endocrine Excessive thirst?: No  Musculoskeletal Back pain?: No Joint pain?: No  Neurological Headaches?: No Dizziness?: No  Psychologic Depression?: No Anxiety?: No  Physical Exam: BP 121/78   Pulse 68   Ht 5\' 8"  (1.727 m)   Wt 223 lb (101.2 kg)   BMI 33.91 kg/m   Constitutional:  Alert and oriented, No acute distress.   HEENT: [place] AT, moist mucus membranes.  Trachea midline, no masses. Cardiovascular: No clubbing, cyanosis, or edema. Respiratory: Normal respiratory effort, no increased work of breathing. GI: Abdomen is soft, nontender, nondistended, no abdominal masses.  Obese. GU: No CVA tenderness.  Skin: No rashes, bruises or suspicious lesions. Neurologic: Grossly intact, no focal deficits, moving all 4 extremities. Psychiatric: Normal mood and affect.  Laboratory Data: Lab Results  Component Value Date   WBC 4.3 09/22/2016   HGB 13.3 03/15/2016   HCT 39.5 09/22/2016   MCV 86 09/22/2016   PLT 176 09/22/2016    Lab Results  Component Value Date   CREATININE 0.73 09/22/2016    Lab Results  Component Value Date   HGBA1C 8.8 (H) 02/04/2016    Urinalysis Results for orders placed or performed in visit on 10/30/16  Microscopic Examination  Result Value Ref Range   WBC, UA 0-5 0 - 5 /hpf   RBC, UA 0-2 0 - 2 /hpf   Epithelial Cells (non renal) >10 (A) 0 - 10 /hpf   Mucus, UA Present (A) Not Estab.   Bacteria, UA Few None seen/Few  Urinalysis, Complete  Result Value Ref Range   Specific Gravity, UA >1.030 (H) 1.005 - 1.030   pH, UA 5.5 5.0 - 7.5   Color, UA Yellow Yellow   Appearance Ur Hazy (A) Clear   Leukocytes, UA Negative Negative   Protein, UA Negative Negative/Trace   Glucose, UA Negative Negative   Ketones, UA Negative Negative   RBC, UA Negative Negative     Bilirubin, UA Negative Negative   Urobilinogen, Ur 1.0 0.2 - 1.0 mg/dL   Nitrite, UA Negative Negative   Microscopic Examination See below:     Pertinent Imaging: No new imaging  Assessment & Plan:    1. Nephrolithiasis 2 mm RLP stone, appears possibly imbedded in tissue.   No intervention recommended  Return if develops flank pain or any other worrisome symptoms  2. Recurrent nephrolithiasis Litholink results reviewed today with the patient- Primary interventions recommended including increasing urinary volume to 2.5 L as well as significantly reducing oxalate intake I explained the pathophysiology about increased oxylate absorption status post gastric bypass Info including the book "ABC" of stones provided today  3. Gross hematuria/ microscopic hematuria  S/p work up including noncontrast CT abd/ pelvis and cysto 2017 Cytology negative No delayed or imaging to date, but given relatively low risk of upper tract TCC (nonsmoker), patient elected and otherwise negative work up, patient declined retrogrades/ CT urogram   F/u prn  Hollice Espy, MD  [facility] [address], [place],  [address] [phone]

## 2017-01-04 DIAGNOSIS — M5430 Sciatica, unspecified side: Secondary | ICD-10-CM | POA: Diagnosis not present

## 2017-01-04 DIAGNOSIS — M9905 Segmental and somatic dysfunction of pelvic region: Secondary | ICD-10-CM | POA: Diagnosis not present

## 2017-01-04 DIAGNOSIS — M791 Myalgia: Secondary | ICD-10-CM | POA: Diagnosis not present

## 2017-01-05 DIAGNOSIS — Z9884 Bariatric surgery status: Secondary | ICD-10-CM | POA: Diagnosis not present

## 2017-01-05 DIAGNOSIS — R5383 Other fatigue: Secondary | ICD-10-CM | POA: Diagnosis not present

## 2017-01-18 DIAGNOSIS — M791 Myalgia: Secondary | ICD-10-CM | POA: Diagnosis not present

## 2017-01-18 DIAGNOSIS — M9905 Segmental and somatic dysfunction of pelvic region: Secondary | ICD-10-CM | POA: Diagnosis not present

## 2017-01-18 DIAGNOSIS — M5430 Sciatica, unspecified side: Secondary | ICD-10-CM | POA: Diagnosis not present

## 2017-01-25 DIAGNOSIS — M9905 Segmental and somatic dysfunction of pelvic region: Secondary | ICD-10-CM | POA: Diagnosis not present

## 2017-01-25 DIAGNOSIS — M5430 Sciatica, unspecified side: Secondary | ICD-10-CM | POA: Diagnosis not present

## 2017-01-25 DIAGNOSIS — M791 Myalgia: Secondary | ICD-10-CM | POA: Diagnosis not present

## 2017-02-01 DIAGNOSIS — M791 Myalgia: Secondary | ICD-10-CM | POA: Diagnosis not present

## 2017-02-01 DIAGNOSIS — M5430 Sciatica, unspecified side: Secondary | ICD-10-CM | POA: Diagnosis not present

## 2017-02-01 DIAGNOSIS — M9905 Segmental and somatic dysfunction of pelvic region: Secondary | ICD-10-CM | POA: Diagnosis not present

## 2017-02-15 DIAGNOSIS — M5430 Sciatica, unspecified side: Secondary | ICD-10-CM | POA: Diagnosis not present

## 2017-02-15 DIAGNOSIS — M791 Myalgia: Secondary | ICD-10-CM | POA: Diagnosis not present

## 2017-02-15 DIAGNOSIS — M9905 Segmental and somatic dysfunction of pelvic region: Secondary | ICD-10-CM | POA: Diagnosis not present

## 2017-02-23 DIAGNOSIS — G4733 Obstructive sleep apnea (adult) (pediatric): Secondary | ICD-10-CM | POA: Diagnosis not present

## 2017-02-23 DIAGNOSIS — Z9884 Bariatric surgery status: Secondary | ICD-10-CM | POA: Diagnosis not present

## 2017-02-23 DIAGNOSIS — K912 Postsurgical malabsorption, not elsewhere classified: Secondary | ICD-10-CM | POA: Diagnosis not present

## 2017-02-26 DIAGNOSIS — Z9884 Bariatric surgery status: Secondary | ICD-10-CM | POA: Diagnosis not present

## 2017-03-01 DIAGNOSIS — M5430 Sciatica, unspecified side: Secondary | ICD-10-CM | POA: Diagnosis not present

## 2017-03-01 DIAGNOSIS — M791 Myalgia: Secondary | ICD-10-CM | POA: Diagnosis not present

## 2017-03-01 DIAGNOSIS — M9905 Segmental and somatic dysfunction of pelvic region: Secondary | ICD-10-CM | POA: Diagnosis not present

## 2017-03-04 DIAGNOSIS — G4733 Obstructive sleep apnea (adult) (pediatric): Secondary | ICD-10-CM | POA: Diagnosis not present

## 2017-03-08 DIAGNOSIS — M9905 Segmental and somatic dysfunction of pelvic region: Secondary | ICD-10-CM | POA: Diagnosis not present

## 2017-03-08 DIAGNOSIS — Z9889 Other specified postprocedural states: Secondary | ICD-10-CM | POA: Diagnosis not present

## 2017-03-08 DIAGNOSIS — M5430 Sciatica, unspecified side: Secondary | ICD-10-CM | POA: Diagnosis not present

## 2017-03-08 DIAGNOSIS — M791 Myalgia: Secondary | ICD-10-CM | POA: Diagnosis not present

## 2017-03-08 DIAGNOSIS — R5383 Other fatigue: Secondary | ICD-10-CM | POA: Diagnosis not present

## 2017-03-08 DIAGNOSIS — R239 Unspecified skin changes: Secondary | ICD-10-CM | POA: Diagnosis not present

## 2017-03-15 DIAGNOSIS — Z8639 Personal history of other endocrine, nutritional and metabolic disease: Secondary | ICD-10-CM | POA: Diagnosis not present

## 2017-03-15 DIAGNOSIS — M5430 Sciatica, unspecified side: Secondary | ICD-10-CM | POA: Diagnosis not present

## 2017-03-15 DIAGNOSIS — Z9884 Bariatric surgery status: Secondary | ICD-10-CM | POA: Diagnosis not present

## 2017-03-15 DIAGNOSIS — M791 Myalgia: Secondary | ICD-10-CM | POA: Diagnosis not present

## 2017-03-15 DIAGNOSIS — M9905 Segmental and somatic dysfunction of pelvic region: Secondary | ICD-10-CM | POA: Diagnosis not present

## 2017-03-22 DIAGNOSIS — M791 Myalgia: Secondary | ICD-10-CM | POA: Diagnosis not present

## 2017-03-22 DIAGNOSIS — M5430 Sciatica, unspecified side: Secondary | ICD-10-CM | POA: Diagnosis not present

## 2017-03-22 DIAGNOSIS — M9905 Segmental and somatic dysfunction of pelvic region: Secondary | ICD-10-CM | POA: Diagnosis not present

## 2017-03-26 DIAGNOSIS — M791 Myalgia: Secondary | ICD-10-CM | POA: Diagnosis not present

## 2017-03-26 DIAGNOSIS — M5430 Sciatica, unspecified side: Secondary | ICD-10-CM | POA: Diagnosis not present

## 2017-03-26 DIAGNOSIS — M9905 Segmental and somatic dysfunction of pelvic region: Secondary | ICD-10-CM | POA: Diagnosis not present

## 2017-04-02 DIAGNOSIS — M9905 Segmental and somatic dysfunction of pelvic region: Secondary | ICD-10-CM | POA: Diagnosis not present

## 2017-04-02 DIAGNOSIS — M791 Myalgia: Secondary | ICD-10-CM | POA: Diagnosis not present

## 2017-04-02 DIAGNOSIS — M5430 Sciatica, unspecified side: Secondary | ICD-10-CM | POA: Diagnosis not present

## 2017-04-09 DIAGNOSIS — M791 Myalgia: Secondary | ICD-10-CM | POA: Diagnosis not present

## 2017-04-09 DIAGNOSIS — M9905 Segmental and somatic dysfunction of pelvic region: Secondary | ICD-10-CM | POA: Diagnosis not present

## 2017-04-09 DIAGNOSIS — M5430 Sciatica, unspecified side: Secondary | ICD-10-CM | POA: Diagnosis not present

## 2017-04-16 ENCOUNTER — Other Ambulatory Visit: Payer: Self-pay | Admitting: Nurse Practitioner

## 2017-04-16 ENCOUNTER — Encounter: Payer: Self-pay | Admitting: Family

## 2017-04-16 DIAGNOSIS — M791 Myalgia: Secondary | ICD-10-CM | POA: Diagnosis not present

## 2017-04-16 DIAGNOSIS — M5430 Sciatica, unspecified side: Secondary | ICD-10-CM | POA: Diagnosis not present

## 2017-04-16 DIAGNOSIS — M9905 Segmental and somatic dysfunction of pelvic region: Secondary | ICD-10-CM | POA: Diagnosis not present

## 2017-04-19 ENCOUNTER — Other Ambulatory Visit: Payer: Self-pay | Admitting: Family

## 2017-04-19 DIAGNOSIS — Z794 Long term (current) use of insulin: Principal | ICD-10-CM

## 2017-04-19 DIAGNOSIS — E1165 Type 2 diabetes mellitus with hyperglycemia: Secondary | ICD-10-CM

## 2017-04-19 DIAGNOSIS — Z1239 Encounter for other screening for malignant neoplasm of breast: Secondary | ICD-10-CM

## 2017-04-19 MED ORDER — GLUCOSE BLOOD VI STRP: ORAL_STRIP | 4 refills | Status: DC

## 2017-04-26 DIAGNOSIS — M9905 Segmental and somatic dysfunction of pelvic region: Secondary | ICD-10-CM | POA: Diagnosis not present

## 2017-04-26 DIAGNOSIS — M5430 Sciatica, unspecified side: Secondary | ICD-10-CM | POA: Diagnosis not present

## 2017-04-26 DIAGNOSIS — M791 Myalgia: Secondary | ICD-10-CM | POA: Diagnosis not present

## 2017-05-03 DIAGNOSIS — M9905 Segmental and somatic dysfunction of pelvic region: Secondary | ICD-10-CM | POA: Diagnosis not present

## 2017-05-03 DIAGNOSIS — M791 Myalgia: Secondary | ICD-10-CM | POA: Diagnosis not present

## 2017-05-03 DIAGNOSIS — M5430 Sciatica, unspecified side: Secondary | ICD-10-CM | POA: Diagnosis not present

## 2017-05-10 DIAGNOSIS — M5430 Sciatica, unspecified side: Secondary | ICD-10-CM | POA: Diagnosis not present

## 2017-05-10 DIAGNOSIS — M9905 Segmental and somatic dysfunction of pelvic region: Secondary | ICD-10-CM | POA: Diagnosis not present

## 2017-05-10 DIAGNOSIS — M791 Myalgia: Secondary | ICD-10-CM | POA: Diagnosis not present

## 2017-05-24 DIAGNOSIS — M791 Myalgia: Secondary | ICD-10-CM | POA: Diagnosis not present

## 2017-05-24 DIAGNOSIS — M9905 Segmental and somatic dysfunction of pelvic region: Secondary | ICD-10-CM | POA: Diagnosis not present

## 2017-05-24 DIAGNOSIS — M5430 Sciatica, unspecified side: Secondary | ICD-10-CM | POA: Diagnosis not present

## 2017-05-31 ENCOUNTER — Ambulatory Visit
Admission: RE | Admit: 2017-05-31 | Discharge: 2017-05-31 | Disposition: A | Discharge status: Home / Self Care | Payer: 59 | Source: Ambulatory Visit | Attending: Family | Admitting: Family

## 2017-05-31 DIAGNOSIS — Z1231 Encounter for screening mammogram for malignant neoplasm of breast: Secondary | ICD-10-CM | POA: Insufficient documentation

## 2017-05-31 DIAGNOSIS — Z1239 Encounter for other screening for malignant neoplasm of breast: Secondary | ICD-10-CM

## 2017-06-07 DIAGNOSIS — M9905 Segmental and somatic dysfunction of pelvic region: Secondary | ICD-10-CM | POA: Diagnosis not present

## 2017-06-07 DIAGNOSIS — M791 Myalgia: Secondary | ICD-10-CM | POA: Diagnosis not present

## 2017-06-07 DIAGNOSIS — M5430 Sciatica, unspecified side: Secondary | ICD-10-CM | POA: Diagnosis not present

## 2017-06-08 DIAGNOSIS — Z9884 Bariatric surgery status: Secondary | ICD-10-CM | POA: Diagnosis not present

## 2017-06-08 DIAGNOSIS — Z5181 Encounter for therapeutic drug level monitoring: Secondary | ICD-10-CM | POA: Diagnosis not present

## 2017-06-08 DIAGNOSIS — K912 Postsurgical malabsorption, not elsewhere classified: Secondary | ICD-10-CM | POA: Diagnosis not present

## 2017-06-17 ENCOUNTER — Encounter: Payer: Self-pay | Admitting: Family

## 2017-06-21 DIAGNOSIS — M5430 Sciatica, unspecified side: Secondary | ICD-10-CM | POA: Diagnosis not present

## 2017-06-21 DIAGNOSIS — M9905 Segmental and somatic dysfunction of pelvic region: Secondary | ICD-10-CM | POA: Diagnosis not present

## 2017-06-21 DIAGNOSIS — M791 Myalgia: Secondary | ICD-10-CM | POA: Diagnosis not present

## 2017-06-22 ENCOUNTER — Ambulatory Visit (INDEPENDENT_AMBULATORY_CARE_PROVIDER_SITE_OTHER): Payer: 59 | Admitting: Family

## 2017-06-22 ENCOUNTER — Encounter: Payer: Self-pay | Admitting: Family

## 2017-06-22 VITALS — BP 150/86 | HR 62 | Temp 98.0°F | Ht 68.0 in | Wt 189.2 lb

## 2017-06-22 DIAGNOSIS — F339 Major depressive disorder, recurrent, unspecified: Secondary | ICD-10-CM | POA: Diagnosis not present

## 2017-06-22 DIAGNOSIS — B372 Candidiasis of skin and nail: Secondary | ICD-10-CM | POA: Diagnosis not present

## 2017-06-22 DIAGNOSIS — I10 Essential (primary) hypertension: Secondary | ICD-10-CM

## 2017-06-22 MED ORDER — FLUOXETINE HCL 20 MG PO CAPS: 20.0000 mg | ORAL_CAPSULE | Freq: Every morning | ORAL | 3 refills | Status: DC

## 2017-06-22 MED ORDER — LISINOPRIL 5 MG PO TABS: 5.0000 mg | ORAL_TABLET | Freq: Every day | ORAL | 3 refills | Status: DC

## 2017-06-22 NOTE — Progress Notes (Signed)
Pre visit review using our clinic review tool, if applicable. No additional management support is needed unless otherwise documented below in the visit note. 

## 2017-06-22 NOTE — Assessment & Plan Note (Signed)
Chronic. Unchanged. Declines any anti fungal at this time. Return precautions given.

## 2017-06-22 NOTE — Assessment & Plan Note (Signed)
Increased. Start lisinopril. Repeat bmp 5 days. Will follow.

## 2017-06-22 NOTE — Progress Notes (Signed)
Subjective:    Patient ID: [name], female    DOB: 1965/11/28, 51 y.o.   MRN: [id number]  CC: [name] is a 51 y.o. female who presents today for follow up.   HPI: CC: 'anger issues.'  Notes always 'flew off the handle' but since bastric bypass, worsening. Hitting walls.   Doesn't want to hurt herself or anyone else. More directed at 'things.'   Lawmover wasn't working, and started hitting head on steering wheel.   A lot of stress at work, Counselling psychologist.  Sleeping well. Feels more tearful. Tried prozac, effexor, zoloft over the years however not sure how it worked.   Started working out at gym and has a Physiological scientist.   Complains of skin infection in umbilicus and in groin. Chronic Uses clorax with relief. No resolve with nystatin.   Would like documented for insurance.   Elevated blood pressure . Had been on lisinopril in the past. Denies exertional chest pain or pressure, numbness or tingling radiating to left arm or jaw, palpitations, dizziness, frequent headaches, changes in vision, or shortness of breath.        HISTORY:  Past Medical History:  Diagnosis Date  . Arthritis   . Asthma   . Diabetes mellitus without complication ([place])   . Hyperlipidemia   . Hypertension   . Kidney stone   . Migraine    Has used imitrex  . Sleep apnea    Past Surgical History:  Procedure Laterality Date  . BARIATRIC SURGERY  2017  . BREAST BIOPSY Right 2017   FIBROCYSTIC CHANGES WITH CALCIFICATIONS  . CESAREAN SECTION    . TONSILLECTOMY AND ADENOIDECTOMY     Family History  Problem Relation Age of Onset  . Alcohol abuse Mother   . Hyperlipidemia Mother   . Hypertension Mother   . Hyperlipidemia Father   . Hypertension Father   . Parkinson's disease Father   . Drug abuse Sister   . Cancer Maternal Grandmother        breast cancer  . Breast cancer Maternal Grandmother   . Cancer Paternal Grandmother   . Kidney cancer Neg Hx   .  Prostate cancer Neg Hx     Allergies: Other; Tolmetin; Levemir [insulin detemir]; and Nsaids Current Outpatient Prescriptions on File Prior to Visit  Medication Sig Dispense Refill  . glucose blood (ONE TOUCH ULTRA TEST) test strip CHECK SUGAR 4 TIMES DAILY 100 each 4  . Multiple Vitamins-Minerals (BARIATRIC FUSION) CHEW Chew by mouth.     No current facility-administered medications on file prior to visit.     Social History  Substance Use Topics  . Smoking status: Never Smoker  . Smokeless tobacco: Never Used  . Alcohol use No    Review of Systems  Constitutional: Negative for chills and fever.  Eyes: Negative for visual disturbance.  Respiratory: Negative for cough.   Cardiovascular: Negative for chest pain and palpitations.  Gastrointestinal: Negative for nausea and vomiting.  Skin: Positive for rash.  Neurological: Negative for headaches.      Objective:    BP (!) 150/86   Pulse 62   Temp 98 F (36.7 C) (Oral)   Ht 5\' 8"  (1.727 m)   Wt 189 lb 3.2 oz (85.8 kg)   SpO2 98%   BMI 28.77 kg/m  BP Readings from Last 3 Encounters:  06/22/17 (!) 150/86  12/23/16 121/78  10/30/16 (!) 144/82   Wt Readings from Last 3 Encounters:  06/22/17 189 lb  3.2 oz (85.8 kg)  12/23/16 223 lb (101.2 kg)  10/30/16 247 lb (112 kg)    Physical Exam  Constitutional: She appears well-developed and well-nourished.  Eyes: Conjunctivae are normal.  Cardiovascular: Normal rate, regular rhythm, normal heart sounds and normal pulses.   Pulmonary/Chest: Effort normal and breath sounds normal. She has no wheezes. She has no rhonchi. She has no rales.  Neurological: She is alert.  Skin: Skin is warm and dry. There is erythema.  Erythematous area noted around umbilicus. No drainage, increased warmth. See picture.   Psychiatric: She has a normal mood and affect. Her speech is normal and behavior is normal. Thought content normal.  Vitals reviewed.         Assessment & Plan:   Problem  List Items Addressed This Visit      Cardiovascular and Mediastinum   HTN (hypertension)    Increased. Start lisinopril. Repeat bmp 5 days. Will follow.       Relevant Medications   lisinopril (PRINIVIL,ZESTRIL) 5 MG tablet   Other Relevant Orders   Basic metabolic panel     Musculoskeletal and Integument   Candidal intertrigo    Chronic. Unchanged. Declines any anti fungal at this time. Return precautions given.        Other   Depression, recurrent ([name]) - Primary    Worsening. Start prozac. Referral to counseling for anger management.       Relevant Medications   FLUoxetine (PROZAC) 20 MG capsule   Other Relevant Orders   Ambulatory referral to Psychology       I am having [name] start on FLUoxetine and lisinopril. I am also having her maintain her BARIATRIC FUSION and glucose blood.   Meds ordered this encounter  Medications  . FLUoxetine (PROZAC) 20 MG capsule    Sig: Take 1 capsule (20 mg total) by mouth every morning.    Dispense:  90 capsule    Refill:  3    Order Specific Question:   Supervising Provider    Answer:   [name] [[id number]]  . lisinopril (PRINIVIL,ZESTRIL) 5 MG tablet    Sig: Take 1 tablet (5 mg total) by mouth daily.    Dispense:  90 tablet    Refill:  3    Order Specific Question:   Supervising Provider    Answer:   [name] [[id number]]    Return precautions given.   Risks, benefits, and alternatives of the medications and treatment plan prescribed today were discussed, and patient expressed understanding.   Education regarding symptom management and diagnosis given to patient on AVS.  Continue to follow with Sarah Hawthorne, Stout for routine health maintenance.   [name] and I agreed with plan.   [name]

## 2017-06-22 NOTE — Assessment & Plan Note (Signed)
Worsening. Start prozac. Referral to counseling for anger management.

## 2017-06-22 NOTE — Patient Instructions (Signed)
Start prozac 20mg  ; may need to increase to 40mg  once per day in a week  Lisinopril 5mg  ; goal of BP < 130/80  BMP recheck in 5 days at [place]  Follow up in 2 months

## 2017-07-02 ENCOUNTER — Ambulatory Visit (INDEPENDENT_AMBULATORY_CARE_PROVIDER_SITE_OTHER): Payer: 59 | Admitting: Family Medicine

## 2017-07-02 ENCOUNTER — Encounter: Payer: Self-pay | Admitting: Family Medicine

## 2017-07-02 ENCOUNTER — Ambulatory Visit (INDEPENDENT_AMBULATORY_CARE_PROVIDER_SITE_OTHER): Payer: 59

## 2017-07-02 VITALS — BP 140/72 | HR 52 | Temp 98.0°F | Resp 16 | Wt 191.2 lb

## 2017-07-02 DIAGNOSIS — R310 Gross hematuria: Secondary | ICD-10-CM

## 2017-07-02 LAB — POC URINALSYSI DIPSTICK (AUTOMATED)
Bilirubin, UA: NEGATIVE
Glucose, UA: NEGATIVE
Ketones, UA: NEGATIVE
Leukocytes, UA: NEGATIVE
Nitrite, UA: NEGATIVE
Spec Grav, UA: >=1.03 — AB (ref 1.010–1.025)
Urobilinogen, UA: NEGATIVE E.U./dL — AB
pH, UA: 6 (ref 5.0–8.0)

## 2017-07-02 MED ORDER — CIPROFLOXACIN HCL 500 MG PO TABS: 500.0000 mg | ORAL_TABLET | Freq: Two times a day (BID) | ORAL | 0 refills | Status: DC

## 2017-07-02 MED ORDER — TAMSULOSIN HCL 0.4 MG PO CAPS: 0.4000 mg | ORAL_CAPSULE | Freq: Every day | ORAL | 0 refills | Status: DC

## 2017-07-02 NOTE — Patient Instructions (Signed)
Medication as prescribed.  Take care  Dr. Alania Overholt  

## 2017-07-02 NOTE — Assessment & Plan Note (Addendum)
Patient has a history of hematuria. Now with gross hematuria today. Associated flank pain, dysuria, mild urgency. No pyuria. Positive CVA tenderness. Sending urine for culture. I gave the patient had about a prescription but she wants to wait at this time. I suspect nephrolithiasis. Treating with Flomax. KUB today. Patient declined pain medication.

## 2017-07-02 NOTE — Progress Notes (Signed)
Subjective:  Patient ID: [name], female    DOB: 09/18/1966  Age: 51 y.o. MRN: [id number]  CC: Hematuria, Pain with urination, back pain, flank pain  HPI:  51 year old female with a history of gross hematuria and nephrolithiasis presents with the above complaints.  Patient reports that on Wednesday she noted hematuria. She confirmed this at work with a urine sample. She reports that she's also had associated dysuria. Mild urgency. No reports of fevers or chills. She does report associated right back pain/flank pain as well. No other associated symptoms. Pain is moderate in severity. No known exacerbating or relieving factors. No other associated symptoms. No other complaints at this time.  Social Hx   Social History   Social History  . Marital status: Single    Spouse name: N/A  . Number of children: 2  . Years of education: 14   Occupational History  . 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 Teaches microbiology   . [employer] - Weekend   Social History Main Topics  . Smoking status: Never Smoker  . Smokeless tobacco: Never Used  . Alcohol use No  . Drug use: No  . Sexual activity: Not Asked   Other Topics Concern  . None   Social History Narrative   [name] grew up partly in [place] and then [place]. She lives in [place] with her two daughter. 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 She enjoys the outdoors, gardening.     Review of Systems  Constitutional: Negative for fever.  Genitourinary: Positive for dysuria, flank pain, hematuria and urgency.   Objective:  BP 140/72 (BP Location: Left Arm, Patient Position: Sitting, Cuff Size: Normal)   Pulse (!) 52   Temp 98 F (36.7 C) (Oral)   Resp 16   Wt 191 lb 4 oz (86.8 kg)   SpO2 96%   BMI 29.08 kg/m   BP/Weight 07/02/2017 06/22/2017 1/74/9449  Systolic BP 675 916 384  Diastolic BP 72 86 78  Wt. (Lbs) 191.25  189.2 223  BMI 29.08 28.77 33.91   Physical Exam  Constitutional: She is oriented to person, place, and time. She appears well-developed. No distress.  Cardiovascular: Normal rate and regular rhythm.   No murmur heard. Pulmonary/Chest: Effort normal. She has no wheezes. She has no rales.  Abdominal: Soft. She exhibits no distension. There is no tenderness. There is no rebound and no guarding.  CVA tenderness on the right.  Neurological: She is alert and oriented to person, place, and time.  Psychiatric: She has a normal mood and affect.  Vitals reviewed.   Lab Results  Component Value Date   WBC 4.3 09/22/2016   HGB 13.2 09/22/2016   HCT 39.5 09/22/2016   PLT 176 09/22/2016   GLUCOSE 124 (H) 09/22/2016   CHOL 237 (H) 02/04/2016   TRIG 200 (H) 02/04/2016   HDL 34 (L) 02/04/2016   LDLDIRECT 149.3 09/03/2014   LDLCALC 163 (H) 02/04/2016   ALT 86 (H) 09/22/2016   AST 71 (H) 09/22/2016   NA 143 09/22/2016   K 4.0 09/22/2016   CL 101 09/22/2016   CREATININE 0.73 09/22/2016   BUN 7 09/22/2016   CO2 25 09/22/2016   TSH 2.83 12/30/2015   INR 1.0 12/30/2015   HGBA1C 8.8 (H) 02/04/2016   MICROALBUR 5.5 (H) 09/10/2014    Assessment & Plan:   Problem  List Items Addressed This Visit    Gross hematuria - Primary    Patient has a history of hematuria. Now with gross hematuria today. Associated flank pain, dysuria, mild urgency. No pyuria. Positive CVA tenderness. Sending urine for culture. I gave the patient had about a prescription but she wants to wait at this time. I suspect nephrolithiasis. Treating with Flomax. KUB today. Patient declined pain medication.      Relevant Orders   POCT Urinalysis Dipstick (Automated) (Completed)   DG Abd 1 View   Urine Culture      Meds ordered this encounter  Medications  . vitamin A 25000 UNIT capsule    Sig: Take 25,000 Units by mouth daily.  . tamsulosin (FLOMAX) 0.4 MG CAPS capsule    Sig: Take 1 capsule (0.4 mg total) by mouth  daily.    Dispense:  30 capsule    Refill:  0  . ciprofloxacin (CIPRO) 500 MG tablet    Sig: Take 1 tablet (500 mg total) by mouth 2 (two) times daily.    Dispense:  14 tablet    Refill:  0     Follow-up: PRN  [place]

## 2017-07-03 LAB — URINE CULTURE: Organism ID, Bacteria: NO GROWTH

## 2017-07-05 DIAGNOSIS — M5430 Sciatica, unspecified side: Secondary | ICD-10-CM | POA: Diagnosis not present

## 2017-07-05 DIAGNOSIS — M791 Myalgia: Secondary | ICD-10-CM | POA: Diagnosis not present

## 2017-07-05 DIAGNOSIS — M9905 Segmental and somatic dysfunction of pelvic region: Secondary | ICD-10-CM | POA: Diagnosis not present

## 2017-07-06 DIAGNOSIS — I10 Essential (primary) hypertension: Secondary | ICD-10-CM | POA: Diagnosis not present

## 2017-07-07 ENCOUNTER — Encounter: Payer: Self-pay | Admitting: Family Medicine

## 2017-07-07 LAB — BASIC METABOLIC PANEL
BUN/Creatinine Ratio: 18 (ref 9–23)
BUN: 10 mg/dL (ref 6–24)
CO2: 26 mmol/L (ref 20–29)
Calcium: 8.6 mg/dL — ABNORMAL LOW (ref 8.7–10.2)
Chloride: 106 mmol/L (ref 96–106)
Creatinine, Ser: 0.57 mg/dL (ref 0.57–1.00)
GFR calc Af Amer: 125 mL/min/{1.73_m2} (ref >59)
GFR calc non Af Amer: 108 mL/min/{1.73_m2} (ref >59)
Glucose: 78 mg/dL (ref 65–99)
Potassium: 3.5 mmol/L (ref 3.5–5.2)
Sodium: 146 mmol/L — ABNORMAL HIGH (ref 134–144)

## 2017-07-14 ENCOUNTER — Encounter: Payer: Self-pay | Admitting: Family

## 2017-07-15 NOTE — Telephone Encounter (Signed)
See pictures in the message , please advise, thanks

## 2017-07-23 ENCOUNTER — Telehealth: Payer: Self-pay | Admitting: Family

## 2017-07-23 DIAGNOSIS — IMO0002 Reserved for concepts with insufficient information to code with codable children: Secondary | ICD-10-CM

## 2017-07-23 DIAGNOSIS — E118 Type 2 diabetes mellitus with unspecified complications: Principal | ICD-10-CM

## 2017-07-23 DIAGNOSIS — Z1211 Encounter for screening for malignant neoplasm of colon: Secondary | ICD-10-CM

## 2017-07-23 DIAGNOSIS — E1165 Type 2 diabetes mellitus with hyperglycemia: Secondary | ICD-10-CM

## 2017-07-23 DIAGNOSIS — Z794 Long term (current) use of insulin: Principal | ICD-10-CM

## 2017-07-23 NOTE — Telephone Encounter (Signed)
Patient scheduled for 09/27/17 for [facility] Quality Metric Metrics missing. A1c , Microalbumin, PHQ 9 depression screening, Colonoscopy. Thisis just [employer]

## 2017-07-26 DIAGNOSIS — Z0000 Encounter for general adult medical examination without abnormal findings: Secondary | ICD-10-CM | POA: Insufficient documentation

## 2017-07-26 DIAGNOSIS — Z113 Encounter for screening for infections with a predominantly sexual mode of transmission: Secondary | ICD-10-CM | POA: Insufficient documentation

## 2017-07-26 DIAGNOSIS — Z1211 Encounter for screening for malignant neoplasm of colon: Secondary | ICD-10-CM | POA: Insufficient documentation

## 2017-07-26 NOTE — Telephone Encounter (Signed)
Call pt  Let her know colonoscopy ordered ahead of appt this month; she will get a call to schedule. Let me know if she is not agreeable to this

## 2017-07-26 NOTE — Telephone Encounter (Signed)
Patient wants to know why she needs a colonoscopy.  What are the reasons.  please advise.

## 2017-07-26 NOTE — Telephone Encounter (Signed)
Explain that our nurses review preventative maintenance - noticed that she is 51 yo and that is when we do first colonoscopy.   If she wants to defer for now, we can cancel order

## 2017-07-26 NOTE — Telephone Encounter (Signed)
She wants to cancel due to having a DS.  She wants to talk to her bariatric provider before anything.

## 2017-07-26 NOTE — Addendum Note (Signed)
Addended by: [name] on: 07/26/2017 10:20 AM   Modules accepted: Orders

## 2017-08-02 DIAGNOSIS — M5430 Sciatica, unspecified side: Secondary | ICD-10-CM | POA: Diagnosis not present

## 2017-08-02 DIAGNOSIS — M9905 Segmental and somatic dysfunction of pelvic region: Secondary | ICD-10-CM | POA: Diagnosis not present

## 2017-08-02 DIAGNOSIS — M791 Myalgia: Secondary | ICD-10-CM | POA: Diagnosis not present

## 2017-08-03 DIAGNOSIS — R1013 Epigastric pain: Secondary | ICD-10-CM | POA: Diagnosis not present

## 2017-08-03 DIAGNOSIS — I10 Essential (primary) hypertension: Secondary | ICD-10-CM | POA: Diagnosis not present

## 2017-08-03 DIAGNOSIS — Z9884 Bariatric surgery status: Secondary | ICD-10-CM | POA: Diagnosis not present

## 2017-08-15 ENCOUNTER — Encounter: Payer: Self-pay | Admitting: Family

## 2017-08-16 DIAGNOSIS — M5430 Sciatica, unspecified side: Secondary | ICD-10-CM | POA: Diagnosis not present

## 2017-08-16 DIAGNOSIS — M791 Myalgia, unspecified site: Secondary | ICD-10-CM | POA: Diagnosis not present

## 2017-08-16 DIAGNOSIS — M9905 Segmental and somatic dysfunction of pelvic region: Secondary | ICD-10-CM | POA: Diagnosis not present

## 2017-08-17 ENCOUNTER — Ambulatory Visit (INDEPENDENT_AMBULATORY_CARE_PROVIDER_SITE_OTHER): Payer: 59 | Admitting: Licensed Clinical Social Worker

## 2017-08-17 DIAGNOSIS — F3341 Major depressive disorder, recurrent, in partial remission: Secondary | ICD-10-CM | POA: Diagnosis not present

## 2017-08-23 DIAGNOSIS — M5430 Sciatica, unspecified side: Secondary | ICD-10-CM | POA: Diagnosis not present

## 2017-08-23 DIAGNOSIS — M791 Myalgia, unspecified site: Secondary | ICD-10-CM | POA: Diagnosis not present

## 2017-08-23 DIAGNOSIS — M9905 Segmental and somatic dysfunction of pelvic region: Secondary | ICD-10-CM | POA: Diagnosis not present

## 2017-08-24 ENCOUNTER — Other Ambulatory Visit: Payer: Self-pay

## 2017-08-24 DIAGNOSIS — T50995A Adverse effect of other drugs, medicaments and biological substances, initial encounter: Secondary | ICD-10-CM | POA: Diagnosis not present

## 2017-08-24 DIAGNOSIS — N132 Hydronephrosis with renal and ureteral calculous obstruction: Secondary | ICD-10-CM | POA: Diagnosis not present

## 2017-08-24 DIAGNOSIS — L298 Other pruritus: Secondary | ICD-10-CM | POA: Diagnosis not present

## 2017-08-24 DIAGNOSIS — R079 Chest pain, unspecified: Secondary | ICD-10-CM | POA: Diagnosis not present

## 2017-08-24 DIAGNOSIS — T508X5A Adverse effect of diagnostic agents, initial encounter: Secondary | ICD-10-CM | POA: Diagnosis not present

## 2017-08-24 DIAGNOSIS — R1013 Epigastric pain: Secondary | ICD-10-CM | POA: Diagnosis not present

## 2017-08-24 DIAGNOSIS — N201 Calculus of ureter: Secondary | ICD-10-CM | POA: Diagnosis not present

## 2017-08-25 DIAGNOSIS — D2239 Melanocytic nevi of other parts of face: Secondary | ICD-10-CM | POA: Diagnosis not present

## 2017-08-25 DIAGNOSIS — D1801 Hemangioma of skin and subcutaneous tissue: Secondary | ICD-10-CM | POA: Diagnosis not present

## 2017-08-25 DIAGNOSIS — L821 Other seborrheic keratosis: Secondary | ICD-10-CM | POA: Diagnosis not present

## 2017-08-30 DIAGNOSIS — M791 Myalgia, unspecified site: Secondary | ICD-10-CM | POA: Diagnosis not present

## 2017-08-30 DIAGNOSIS — M5430 Sciatica, unspecified side: Secondary | ICD-10-CM | POA: Diagnosis not present

## 2017-08-30 DIAGNOSIS — M9905 Segmental and somatic dysfunction of pelvic region: Secondary | ICD-10-CM | POA: Diagnosis not present

## 2017-08-31 ENCOUNTER — Ambulatory Visit: Payer: 59 | Admitting: Urology

## 2017-08-31 ENCOUNTER — Encounter: Payer: Self-pay | Admitting: Urology

## 2017-08-31 VITALS — BP 158/77 | HR 62 | Ht 68.0 in | Wt 173.0 lb

## 2017-08-31 DIAGNOSIS — N201 Calculus of ureter: Secondary | ICD-10-CM

## 2017-08-31 DIAGNOSIS — R82992 Hyperoxaluria: Secondary | ICD-10-CM | POA: Diagnosis not present

## 2017-08-31 DIAGNOSIS — N200 Calculus of kidney: Secondary | ICD-10-CM

## 2017-08-31 DIAGNOSIS — N132 Hydronephrosis with renal and ureteral calculous obstruction: Secondary | ICD-10-CM | POA: Diagnosis not present

## 2017-08-31 NOTE — Progress Notes (Signed)
08/31/2017 12:43 PM   Arville Go Loetta Rough 1965/11/27 [id number]  Referring provider: [name], FNP [address] [place],  [address]  Chief Complaint  Patient presents with  . Nephrolithiasis    HPI: 51 y.o. female with a history of recurrent stone disease and prior bariatric surgery.  She has a history of ureteral stent placement and shockwave lithotripsy by Dr. Yves Dill in 2013.  She saw Dr. Erlene Quan in February 2018.  A prior CT November 2017 showed a nonobstructing punctate 2 mm right renal calculus.  24-hour urine study was remarkable for low urine volume and hyperoxaluria.    A recent abdominal MRI was performed for epigastric pain and she was incidentally found to have right hydronephrosis.  A stone protocol CT of the abdomen and pelvis was subsequently performed which showed right hydronephrosis secondary to 6.5 and 9 mm proximal ureteral calculi.  She was also noted to have clusters of 1.5 mm calculi in the right upper mid and lower poles as well as nonobstructing left nephrolithiasis.  She has occasional right flank pain and occasional nausea but states her pain is not severe.  Severity is rated mild to moderate.  There are no identifiable precipitating, aggravating or alleviating factors.  She denies fever or chills.  She has gross hematuria on occasion.  Severe is rated mild to moderate  PMH: Past Medical History:  Diagnosis Date  . Arthritis   . Asthma   . Diabetes mellitus without complication ([place])   . Hyperlipidemia   . Hypertension   . Kidney stone   . Migraine    Has used imitrex  . Sleep apnea     Surgical History: Past Surgical History:  Procedure Laterality Date  . BARIATRIC SURGERY  2017  . BREAST BIOPSY Right 2017   FIBROCYSTIC CHANGES WITH CALCIFICATIONS  . CESAREAN SECTION    . TONSILLECTOMY AND ADENOIDECTOMY      Home Medications:  Allergies as of 08/31/2017      Reactions   Latex Shortness Of Breath   Reaction:   SOB, coughing   Other Shortness Of Breath   Reaction:  SOB, coughing   Tolmetin Hives   Levemir [insulin Detemir]    Hives   Nsaids Hives      Medication List       Accurate as of 08/31/17 12:43 PM. Always use your most recent med list.          BARIATRIC FUSION Chew Chew by mouth.   calcium-vitamin D 500-200 MG-UNIT Tabs tablet Commonly known as:  OSCAL WITH D Take by mouth.   FLUoxetine 20 MG capsule Commonly known as:  PROZAC Take 1 capsule (20 mg total) by mouth every morning.   glucose blood test strip Commonly known as:  ONE TOUCH ULTRA TEST CHECK SUGAR 4 TIMES DAILY   lisinopril 5 MG tablet Commonly known as:  PRINIVIL,ZESTRIL Take 1 tablet (5 mg total) by mouth daily.   vitamin A 25000 UNIT capsule Take 25,000 Units by mouth daily.       Allergies:  Allergies  Allergen Reactions  . Latex Shortness Of Breath    Reaction:  SOB, coughing  . Other Shortness Of Breath    Reaction:  SOB, coughing  . Tolmetin Hives  . Levemir [Insulin Detemir]     Hives  . Nsaids Hives    Family History: Family History  Problem Relation Age of Onset  . Alcohol abuse Mother   . Hyperlipidemia Mother   . Hypertension  Mother   . Hyperlipidemia Father   . Hypertension Father   . Parkinson's disease Father   . Drug abuse Sister   . Cancer Maternal Grandmother        breast cancer  . Breast cancer Maternal Grandmother   . Cancer Paternal Grandmother   . Kidney cancer Neg Hx   . Prostate cancer Neg Hx     Social History:  reports that she has never smoked. She has never used smokeless tobacco. She reports that she does not drink alcohol or use drugs.  ROS: UROLOGY Frequent Urination?: No Hard to postpone urination?: No Burning/pain with urination?: No Get up at night to urinate?: No Leakage of urine?: No Urine stream starts and stops?: No Trouble starting stream?: No Do you have to strain to urinate?: No Blood in urine?: Yes Urinary tract infection?:  No Sexually transmitted disease?: No Injury to kidneys or bladder?: No Painful intercourse?: No Weak stream?: No Currently pregnant?: No Vaginal bleeding?: No Last menstrual period?: n  Gastrointestinal Nausea?: No Vomiting?: No Indigestion/heartburn?: No Diarrhea?: No Constipation?: No  Constitutional Fever: No Night sweats?: No Weight loss?: No Fatigue?: No  Skin Skin rash/lesions?: No Itching?: No  Eyes Blurred vision?: No Double vision?: No  Ears/Nose/Throat Sore throat?: No Sinus problems?: No  Hematologic/Lymphatic Swollen glands?: No Easy bruising?: No  Cardiovascular Leg swelling?: No Chest pain?: No  Respiratory Cough?: No Shortness of breath?: No  Endocrine Excessive thirst?: No  Musculoskeletal Back pain?: No Joint pain?: No  Neurological Headaches?: No Dizziness?: No  Psychologic Depression?: No Anxiety?: No  Physical Exam: BP (!) 158/77   Pulse 62   Ht 5\' 8"  (1.727 m)   Wt 173 lb (78.5 kg)   BMI 26.30 kg/m   Constitutional:  Alert and oriented, No acute distress. HEENT: [place] AT, moist mucus membranes.  Trachea midline, no masses. Cardiovascular: No clubbing, cyanosis, or edema.  RRR without murmur. Respiratory: Normal respiratory effort, no increased work of breathing.  Lungs clear. GI: Abdomen is soft, nontender, nondistended, no abdominal masses GU: No CVA tenderness.  Skin: No rashes, bruises or suspicious lesions. Lymph: No cervical or inguinal adenopathy. Neurologic: Grossly intact, no focal deficits, moving all 4 extremities. Psychiatric: Normal mood and affect.  Laboratory Data: Lab Results  Component Value Date   WBC 4.3 09/22/2016   HGB 13.2 09/22/2016   HCT 39.5 09/22/2016   MCV 86 09/22/2016   PLT 176 09/22/2016    Lab Results  Component Value Date   CREATININE 0.57 07/06/2017    Lab Results  Component Value Date   HGBA1C 8.8 (H) 02/04/2016    Urinalysis Lab Results  Component Value Date    SPECGRAV >=1.030 (A) 07/02/2017   PHUR 6.0 07/02/2017   COLORU yellow 07/02/2017   APPEARANCEUR Hazy (A) 10/30/2016   LEUKOCYTESUR Negative 07/02/2017   PROTEINUR 1+ 07/02/2017   GLUCOSEU Negative 10/30/2016   KETONESU negative 07/02/2017   RBCU Negative 10/30/2016   BILIRUBINUR negative 07/02/2017   UUROB 1.0 10/30/2016   NITRITE negative 07/02/2017    Lab Results  Component Value Date   LABMICR See below: 10/30/2016   WBCUA 0-5 10/30/2016   RBCUA 0-2 10/30/2016   LABEPIT >10 (A) 10/30/2016   MUCUS Present (A) 10/30/2016   BACTERIA Few 10/30/2016    Pertinent Imaging:  CT performed at [facility] 08/24/2017 was personally reviewed    HISTORY:  Renal colic, contrast reaction from MRI contrast, epigastric abdominal pain  COMPARISON: MRCP performed earlier the same day  CONTRAST:  None   TECHNIQUE: Spiral CT performed from the dome of the diaphragm to the pubic symphysis without intravenous or oral contrast.   Dose reduction technique was used on this scan by utilizing automated exposure control, adjustment of the mA and/or kV according to the patient size.  DLP:  468 mGy-cm.  FINDINGS: Parenchymal evaluation is limited in the absence of intravenous contrast..  GU: Moderate right hydronephrosis and proximal right hydroureter secondary to 2 adjacent proximal ureteral calculi measuring 6.5 mm and 0.9 cm, visible on scout topogram. The mid to distal right ureter is decompressed. There are nonobstructing calculi noted in the right renal collecting system, measuring 1.5 mm in the midpole, 1.5 mm in the anterior lower pole and clustered calculi layering posteriorly in the lower pole up to 3.5 mm. Small calcification noted in the anterior medial upper pole right kidney and punctate nonobstructing calculi in the upper pole left kidney measuring 1.5 mm. No left urinary tract dilation.     No left ureterolithiasis. Normal adrenal morphology.  Bladder is unremarkable. Anteverted uterus with a  normal contour.  Hepatobiliary: No focal hepatic lesion or intrahepatic biliary dilatation.  Pancreas normal morphology and duct caliber. Gallbladder is surgically absent. Normal splenic parenchyma.   GI: No evidence for bowel obstruction. No mesenteric stranding or wall thickening. No intraperitoneal adenopathy, masses or fluid.  Normal appendix. 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  Retroperitoneum: Normal aortic caliber. No retroperitoneal adenopathy.   Musculoskeletal: No focal lytic or blastic lesion. No fracture or acute derangement. Subcutaneous fat stranding in the anterior abdominal wall related to mild abdominal wall edema. Coarse calcification is noted within the right groin measuring 0.9 cm  Lung bases: No significant opacities or pleural fluid.    IMPRESSION:  Moderate right hydroureteronephrosis secondary to proximal right ureteral calculi measuring up to 0.9 cm. Bilateral nephrolithiasis, right greater than left. No left urinary tract dilation. Evidence of gastric surgery.   Assessment & Plan:    1. Right ureteral calculus Obstructing right proximal ureteral calculi with moderate hydronephrosis.  Based on stone size she was informed it is unlikely these will pass spontaneously.  Treatment options were discussed including shockwave lithotripsy and ureteroscopy with laser lithotripsy.  Based on her stone burden she was informed that it is unlikely she would be completely cleared with shockwave lithotripsy.  She would have a better chance at clearance with ureteroscopic removal.  Present cons of each treatment were discussed.  She would like an attempt at complete clearance of both her renal and ureteral calculi and desires to schedule ureteroscopy.  The indications and nature of the planned procedure were discussed as well as the potential  benefits and expected outcome.  Alternatives have been discussed in detail. The most common complications and side  effects were discussed including but not limited to infection/sepsis; blood loss; damage to urethra, bladder, ureter, kidney; need for multiple surgeries; need for prolonged stent placement as well as general anesthesia risks. Although uncommon she was also informed of the possibility that the calculi she was also informed there is no guarantee that may not be able to be treated due to inability to obtain access to the upper ureter. In that event she would require stent placement and a follow-up procedure after a period of stent dilation.  Her she was also informed that there is no guarantee that all of her stones will be able to be completely removed.  She indicated all her questions were answered and she desires to proceed.  At the time of our visit he she did not appear to be distracted or in pain.   2. Hydronephrosis with urinary obstruction due to ureteral calculus As above  3. Nephrolithiasis Significant stone burden has developed in less than 1 year.  Prior 24 urine study with hyperoxaluria and low urine volume.  She may benefit from stone specific dietary counseling and would like an appointment in the comprehensive stone clinic at [facility].  - Urinalysis, Complete - CULTURE, [place], [facility] [address], [place], [place] [address] [phone]

## 2017-09-01 ENCOUNTER — Telehealth: Payer: Self-pay

## 2017-09-01 LAB — URINALYSIS, COMPLETE
Bilirubin, UA: NEGATIVE
Glucose, UA: NEGATIVE
Ketones, UA: NEGATIVE
Nitrite, UA: NEGATIVE
Protein, UA: NEGATIVE
Specific Gravity, UA: 1.02 (ref 1.005–1.030)
Urobilinogen, Ur: 0.2 mg/dL (ref 0.2–1.0)
pH, UA: 6 (ref 5.0–7.5)

## 2017-09-01 LAB — MICROSCOPIC EXAMINATION
Bacteria, UA: NONE SEEN
Epithelial Cells (non renal): NONE SEEN /hpf (ref 0–10)

## 2017-09-01 NOTE — Telephone Encounter (Signed)
The patient is scheduled for surgery with Dr Bernardo Heater at [facility] on 09/10/17. She will pre admit by phone on 09/07/17. The patient is aware of dates and instructions.

## 2017-09-03 DIAGNOSIS — Z9884 Bariatric surgery status: Secondary | ICD-10-CM | POA: Insufficient documentation

## 2017-09-03 LAB — CULTURE, URINE COMPREHENSIVE

## 2017-09-06 ENCOUNTER — Ambulatory Visit (INDEPENDENT_AMBULATORY_CARE_PROVIDER_SITE_OTHER): Payer: 59 | Admitting: Licensed Clinical Social Worker

## 2017-09-06 DIAGNOSIS — F3341 Major depressive disorder, recurrent, in partial remission: Secondary | ICD-10-CM | POA: Diagnosis not present

## 2017-09-07 ENCOUNTER — Encounter
Admission: RE | Admit: 2017-09-07 | Discharge: 2017-09-07 | Disposition: A | Discharge status: Home / Self Care | Payer: 59 | Source: Ambulatory Visit | Attending: Urology | Admitting: Urology

## 2017-09-07 DIAGNOSIS — N200 Calculus of kidney: Secondary | ICD-10-CM | POA: Diagnosis present

## 2017-09-07 DIAGNOSIS — G4730 Sleep apnea, unspecified: Secondary | ICD-10-CM | POA: Diagnosis not present

## 2017-09-07 DIAGNOSIS — I10 Essential (primary) hypertension: Secondary | ICD-10-CM | POA: Diagnosis not present

## 2017-09-07 DIAGNOSIS — F329 Major depressive disorder, single episode, unspecified: Secondary | ICD-10-CM | POA: Diagnosis not present

## 2017-09-07 DIAGNOSIS — N132 Hydronephrosis with renal and ureteral calculous obstruction: Secondary | ICD-10-CM | POA: Diagnosis not present

## 2017-09-07 DIAGNOSIS — Z9884 Bariatric surgery status: Secondary | ICD-10-CM | POA: Diagnosis not present

## 2017-09-07 DIAGNOSIS — E119 Type 2 diabetes mellitus without complications: Secondary | ICD-10-CM | POA: Diagnosis not present

## 2017-09-07 DIAGNOSIS — Z79899 Other long term (current) drug therapy: Secondary | ICD-10-CM | POA: Diagnosis not present

## 2017-09-07 HISTORY — DX: Malignant (primary) neoplasm, unspecified: C80.1

## 2017-09-07 HISTORY — DX: Carrier or suspected carrier of methicillin resistant Staphylococcus aureus: Z22.322

## 2017-09-07 LAB — SURGICAL PCR SCREEN
MRSA, PCR: NEGATIVE
Staphylococcus aureus: NEGATIVE

## 2017-09-07 NOTE — Patient Instructions (Signed)
  Your procedure is scheduled on: 09-10-17 FRIDAY Report to Same Day Surgery 2nd floor medical mall [facility] Entrance-take elevator on left to 2nd floor.  Check in with surgery information desk.) To find out your arrival time please call [phone] between 1PM - 3PM on 09-09-17 THURSDAY  Remember: Instructions that are not followed completely may result in serious medical risk, up to and including death, or upon the discretion of your surgeon and anesthesiologist your surgery may need to be rescheduled.    _x___ 1. Do not eat food after midnight the night before your procedure. NO GUM CHEWING OR HARD CANDIES.  You may drink clear liquids up to 2 hours before you are scheduled to arrive at the hospital for your procedure.  Do not drink clear liquids within 2 hours of your scheduled arrival to the hospital.  Clear liquids include  --Water or Apple juice without pulp  --Clear carbohydrate beverage such as ClearFast or Gatorade  --Black Coffee or Clear Tea (No milk, no creamers, do not add anything to  the coffee or Tea) .     __x__ 2. No Alcohol for 24 hours before or after surgery.   __x__3. No Smoking for 24 prior to surgery.   ____  4. Bring all medications with you on the day of surgery if instructed.    __x__ 5. Notify your doctor if there is any change in your medical condition     (cold, fever, infections).     Do not wear jewelry, make-up, hairpins, clips or nail polish.  Do not wear lotions, powders, or perfumes. You may wear deodorant.  Do not shave 48 hours prior to surgery. Men may shave face and neck.  Do not bring valuables to the hospital.    [facility] is not responsible for any belongings or valuables.               Contacts, dentures or bridgework may not be worn into surgery.  Leave your suitcase in the car. After surgery it may be brought to your room.  For patients admitted to the hospital, discharge time is determined by your treatment team.   Patients  discharged the day of surgery will not be allowed to drive home.  You will need someone to drive you home and stay with you the night of your procedure.    Please read over the following fact sheets that you were given:   [facility] Preparing for Surgery and or MRSA Information   ____ TAKE THE FOLLOWING MEDICATIONS THE MORNING OF SURGERY WITH A SMALL SIP OF WATER. These include:  1. NONE  2.  3.  4.  5.  6.  ____Fleets enema or Magnesium Citrate as directed.   ____ Use CHG Soap or sage wipes as directed on instruction sheet   ____ Use inhalers on the day of surgery and bring to hospital day of surgery  ____ Stop Metformin and Janumet 2 days prior to surgery.    ____ Take 1/2 of usual insulin dose the night before surgery and none on the morning surgery.   ____ Follow recommendations from Cardiologist, Pulmonologist or PCP regarding stopping Aspirin, Coumadin, Plavix ,Eliquis, Effient, or Pradaxa, and Pletal.  X____Stop Anti-inflammatories such as Advil, Aleve, IBUPROFEN, Motrin, Naproxen, Naprosyn, Goodies powders or aspirin products NOW-OK to take Tylenol   ____ Stop supplements until after surgery.  .   ____ Bring C-Pap to the hospital.

## 2017-09-07 NOTE — Pre-Procedure Instructions (Signed)
ECG 12 lead;08/24/2017 [facility] Result Impression  Sinus bradycardia with sinus arrhythmia Otherwise normal ECG No previous ECGs available Confirmed by CICIN, JERFI D. ([id number]) on 08/24/2017 1:26:56 PM  Result Narrative  Ventricular Rate: 56 Atrial Rate: 56 P-R Interval: 164 QRS Duration: 96 Q-T Interval: 436 QTC Calculation(Bazett): 420 P Axis: 42 R Axis: -6 T Axis: -10  Other Result Information  Interface, Muse Results In - 08/24/2017  1:26 PM EDT Ventricular Rate: 56 Atrial Rate: 56 P-R Interval: 164 QRS Duration: 96 Q-T Interval: 436 QTC Calculation(Bazett): 420 P Axis: 42 R Axis: -6 T Axis: -10 Impression: Sinus bradycardia with sinus arrhythmia Otherwise normal ECG No previous ECGs available Confirmed by CICIN, JERFI D. ([id number]) on 08/24/2017 1:26:56 PM  Status Results Details    Hospital Encounter on 08/24/2017 [facility]")' href="[email or web address]/">Encounter Summary  2017

## 2017-09-09 ENCOUNTER — Encounter: Payer: Self-pay | Admitting: *Deleted

## 2017-09-09 MED ORDER — CEFAZOLIN SODIUM-DEXTROSE 2-4 GM/100ML-% IV SOLN
2.0000 g | Freq: Once | INTRAVENOUS | Status: AC
  Administered 2017-09-10: 2 g via INTRAVENOUS

## 2017-09-10 ENCOUNTER — Ambulatory Visit: Payer: Self-pay | Admitting: Urology

## 2017-09-10 ENCOUNTER — Ambulatory Visit: Payer: 59 | Admitting: Registered Nurse

## 2017-09-10 ENCOUNTER — Encounter
Admission: RE | Disposition: A | Discharge status: Home / Self Care | Payer: Self-pay | Source: Ambulatory Visit | Attending: Urology

## 2017-09-10 ENCOUNTER — Ambulatory Visit
Admission: RE | Admit: 2017-09-10 | Discharge: 2017-09-10 | Disposition: A | Discharge status: Home / Self Care | Payer: 59 | Source: Ambulatory Visit | Attending: Urology | Admitting: Urology

## 2017-09-10 DIAGNOSIS — G4730 Sleep apnea, unspecified: Secondary | ICD-10-CM | POA: Diagnosis not present

## 2017-09-10 DIAGNOSIS — Z9884 Bariatric surgery status: Secondary | ICD-10-CM | POA: Insufficient documentation

## 2017-09-10 DIAGNOSIS — N201 Calculus of ureter: Secondary | ICD-10-CM

## 2017-09-10 DIAGNOSIS — N202 Calculus of kidney with calculus of ureter: Secondary | ICD-10-CM | POA: Diagnosis not present

## 2017-09-10 DIAGNOSIS — N132 Hydronephrosis with renal and ureteral calculous obstruction: Secondary | ICD-10-CM | POA: Insufficient documentation

## 2017-09-10 DIAGNOSIS — I10 Essential (primary) hypertension: Secondary | ICD-10-CM | POA: Diagnosis not present

## 2017-09-10 DIAGNOSIS — E119 Type 2 diabetes mellitus without complications: Secondary | ICD-10-CM | POA: Insufficient documentation

## 2017-09-10 DIAGNOSIS — F329 Major depressive disorder, single episode, unspecified: Secondary | ICD-10-CM | POA: Insufficient documentation

## 2017-09-10 DIAGNOSIS — Z79899 Other long term (current) drug therapy: Secondary | ICD-10-CM | POA: Insufficient documentation

## 2017-09-10 HISTORY — PX: URETEROSCOPY WITH HOLMIUM LASER LITHOTRIPSY: SHX6645

## 2017-09-10 HISTORY — PX: CYSTOSCOPY WITH STENT PLACEMENT: SHX5790

## 2017-09-10 SURGERY — URETEROSCOPY, WITH LITHOTRIPSY USING HOLMIUM LASER
Anesthesia: General | Site: Ureter | Laterality: Right | Wound class: Clean Contaminated

## 2017-09-10 MED ORDER — FENTANYL CITRATE (PF) 100 MCG/2ML IJ SOLN
INTRAMUSCULAR | Status: AC
  Administered 2017-09-10: 25 ug via INTRAVENOUS
  Filled 2017-09-10: qty 2

## 2017-09-10 MED ORDER — LIDOCAINE HCL (PF) 2 % IJ SOLN
INTRAMUSCULAR | Status: AC
  Filled 2017-09-10: qty 10

## 2017-09-10 MED ORDER — ONDANSETRON HCL 4 MG/2ML IJ SOLN
INTRAMUSCULAR | Status: AC
  Filled 2017-09-10: qty 2

## 2017-09-10 MED ORDER — ACETAMINOPHEN 10 MG/ML IV SOLN
INTRAVENOUS | Status: AC
  Filled 2017-09-10: qty 100

## 2017-09-10 MED ORDER — FENTANYL CITRATE (PF) 100 MCG/2ML IJ SOLN
INTRAMUSCULAR | Status: DC | PRN
  Administered 2017-09-10: 100 ug via INTRAVENOUS

## 2017-09-10 MED ORDER — PROPOFOL 10 MG/ML IV BOLUS
INTRAVENOUS | Status: DC | PRN
  Administered 2017-09-10: 150 mg via INTRAVENOUS

## 2017-09-10 MED ORDER — ROCURONIUM BROMIDE 100 MG/10ML IV SOLN
INTRAVENOUS | Status: DC | PRN
  Administered 2017-09-10: 20 mg via INTRAVENOUS
  Administered 2017-09-10: 10 mg via INTRAVENOUS
  Administered 2017-09-10: 40 mg via INTRAVENOUS
  Administered 2017-09-10: 10 mg via INTRAVENOUS

## 2017-09-10 MED ORDER — EPHEDRINE SULFATE 50 MG/ML IJ SOLN
INTRAMUSCULAR | Status: DC | PRN
  Administered 2017-09-10 (×3): 10 mg via INTRAVENOUS

## 2017-09-10 MED ORDER — DIPHENHYDRAMINE HCL 50 MG/ML IJ SOLN
25.0000 mg | Freq: Once | INTRAMUSCULAR | Status: AC
  Administered 2017-09-10: 25 mg via INTRAVENOUS

## 2017-09-10 MED ORDER — HYDROCODONE-ACETAMINOPHEN 5-325 MG PO TABS: 1.0000 | ORAL_TABLET | ORAL | 0 refills | Status: DC | PRN

## 2017-09-10 MED ORDER — DEXAMETHASONE SODIUM PHOSPHATE 10 MG/ML IJ SOLN
INTRAMUSCULAR | Status: AC
  Filled 2017-09-10: qty 1

## 2017-09-10 MED ORDER — FENTANYL CITRATE (PF) 100 MCG/2ML IJ SOLN
INTRAMUSCULAR | Status: AC
  Filled 2017-09-10: qty 2

## 2017-09-10 MED ORDER — SUCCINYLCHOLINE CHLORIDE 20 MG/ML IJ SOLN
INTRAMUSCULAR | Status: AC
  Filled 2017-09-10: qty 1

## 2017-09-10 MED ORDER — PROPOFOL 10 MG/ML IV BOLUS
INTRAVENOUS | Status: AC
  Filled 2017-09-10: qty 40

## 2017-09-10 MED ORDER — SUGAMMADEX SODIUM 500 MG/5ML IV SOLN
INTRAVENOUS | Status: DC | PRN
  Administered 2017-09-10: 400 mg via INTRAVENOUS

## 2017-09-10 MED ORDER — SUGAMMADEX SODIUM 500 MG/5ML IV SOLN
INTRAVENOUS | Status: AC
  Filled 2017-09-10: qty 5

## 2017-09-10 MED ORDER — GLYCOPYRROLATE 0.2 MG/ML IJ SOLN
INTRAMUSCULAR | Status: AC
Start: 2017-09-10 — End: ?
  Filled 2017-09-10: qty 1

## 2017-09-10 MED ORDER — ONDANSETRON HCL 4 MG/2ML IJ SOLN
INTRAMUSCULAR | Status: DC | PRN
  Administered 2017-09-10: 4 mg via INTRAVENOUS

## 2017-09-10 MED ORDER — DEXAMETHASONE SODIUM PHOSPHATE 10 MG/ML IJ SOLN
INTRAMUSCULAR | Status: DC | PRN
  Administered 2017-09-10: 10 mg via INTRAVENOUS

## 2017-09-10 MED ORDER — EPHEDRINE SULFATE 50 MG/ML IJ SOLN
INTRAMUSCULAR | Status: AC
Start: 2017-09-10 — End: ?
  Filled 2017-09-10: qty 1

## 2017-09-10 MED ORDER — FENTANYL CITRATE (PF) 100 MCG/2ML IJ SOLN
25.0000 ug | INTRAMUSCULAR | Status: DC | PRN
  Administered 2017-09-10 (×2): 25 ug via INTRAVENOUS

## 2017-09-10 MED ORDER — OXYBUTYNIN CHLORIDE 5 MG PO TABS: 5.0000 mg | ORAL_TABLET | Freq: Three times a day (TID) | ORAL | 0 refills | Status: DC | PRN

## 2017-09-10 MED ORDER — MIDAZOLAM HCL 2 MG/2ML IJ SOLN
INTRAMUSCULAR | Status: DC | PRN
  Administered 2017-09-10: 2 mg via INTRAVENOUS

## 2017-09-10 MED ORDER — LACTATED RINGERS IV SOLN
INTRAVENOUS | Status: DC
  Administered 2017-09-10: 07:00:00 via INTRAVENOUS

## 2017-09-10 MED ORDER — ROCURONIUM BROMIDE 50 MG/5ML IV SOLN
INTRAVENOUS | Status: AC
  Filled 2017-09-10: qty 1

## 2017-09-10 MED ORDER — DIPHENHYDRAMINE HCL 50 MG/ML IJ SOLN
INTRAMUSCULAR | Status: AC
  Filled 2017-09-10: qty 1

## 2017-09-10 MED ORDER — CEFAZOLIN SODIUM-DEXTROSE 2-4 GM/100ML-% IV SOLN
INTRAVENOUS | Status: AC
  Filled 2017-09-10: qty 100

## 2017-09-10 MED ORDER — MIDAZOLAM HCL 2 MG/2ML IJ SOLN
INTRAMUSCULAR | Status: AC
  Filled 2017-09-10: qty 2

## 2017-09-10 MED ORDER — LIDOCAINE HCL (CARDIAC) 20 MG/ML IV SOLN
INTRAVENOUS | Status: DC | PRN
  Administered 2017-09-10: 100 mg via INTRAVENOUS

## 2017-09-10 MED ORDER — PHENYLEPHRINE HCL 10 MG/ML IJ SOLN
INTRAMUSCULAR | Status: AC
  Filled 2017-09-10: qty 1

## 2017-09-10 MED ORDER — ONDANSETRON HCL 4 MG/2ML IJ SOLN: 4.0000 mg | Freq: Once | INTRAMUSCULAR | Status: DC | PRN

## 2017-09-10 SURGICAL SUPPLY — 31 items
BAG DRAIN CYSTO-URO LG1000N (MISCELLANEOUS) ×2 IMPLANT
BASKET ZERO TIP 1.9FR (BASKET) ×2 IMPLANT
CATH URETL 5X70 OPEN END (CATHETERS) ×2 IMPLANT
CNTNR SPEC 2.5X3XGRAD LEK (MISCELLANEOUS) ×1
CONRAY 43 FOR UROLOGY 50M (MISCELLANEOUS) ×2 IMPLANT
CONT SPEC 4OZ STER OR WHT (MISCELLANEOUS) ×1
CONTAINER SPEC 2.5X3XGRAD LEK (MISCELLANEOUS) ×1 IMPLANT
DRAPE UTILITY 15X26 TOWEL STRL (DRAPES) ×2 IMPLANT
FIBER LASER LITHO 273 (Laser) ×2 IMPLANT
GLIDEWIRE STR 0.035 150CM 3CM (WIRE) ×2 IMPLANT
GLOVE BIO SURGEON STRL SZ 6.5 (GLOVE) ×2 IMPLANT
GOWN STRL REUS W/ TWL LRG LVL3 (GOWN DISPOSABLE) ×2 IMPLANT
GOWN STRL REUS W/TWL LRG LVL3 (GOWN DISPOSABLE) ×2
GUIDEWIRE GREEN .038 145CM (MISCELLANEOUS) IMPLANT
INFUSOR MANOMETER BAG 3000ML (MISCELLANEOUS) ×2 IMPLANT
INTRODUCER DILATOR DOUBLE (INTRODUCER) IMPLANT
KIT RM TURNOVER CYSTO AR (KITS) ×2 IMPLANT
PACK CYSTO AR (MISCELLANEOUS) ×2 IMPLANT
SCRUB POVIDONE IODINE 4 OZ (MISCELLANEOUS) ×2 IMPLANT
SENSORWIRE 0.038 NOT ANGLED (WIRE) ×2
SET CYSTO W/LG BORE CLAMP LF (SET/KITS/TRAYS/PACK) ×2 IMPLANT
SHEATH URETERAL 12FRX35CM (MISCELLANEOUS) IMPLANT
SHEATH URETL 1L 13/15X28 (SHEATH) ×2 IMPLANT
SOL .9 NS 3000ML IRR  AL (IV SOLUTION) ×1
SOL .9 NS 3000ML IRR UROMATIC (IV SOLUTION) ×1 IMPLANT
STENT URET 6FRX24 CONTOUR (STENTS) IMPLANT
STENT URET 6FRX26 CONTOUR (STENTS) IMPLANT
SURGILUBE 2OZ TUBE FLIPTOP (MISCELLANEOUS) ×2 IMPLANT
SYRINGE IRR TOOMEY STRL 70CC (SYRINGE) ×2 IMPLANT
WATER STERILE IRR 1000ML POUR (IV SOLUTION) ×2 IMPLANT
WIRE SENSOR 0.038 NOT ANGLED (WIRE) ×1 IMPLANT

## 2017-09-10 NOTE — Anesthesia Preprocedure Evaluation (Signed)
Anesthesia Evaluation  Patient identified by MRN, date of birth, ID band Patient awake    Airway Mallampati: III  TM Distance: <3 FB     Dental no notable dental hx. (+) Caps   Pulmonary sleep apnea ,    Pulmonary exam normal        Cardiovascular hypertension, Normal cardiovascular exam     Neuro/Psych  Headaches, PSYCHIATRIC DISORDERS Depression    GI/Hepatic negative GI ROS, Neg liver ROS,   Endo/Other  diabetes  Renal/GU stones  negative genitourinary   Musculoskeletal negative musculoskeletal ROS (+)   Abdominal Normal abdominal exam  (+)   Peds negative pediatric ROS (+)  Hematology negative hematology ROS (+)   Anesthesia Other Findings Past Medical History: No date: Cancer ([place])     Comment:  skin cancer on nose No date: Diabetes mellitus without complication ([facility])     Comment:  resolved since gastric bypass in 2017-NO MEDS No date: Hyperlipidemia No date: Hypertension No date: Kidney stone No date: Migraine     Comment:  Has used imitrex-last migraine in 2013 No date: MRSA carrier No date: Sleep apnea     Comment:  had gastric bypass surgery in 2017 and no longer needs               CPAP  Reproductive/Obstetrics                             Anesthesia Physical Anesthesia Plan  ASA: II  Anesthesia Plan: General   Post-op Pain Management:    Induction: Intravenous  PONV Risk Score and Plan:   Airway Management Planned: Oral ETT  Additional Equipment:   Intra-op Plan:   Post-operative Plan: Extubation in OR  Informed Consent: I have reviewed the patients History and Physical, chart, labs and discussed the procedure including the risks, benefits and alternatives for the proposed anesthesia with the patient or authorized representative who has indicated his/her understanding and acceptance.   Dental advisory given  Plan Discussed with: CRNA and  Surgeon  Anesthesia Plan Comments:         Anesthesia Quick Evaluation

## 2017-09-10 NOTE — Discharge Instructions (Addendum)
AMBULATORY SURGERY  DISCHARGE INSTRUCTIONS   1) The drugs that you were given will stay in your system until tomorrow so for the next 24 hours you should not:  A) Drive an automobile B) Make any legal decisions C) Drink any alcoholic beverage   2) You may resume regular meals tomorrow.  Today it is better to start with liquids and gradually work up to solid foods.  You may eat anything you prefer, but it is better to start with liquids, then soup and crackers, and gradually work up to solid foods.   3) Please notify your doctor immediately if you have any unusual bleeding, trouble breathing, redness and pain at the surgery site, drainage, fever, or pain not relieved by medication.    4) Additional Instructions:        Please contact your physician with any problems or Same Day Surgery at [phone], Monday through Friday 6 am to 4 pm, or [place] at [facility] number at [phone].   Ureteral Stent Implantation, Care After Refer to this sheet in the next few weeks. These instructions provide you with information about caring for yourself after your procedure. Your health care provider may also give you more specific instructions. Your treatment has been planned according to current medical practices, but problems sometimes occur. Call your health care provider if you have any problems or questions after your procedure. What can I expect after the procedure? After the procedure, it is common to have:  Nausea.  Mild pain when you urinate. You may feel this pain in your lower back or lower abdomen. Pain should stop within a few minutes after you urinate. This may last for up to 1 week.  A small amount of blood in your urine for several days.  Follow these instructions at home:  Medicines  Take over-the-counter and prescription medicines only as told by your health care provider.  If you were prescribed an antibiotic medicine, take it as told by your health care  provider. Do not stop taking the antibiotic even if you start to feel better.  Do not drive for 24 hours if you received a sedative.  Do not drive or operate heavy machinery while taking prescription pain medicines. Activity  Return to your normal activities as told by your health care provider. Ask your health care provider what activities are safe for you.  Do not lift anything that is heavier than 10 lb (4.5 kg). Follow this limit for 1 week after your procedure, or for as long as told by your health care provider. General instructions  Watch for any blood in your urine. Call your health care provider if the amount of blood in your urine increases.  If you have a catheter: ? Follow instructions from your health care provider about taking care of your catheter and collection bag. ? Do not take baths, swim, or use a hot tub until your health care provider approves.  Drink enough fluid to keep your urine clear or pale yellow.  Keep all follow-up visits as told by your health care provider. This is important. Contact a health care provider if:  You have pain that gets worse or does not get better with medicine, especially pain when you urinate.  You have difficulty urinating.  You feel nauseous or you vomit repeatedly during a period of more than 2 days after the procedure. Get help right away if:  Your urine is dark red or has blood clots in it.  You are  leaking urine (have incontinence).  The end of the stent comes out of your urethra.  You cannot urinate.  You have sudden, sharp, or severe pain in your abdomen or lower back.  You have a fever. This information is not intended to replace advice given to you by your health care provider. Make sure you discuss any questions you have with your health care provider. Document Released: 06/28/2013 Document Revised: 04/02/2016 Document Reviewed: 05/10/2015 Elsevier Interactive Patient Education  [employer].

## 2017-09-10 NOTE — Op Note (Signed)
Preoperative diagnosis:  1.  Right ureteral calculi 2.  Right nephrolithiasis  Postoperative diagnosis:  1.  Right ureteral calculi 2.  Right nephrolithiasis  Procedure:  1. Cystoscopy 2. Right ureteroscopy and stone removal 3. Ureteroscopic laser lithotripsy 4. Right ureteral stent placement    Surgeon: Nicki Reaper C. Stoioff M.D.  Anesthesia: General  Complications: None  Intraoperative findings: Impacted right proximal ureteral calculus, right nephrolithiasis  EBL: Minimal  Specimens: 1. Calculus fragments for analysis   Indication: [name] is a 51 year old female with obstructing right proximal ureteral calculi and right nephrolithiasis.  After reviewing the management options for treatment, she elected to proceed with the above surgical procedure(s). We have discussed the potential benefits and risks of the procedure, side effects of the proposed treatment, the likelihood of the patient achieving the goals of the procedure, and any potential problems that might occur during the procedure or recuperation. Informed consent has been obtained.  Description of procedure:  The patient was taken to the operating room and general anesthesia was induced.  The patient was placed in the dorsal lithotomy position, prepped and draped in the usual sterile fashion, and preoperative antibiotics were administered. A preoperative time-out was performed.   Cystourethroscopy was performed.  The patient's urethra was examined and was normal. The bladder was then systematically examined in its entirety. There was no evidence for any bladder tumors, stones, or other mucosal pathology.    Attention then turned to the right ureteral orifice and a 0.038 sensor guidewire was then advanced up the ureter however it would not advance beyond the right ureteral calculus seen on fluoroscopy.  A 5 French open-ended ureteral catheter was placed over the wire and advanced up to the stone.  A 0.981  hydrophilic guidewire was also not advanced into the renal pelvis.  A 4.5 French semirigid ureteroscope was passed per urethra and the right ureteral orifice was easily engaged.  The ureteroscope was advanced up to the proximal ureter and no significant mucosal abnormalities were identified.  The hydrophilic guidewire was able to be advanced beyond the stone under direct vision.  Due to the stone location it was felt that lithotripsy with the semirigid scope would not be successful and the ureteroscope was removed.    A 13/15 Fr ureteral access sheath was then advanced over the guide wire. The digital flexible ureteroscope was then advanced through the access sheath into the ureter next to the guidewire and the proximal ureteral calculus was identified.  The stone was then fragmented with the 273 micron holmium laser fiber on a setting of J and frequency of 8 Hz and fragments were removed with a 1.9 [place] nitinol basket.   All sizable stones were then removed with a zero tip nitinol basket.  Reinspection of the ureter/renal pelvis revealed no remaining visible stones or fragments of significant size.   The safety wire was then replaced and the access sheath removed.  The guidewire was backloaded through the cystoscope and a ureteral stent was advance over the wire using Seldinger technique.  The stent was positioned appropriately under fluoroscopic and cystoscopic guidance.  The wire was then removed with an adequate stent curl noted in the renal pelvis as well as in the bladder.  The bladder was then emptied and the procedure ended.  The patient appeared to tolerate the procedure well and without complications.  The patient was able to be awakened and transferred to the recovery unit in satisfactory condition.

## 2017-09-10 NOTE — OR Nursing (Signed)
Was itching on arrival to postop - discussed with Dr. Kayleen Memos, benadryl 25mg  IV given as ordered.  Itching resolved 1112 am

## 2017-09-10 NOTE — Transfer of Care (Signed)
Immediate Anesthesia Transfer of Care Note  Patient: [name]  Procedure(s) Performed: Procedure(s): URETEROSCOPY WITH HOLMIUM LASER LITHOTRIPSY (Right) CYSTOSCOPY WITH STENT PLACEMENT (Right)  Patient Location: PACU  Anesthesia Type:General  Level of Consciousness: sedated  Airway & Oxygen Therapy: Patient Spontanous Breathing and Patient connected to face mask oxygen  Post-op Assessment: Report given to RN and Post -op Vital signs reviewed and stable  Post vital signs: Reviewed and stable  Last Vitals:  Vitals:   09/10/17 0634 09/10/17 0931  BP: (!) 165/90 (!) 166/96  Pulse: 60   Resp: 16 20  Temp: 36.4 C 36.6 C  SpO2: 18% 40%    Complications: No apparent anesthesia complications

## 2017-09-10 NOTE — Anesthesia Postprocedure Evaluation (Signed)
Anesthesia Post Note  Patient: [name]  Procedure(s) Performed: URETEROSCOPY WITH HOLMIUM LASER LITHOTRIPSY (Right Ureter) CYSTOSCOPY WITH STENT PLACEMENT (Right Ureter)  Patient location during evaluation: PACU Anesthesia Type: General Level of consciousness: awake and alert and oriented Pain management: pain level controlled Vital Signs Assessment: post-procedure vital signs reviewed and stable Respiratory status: spontaneous breathing Cardiovascular status: blood pressure returned to baseline Anesthetic complications: no     Last Vitals:  Vitals:   09/10/17 1034 09/10/17 1107  BP: (!) 166/82 (!) 159/77  Pulse: 64 (!) 57  Resp: 16 16  Temp: 36.5 C (!) 36.3 C  SpO2: 100% 100%    Last Pain:  Vitals:   09/10/17 1107  TempSrc: Temporal  PainSc:                  Yaniv Lage

## 2017-09-10 NOTE — Anesthesia Post-op Follow-up Note (Signed)
Anesthesia QCDR form completed.        

## 2017-09-10 NOTE — Anesthesia Procedure Notes (Signed)
Procedure Name: Intubation Date/Time: 09/10/2017 7:50 AM Performed by: Doreen Salvage Pre-anesthesia Checklist: Patient identified, Patient being monitored, Timeout performed, Emergency Drugs available and Suction available Patient Re-evaluated:Patient Re-evaluated prior to induction Oxygen Delivery Method: Circle system utilized Preoxygenation: Pre-oxygenation with 100% oxygen Induction Type: IV induction Ventilation: Mask ventilation without difficulty Laryngoscope Size: Mac and 3 Grade View: Grade III Tube type: Oral Tube size: 7.0 mm Number of attempts: 1 Airway Equipment and Method: Stylet Placement Confirmation: ETT inserted through vocal cords under direct vision,  positive ETCO2 and breath sounds checked- equal and bilateral Secured at: 21 cm Tube secured with: Tape Dental Injury: Teeth and Oropharynx as per pre-operative assessment  Difficulty Due To: Difficult Airway- due to anterior larynx

## 2017-09-13 DIAGNOSIS — Z9884 Bariatric surgery status: Secondary | ICD-10-CM | POA: Diagnosis not present

## 2017-09-13 DIAGNOSIS — I10 Essential (primary) hypertension: Secondary | ICD-10-CM | POA: Diagnosis not present

## 2017-09-13 DIAGNOSIS — R1013 Epigastric pain: Secondary | ICD-10-CM | POA: Diagnosis not present

## 2017-09-14 ENCOUNTER — Telehealth: Payer: Self-pay

## 2017-09-14 NOTE — Telephone Encounter (Signed)
Pt called stating she is having gross hematuria and right sided flank pain. Reinforced with pt to increase fluid in take and can apply a heating pad to the flank area. Pt voiced understanding. Pt then stated that she was with the understanding that she was going to keep her stent 3-4 days. Pt stated that her post op f/u for cysto stent removal is 2 weeks out. Pt inquired why. Please advise.

## 2017-09-15 NOTE — Telephone Encounter (Signed)
Her ureteral stone was impacted with significant inflammation and the stent is needed longer due to the likelihood of persistent ureteral edema and the possibility of stricture formation.

## 2017-09-16 MED ORDER — HYDROCODONE-ACETAMINOPHEN 5-325 MG PO TABS: 1.0000 | ORAL_TABLET | Freq: Four times a day (QID) | ORAL | 0 refills | Status: DC | PRN

## 2017-09-16 NOTE — Telephone Encounter (Signed)
hydrocodone was refilled.  I need to sign rx

## 2017-09-16 NOTE — Telephone Encounter (Signed)
Spoke with pt in reference to stent being in place longer. Pt was very upset and wanted to know what she could do about the pain. Reinforced with pt to take ibuprofen and can apply heat to the area. Pt insisted on having more pain meds. Please advise.

## 2017-09-17 LAB — STONE ANALYSIS
Ca Oxalate,Dihydrate: 25 %
Ca Oxalate,Monohydr.: 70 %
Ca phos cry stone ql IR: 5 %
Stone Weight KSTONE: 65.4 mg

## 2017-09-17 NOTE — Telephone Encounter (Signed)
LMOM- can have a refill of pain medication but will need to go to [place] office.

## 2017-09-24 ENCOUNTER — Ambulatory Visit (INDEPENDENT_AMBULATORY_CARE_PROVIDER_SITE_OTHER): Payer: 59 | Admitting: Urology

## 2017-09-24 VITALS — Ht 68.0 in | Wt 177.0 lb

## 2017-09-24 DIAGNOSIS — N200 Calculus of kidney: Secondary | ICD-10-CM

## 2017-09-24 MED ORDER — LIDOCAINE HCL 2 % EX GEL
1.0000 "application " | Freq: Once | CUTANEOUS | Status: AC
  Administered 2017-09-24: 1 via URETHRAL

## 2017-09-24 MED ORDER — CIPROFLOXACIN HCL 500 MG PO TABS
500.0000 mg | ORAL_TABLET | Freq: Once | ORAL | Status: AC
  Administered 2017-09-24: 500 mg via ORAL

## 2017-09-25 LAB — URINALYSIS, COMPLETE
Bilirubin, UA: NEGATIVE
Glucose, UA: NEGATIVE
Nitrite, UA: NEGATIVE
Specific Gravity, UA: 1.025 (ref 1.005–1.030)
Urobilinogen, Ur: 1 mg/dL (ref 0.2–1.0)
pH, UA: 6 (ref 5.0–7.5)

## 2017-09-25 LAB — MICROSCOPIC EXAMINATION: RBC, UA: >30 /hpf — ABNORMAL HIGH (ref 0–?)

## 2017-09-25 NOTE — Progress Notes (Signed)
Indications: Patient is 51 y.o., female who recently underwent right USE  with remaining indwelling JJ ureteral stent.  The patient is presenting today for stent removal.  Procedure:  Flexible Cystoscopy with stent removal (81829)  Timeout was performed and the correct patient, procedure and participants were identified.    Description:  The patient was prepped and draped in the usual sterile fashion. Flexible cystosopy was performed.  The stent was visualized, grasped, and removed intact without difficulty. The patient tolerated the procedure well.  A single dose of oral antibiotics was given.  Complications:  None  Plan: F/U 6 weeks with renal ultrasound

## 2017-09-27 ENCOUNTER — Ambulatory Visit: Payer: Self-pay | Admitting: Family

## 2017-09-27 ENCOUNTER — Ambulatory Visit (INDEPENDENT_AMBULATORY_CARE_PROVIDER_SITE_OTHER): Payer: 59 | Admitting: Licensed Clinical Social Worker

## 2017-09-27 DIAGNOSIS — F3341 Major depressive disorder, recurrent, in partial remission: Secondary | ICD-10-CM | POA: Diagnosis not present

## 2017-10-12 DIAGNOSIS — N200 Calculus of kidney: Secondary | ICD-10-CM | POA: Diagnosis not present

## 2017-10-13 DIAGNOSIS — Z87442 Personal history of urinary calculi: Secondary | ICD-10-CM | POA: Insufficient documentation

## 2017-10-13 DIAGNOSIS — I10 Essential (primary) hypertension: Secondary | ICD-10-CM | POA: Insufficient documentation

## 2017-10-13 DIAGNOSIS — R1013 Epigastric pain: Secondary | ICD-10-CM | POA: Insufficient documentation

## 2017-10-13 DIAGNOSIS — R7989 Other specified abnormal findings of blood chemistry: Secondary | ICD-10-CM | POA: Insufficient documentation

## 2017-10-13 DIAGNOSIS — E559 Vitamin D deficiency, unspecified: Secondary | ICD-10-CM | POA: Diagnosis not present

## 2017-10-13 DIAGNOSIS — Z9884 Bariatric surgery status: Secondary | ICD-10-CM | POA: Diagnosis not present

## 2017-10-14 ENCOUNTER — Other Ambulatory Visit: Payer: Self-pay | Admitting: Family Medicine

## 2017-10-18 ENCOUNTER — Ambulatory Visit: Admission: RE | Admit: 2017-10-18 | Payer: 59 | Source: Ambulatory Visit

## 2017-10-25 ENCOUNTER — Ambulatory Visit (INDEPENDENT_AMBULATORY_CARE_PROVIDER_SITE_OTHER): Payer: 59 | Admitting: Licensed Clinical Social Worker

## 2017-10-25 ENCOUNTER — Telehealth: Payer: Self-pay | Admitting: Family

## 2017-10-25 DIAGNOSIS — M791 Myalgia, unspecified site: Secondary | ICD-10-CM | POA: Diagnosis not present

## 2017-10-25 DIAGNOSIS — F3341 Major depressive disorder, recurrent, in partial remission: Secondary | ICD-10-CM

## 2017-10-25 DIAGNOSIS — Z111 Encounter for screening for respiratory tuberculosis: Secondary | ICD-10-CM

## 2017-10-25 DIAGNOSIS — M9905 Segmental and somatic dysfunction of pelvic region: Secondary | ICD-10-CM | POA: Diagnosis not present

## 2017-10-25 DIAGNOSIS — M5430 Sciatica, unspecified side: Secondary | ICD-10-CM | POA: Diagnosis not present

## 2017-10-25 NOTE — Telephone Encounter (Signed)
Copied from [place]. Topic: Quick Communication - See Telephone Encounter >> Oct 25, 2017  9:18 AM Oneta Rack wrote: CRM for notification. See Telephone encounter for:   10/25/17.  Relation to pt: self Call back number: [phone]    Reason for call:  Due to starting a new job within [employer] requesting vaccination records (and ensure vaccinations are updated , flu shot and quantiferon tb gold (lab), patient would like to schedule nurse visit and lab draw today due to time being sensitive,  due by Wednesday, please advise

## 2017-10-25 NOTE — Telephone Encounter (Signed)
Ok to place order for test? Please advise.

## 2017-10-25 NOTE — Telephone Encounter (Signed)
Ok

## 2017-10-25 NOTE — Telephone Encounter (Signed)
Please advise 

## 2017-10-25 NOTE — Telephone Encounter (Signed)
Order has been placed for lab. Patient needs nurse visit for flu shot and a lab appointment for tb test

## 2017-10-26 ENCOUNTER — Ambulatory Visit
Admission: RE | Admit: 2017-10-26 | Discharge: 2017-10-26 | Disposition: A | Discharge status: Home / Self Care | Payer: 59 | Source: Ambulatory Visit | Attending: Urology | Admitting: Urology

## 2017-10-26 DIAGNOSIS — N200 Calculus of kidney: Secondary | ICD-10-CM | POA: Insufficient documentation

## 2017-10-28 DIAGNOSIS — R1013 Epigastric pain: Secondary | ICD-10-CM | POA: Diagnosis not present

## 2017-10-28 DIAGNOSIS — R1310 Dysphagia, unspecified: Secondary | ICD-10-CM | POA: Diagnosis not present

## 2017-10-28 DIAGNOSIS — K668 Other specified disorders of peritoneum: Secondary | ICD-10-CM | POA: Diagnosis not present

## 2017-10-28 DIAGNOSIS — Z9884 Bariatric surgery status: Secondary | ICD-10-CM | POA: Diagnosis not present

## 2017-10-28 DIAGNOSIS — K458 Other specified abdominal hernia without obstruction or gangrene: Secondary | ICD-10-CM | POA: Diagnosis not present

## 2017-10-28 DIAGNOSIS — K449 Diaphragmatic hernia without obstruction or gangrene: Secondary | ICD-10-CM | POA: Diagnosis not present

## 2017-10-28 HISTORY — PX: OTHER SURGICAL HISTORY: SHX169

## 2017-10-29 ENCOUNTER — Encounter: Payer: Self-pay | Admitting: Family

## 2017-11-01 ENCOUNTER — Encounter: Payer: Self-pay | Admitting: *Deleted

## 2017-11-01 NOTE — Telephone Encounter (Signed)
Sent mychart message for pt to est care with PCP. Pt did not show for appt with [name]. I also mentioned that Dr. Bernardo Heater can refill this medication until she is seen.

## 2017-11-03 ENCOUNTER — Ambulatory Visit: Payer: 59 | Admitting: Urology

## 2017-11-04 ENCOUNTER — Encounter: Payer: Self-pay | Admitting: Urology

## 2017-11-04 ENCOUNTER — Ambulatory Visit (INDEPENDENT_AMBULATORY_CARE_PROVIDER_SITE_OTHER): Payer: 59 | Admitting: Urology

## 2017-11-04 VITALS — BP 158/85 | HR 60 | Ht 68.0 in | Wt 173.1 lb

## 2017-11-04 DIAGNOSIS — N200 Calculus of kidney: Secondary | ICD-10-CM

## 2017-11-04 NOTE — Progress Notes (Signed)
11/04/2017 5:50 PM   [name] [birth year] [id number]  Referring provider: [name], FNP [address] [place], [place] [address]  Chief Complaint  Patient presents with  . Follow-up    HPI: 51 year old female presents for follow-up of nephrolithiasis.  She underwent right ureteroscopy with calculus removal 09/10/2017.  She has a history of recurrent stone disease.  She has had no problems since stent removal.  She was seen at the [facility] comprehensive stone clinic on 12/4 and is going to be entered in a study.  Follow-up renal ultrasound showed no hydronephrosis.  She was felt to have a 6 mm right lower pole renal calculus.  She has no complaints today.   PMH: Past Medical History:  Diagnosis Date  . Cancer ([place])    skin cancer on nose  . Diabetes mellitus without complication ([place])    resolved since gastric bypass in 2017-NO MEDS  . Hyperlipidemia   . Hypertension   . Kidney stone   . Migraine    Has used imitrex-last migraine in 2013  . MRSA carrier   . Sleep apnea    had gastric bypass surgery in 2017 and no longer needs CPAP    Surgical History: Past Surgical History:  Procedure Laterality Date  . BARIATRIC SURGERY  2017  . BREAST BIOPSY Right 2017   FIBROCYSTIC CHANGES WITH CALCIFICATIONS  . CESAREAN SECTION    . CHOLECYSTECTOMY  [id number]  . CYSTOSCOPY WITH STENT PLACEMENT Right 09/10/2017   Procedure: CYSTOSCOPY WITH STENT PLACEMENT;  Surgeon: Abbie Sons, MD;  Location: [facility] ORS;  Service: Urology;  Laterality: Right;  . TONSILLECTOMY AND ADENOIDECTOMY    . URETEROSCOPY WITH HOLMIUM LASER LITHOTRIPSY Right 09/10/2017   Procedure: URETEROSCOPY WITH HOLMIUM LASER LITHOTRIPSY;  Surgeon: Abbie Sons, MD;  Location: [facility] ORS;  Service: Urology;  Laterality: Right;    Home Medications:  Allergies as of 11/04/2017      Reactions   Latex Shortness Of Breath   Reaction:  SOB, coughing   Other Shortness Of Breath   Reaction:   SOB, coughing   Tolmetin Hives   Levemir [insulin Detemir]    Hives   Nsaids Hives      Medication List        Accurate as of 11/04/17  5:50 PM. Always use your most recent med list.          BARIATRIC FUSION Chew Chew 2 each by mouth daily.   calcium-vitamin D 500-200 MG-UNIT Tabs tablet Commonly known as:  OSCAL WITH D Take 4 tablets by mouth daily.   FLUoxetine 20 MG capsule Commonly known as:  PROZAC Take 1 capsule (20 mg total) by mouth every morning.   glucose blood test strip Commonly known as:  ONE TOUCH ULTRA TEST CHECK SUGAR 4 TIMES DAILY   ibuprofen 200 MG tablet Commonly known as:  ADVIL,MOTRIN Take 600 mg by mouth every 8 (eight) hours as needed for mild pain.   lisinopril 5 MG tablet Commonly known as:  PRINIVIL,ZESTRIL Take 1 tablet (5 mg total) by mouth daily.   oxybutynin 5 MG tablet Commonly known as:  DITROPAN Take 1 tablet (5 mg total) by mouth every 8 (eight) hours as needed for bladder spasms.   vitamin A 25000 UNIT capsule Take 25,000 Units by mouth daily.   Vitamin D3 5000 units Tabs Take by mouth.       Allergies:  Allergies  Allergen Reactions  . Latex Shortness Of Breath  Reaction:  SOB, coughing  . Other Shortness Of Breath    Reaction:  SOB, coughing  . Tolmetin Hives  . Levemir [Insulin Detemir]     Hives  . Nsaids Hives    Family History: Family History  Problem Relation Age of Onset  . Alcohol abuse Mother   . Hyperlipidemia Mother   . Hypertension Mother   . Hyperlipidemia Father   . Hypertension Father   . Parkinson's disease Father   . Drug abuse Sister   . Cancer Maternal Grandmother        breast cancer  . Breast cancer Maternal Grandmother   . Cancer Paternal Grandmother   . Kidney cancer Neg Hx   . Prostate cancer Neg Hx     Social History:  reports that  has never smoked. she has never used smokeless tobacco. She reports that she does not drink alcohol or use drugs.  ROS: UROLOGY Frequent  Urination?: No Hard to postpone urination?: No Burning/pain with urination?: No Get up at night to urinate?: No Leakage of urine?: No Urine stream starts and stops?: No Trouble starting stream?: No Do you have to strain to urinate?: Yes Blood in urine?: No Urinary tract infection?: No Sexually transmitted disease?: No Injury to kidneys or bladder?: No Painful intercourse?: No Weak stream?: No Currently pregnant?: No Vaginal bleeding?: No Last menstrual period?: n  Gastrointestinal Nausea?: No Vomiting?: No Indigestion/heartburn?: No Diarrhea?: No Constipation?: No  Constitutional Fever: No Night sweats?: No Weight loss?: No Fatigue?: No  Skin Skin rash/lesions?: No Itching?: No  Eyes Blurred vision?: No Double vision?: No  Ears/Nose/Throat Sore throat?: No Sinus problems?: No  Hematologic/Lymphatic Swollen glands?: No Easy bruising?: No  Cardiovascular Leg swelling?: No Chest pain?: No  Respiratory Cough?: No Shortness of breath?: No  Endocrine Excessive thirst?: No  Musculoskeletal Back pain?: No Joint pain?: No  Neurological Headaches?: No Dizziness?: No  Psychologic Depression?: No Anxiety?: No  Physical Exam: BP (!) 158/85   Pulse 60   Ht 5\' 8"  (1.727 m)   Wt 173 lb 1.6 oz (78.5 kg)   LMP 07/11/2016   BMI 26.32 kg/m   Constitutional:  Alert and oriented, No acute distress. HEENT: [place] AT, moist mucus membranes.  Trachea midline, no masses. Cardiovascular: No clubbing, cyanosis, or edema. Respiratory: Normal respiratory effort, no increased work of breathing. GI: Abdomen is soft, nontender, nondistended, no abdominal masses GU: No CVA tenderness.   Skin: No rashes, bruises or suspicious lesions. Lymph: No cervical or inguinal adenopathy. Neurologic: Grossly intact, no focal deficits, moving all 4 extremities. Psychiatric: Normal mood and affect.  Laboratory Data: Lab Results  Component Value Date   WBC 4.3 09/22/2016   HGB  13.2 09/22/2016   HCT 39.5 09/22/2016   MCV 86 09/22/2016   PLT 176 09/22/2016    Lab Results  Component Value Date   CREATININE 0.57 07/06/2017    Lab Results  Component Value Date   HGBA1C 8.8 (H) 02/04/2016     Pertinent Imaging:  Results for orders placed in visit on 07/02/17  DG Abd 1 View   Narrative CLINICAL DATA:  Gross hematuria with flank pain  EXAM: ABDOMEN - 1 VIEW  COMPARISON:  CT abdomen and pelvis September 21, 2016.  FINDINGS: There is a tiny presumed phlebolith in the lower right pelvis. There is no appreciable renal or ureteral calculus.  There is fairly diffuse stool throughout the colon. There is no bowel dilatation or air-fluid level to suggest bowel obstruction. No free  air. There are postoperative changes in the left and right upper and mid abdominal regions. Lung bases are clear.  IMPRESSION: No demonstrable renal or ureteral calculi by radiography. Fairly diffuse stool throughout colon. Postoperative change bilaterally. No bowel obstruction or free air evident.   Electronically Signed   By: Lowella Grip III M.D.   On: 07/02/2017 14:12     Results for orders placed during the hospital encounter of 10/26/17  Ultrasound renal complete   Narrative CLINICAL DATA:  History of kidney stones. Status post right ureteroscopy and lithotripsy.  EXAM: RENAL / URINARY TRACT ULTRASOUND COMPLETE  COMPARISON:  CT, 09/21/2016  FINDINGS: Right Kidney:  Length: 12.2 cm. Small shadowing calculus in the lower pole measuring 6 mm. No renal masses. No other stones. No hydronephrosis.  Left Kidney:  Length: 12.8 cm. Echogenicity within normal limits. No mass or hydronephrosis visualized.  Bladder:  Appears normal for degree of bladder distention. Bilateral ureteral jets visualized.  IMPRESSION: 1. No acute findings.  No hydronephrosis. 2. 6 mm nonobstructing stone in the lower pole of the right kidney. No other visualized intrarenal  stones.   Electronically Signed   By: Lajean Manes M.D.   On: 10/26/2017 20:42     Results for orders placed in visit on 09/21/16  CT RENAL STONE STUDY   Narrative CLINICAL DATA:  Right flank pain.  History of kidney stones.  EXAM: CT ABDOMEN AND PELVIS WITHOUT CONTRAST  TECHNIQUE: Multidetector CT imaging of the abdomen and pelvis was performed following the standard protocol without IV contrast.  COMPARISON:  CT abdomen pelvis 02/07/2012  FINDINGS: Lower chest: Lung bases clear.  Hepatobiliary: Normal liver. Distended gallbladder which has increased density compared to usual. No gallbladder wall thickening or pericholecystic edema. No calcified gallstones. Bile ducts nondilated  Pancreas: Negative  Spleen: Negative  Adrenals/Urinary Tract: 2 mm nonobstructive right renal calculus. No left renal calculi. No renal obstruction or mass. Phleboliths in the retroperitoneum on the right are outside of the ureter.  Stomach/Bowel: Gastric bypass. Negative for bowel obstruction. No bowel mass or edema.  Vascular/Lymphatic: Negative  Reproductive: Normal uterus.  No pelvic mass.  Other: Negative for free fluid  Musculoskeletal: No acute skeletal abnormality.  IMPRESSION: 2 mm right lower pole stone.  No renal obstruction.  Distended gallbladder. Increased density of bile in the gallbladder. No pericholecystic edema or gallbladder wall thickening. Correlate with physical examination this area. Consider gallbladder ultrasound to rule out cholecystitis.  These results will be called to the ordering clinician or representative by the Radiologist Assistant, and communication documented in the PACS or zVision Dashboard.   Electronically Signed   By: Franchot Gallo M.D.   On: 09/21/2016 16:52     Assessment & Plan:    1. Nephrolithiasis She has a follow-up appointment scheduled in the comprehensive stone clinic at [facility].  She will follow-up with me in 6 months  with a KUB and was instructed to call earlier for any significant change.   Return in about 6 months (around 05/05/2018) for KUB.    Abbie Sons, [place] [address] [place], [place] [address] [phone]

## 2017-11-08 ENCOUNTER — Ambulatory Visit: Payer: 59 | Admitting: Urology

## 2017-12-10 ENCOUNTER — Encounter: Payer: Self-pay | Admitting: Family

## 2017-12-22 NOTE — Telephone Encounter (Signed)
Spoke with patient and she has already done titer through [facility]

## 2018-01-27 ENCOUNTER — Encounter (HOSPITAL_COMMUNITY): Payer: Self-pay | Admitting: Family Medicine

## 2018-01-27 ENCOUNTER — Ambulatory Visit (HOSPITAL_COMMUNITY)
Admission: EM | Admit: 2018-01-27 | Discharge: 2018-01-27 | Disposition: A | Discharge status: Home / Self Care | Payer: Managed Care, Other (non HMO) | Attending: Family Medicine | Admitting: Family Medicine

## 2018-01-27 DIAGNOSIS — J988 Other specified respiratory disorders: Secondary | ICD-10-CM | POA: Diagnosis not present

## 2018-01-27 DIAGNOSIS — B9789 Other viral agents as the cause of diseases classified elsewhere: Secondary | ICD-10-CM

## 2018-01-27 MED ORDER — OSELTAMIVIR PHOSPHATE 75 MG PO CAPS: 75.0000 mg | ORAL_CAPSULE | Freq: Two times a day (BID) | ORAL | 0 refills | Status: AC

## 2018-01-27 NOTE — ED Provider Notes (Signed)
[place]   [id number] 01/27/18 Arrival Time: 5852  ASSESSMENT & PLAN:  1. Viral respiratory illness     Meds ordered this encounter  Medications  . oseltamivir (TAMIFLU) 75 MG capsule    Sig: Take 1 capsule (75 mg total) by mouth 2 (two) times daily for 5 days.    Dispense:  10 capsule    Refill:  0   Question early influenza. She prefers to start Tamiflu. Work note given. Discussed typical duration of symptoms. OTC symptom care as needed. Ensure adequate fluid intake and rest. May f/u with PCP or here as needed.  Reviewed expectations re: course of current medical issues. Questions answered. Outlined signs and symptoms indicating need for more acute intervention. Patient verbalized understanding. After Visit Summary given.   SUBJECTIVE: History from: patient.  [name] is a 52 y.o. female who presents with complaint of nasal congestion, post-nasal drainage, and body aches. Onset abrupt, approximately 1 day ago. Overall fatigued. SOB: none. Wheezing: none. Fever: yes, subjective. Overall normal PO intake without n/v. Sick contacts: yes, co-workers. OTC treatment: Tylenol with mild help.  Received flu shot this year: yes.  Social History   Tobacco Use  Smoking Status Never Smoker  Smokeless Tobacco Never Used    ROS: As per HPI.   OBJECTIVE:  Vitals:   01/27/18 1013  BP: (!) 171/91  Pulse: (!) 57  Resp: 18  Temp: 97.9 F (36.6 C)  SpO2: 98%     General appearance: alert; appears fatigued HEENT: nasal congestion; clear runny nose; throat irritation secondary to post-nasal drainage Neck: supple without LAD Lungs: unlabored respirations, symmetrical air entry; cough: mild; no respiratory distress Skin: warm and dry Psychological: alert and cooperative; normal mood and affect   Allergies  Allergen Reactions  . Latex Shortness Of Breath    Reaction:  SOB, coughing  . Other Shortness Of Breath    Reaction:  SOB, coughing  .  Tolmetin Hives  . Levemir [Insulin Detemir]     Hives  . Nsaids Hives    Past Medical History:  Diagnosis Date  . Cancer ([place])    skin cancer on nose  . Diabetes mellitus without complication ([place])    resolved since gastric bypass in 2017-NO MEDS  . Hyperlipidemia   . Hypertension   . Kidney stone   . Migraine    Has used imitrex-last migraine in 2013  . MRSA carrier   . Sleep apnea    had gastric bypass surgery in 2017 and no longer needs CPAP   Family History  Problem Relation Age of Onset  . Alcohol abuse Mother   . Hyperlipidemia Mother   . Hypertension Mother   . Hyperlipidemia Father   . Hypertension Father   . Parkinson's disease Father   . Drug abuse Sister   . Cancer Maternal Grandmother        breast cancer  . Breast cancer Maternal Grandmother   . Cancer Paternal Grandmother   . Kidney cancer Neg Hx   . Prostate cancer Neg Hx    Social History   Socioeconomic History  . Marital status: Single    Spouse name: Not on file  . Number of children: 2  . Years of education: 67  . Highest education level: Not on file  Occupational History  . Occupation: Mudlogger: [employer]  . 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 Occupation: Engineer, materials  . Occupation:  [employer]    Comment: Microbiology - Weekend  Social Needs  . Financial resource strain: Not on file  . Food insecurity:    Worry: Not on file    Inability: Not on file  . Transportation needs:    Medical: Not on file    Non-medical: Not on file  Tobacco Use  . Smoking status: Never Smoker  . Smokeless tobacco: Never Used  Substance and Sexual Activity  . Alcohol use: No    Alcohol/week: 0.0 oz  . Drug use: No  . Sexual activity: Not on file  Lifestyle  . Physical activity:    Days per week: Not on file    Minutes per session: Not on file  . Stress: Not on file  Relationships  . Social connections:    Talks on phone: Not on file     Gets together: Not on file    Attends religious service: Not on file    Active member of club or organization: Not on file    Attends meetings of clubs or organizations: Not on file    Relationship status: Not on file  . Intimate partner violence:    Fear of current or ex partner: Not on file    Emotionally abused: Not on file    Physically abused: Not on file    Forced sexual activity: Not on file  Other Topics Concern  . Not on file  Social History Narrative   [name] grew up partly in [place] and then [place]. She lives in [place] with her two daughter. 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 She enjoys the outdoors, gardening.            Vanessa Kick, MD 01/27/18 1040

## 2018-01-27 NOTE — ED Triage Notes (Signed)
Pt here for headache and fever since yesterday . She is taking tylenol and motrin. reports that co workers have had the flu.

## 2018-01-27 NOTE — Discharge Instructions (Addendum)

## 2018-03-04 ENCOUNTER — Ambulatory Visit
Admission: RE | Admit: 2018-03-04 | Discharge: 2018-03-04 | Disposition: A | Discharge status: Home / Self Care | Payer: Managed Care, Other (non HMO) | Source: Ambulatory Visit | Attending: Urology | Admitting: Urology

## 2018-03-04 ENCOUNTER — Telehealth: Payer: Self-pay

## 2018-03-04 ENCOUNTER — Ambulatory Visit (INDEPENDENT_AMBULATORY_CARE_PROVIDER_SITE_OTHER): Payer: Managed Care, Other (non HMO)

## 2018-03-04 VITALS — BP 155/87 | HR 75 | Ht 68.0 in | Wt 166.0 lb

## 2018-03-04 DIAGNOSIS — N200 Calculus of kidney: Secondary | ICD-10-CM

## 2018-03-04 DIAGNOSIS — Z87442 Personal history of urinary calculi: Secondary | ICD-10-CM | POA: Insufficient documentation

## 2018-03-04 DIAGNOSIS — R93421 Abnormal radiologic findings on diagnostic imaging of right kidney: Secondary | ICD-10-CM | POA: Insufficient documentation

## 2018-03-04 LAB — URINALYSIS, COMPLETE
Bilirubin, UA: NEGATIVE
Glucose, UA: NEGATIVE
Ketones, UA: NEGATIVE
Nitrite, UA: NEGATIVE
Protein, UA: NEGATIVE
Specific Gravity, UA: 1.015 (ref 1.005–1.030)
Urobilinogen, Ur: 0.2 mg/dL (ref 0.2–1.0)
pH, UA: 5.5 (ref 5.0–7.5)

## 2018-03-04 LAB — MICROSCOPIC EXAMINATION

## 2018-03-04 NOTE — Telephone Encounter (Signed)
Pt called stating she feels like she might be in renal colic. Pt stated that she is having n/v, chills, cold sweats, 10/10 pain, and lower abd pain. Pt states that when shes not in an attack her pain is 5/10, nausea, and chills. Per Dr. Bernardo Heater pt should get a KUB and added to the nurse schedule for a u/a. Pt voiced understanding. Pt was added on for today.

## 2018-03-04 NOTE — Progress Notes (Signed)
Pt presented to give a clean catch for u/a and KUB results. KUB was not resulted in time for pt to get results. Therefore will call pt with results. Reinforced with pt to seek at the ER if fever 101 or greater, uncontrollable vomiting, and/or uncontrolled pain. Pt voiced understanding.   Blood pressure (!) 155/87, pulse 75, height 5\' 8"  (1.727 m), weight 166 lb (75.3 kg), last menstrual period 07/11/2016.

## 2018-03-09 ENCOUNTER — Telehealth: Payer: Self-pay

## 2018-03-09 MED ORDER — TAMSULOSIN HCL 0.4 MG PO CAPS: 0.4000 mg | ORAL_CAPSULE | Freq: Every day | ORAL | 0 refills | Status: DC

## 2018-03-09 NOTE — Telephone Encounter (Signed)
Patient notified and script sent 

## 2018-03-09 NOTE — Telephone Encounter (Signed)
-----   Message from Abbie Sons, MD sent at 03/09/2018  7:18 AM EDT ----- KUB was reviewed.  She may have bilateral nonobstructing renal calculi.  I do not see a definite ureteral calculus.  Can send in Rx tamsulosin.  For persistent or increasing pain would recommend stone protocol CT.

## 2018-03-18 ENCOUNTER — Ambulatory Visit: Payer: Managed Care, Other (non HMO) | Admitting: Family

## 2018-03-23 ENCOUNTER — Other Ambulatory Visit: Payer: Self-pay | Admitting: Urology

## 2018-04-05 ENCOUNTER — Encounter: Payer: Self-pay | Admitting: Family

## 2018-04-06 ENCOUNTER — Other Ambulatory Visit: Payer: Self-pay | Admitting: Family

## 2018-04-06 DIAGNOSIS — F339 Major depressive disorder, recurrent, unspecified: Secondary | ICD-10-CM

## 2018-04-06 MED ORDER — FLUOXETINE HCL 40 MG PO CAPS: 40.0000 mg | ORAL_CAPSULE | Freq: Every morning | ORAL | 1 refills | Status: DC

## 2018-04-06 NOTE — Progress Notes (Signed)
close

## 2018-04-21 ENCOUNTER — Other Ambulatory Visit: Payer: Self-pay | Admitting: Family

## 2018-04-21 DIAGNOSIS — I10 Essential (primary) hypertension: Secondary | ICD-10-CM

## 2018-04-21 DIAGNOSIS — F339 Major depressive disorder, recurrent, unspecified: Secondary | ICD-10-CM

## 2018-05-09 ENCOUNTER — Ambulatory Visit: Payer: 59 | Admitting: Urology

## 2018-05-31 ENCOUNTER — Telehealth: Payer: Self-pay | Admitting: Family Medicine

## 2018-05-31 ENCOUNTER — Ambulatory Visit
Admission: RE | Admit: 2018-05-31 | Discharge: 2018-05-31 | Disposition: A | Discharge status: Home / Self Care | Payer: Managed Care, Other (non HMO) | Source: Ambulatory Visit | Attending: Urology | Admitting: Urology

## 2018-05-31 ENCOUNTER — Encounter: Payer: Self-pay | Admitting: Urology

## 2018-05-31 ENCOUNTER — Ambulatory Visit (INDEPENDENT_AMBULATORY_CARE_PROVIDER_SITE_OTHER): Payer: Managed Care, Other (non HMO) | Admitting: Family Medicine

## 2018-05-31 ENCOUNTER — Other Ambulatory Visit: Payer: Self-pay

## 2018-05-31 VITALS — BP 143/88 | HR 60 | Ht 68.0 in | Wt 185.8 lb

## 2018-05-31 DIAGNOSIS — N200 Calculus of kidney: Secondary | ICD-10-CM

## 2018-05-31 LAB — MICROSCOPIC EXAMINATION
Bacteria, UA: NONE SEEN
RBC, UA: NONE SEEN /hpf (ref 0–2)

## 2018-05-31 LAB — URINALYSIS, COMPLETE
Bilirubin, UA: NEGATIVE
Glucose, UA: NEGATIVE
Ketones, UA: NEGATIVE
Leukocytes, UA: NEGATIVE
Nitrite, UA: NEGATIVE
Protein, UA: NEGATIVE
RBC, UA: NEGATIVE
Specific Gravity, UA: 1.01 (ref 1.005–1.030)
Urobilinogen, Ur: 0.2 mg/dL (ref 0.2–1.0)
pH, UA: 6 (ref 5.0–7.5)

## 2018-05-31 NOTE — Telephone Encounter (Signed)
Patient has been added to the nurse schedule for today per Judson Roch. She will put in an order for a KUB and the patient will come in at 2:00.  Thanks, Sharyn Lull

## 2018-05-31 NOTE — Progress Notes (Signed)
Patient presents today with flank pain and nausea, vomiting. Her symptoms began yesterday and have gotten worse today. She has a history of kidney stones, a KUB was done. A urine was collected for UA, UCX. Patient is allergic to NSAIDS, Latex. She has not had any ABX or Urological surgeries in the last 30 days.

## 2018-05-31 NOTE — Telephone Encounter (Signed)
Spoke to patient and informed her the KUB and UA look ok. Urine was sent for Culture and we will contact her when the result comes in.

## 2018-06-02 LAB — CULTURE, URINE COMPREHENSIVE

## 2018-06-16 ENCOUNTER — Ambulatory Visit: Payer: Self-pay

## 2018-06-16 NOTE — Telephone Encounter (Signed)
Patient called in with c/o "dizziness and broken blood vessel in left eye." She says "I woke up this morning around 0745 and the room was spinning. I had to sit down. Finally it went away. I was driving and my daughter noticed my left eye had a broken blood vessel. I also had a headache this morning. Now I'm not dizzy, but it comes off and on." I asked about other symptoms, she says "I'm a little queasy." According to protocol, see PCP within 24 hours, no availability with PCP or any provider in the practice today. Patient says she is not available tomorrow due to she is taking her father to the [place]. I advised the patient to go to the UC or ED, she declined and says if she can't be seen today, she will just forget it. I advised that if she is still willing to be seen in the office, there is a Saturday Clinic at [facility] and she can call back after 1 pm tomorrow to schedule, she verbalized understanding.   Reason for Disposition . [1] MODERATE dizziness (e.g., vertigo; feels very unsteady, interferes with normal activities) AND [2] has NOT been evaluated by physician for this  Answer Assessment - Initial Assessment Questions 1. DESCRIPTION: "Describe your dizziness."     Room spinning 2. VERTIGO: "Do you feel like either you or the room is spinning or tilting?"      Yes 3. LIGHTHEADED: "Do you feel lightheaded?" (e.g., somewhat faint, woozy, weak upon standing)     Yes 4. SEVERITY: "How bad is it?"  "Can you walk?"   - MILD - Feels unsteady but walking normally.   - MODERATE - Feels very unsteady when walking, but not falling; interferes with normal activities (e.g., school, work) .   - SEVERE - Unable to walk without falling (requires assistance).     Mild 5. ONSET:  "When did the dizziness begin?"     Around 0745 today, off and on 6. AGGRAVATING FACTORS: "Does anything make it worse?" (e.g., standing, change in head position)     At the time, standing and walking 7. CAUSE: "What do you  think is causing the dizziness?"     I don't know 8. RECURRENT SYMPTOM: "Have you had dizziness before?" If so, ask: "When was the last time?" "What happened that time?"     No 9. OTHER SYMPTOMS: "Do you have any other symptoms?" (e.g., headache, weakness, numbness, vomiting, earache)     Headache, broken blood vessel in left eye, a little nauseated 10. PREGNANCY: "Is there any chance you are pregnant?" "When was your last menstrual period?"       No  Protocols used: DIZZINESS - VERTIGO-A-AH

## 2018-06-17 NOTE — Telephone Encounter (Signed)
Call pt  Please offer an appt with [place]- see below triage note

## 2018-06-17 NOTE — Telephone Encounter (Signed)
noted 

## 2018-06-17 NOTE — Telephone Encounter (Signed)
Called patient and offered appointment at another  office and she stated that she was unable to due to she was taking father to [place] .  She also stated that she works 7 days a work and doesn't have time to take care of herself . I strongly advised her that she needed to be seen due to condition could effect her eye. I told her that that she could go to urgent care . She stated if condition worsened she would go to urgent care or ED.

## 2018-06-19 ENCOUNTER — Encounter: Payer: Self-pay | Admitting: Emergency Medicine

## 2018-06-19 ENCOUNTER — Other Ambulatory Visit: Payer: Self-pay

## 2018-06-19 ENCOUNTER — Emergency Department
Admission: EM | Admit: 2018-06-19 | Discharge: 2018-06-19 | Disposition: A | Discharge status: Home / Self Care | Payer: Managed Care, Other (non HMO) | Attending: Emergency Medicine | Admitting: Emergency Medicine

## 2018-06-19 ENCOUNTER — Emergency Department: Payer: Managed Care, Other (non HMO)

## 2018-06-19 DIAGNOSIS — R300 Dysuria: Secondary | ICD-10-CM | POA: Diagnosis not present

## 2018-06-19 DIAGNOSIS — Z87442 Personal history of urinary calculi: Secondary | ICD-10-CM | POA: Insufficient documentation

## 2018-06-19 DIAGNOSIS — E119 Type 2 diabetes mellitus without complications: Secondary | ICD-10-CM | POA: Diagnosis not present

## 2018-06-19 DIAGNOSIS — Z91040 Latex allergy status: Secondary | ICD-10-CM | POA: Insufficient documentation

## 2018-06-19 DIAGNOSIS — M545 Low back pain, unspecified: Secondary | ICD-10-CM

## 2018-06-19 DIAGNOSIS — I10 Essential (primary) hypertension: Secondary | ICD-10-CM | POA: Diagnosis not present

## 2018-06-19 DIAGNOSIS — E785 Hyperlipidemia, unspecified: Secondary | ICD-10-CM | POA: Diagnosis not present

## 2018-06-19 DIAGNOSIS — Z79899 Other long term (current) drug therapy: Secondary | ICD-10-CM | POA: Diagnosis not present

## 2018-06-19 DIAGNOSIS — R110 Nausea: Secondary | ICD-10-CM | POA: Insufficient documentation

## 2018-06-19 LAB — CBC WITH DIFFERENTIAL/PLATELET
Basophils Absolute: 0 10*3/uL (ref 0–0.1)
Basophils Relative: 0 %
Eosinophils Absolute: 0.3 10*3/uL (ref 0–0.7)
Eosinophils Relative: 5 %
HCT: 35.5 % (ref 35.0–47.0)
Hemoglobin: 12.2 g/dL (ref 12.0–16.0)
Lymphocytes Relative: 36 %
Lymphs Abs: 2 10*3/uL (ref 1.0–3.6)
MCH: 30.6 pg (ref 26.0–34.0)
MCHC: 34.5 g/dL (ref 32.0–36.0)
MCV: 88.7 fL (ref 80.0–100.0)
Monocytes Absolute: 0.4 10*3/uL (ref 0.2–0.9)
Monocytes Relative: 7 %
Neutro Abs: 2.8 10*3/uL (ref 1.4–6.5)
Neutrophils Relative %: 52 %
Platelets: 168 10*3/uL (ref 150–440)
RBC: 4 MIL/uL (ref 3.80–5.20)
RDW: 13.8 % (ref 11.5–14.5)
WBC: 5.4 10*3/uL (ref 3.6–11.0)

## 2018-06-19 LAB — BASIC METABOLIC PANEL
Anion gap: 6 (ref 5–15)
BUN: 10 mg/dL (ref 6–20)
CO2: 27 mmol/L (ref 22–32)
Calcium: 8.4 mg/dL — ABNORMAL LOW (ref 8.9–10.3)
Chloride: 110 mmol/L (ref 98–111)
Creatinine, Ser: 0.64 mg/dL (ref 0.44–1.00)
GFR calc Af Amer: >60 mL/min (ref >60)
GFR calc non Af Amer: >60 mL/min (ref >60)
Glucose, Bld: 96 mg/dL (ref 70–99)
Potassium: 3.2 mmol/L — ABNORMAL LOW (ref 3.5–5.1)
Sodium: 143 mmol/L (ref 135–145)

## 2018-06-19 LAB — URINALYSIS, COMPLETE (UACMP) WITH MICROSCOPIC
Bacteria, UA: NONE SEEN
Bilirubin Urine: NEGATIVE
Glucose, UA: NEGATIVE mg/dL
Hgb urine dipstick: NEGATIVE
Ketones, ur: NEGATIVE mg/dL
Leukocytes, UA: NEGATIVE
Nitrite: NEGATIVE
Protein, ur: NEGATIVE mg/dL
Specific Gravity, Urine: 1.014 (ref 1.005–1.030)
pH: 5 (ref 5.0–8.0)

## 2018-06-19 MED ORDER — HYDROCODONE-ACETAMINOPHEN 5-325 MG PO TABS: 1.0000 | ORAL_TABLET | Freq: Four times a day (QID) | ORAL | 0 refills | Status: AC | PRN

## 2018-06-19 MED ORDER — HYDROCODONE-ACETAMINOPHEN 5-325 MG PO TABS
1.0000 | ORAL_TABLET | Freq: Once | ORAL | Status: AC
  Administered 2018-06-19: 1 via ORAL
  Filled 2018-06-19: qty 1

## 2018-06-19 NOTE — ED Provider Notes (Signed)
[facility] Emergency Department Provider Note ____________________________________________   First MD Initiated Contact with Patient 06/19/18 1829     (approximate)  I have reviewed the triage vital signs and the nursing notes.   HISTORY  Chief Complaint Back Pain    HPI [name] is a 52 y.o. female with PMH as noted below including prior history of kidney stones presents with bilateral mid to lower back pain over the last few weeks, but worse in the last few days.  It is intermittent and worse with certain positions.  She has no history of trauma.  She denies any hematuria, dysuria, or other urinary symptoms.  She denies fevers or vomiting.  She states that the pain does not really radiate and she has no abdominal pain.  She also denies numbness or weakness in the lower extremities.   Past Medical History:  Diagnosis Date  . Cancer ([place])    skin cancer on nose  . Diabetes mellitus without complication ([place])    resolved since gastric bypass in 2017-NO MEDS  . Hyperlipidemia   . Hypertension   . Kidney stone   . Migraine    Has used imitrex-last migraine in 2013  . MRSA carrier   . Sleep apnea    had gastric bypass surgery in 2017 and no longer needs CPAP    Patient Active Problem List   Diagnosis Date Noted  . Right ureteral calculus 08/31/2017  . Hydronephrosis with urinary obstruction due to ureteral calculus 08/31/2017  . Nephrolithiasis 08/31/2017  . Screen for colon cancer 07/26/2017  . Depression, recurrent ([place]) 06/22/2017  . Gross hematuria 11/03/2016  . Sleep apnea 10/06/2016  . Hyperlipidemia 09/21/2016  . Candidal intertrigo 09/21/2016  . H/O gastric bypass 09/21/2016  . Nocturnal leg cramps 01/21/2015  . Elevated liver enzymes 09/04/2014  . Vitamin D deficiency 09/03/2014  . Encounter for routine gynecological examination 06/18/2014  . Diabetes type 2, uncontrolled ([place]) 01/22/2014  . Routine general medical  examination at a health care facility 01/11/2014  . Numbness and tingling of both legs 01/11/2014  . Knee pain, bilateral 01/11/2014  . Obesity 01/11/2014  . HTN (hypertension) 01/11/2014    Past Surgical History:  Procedure Laterality Date  . BARIATRIC SURGERY  2017  . BREAST BIOPSY Right 2017   FIBROCYSTIC CHANGES WITH CALCIFICATIONS  . CESAREAN SECTION    . CHOLECYSTECTOMY  [id number]  . CYSTOSCOPY WITH STENT PLACEMENT Right 09/10/2017   Procedure: CYSTOSCOPY WITH STENT PLACEMENT;  Surgeon: Abbie Sons, MD;  Location: [facility] ORS;  Service: Urology;  Laterality: Right;  . TONSILLECTOMY AND ADENOIDECTOMY    . URETEROSCOPY WITH HOLMIUM LASER LITHOTRIPSY Right 09/10/2017   Procedure: URETEROSCOPY WITH HOLMIUM LASER LITHOTRIPSY;  Surgeon: Abbie Sons, MD;  Location: [facility] ORS;  Service: Urology;  Laterality: Right;    Prior to Admission medications   Medication Sig Start Date End Date Taking? Authorizing Provider  calcium-vitamin D (OSCAL WITH D) 500-200 MG-UNIT TABS tablet Take 2 tablets by mouth daily.    Yes [provider]  Cholecalciferol (VITAMIN D3) 5000 units TABS Take 1 tablet by mouth daily.    Yes [provider]  FLUoxetine (PROZAC) 20 MG capsule TAKE 1 CAPSULE BY MOUTH EVERY DAY IN THE MORNING 04/22/18  Yes [name], Yvetta Coder, FNP  lisinopril (PRINIVIL,ZESTRIL) 5 MG tablet TAKE 1 TABLET BY MOUTH EVERY DAY 04/22/18  Yes [name], Yvetta Coder, FNP  Multiple Vitamins-Minerals (BARIATRIC FUSION) CHEW Chew 1 each by mouth daily.  Yes [provider]  tamsulosin (FLOMAX) 0.4 MG CAPS capsule TAKE 1 CAPSULE BY MOUTH EVERY DAY 03/23/18  Yes Stoioff, Ronda Fairly, MD  vitamin A 25000 UNIT capsule Take 25,000 Units by mouth daily.   Yes [provider]  FLUoxetine (PROZAC) 40 MG capsule Take 1 capsule (40 mg total) by mouth every morning. Patient not taking: Reported on 06/19/2018 04/06/18   [name], FNP  glucose blood (ONE TOUCH ULTRA TEST) test  strip CHECK SUGAR 4 TIMES DAILY 04/19/17   [name], FNP  HYDROcodone-acetaminophen (NORCO/VICODIN) 5-325 MG tablet Take 1 tablet by mouth every 6 (six) hours as needed for up to 3 days for severe pain. 06/19/18 06/22/18  Arta Silence, MD  oxybutynin (DITROPAN) 5 MG tablet Take 1 tablet (5 mg total) by mouth every 8 (eight) hours as needed for bladder spasms. Patient not taking: Reported on 06/19/2018 09/10/17   Abbie Sons, MD    Allergies Latex; Other; Tolmetin; Levemir [insulin detemir]; and Nsaids  Family History  Problem Relation Age of Onset  . Alcohol abuse Mother   . Hyperlipidemia Mother   . Hypertension Mother   . Hyperlipidemia Father   . Hypertension Father   . Parkinson's disease Father   . Drug abuse Sister   . Cancer Maternal Grandmother        breast cancer  . Breast cancer Maternal Grandmother   . Cancer Paternal Grandmother   . Kidney cancer Neg Hx   . Prostate cancer Neg Hx     Social History Social History   Tobacco Use  . Smoking status: Never Smoker  . Smokeless tobacco: Never Used  Substance Use Topics  . Alcohol use: No    Alcohol/week: 0.0 standard drinks  . Drug use: No    Review of Systems  Constitutional: No fever. Eyes: No visual changes. ENT: No neck pain. Cardiovascular: Denies chest pain. Respiratory: Denies shortness of breath. Gastrointestinal: Positive for nausea.  No vomiting or diarrhea.  Genitourinary: Negative for dysuria or hematuria.  Musculoskeletal: Positive for back pain. Skin: Negative for rash. Neurological: Negative for focal weakness or numbness.   ____________________________________________   PHYSICAL EXAM:  VITAL SIGNS: ED Triage Vitals  Enc Vitals Group     BP 06/19/18 1721 (!) 168/81     Pulse Rate 06/19/18 1721 63     Resp 06/19/18 1721 16     Temp 06/19/18 1721 98.8 F (37.1 C)     Temp Source 06/19/18 1721 Oral     SpO2 06/19/18 1721 99 %     Weight 06/19/18 1722 182 lb (82.6  kg)     Height 06/19/18 1722 5\' 8"  (1.727 m)     Head Circumference --      Peak Flow --      Pain Score 06/19/18 1721 8     Pain Loc --      Pain Edu? --      Excl. in [place]? --     Constitutional: Alert and oriented. Well appearing and in no acute distress. Eyes: Conjunctivae are normal.  Head: Atraumatic. Nose: No congestion/rhinnorhea. Mouth/Throat: Mucous membranes are moist.   Neck: Normal range of motion.  Cardiovascular:  Good peripheral circulation. Respiratory: Normal respiratory effort.  Gastrointestinal: Soft and nontender. No distention.  Genitourinary: Mild left CVA tenderness. Musculoskeletal: No lower extremity edema.  Extremities warm and well perfused.  No midline spinal tenderness.  Mild bilateral paraspinal muscle tenderness to the lower thoracic/upper lumbar area. Neurologic: 5/5 motor  strength and intact sensation of bilateral lower extremities. Skin:  Skin is warm and dry. No rash noted. Psychiatric: Mood and affect are normal. Speech and behavior are normal.  ____________________________________________   LABS (all labs ordered are listed, but only abnormal results are displayed)  Labs Reviewed  BASIC METABOLIC PANEL - Abnormal; Notable for the following components:      Result Value   Potassium 3.2 (*)    Calcium 8.4 (*)    All other components within normal limits  URINALYSIS, COMPLETE (UACMP) WITH MICROSCOPIC - Abnormal; Notable for the following components:   Color, Urine YELLOW (*)    APPearance HAZY (*)    All other components within normal limits  CBC WITH DIFFERENTIAL/PLATELET   ____________________________________________  EKG   ____________________________________________  RADIOLOGY  CT abdomen: Mild right hydronephrosis  ____________________________________________   PROCEDURES  Procedure(s) performed: No  Procedures  Critical Care performed: No ____________________________________________   INITIAL IMPRESSION /  ASSESSMENT AND PLAN / ED COURSE  Pertinent labs & imaging results that were available during my care of the patient were reviewed by me and considered in my medical decision making (see chart for details).  52 year old female with PMH as noted above presents with bilateral mid to lower back pain over the last few weeks intermittently, and worsening in the last several days.  She denies any fever or urinary symptoms.  She does report a prior history of kidney stones.  On exam, the patient is well-appearing.  She has a low-grade fever and likely corresponding tachycardia, but her vital signs are otherwise normal.  The remainder of the exam is as described above.  Initial labs and UA obtained from triage are unremarkable except for RBCs on the UA.  Differential includes myalgias from viral syndrome given the presence of fever, kidney stone, muscular back pain/spasm, or likely other benign cause.  There is no evidence of UTI/pyelonephritis given the UA findings.  We will obtain a CT renal stone study and reassess.  ----------------------------------------- 9:01 PM on 06/19/2018 -----------------------------------------  CT shows mild right hydronephrosis and hydroureter, suggesting recently passed ureteral stone.  The patient had an incorrect set of vital signs documented (which I refer to above) that showed a fever, however it turns out that she never had a fever or tachycardia.  Therefore, based on the normal vital signs and the lack of findings on the UA, there is no evidence of UTI/pyelonephritis.  I suspect most likely pain from recently passed stone versus musculoskeletal back pain.  I counseled the patient on the results of the work-up.  She feels comfortable and like to go home.  Return precautions given, and she expresses understanding. ____________________________________________   FINAL CLINICAL IMPRESSION(S) / ED DIAGNOSES  Final diagnoses:  Acute bilateral low back pain without  sciatica      NEW MEDICATIONS STARTED DURING THIS VISIT:  New Prescriptions   HYDROCODONE-ACETAMINOPHEN (NORCO/VICODIN) 5-325 MG TABLET    Take 1 tablet by mouth every 6 (six) hours as needed for up to 3 days for severe pain.     Note:  This document was prepared using Dragon voice recognition software and may include unintentional dictation errors.    Arta Silence, MD 06/19/18 2102

## 2018-06-19 NOTE — ED Triage Notes (Signed)
Pt to ED via POV c/o back pain, nausea, abd trouble urinating. Pt states that she feels like she has to bear down to start urine stream. Pt states that she has had pain on and off x 1 month but has been worse over the past few days. Pt is in NAD at this time.

## 2018-06-19 NOTE — Discharge Instructions (Addendum)
Continue to take the ibuprofen and tramadol as needed for pain.  You may take the prescribed Vicodin if needed for more severe pain over the next few days, but do not take it at the same time as you are taking tramadol.  Return to the ER for new, worsening, persistent severe back pain, weakness or numbness, fevers, vomiting, or any other new or worsening symptoms that concern you.  Follow-up with your regular doctor.

## 2018-10-03 ENCOUNTER — Encounter: Payer: Managed Care, Other (non HMO) | Admitting: Family

## 2018-11-25 ENCOUNTER — Ambulatory Visit
Admission: RE | Admit: 2018-11-25 | Discharge: 2018-11-25 | Disposition: A | Discharge status: Home / Self Care | Payer: Managed Care, Other (non HMO) | Attending: Urology | Admitting: Urology

## 2018-11-25 ENCOUNTER — Encounter: Payer: Self-pay | Admitting: Urology

## 2018-11-25 ENCOUNTER — Ambulatory Visit: Payer: Managed Care, Other (non HMO) | Admitting: Urology

## 2018-11-25 ENCOUNTER — Telehealth: Payer: Self-pay

## 2018-11-25 ENCOUNTER — Ambulatory Visit
Admission: RE | Admit: 2018-11-25 | Discharge: 2018-11-25 | Disposition: A | Discharge status: Home / Self Care | Payer: Managed Care, Other (non HMO) | Source: Ambulatory Visit | Attending: Urology | Admitting: Urology

## 2018-11-25 VITALS — BP 134/72 | HR 70 | Ht 68.0 in | Wt 177.1 lb

## 2018-11-25 DIAGNOSIS — N23 Unspecified renal colic: Secondary | ICD-10-CM | POA: Diagnosis not present

## 2018-11-25 DIAGNOSIS — N200 Calculus of kidney: Secondary | ICD-10-CM

## 2018-11-25 LAB — URINALYSIS, COMPLETE
Bilirubin, UA: NEGATIVE
Glucose, UA: NEGATIVE
Ketones, UA: NEGATIVE
Nitrite, UA: NEGATIVE
Specific Gravity, UA: 1.025 (ref 1.005–1.030)
Urobilinogen, Ur: 0.2 mg/dL (ref 0.2–1.0)
pH, UA: 6 (ref 5.0–7.5)

## 2018-11-25 LAB — MICROSCOPIC EXAMINATION: RBC, UA: >30 /hpf — ABNORMAL HIGH (ref 0–2)

## 2018-11-25 NOTE — Progress Notes (Signed)
11/25/2018 9:45 AM   [name] 1966-09-13 [id number]  Referring provider: [name], FNP [address] [place], [place] [address]  Chief Complaint  Patient presents with  . Nephrolithiasis    HPI: 53 year old female with a history of gastric bypass and recurrent stone disease presents with a one-month history of right flank pain.  Her pain has been intermittent.  Severity is moderate.  She has intermittent nausea without vomiting.  Denies fever or chills.  Recently her pain has been radiating to the right lower quadrant.  No identifiable precipitating, aggravating or alleviating factors.  She had a CT August 2019 which showed bilateral, nonobstructing renal calculi and mild right hydronephrosis/hydroureter thought secondary to recently passed stone.   PMH: Past Medical History:  Diagnosis Date  . Cancer ([place])    skin cancer on nose  . Diabetes mellitus without complication ([place])    resolved since gastric bypass in 2017-NO MEDS  . Hyperlipidemia   . Hypertension   . Kidney stone   . Migraine    Has used imitrex-last migraine in 2013  . MRSA carrier   . Sleep apnea    had gastric bypass surgery in 2017 and no longer needs CPAP    Surgical History: Past Surgical History:  Procedure Laterality Date  . BARIATRIC SURGERY  2017  . BREAST BIOPSY Right 2017   FIBROCYSTIC CHANGES WITH CALCIFICATIONS  . CESAREAN SECTION    . CHOLECYSTECTOMY  [id number]  . CYSTOSCOPY WITH STENT PLACEMENT Right 09/10/2017   Procedure: CYSTOSCOPY WITH STENT PLACEMENT;  Surgeon: Abbie Sons, MD;  Location: [facility] ORS;  Service: Urology;  Laterality: Right;  . TONSILLECTOMY AND ADENOIDECTOMY    . URETEROSCOPY WITH HOLMIUM LASER LITHOTRIPSY Right 09/10/2017   Procedure: URETEROSCOPY WITH HOLMIUM LASER LITHOTRIPSY;  Surgeon: Abbie Sons, MD;  Location: [facility] ORS;  Service: Urology;  Laterality: Right;    Home Medications:  Allergies as of 11/25/2018    Reactions   Latex Shortness Of Breath   Reaction:  SOB, coughing   Omega-3 Shortness Of Breath   Reaction: SOB, coughing   Other Shortness Of Breath   Reaction:  SOB, coughing   Tolmetin Hives   Levemir [insulin Detemir]    Hives   Nsaids Hives      Medication List       Accurate as of November 25, 2018  9:45 AM. Always use your most recent med list.        BARIATRIC FUSION Chew Chew 1 each by mouth daily.   CENTRUM PERFORMANCE Tabs Take by mouth.   folic acid 1 MG tablet Commonly known as:  FOLVITE Take by mouth.   UNABLE TO FIND Med Name: Tumeric   Vitamin D3 125 MCG (5000 UT) Tabs Take 1 tablet by mouth daily.       Allergies:  Allergies  Allergen Reactions  . Latex Shortness Of Breath    Reaction:  SOB, coughing  . Omega-3 Shortness Of Breath    Reaction: SOB, coughing  . Other Shortness Of Breath    Reaction:  SOB, coughing  . Tolmetin Hives  . Levemir [Insulin Detemir]     Hives  . Nsaids Hives    Family History: Family History  Problem Relation Age of Onset  . Alcohol abuse Mother   . Hyperlipidemia Mother   . Hypertension Mother   . Hyperlipidemia Father   . Hypertension Father   . Parkinson's disease Father   . Drug abuse Sister   .  Cancer Maternal Grandmother        breast cancer  . Breast cancer Maternal Grandmother   . Cancer Paternal Grandmother   . Kidney cancer Neg Hx   . Prostate cancer Neg Hx     Social History:  reports that she has never smoked. She has never used smokeless tobacco. She reports that she does not drink alcohol or use drugs.  ROS: UROLOGY Frequent Urination?: No Hard to postpone urination?: No Burning/pain with urination?: No Get up at night to urinate?: No Leakage of urine?: No Urine stream starts and stops?: Yes Trouble starting stream?: Yes Do you have to strain to urinate?: Yes Blood in urine?: Yes Urinary tract infection?: No Sexually transmitted disease?: No Injury to kidneys or bladder?:  No Painful intercourse?: No Weak stream?: No Currently pregnant?: No Vaginal bleeding?: No Last menstrual period?: Postmenpausal  Gastrointestinal Nausea?: Yes Vomiting?: No Indigestion/heartburn?: No Diarrhea?: No Constipation?: No  Constitutional Fever: No Night sweats?: No Weight loss?: No Fatigue?: No  Skin Skin rash/lesions?: No Itching?: No  Eyes Blurred vision?: No Double vision?: No  Ears/Nose/Throat Sore throat?: No Sinus problems?: No  Hematologic/Lymphatic Swollen glands?: No Easy bruising?: No  Cardiovascular Leg swelling?: No Chest pain?: No  Respiratory Cough?: No Shortness of breath?: No  Endocrine Excessive thirst?: No  Musculoskeletal Back pain?: No Joint pain?: No  Neurological Headaches?: No Dizziness?: No  Psychologic Depression?: No Anxiety?: No  Physical Exam: BP 134/72 (BP Location: Left Arm, Patient Position: Sitting, Cuff Size: Normal)   Pulse 70   Ht 5\' 8"  (1.727 m)   Wt 177 lb 1.6 oz (80.3 kg)   LMP 07/11/2016   BMI 26.93 kg/m   Constitutional:  Alert and oriented, No acute distress. HEENT: [place] AT, moist mucus membranes.  Trachea midline, no masses. Cardiovascular: No clubbing, cyanosis, or edema. Respiratory: Normal respiratory effort, no increased work of breathing. Neurologic: Grossly intact, no focal deficits, moving all 4 extremities. Psychiatric: Normal mood and affect.  Urinalysis: Dipstick 3+ blood/2+ protein/1+ leukocytes; microscopy >30 RBC, calcium oxalate crystals  Assessment & Plan:   53 year old female with recurrent stone disease and a right renal colic.  She most likely has a right ureteral calculus.  Her symptoms have been present for 1 month.  We will initially obtain a KUB and if no obvious ureteral calculus is identified will proceed with a stone protocol CT.  She declined narcotic pain medication.    Abbie Sons, [facility] [address] [place], [place] [address] [phone]

## 2018-11-25 NOTE — Telephone Encounter (Signed)
Pt calls and questions the results of her KUB done this morning. Advised pt that per the note on her x-ray stones are unchanged since her CT from a few months ago. Pt became very upset and states that she doesn't understand. Please review KUB and advise. Advised pt to go to the ED if pain worsens. Patient does have mychart for weekend access.

## 2018-11-27 NOTE — Telephone Encounter (Signed)
See kub result note- need ct as discussed at office visit

## 2018-11-28 ENCOUNTER — Encounter: Payer: Self-pay | Admitting: Urology

## 2018-11-29 ENCOUNTER — Other Ambulatory Visit: Payer: Self-pay | Admitting: Chiropractor

## 2018-11-29 ENCOUNTER — Ambulatory Visit
Admission: RE | Admit: 2018-11-29 | Discharge: 2018-11-29 | Disposition: A | Discharge status: Home / Self Care | Payer: Managed Care, Other (non HMO) | Attending: Chiropractor | Admitting: Chiropractor

## 2018-11-29 ENCOUNTER — Ambulatory Visit
Admission: RE | Admit: 2018-11-29 | Discharge: 2018-11-29 | Disposition: A | Discharge status: Home / Self Care | Payer: Managed Care, Other (non HMO) | Source: Ambulatory Visit | Attending: Chiropractor | Admitting: Chiropractor

## 2018-11-29 DIAGNOSIS — M8588 Other specified disorders of bone density and structure, other site: Secondary | ICD-10-CM | POA: Insufficient documentation

## 2018-11-29 DIAGNOSIS — R52 Pain, unspecified: Secondary | ICD-10-CM

## 2018-11-29 NOTE — Telephone Encounter (Signed)
Pt informed via mychart

## 2018-12-06 ENCOUNTER — Ambulatory Visit
Admission: RE | Admit: 2018-12-06 | Discharge: 2018-12-06 | Disposition: A | Discharge status: Home / Self Care | Payer: Managed Care, Other (non HMO) | Source: Ambulatory Visit | Attending: Urology | Admitting: Urology

## 2018-12-06 DIAGNOSIS — N200 Calculus of kidney: Secondary | ICD-10-CM | POA: Insufficient documentation

## 2018-12-06 DIAGNOSIS — N23 Unspecified renal colic: Secondary | ICD-10-CM | POA: Diagnosis present

## 2018-12-07 ENCOUNTER — Telehealth: Payer: Self-pay | Admitting: Urology

## 2018-12-07 NOTE — Telephone Encounter (Signed)
Patient has questions regarding surgery. Please see MyChart message from patient. Denny Peon will need orders please.

## 2018-12-07 NOTE — Telephone Encounter (Signed)
-----   Message from Abbie Sons, MD sent at 12/07/2018  8:31 AM EST ----- CT was reviewed and shows a 16 mm right renal pelvic calculus with mild hydronephrosis which is most likely causing her pain.  The stone will be too large to pass.  The stone could be treated with either shockwave lithotripsy or ureteroscopy.  Ureteroscopy would have the advantage of treating other stones in her right kidney.

## 2018-12-07 NOTE — Telephone Encounter (Signed)
Pt. Called and said she wants to set up surgery and she is fine with having the stent as long as she can have an anti inflammatory before and after surgery. Pt. Wants to set Surgery up for 01/04/19 or 01/17/19.

## 2018-12-08 NOTE — Telephone Encounter (Signed)
Will get you the order sheet tomorrow at the latest.

## 2018-12-09 ENCOUNTER — Other Ambulatory Visit: Payer: Self-pay | Admitting: Radiology

## 2018-12-09 DIAGNOSIS — N200 Calculus of kidney: Secondary | ICD-10-CM

## 2018-12-20 ENCOUNTER — Other Ambulatory Visit: Payer: Self-pay | Admitting: Radiology

## 2018-12-20 NOTE — Telephone Encounter (Signed)
Spoke to patient on the phone and discussed the [facility] Information form below as well as the Instructions for Pre-Admission Testing and where their office is located.   [place], [place], [place] [address] Telephone: [phone] Fax: [phone]   Thank you for choosing [facility] for your upcoming surgery!  We are always here to assist in your urological needs.  Please read the following information with specific details for your upcoming appointments related to your surgery. Please contact Amy at [phone] Option 3 with any questions.  The Name of Your Surgery: right Ureteroscopy,laser lithotripsy,stone removal and stent placement. Your Surgery Date: 01/17/19 Your Surgeon: John Giovanni  Please call Same Day Surgery at [phone] between the hours of 1pm-3pm one day prior to your surgery. They will inform you of the time to arrive at Same Day Surgery which is located on the second floor of the [facility].   Please refer to the attached letter regarding instructions for Pre-Admission Testing. You will receive a call from the [place] office regarding your appointment with them.  The Pre-Admission Testing office is located at [place], on the first floor of the [facility] in [place] (office is to the right as you enter through the [employer] of the [facility]). Please have all medications you are currently taking and your insurance card available.   Patient was advised to have nothing to eat or drink after midnight the night prior to surgery except that she may have only water until 2 hours before surgery with nothing to drink within 2 hours of surgery.  The patient states she currently takes no Anticoagulants. Patient's questions were answered and she expressed understanding of  these instructions.

## 2018-12-23 ENCOUNTER — Telehealth: Payer: Self-pay | Admitting: Urology

## 2018-12-23 NOTE — Telephone Encounter (Signed)
Pt.called to cx surgery due to Mandatory work schedule. Pt. Did not want to reschedule at this time and said she would call back if or when she decides to re-schedule.

## 2018-12-27 ENCOUNTER — Encounter: Payer: Self-pay | Admitting: Urology

## 2019-01-03 ENCOUNTER — Inpatient Hospital Stay: Admission: RE | Admit: 2019-01-03 | Payer: Self-pay | Source: Ambulatory Visit

## 2019-01-07 NOTE — Telephone Encounter (Signed)
Lithotripsy orders completed

## 2019-01-11 ENCOUNTER — Encounter: Payer: Self-pay | Admitting: Urology

## 2019-01-12 ENCOUNTER — Encounter: Admission: RE | Payer: Self-pay | Source: Home / Self Care

## 2019-01-12 ENCOUNTER — Ambulatory Visit: Admission: RE | Admit: 2019-01-12 | Payer: Managed Care, Other (non HMO) | Source: Home / Self Care | Admitting: Urology

## 2019-01-12 SURGERY — LITHOTRIPSY, ESWL
Anesthesia: Moderate Sedation | Laterality: Right

## 2019-01-17 ENCOUNTER — Ambulatory Visit: Admit: 2019-01-17 | Payer: Managed Care, Other (non HMO) | Admitting: Urology

## 2019-01-17 SURGERY — CYSTOSCOPY/URETEROSCOPY/HOLMIUM LASER/STENT PLACEMENT
Anesthesia: Choice | Laterality: Right

## 2019-01-22 ENCOUNTER — Encounter: Payer: Self-pay | Admitting: Urology

## 2019-01-26 ENCOUNTER — Emergency Department (HOSPITAL_COMMUNITY)
Admission: EM | Admit: 2019-01-26 | Discharge: 2019-01-26 | Disposition: A | Discharge status: Home / Self Care | Payer: Managed Care, Other (non HMO) | Attending: Emergency Medicine | Admitting: Emergency Medicine

## 2019-01-26 ENCOUNTER — Ambulatory Visit: Payer: Managed Care, Other (non HMO) | Admitting: Urology

## 2019-01-26 ENCOUNTER — Emergency Department (HOSPITAL_COMMUNITY): Payer: Managed Care, Other (non HMO)

## 2019-01-26 ENCOUNTER — Encounter (HOSPITAL_COMMUNITY): Payer: Self-pay | Admitting: Emergency Medicine

## 2019-01-26 ENCOUNTER — Other Ambulatory Visit: Payer: Self-pay

## 2019-01-26 ENCOUNTER — Encounter (HOSPITAL_COMMUNITY)
Admission: EM | Disposition: A | Discharge status: Home / Self Care | Payer: Self-pay | Source: Home / Self Care | Attending: Emergency Medicine

## 2019-01-26 DIAGNOSIS — E119 Type 2 diabetes mellitus without complications: Secondary | ICD-10-CM | POA: Insufficient documentation

## 2019-01-26 DIAGNOSIS — I10 Essential (primary) hypertension: Secondary | ICD-10-CM | POA: Insufficient documentation

## 2019-01-26 DIAGNOSIS — Z85828 Personal history of other malignant neoplasm of skin: Secondary | ICD-10-CM | POA: Insufficient documentation

## 2019-01-26 DIAGNOSIS — N201 Calculus of ureter: Secondary | ICD-10-CM

## 2019-01-26 DIAGNOSIS — Z87442 Personal history of urinary calculi: Secondary | ICD-10-CM | POA: Insufficient documentation

## 2019-01-26 DIAGNOSIS — N23 Unspecified renal colic: Secondary | ICD-10-CM

## 2019-01-26 DIAGNOSIS — Z91040 Latex allergy status: Secondary | ICD-10-CM | POA: Diagnosis not present

## 2019-01-26 DIAGNOSIS — Z9884 Bariatric surgery status: Secondary | ICD-10-CM | POA: Insufficient documentation

## 2019-01-26 DIAGNOSIS — R1012 Left upper quadrant pain: Secondary | ICD-10-CM | POA: Diagnosis present

## 2019-01-26 DIAGNOSIS — N132 Hydronephrosis with renal and ureteral calculous obstruction: Secondary | ICD-10-CM | POA: Diagnosis not present

## 2019-01-26 LAB — CBC
HCT: 40.3 % (ref 36.0–46.0)
Hemoglobin: 13.1 g/dL (ref 12.0–15.0)
MCH: 28.9 pg (ref 26.0–34.0)
MCHC: 32.5 g/dL (ref 30.0–36.0)
MCV: 88.8 fL (ref 80.0–100.0)
Platelets: 211 10*3/uL (ref 150–400)
RBC: 4.54 MIL/uL (ref 3.87–5.11)
RDW: 12.4 % (ref 11.5–15.5)
WBC: 6 10*3/uL (ref 4.0–10.5)
nRBC: 0 % (ref 0.0–0.2)

## 2019-01-26 LAB — COMPREHENSIVE METABOLIC PANEL
ALT: 42 U/L (ref 0–44)
AST: 36 U/L (ref 15–41)
Albumin: 3.9 g/dL (ref 3.5–5.0)
Alkaline Phosphatase: 137 U/L — ABNORMAL HIGH (ref 38–126)
Anion gap: 7 (ref 5–15)
BUN: 9 mg/dL (ref 6–20)
CO2: 26 mmol/L (ref 22–32)
Calcium: 9 mg/dL (ref 8.9–10.3)
Chloride: 106 mmol/L (ref 98–111)
Creatinine, Ser: 0.79 mg/dL (ref 0.44–1.00)
GFR calc Af Amer: >60 mL/min (ref >60)
GFR calc non Af Amer: >60 mL/min (ref >60)
Glucose, Bld: 95 mg/dL (ref 70–99)
Potassium: 3.4 mmol/L — ABNORMAL LOW (ref 3.5–5.1)
Sodium: 139 mmol/L (ref 135–145)
Total Bilirubin: 0.6 mg/dL (ref 0.3–1.2)
Total Protein: 7.4 g/dL (ref 6.5–8.1)

## 2019-01-26 LAB — URINALYSIS, ROUTINE W REFLEX MICROSCOPIC
Bacteria, UA: NONE SEEN
Bilirubin Urine: NEGATIVE
Glucose, UA: NEGATIVE mg/dL
Ketones, ur: NEGATIVE mg/dL
Nitrite: NEGATIVE
Protein, ur: NEGATIVE mg/dL
RBC / HPF: >50 RBC/hpf — ABNORMAL HIGH (ref 0–5)
Specific Gravity, Urine: 1.009 (ref 1.005–1.030)
pH: 6 (ref 5.0–8.0)

## 2019-01-26 LAB — LIPASE, BLOOD: Lipase: 25 U/L (ref 11–51)

## 2019-01-26 LAB — I-STAT BETA HCG BLOOD, ED (MC, WL, AP ONLY): I-stat hCG, quantitative: <5 m[IU]/mL (ref <5)

## 2019-01-26 SURGERY — CYSTOSCOPY, WITH RETROGRADE PYELOGRAM AND URETERAL STENT INSERTION
Anesthesia: General | Laterality: Left

## 2019-01-26 MED ORDER — HYDROMORPHONE HCL 1 MG/ML IJ SOLN
1.0000 mg | INTRAMUSCULAR | Status: DC | PRN
  Administered 2019-01-26: 1 mg via INTRAVENOUS
  Filled 2019-01-26 (×2): qty 1

## 2019-01-26 MED ORDER — HYDROMORPHONE HCL 1 MG/ML IJ SOLN
1.0000 mg | Freq: Once | INTRAMUSCULAR | Status: AC
  Administered 2019-01-26: 1 mg via INTRAVENOUS

## 2019-01-26 MED ORDER — OXYCODONE-ACETAMINOPHEN 5-325 MG PO TABS
1.0000 | ORAL_TABLET | Freq: Once | ORAL | Status: AC
  Administered 2019-01-26: 1 via ORAL
  Filled 2019-01-26: qty 1

## 2019-01-26 MED ORDER — SODIUM CHLORIDE 0.9% FLUSH: 3.0000 mL | Freq: Once | INTRAVENOUS | Status: DC

## 2019-01-26 MED ORDER — ONDANSETRON HCL 4 MG/2ML IJ SOLN
4.0000 mg | Freq: Once | INTRAMUSCULAR | Status: AC
  Administered 2019-01-26: 4 mg via INTRAVENOUS
  Filled 2019-01-26: qty 2

## 2019-01-26 MED ORDER — ONDANSETRON 8 MG PO TBDP: 8.0000 mg | ORAL_TABLET | Freq: Three times a day (TID) | ORAL | 0 refills | Status: DC | PRN

## 2019-01-26 MED ORDER — SODIUM CHLORIDE 0.9 % IV SOLN: Freq: Once | INTRAVENOUS | Status: DC

## 2019-01-26 MED ORDER — ACETAMINOPHEN 10 MG/ML IV SOLN
1000.0000 mg | Freq: Once | INTRAVENOUS | Status: AC
  Administered 2019-01-26: 1000 mg via INTRAVENOUS
  Filled 2019-01-26: qty 100

## 2019-01-26 MED ORDER — MORPHINE SULFATE (PF) 4 MG/ML IV SOLN: 8.0000 mg | Freq: Once | INTRAVENOUS | Status: DC

## 2019-01-26 MED ORDER — ONDANSETRON HCL 4 MG/2ML IJ SOLN
4.0000 mg | Freq: Once | INTRAMUSCULAR | Status: AC | PRN
  Administered 2019-01-26: 4 mg via INTRAVENOUS
  Filled 2019-01-26: qty 2

## 2019-01-26 MED ORDER — MORPHINE SULFATE (PF) 4 MG/ML IV SOLN: 6.0000 mg | INTRAVENOUS | Status: DC | PRN

## 2019-01-26 MED ORDER — HYDROMORPHONE HCL 1 MG/ML IJ SOLN
1.0000 mg | Freq: Once | INTRAMUSCULAR | Status: AC
  Administered 2019-01-26: 1 mg via INTRAVENOUS
  Filled 2019-01-26: qty 1

## 2019-01-26 MED ORDER — MORPHINE SULFATE (PF) 4 MG/ML IV SOLN: 6.0000 mg | Freq: Once | INTRAVENOUS | Status: DC

## 2019-01-26 MED ORDER — SODIUM CHLORIDE 0.9 % IV BOLUS
1000.0000 mL | Freq: Once | INTRAVENOUS | Status: AC
  Administered 2019-01-26: 1000 mL via INTRAVENOUS

## 2019-01-26 NOTE — ED Triage Notes (Signed)
Pt presents with kidney stones that started to hurt 30 minutes ago while here at work. Pt reports a positive CT scan in February with multiple stones with the largest being 5mm.

## 2019-01-26 NOTE — ED Notes (Signed)
Per Dr. Matilde Sprang, Pt is to follow up with Dr. Bernardo Heater, Urologist in [place], and utilize [facility] ED for fever, worsening symptoms.

## 2019-01-26 NOTE — ED Provider Notes (Signed)
[facility] EMERGENCY DEPARTMENT Provider Note   CSN: [id number] Arrival date & time: 01/26/19  0326    History   Chief Complaint Chief Complaint  Patient presents with  . Nephrolithiasis    HPI [name] is a 53 y.o. female.     HPI   53 yo F with h/o obesity, s/p bypass, h/o kidney stones requiring multiple interventions here with severe L flank pain. Pt was in usual state of health until this morning. She was working in lab (works as our Doctor, general practice at [employer]) and developed acute onset severe, 10/10, aching, gnawing L flank pain. She ahd associated nausea, vomiting. No fever. No preceding urinary sx. She has an extensive h/o kidney stones requiring multiple prior interventions, including known large stone on R flank. Pain worse w/ movement and palpation. No alleviating factors.   Past Medical History:  Diagnosis Date  . Cancer ([place])    skin cancer on nose  . Diabetes mellitus without complication ([place])    resolved since gastric bypass in 2017-NO MEDS  . Hyperlipidemia   . Hypertension   . Kidney stone   . Migraine    Has used imitrex-last migraine in 2013  . MRSA carrier   . Sleep apnea    had gastric bypass surgery in 2017 and no longer needs CPAP    Patient Active Problem List   Diagnosis Date Noted  . Right ureteral calculus 08/31/2017  . Hydronephrosis with urinary obstruction due to ureteral calculus 08/31/2017  . Nephrolithiasis 08/31/2017  . Screen for colon cancer 07/26/2017  . Depression, recurrent ([place]) 06/22/2017  . Gross hematuria 11/03/2016  . Sleep apnea 10/06/2016  . Hyperlipidemia 09/21/2016  . Candidal intertrigo 09/21/2016  . H/O gastric bypass 09/21/2016  . Nocturnal leg cramps 01/21/2015  . Elevated liver enzymes 09/04/2014  . Vitamin D deficiency 09/03/2014  . Encounter for routine gynecological examination 06/18/2014  . Diabetes type 2, uncontrolled ([place]) 01/22/2014  . Routine general medical examination at  a health care facility 01/11/2014  . Numbness and tingling of both legs 01/11/2014  . Knee pain, bilateral 01/11/2014  . Obesity 01/11/2014  . HTN (hypertension) 01/11/2014    Past Surgical History:  Procedure Laterality Date  . BARIATRIC SURGERY  2017  . BREAST BIOPSY Right 2017   FIBROCYSTIC CHANGES WITH CALCIFICATIONS  . CESAREAN SECTION    . CHOLECYSTECTOMY  [id number]  . CYSTOSCOPY WITH STENT PLACEMENT Right 09/10/2017   Procedure: CYSTOSCOPY WITH STENT PLACEMENT;  Surgeon: Abbie Sons, MD;  Location: [facility] ORS;  Service: Urology;  Laterality: Right;  . TONSILLECTOMY AND ADENOIDECTOMY    . URETEROSCOPY WITH HOLMIUM LASER LITHOTRIPSY Right 09/10/2017   Procedure: URETEROSCOPY WITH HOLMIUM LASER LITHOTRIPSY;  Surgeon: Abbie Sons, MD;  Location: [facility] ORS;  Service: Urology;  Laterality: Right;     OB History    Gravida  3   Para  2   Term      Preterm      AB  1   Living        SAB  1   TAB      Ectopic      Multiple      Live Births           Obstetric Comments  1st Menstrual Cycle:  ? 1st Pregnancy:  21         Home Medications    Prior to Admission medications   Medication Sig Start Date End Date Taking? Authorizing  Provider  TURMERIC PO Take 95 mg by mouth daily.   Yes [provider]    Family History Family History  Problem Relation Age of Onset  . Alcohol abuse Mother   . Hyperlipidemia Mother   . Hypertension Mother   . Hyperlipidemia Father   . Hypertension Father   . Parkinson's disease Father   . Drug abuse Sister   . Cancer Maternal Grandmother        breast cancer  . Breast cancer Maternal Grandmother   . Cancer Paternal Grandmother   . Kidney cancer Neg Hx   . Prostate cancer Neg Hx     Social History Social History   Tobacco Use  . Smoking status: Never Smoker  . Smokeless tobacco: Never Used  Substance Use Topics  . Alcohol use: No    Alcohol/week: 0.0 standard drinks  . Drug use: No      Allergies   Latex; Tolmetin; Levemir [insulin detemir]; and Nsaids   Review of Systems Review of Systems  Constitutional: Positive for fatigue. Negative for chills and fever.  HENT: Negative for congestion and rhinorrhea.   Eyes: Negative for visual disturbance.  Respiratory: Negative for cough, shortness of breath and wheezing.   Cardiovascular: Negative for chest pain and leg swelling.  Gastrointestinal: Positive for nausea and vomiting. Negative for abdominal pain and diarrhea.  Genitourinary: Positive for flank pain, frequency and hematuria. Negative for dysuria.  Musculoskeletal: Negative for neck pain and neck stiffness.  Skin: Negative for rash and wound.  Allergic/Immunologic: Negative for immunocompromised state.  Neurological: Negative for syncope, weakness and headaches.  All other systems reviewed and are negative.    Physical Exam Updated Vital Signs BP (!) 161/99   Pulse 85   Temp 97.7 F (36.5 C) (Oral)   Resp 20   Ht 5\' 8"  (1.727 m)   Wt 80.7 kg   LMP 07/11/2016 (Exact Date)   SpO2 97%   BMI 27.06 kg/m   Physical Exam Vitals signs and nursing note reviewed.  Constitutional:      General: She is in acute distress.     Appearance: She is well-developed.  HENT:     Head: Normocephalic and atraumatic.  Eyes:     Conjunctiva/sclera: Conjunctivae normal.  Neck:     Musculoskeletal: Neck supple.  Cardiovascular:     Rate and Rhythm: Normal rate and regular rhythm.     Heart sounds: Normal heart sounds. No murmur. No friction rub.  Pulmonary:     Effort: Pulmonary effort is normal. No respiratory distress.     Breath sounds: Normal breath sounds. No wheezing or rales.  Abdominal:     General: There is no distension.     Palpations: Abdomen is soft.     Tenderness: There is no abdominal tenderness.     Comments: Diffuse TTP, worse in LUQ and L flank. Mild L CVAT.  Skin:    General: Skin is warm.     Capillary Refill: Capillary refill takes less than  2 seconds.  Neurological:     Mental Status: She is alert and oriented to person, place, and time.     Motor: No abnormal muscle tone.      ED Treatments / Results  Labs (all labs ordered are listed, but only abnormal results are displayed) Labs Reviewed  COMPREHENSIVE METABOLIC PANEL - Abnormal; Notable for the following components:      Result Value   Potassium 3.4 (*)    Alkaline Phosphatase 137 (*)  All other components within normal limits  URINALYSIS, ROUTINE W REFLEX MICROSCOPIC - Abnormal; Notable for the following components:   Hgb urine dipstick LARGE (*)    Leukocytes,Ua SMALL (*)    RBC / HPF >50 (*)    All other components within normal limits  LIPASE, BLOOD  CBC  I-STAT BETA HCG BLOOD, ED ([facility], [facility], [facility] ONLY)    EKG None  Radiology Ct Renal Stone Study  Result Date: 01/26/2019 CLINICAL DATA:  Bilateral flank pain EXAM: CT ABDOMEN AND PELVIS WITHOUT CONTRAST TECHNIQUE: Multidetector CT imaging of the abdomen and pelvis was performed following the standard protocol without IV contrast. COMPARISON:  12/06/2018 FINDINGS: Lower chest:  No contributory findings. Hepatobiliary: No focal liver abnormality.Cholecystectomy. Normal common bile duct diameter. Pancreas: Unremarkable. Spleen: Unremarkable. Adrenals/Urinary Tract: Negative adrenals. 7 x 4 mm stone in the proximal left ureter with mild hydronephrosis. 5 left renal calculi as visualized on coronal reformats measuring up to 4 mm at the lower pole. Chronic 17 mm stone in the right renal pelvis with hydronephrosis and renal hilar fat haziness. Extensive layering calcification with right lower pole intrarenal collecting system. Additional calcifications along the lower right ureter are from phleboliths. Unremarkable bladder. Stomach/Bowel: Gastric bypass. No obstruction. No evidence of bowel inflammation Vascular/Lymphatic: No acute vascular abnormality. No mass or adenopathy. Reproductive:No pathologic findings. Other: No  ascites or pneumoperitoneum. Musculoskeletal: No acute abnormalities. IMPRESSION: 1. 7 x 4 mm proximal left ureteral calculus with mild hydronephrosis, new from January 2020 CT. 2. 17 mm right renal pelvis calculus with moderate hydronephrosis and periureteric edema, unchanged from January 2020. 3. Multiple intrarenal calculi that are more numerous on the right. Electronically Signed   By: Monte Fantasia M.D.   On: 01/26/2019 04:25    Procedures Procedures (including critical care time)  Medications Ordered in ED Medications  morphine 4 MG/ML injection 6 mg (0 mg Intravenous Hold 01/26/19 0659)  acetaminophen (OFIRMEV) IV 1,000 mg (1,000 mg Intravenous New Bag/Given 01/26/19 0657)  ondansetron (ZOFRAN) injection 4 mg (4 mg Intravenous Given 01/26/19 0359)  HYDROmorphone (DILAUDID) injection 1 mg (1 mg Intravenous Given 01/26/19 0359)  sodium chloride 0.9 % bolus 1,000 mL (0 mLs Intravenous Stopped 01/26/19 0515)  ondansetron (ZOFRAN) injection 4 mg (4 mg Intravenous Given 01/26/19 0520)  HYDROmorphone (DILAUDID) injection 1 mg (1 mg Intravenous Given 01/26/19 7371)     Initial Impression / Assessment and Plan / ED Course  I have reviewed the triage vital signs and the nursing notes.  Pertinent labs & imaging results that were available during my care of the patient were reviewed by me and considered in my medical decision making (see chart for details).        53 yo F with PMHx as above here w/ left flank pain. Imaging shows recurrent large stone. Pt with persistent pain despite multiple doses of IV analgesics. Will give IV tylenol. Dr. Alyson Ingles consulted, will see pt. Pt NPO. No leukocytosis, fever, or signs of infection.   Of note, management somewhat complicated by h/o gastric surgery, poor tolerance of PO meds.  Final Clinical Impressions(s) / ED Diagnoses   Final diagnoses:  Renal colic  Ureterolithiasis    ED Discharge Orders    None       Duffy Bruce, MD 01/26/19 [phone]

## 2019-01-26 NOTE — Consult Note (Signed)
Urology Consult  Referring physician: Jerome Reason for referral: ureter stone  Chief Complaint: ureter stone  History of Present Illness: 53 yo F with h/o obesity, s/p bypass, h/o kidney stones requiring multiple interventions here with severe L flank pain. Followed by Dr Bernardo Heater in [facility]  Patient had a CT scan on January 28 and had 4 nonobstructing stones lower pole of left kidney and 5 nonobstructing stones right lower pole.  She also had a 16 mm stone in the right renal pelvis with moderate right hydronephrosis  Patient has chronic intermittent right flank pain and was going to have ureteroscopy and then finally lithotripsy but they canceled things because of she was on an anti-inflammatory and for family reasons.  She came in today because of left flank pain and nausea and retching.  No fever.  No blood in the urine recently.  No cystitis symptoms.  She has had a number lithotripsies and ureteroscopy's.  She does not do that well with a stent.  She has had a stent recently.  She has a lifelong stone history were since her gastric bypass surgery  Patient had a CT scan this morning and has a 17 mm stone in the right renal pelvis with hydronephrosis.  Patient has layering of stones in the right lower pole and patient has a 7 x 4 mm stone in the proximal left ureter with mild hydronephrosis.  She has 5 renal stones in the lower pole on the left side  The patient has a left ureteral stone with mild hydronephrosis and a more chronic 17 mm right renal stone with moderate hydronephrosis and some peri-ureteral edema unchanged from January 2020  Modifying factors: There are no other modifying factors  Associated signs and symptoms: There are no other associated signs and symptoms Aggravating and relieving factors: There are no other aggravating or relieving factors Severity: Moderate Duration: Persistent      Past Medical History:  Diagnosis Date  . Cancer ([place])    skin cancer on nose   . Diabetes mellitus without complication ([place])    resolved since gastric bypass in 2017-NO MEDS  . Hyperlipidemia   . Hypertension   . Kidney stone   . Migraine    Has used imitrex-last migraine in 2013  . MRSA carrier   . Sleep apnea    had gastric bypass surgery in 2017 and no longer needs CPAP   Past Surgical History:  Procedure Laterality Date  . BARIATRIC SURGERY  2017  . BREAST BIOPSY Right 2017   FIBROCYSTIC CHANGES WITH CALCIFICATIONS  . CESAREAN SECTION    . CHOLECYSTECTOMY  [id number]  . CYSTOSCOPY WITH STENT PLACEMENT Right 09/10/2017   Procedure: CYSTOSCOPY WITH STENT PLACEMENT;  Surgeon: Abbie Sons, MD;  Location: [facility] ORS;  Service: Urology;  Laterality: Right;  . TONSILLECTOMY AND ADENOIDECTOMY    . URETEROSCOPY WITH HOLMIUM LASER LITHOTRIPSY Right 09/10/2017   Procedure: URETEROSCOPY WITH HOLMIUM LASER LITHOTRIPSY;  Surgeon: Abbie Sons, MD;  Location: [facility] ORS;  Service: Urology;  Laterality: Right;    Medications: I have reviewed the patient's current medications. Allergies:  Allergies  Allergen Reactions  . Latex Shortness Of Breath and Cough  . Tolmetin Hives  . Levemir [Insulin Detemir]     Hives  . Nsaids Hives    Family History  Problem Relation Age of Onset  . Alcohol abuse Mother   . Hyperlipidemia Mother   . Hypertension Mother   . Hyperlipidemia Father   . Hypertension Father   .  Parkinson's disease Father   . Drug abuse Sister   . Cancer Maternal Grandmother        breast cancer  . Breast cancer Maternal Grandmother   . Cancer Paternal Grandmother   . Kidney cancer Neg Hx   . Prostate cancer Neg Hx    Social History:  reports that she has never smoked. She has never used smokeless tobacco. She reports that she does not drink alcohol or use drugs.  ROS: All systems are reviewed and negative except as noted. Rest negative  Physical Exam:  Vital signs in last 24 hours: Temp:  [97.7 F (36.5 C)] 97.7 F (36.5 C) (03/19  0344) Pulse Rate:  [69-85] 85 (03/19 0545) Resp:  [20-22] 20 (03/19 0530) BP: (161-202)/(87-108) 161/99 (03/19 0545) SpO2:  [97 %-100 %] 97 % (03/19 0545) Weight:  [80.7 kg] 80.7 kg (03/19 0345)  Cardiovascular: Skin warm; not flushed Respiratory: Breaths quiet; no shortness of breath Abdomen: No masses Neurological: Normal sensation to touch Musculoskeletal: Normal motor function arms and legs Lymphatics: No inguinal adenopathy Skin: No rashes Genitourinary: A little bit dehydrated but nontoxic  Laboratory Data:  Results for orders placed or performed during the hospital encounter of 01/26/19 (from the past 72 hour(s))  Lipase, blood     Status: None   Collection Time: 01/26/19  3:59 AM  Result Value Ref Range   Lipase 25 11 - 51 U/L    Comment: Performed at [facility], [address] [place], [place] [address]  Comprehensive metabolic panel     Status: Abnormal   Collection Time: 01/26/19  3:59 AM  Result Value Ref Range   Sodium 139 135 - 145 mmol/L   Potassium 3.4 (L) 3.5 - 5.1 mmol/L   Chloride 106 98 - 111 mmol/L   CO2 26 22 - 32 mmol/L   Glucose, Bld 95 70 - 99 mg/dL   BUN 9 6 - 20 mg/dL   Creatinine, Ser 0.79 0.44 - 1.00 mg/dL   Calcium 9.0 8.9 - 10.3 mg/dL   Total Protein 7.4 6.5 - 8.1 g/dL   Albumin 3.9 3.5 - 5.0 g/dL   AST 36 15 - 41 U/L   ALT 42 0 - 44 U/L   Alkaline Phosphatase 137 (H) 38 - 126 U/L   Total Bilirubin 0.6 0.3 - 1.2 mg/dL   GFR calc non Af Amer >60 >60 mL/min   GFR calc Af Amer >60 >60 mL/min   Anion gap 7 5 - 15    Comment: Performed at [facility], [place] [address], [place], [place] [address]  CBC     Status: None   Collection Time: 01/26/19  3:59 AM  Result Value Ref Range   WBC 6.0 4.0 - 10.5 K/uL   RBC 4.54 3.87 - 5.11 MIL/uL   Hemoglobin 13.1 12.0 - 15.0 g/dL   HCT 40.3 36.0 - 46.0 %   MCV 88.8 80.0 - 100.0 fL   MCH 28.9 26.0 - 34.0 pg   MCHC 32.5 30.0 - 36.0 g/dL   RDW 12.4 11.5 - 15.5 %   Platelets 211 150 - 400  K/uL   nRBC 0.0 0.0 - 0.2 %    Comment: Performed at [facility], [place] [address] [place], [place] [address]  I-Stat beta hCG blood, ED     Status: None   Collection Time: 01/26/19  4:16 AM  Result Value Ref Range   I-stat hCG, quantitative <5.0 <5 mIU/mL  Comment 3            Comment:   GEST. AGE      CONC.  (mIU/mL)   <=1 WEEK        5 - 50     2 WEEKS       50 - 500     3 WEEKS       100 - 10,000     4 WEEKS     1,000 - 30,000        FEMALE AND NON-PREGNANT FEMALE:     LESS THAN 5 mIU/mL   Urinalysis, Routine w reflex microscopic     Status: Abnormal   Collection Time: 01/26/19  5:20 AM  Result Value Ref Range   Color, Urine YELLOW YELLOW   APPearance CLEAR CLEAR   Specific Gravity, Urine 1.009 1.005 - 1.030   pH 6.0 5.0 - 8.0   Glucose, UA NEGATIVE NEGATIVE mg/dL   Hgb urine dipstick LARGE (A) NEGATIVE   Bilirubin Urine NEGATIVE NEGATIVE   Ketones, ur NEGATIVE NEGATIVE mg/dL   Protein, ur NEGATIVE NEGATIVE mg/dL   Nitrite NEGATIVE NEGATIVE   Leukocytes,Ua SMALL (A) NEGATIVE   RBC / HPF >50 (H) 0 - 5 RBC/hpf   WBC, UA 11-20 0 - 5 WBC/hpf   Bacteria, UA NONE SEEN NONE SEEN   Squamous Epithelial / LPF 0-5 0 - 5   Mucus PRESENT     Comment: Performed at [facility], [place] [address] [place], [place] [address]   No results found for this or any previous visit (from the past 240 hour(s)). Creatinine: Recent Labs    01/26/19 0359  CREATININE 0.79    Xrays: See report/chart As noted  Impression/Assessment:  The patient has a left ureteral stone.  The patient understands she has a chronic mild right hydronephrosis as well.  We discussed a left ureteral stent and bilateral stents.  She understands the concept of renal failure.  She is concerned about stents in their discomfort.  Pros cons risks and sequelae of stents discussed.  I try to talk to her urologist about doing ureteroscopy or even a bilateral procedure more acutely she understands that I would need  to place a stent.  Plan:  Patient would like to proceed with left ureteral stent  Demauri Advincula A Grigor Lipschutz 01/26/2019, 7:05 AM

## 2019-01-26 NOTE — ED Notes (Signed)
Urology rounding at bedside

## 2019-01-26 NOTE — ED Provider Notes (Signed)
The patient has elected to leave the emergency department at this time.  She would prefer to follow-up with her urologist in [place].  A Percocet was given for pain now.  She understands to return to her local emergency department for any new or worsening symptoms including but not limited to development of fever or severe vomiting uncontrolled by the Zofran   Jola Schmidt, MD 01/26/19 [phone]

## 2019-01-26 NOTE — ED Notes (Signed)
Pulled the PRN order for Dilaudid and administered concurrently as the provider D/C'd the order.
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
## 2019-01-26 NOTE — ED Notes (Signed)
Pt states she does not wish to proceed with surgery today. Dr. Matilde Sprang, Dr. Venora Maples and OR notified.

## 2019-01-26 NOTE — ED Notes (Signed)
Patient verbalizes understanding of discharge instructions. Opportunity for questioning and answers were provided. Armband removed by staff, pt discharged from ED. Ambulated out to lobby  

## 2019-01-26 NOTE — Discharge Instructions (Addendum)
Please present to [facility] if you develop worsening pain and/or fever or nausea vomiting uncontrolled by the Zofran prescribed.

## 2019-01-26 NOTE — ED Notes (Signed)
Pt ambulated to lobby.

## 2019-01-27 ENCOUNTER — Other Ambulatory Visit: Payer: Self-pay | Admitting: Urology

## 2019-01-30 ENCOUNTER — Ambulatory Visit (HOSPITAL_COMMUNITY)
Admission: RE | Admit: 2019-01-30 | Discharge: 2019-01-30 | Disposition: A | Discharge status: Home / Self Care | Payer: Managed Care, Other (non HMO) | Attending: Urology | Admitting: Urology

## 2019-01-30 ENCOUNTER — Encounter (HOSPITAL_COMMUNITY)
Admission: RE | Disposition: A | Discharge status: Home / Self Care | Payer: Self-pay | Source: Home / Self Care | Attending: Urology

## 2019-01-30 ENCOUNTER — Encounter (HOSPITAL_COMMUNITY): Payer: Self-pay | Admitting: Emergency Medicine

## 2019-01-30 ENCOUNTER — Ambulatory Visit (HOSPITAL_COMMUNITY): Payer: Managed Care, Other (non HMO)

## 2019-01-30 ENCOUNTER — Other Ambulatory Visit: Payer: Self-pay

## 2019-01-30 DIAGNOSIS — Z886 Allergy status to analgesic agent status: Secondary | ICD-10-CM | POA: Diagnosis not present

## 2019-01-30 DIAGNOSIS — G4730 Sleep apnea, unspecified: Secondary | ICD-10-CM | POA: Insufficient documentation

## 2019-01-30 DIAGNOSIS — I10 Essential (primary) hypertension: Secondary | ICD-10-CM | POA: Insufficient documentation

## 2019-01-30 DIAGNOSIS — Z888 Allergy status to other drugs, medicaments and biological substances status: Secondary | ICD-10-CM | POA: Diagnosis not present

## 2019-01-30 DIAGNOSIS — N201 Calculus of ureter: Secondary | ICD-10-CM

## 2019-01-30 DIAGNOSIS — E119 Type 2 diabetes mellitus without complications: Secondary | ICD-10-CM | POA: Insufficient documentation

## 2019-01-30 DIAGNOSIS — Z9884 Bariatric surgery status: Secondary | ICD-10-CM | POA: Diagnosis not present

## 2019-01-30 DIAGNOSIS — Z841 Family history of disorders of kidney and ureter: Secondary | ICD-10-CM | POA: Insufficient documentation

## 2019-01-30 DIAGNOSIS — E669 Obesity, unspecified: Secondary | ICD-10-CM | POA: Insufficient documentation

## 2019-01-30 DIAGNOSIS — N132 Hydronephrosis with renal and ureteral calculous obstruction: Secondary | ICD-10-CM | POA: Insufficient documentation

## 2019-01-30 DIAGNOSIS — Z79899 Other long term (current) drug therapy: Secondary | ICD-10-CM | POA: Insufficient documentation

## 2019-01-30 HISTORY — PX: EXTRACORPOREAL SHOCK WAVE LITHOTRIPSY: SHX1557

## 2019-01-30 SURGERY — LITHOTRIPSY, ESWL
Anesthesia: LOCAL

## 2019-01-30 MED ORDER — SODIUM CHLORIDE 0.9 % IV SOLN
INTRAVENOUS | Status: DC
  Administered 2019-01-30: 14:00:00 via INTRAVENOUS

## 2019-01-30 MED ORDER — DIAZEPAM 5 MG PO TABS
10.0000 mg | ORAL_TABLET | ORAL | Status: AC
  Administered 2019-01-30: 10 mg via ORAL
  Filled 2019-01-30: qty 2

## 2019-01-30 MED ORDER — CIPROFLOXACIN HCL 500 MG PO TABS
500.0000 mg | ORAL_TABLET | ORAL | Status: AC
  Administered 2019-01-30: 500 mg via ORAL
  Filled 2019-01-30: qty 1

## 2019-01-30 MED ORDER — DIPHENHYDRAMINE HCL 25 MG PO CAPS
25.0000 mg | ORAL_CAPSULE | ORAL | Status: AC
  Administered 2019-01-30: 25 mg via ORAL
  Filled 2019-01-30: qty 1

## 2019-01-30 NOTE — Interval H&P Note (Signed)
History and Physical Interval Note:  01/30/2019 3:57 PM  [name]  has presented today for surgery, with the diagnosis of LEFT URETERAL STONE, RIGHT [place].  The various methods of treatment have been discussed with the patient and family. After consideration of risks, benefits and other options for treatment, the patient has consented to  Procedure(s): EXTRACORPOREAL SHOCK WAVE LITHOTRIPSY (ESWL) LEFT/ POSSIBLE RIGHT (N/A) as a surgical intervention.  The patient's history has been reviewed, patient examined, no change in status, stable for surgery.  I have reviewed the patient's chart and labs. Stone at left UVJ on KUB and flouro (confirmed on truck). She is well with no fever. She hasnt seen a stone pass but feels some pressure LLQ.  Questions were answered to the patient's satisfaction.  Discussed why we cant treat right stone today, too.    Festus Aloe

## 2019-01-30 NOTE — Discharge Instructions (Signed)

## 2019-01-30 NOTE — Op Note (Signed)
LEFT ESWL   Left distal stone   Findings: Stone at left UVJ - good fragmentation and fading. She tolerated the procedure well.

## 2019-01-30 NOTE — H&P (Signed)
Office Visit Report     01/27/2019   --------------------------------------------------------------------------------   [name]  MRN: [id number]  PRIMARY CARE:    DOB: [birth year], 53 year old Female  REFERRING:  Scott A. MacDiarmid, MD  SSN:   PROVIDER:  Bjorn Loser, M.D.    TREATING:  Festus Aloe, M.D.    LOCATION:  [facility] [phone]   --------------------------------------------------------------------------------   CC: I have ureteral stone.  HPI: [name] is a 53 year-old female patient who was referred by Dr. Nicki Reaper A. MacDiarmid, MD who is here for ureteral stone.  The problem is on both sides. She first stated noticing pain on 01/26/2019. This is not her first kidney stone. She is currently having flank pain. She denies having back pain, groin pain, nausea, vomiting, fever, and chills.   53 yo F with h/o obesity, s/p bypass, h/o kidney stones requiring multiple interventions seen yesterday in ED with severe L flank pain. Followed by Dr Bernardo Heater in [place] and seen by Dr. Matilde Sprang.     Patient had a CT scan on January 28 and had 4 nonobstructing stones lower pole of left kidney and 5 nonobstructing stones right lower pole. She also had a 16 mm stone in the right renal pelvis with moderate right hydronephrosis chronic.     Patient has chronic intermittent right flank pain and was going to have ureteroscopy and then finally lithotripsy but they canceled things because of she was on an anti-inflammatory and for family reasons. She was seen yesterday because of left flank pain and nausea and retching. No fever. No blood in the urine recently. No cystitis symptoms. She has had a number lithotripsies and ureteroscopies. She does not do that well with a stent. She has had a stent recently. She has a lifelong stone history were since her gastric bypass surgery.     Patient had a CT scan 01/26/2019 and has a 17 mm right renal stone with moderate  hydronephrosis and some peri-ureteral edema unchanged from January 2020. Patient has layering of stones in the right lower pole and patient has a 6-7 x 4 mm stone in the proximal left ureter with mild hydronephrosis. She has 5 renal stones in the lower pole on the left side. HU ~ 1200 of the right pelvic stone and I believe they are visible on the scout. She felt better and canceled the stent yesterday to avoid a stent.   Her urinalysis revealed greater than 30 red blood cells, few bacteria, no white cells. Creatinine was 0.79, white count 6.   Today, the pain has moved down into the left lower back. She is taking acetaminophen. No fever or dysuria. She also feels some pressure in the urethra like she is "passing something prickly". KUB today confirms the stone is in the left UVJ. Right renal pelvic stone noted.     ALLERGIES: latex Levemir NSAIDs    MEDICATIONS: Ibuprofen  Tylenol     GU PSH: None   NON-GU PSH: Cholecystectomy (laparoscopic)    GU PMH: None   NON-GU PMH: Diabetes Type 2 Hypertension Sleep Apnea    FAMILY HISTORY: Kidney Stones - Runs in Family   SOCIAL HISTORY: Marital Status: Unknown Preferred Language: English; Race: White Current Smoking Status: Patient has never smoked.   Tobacco Use Assessment Completed: Used Tobacco in last 30 days?     Notes: 2 daughters    REVIEW OF SYSTEMS:    GU Review Female:   Patient reports stream starts  and stops, trouble starting your stream, and have to strain to urinate. Patient denies frequent urination, hard to postpone urination, burning /pain with urination, get up at night to urinate, leakage of urine, and being pregnant.  Gastrointestinal (Upper):   Patient reports nausea and vomiting. Patient denies indigestion/ heartburn.  Gastrointestinal (Lower):   Patient denies diarrhea and constipation.  Constitutional:   Patient denies fever, night sweats, weight loss, and fatigue.  Skin:   Patient denies skin rash/ lesion  and itching.  Eyes:   Patient denies blurred vision and double vision.  Ears/ Nose/ Throat:   Patient denies sore throat and sinus problems.  Hematologic/Lymphatic:   Patient reports easy bruising. Patient denies swollen glands.  Cardiovascular:   Patient denies leg swelling and chest pains.  Respiratory:   Patient denies cough and shortness of breath.  Endocrine:   Patient denies excessive thirst.  Musculoskeletal:   Patient reports back pain. Patient denies joint pain.  Neurological:   Patient denies headaches and dizziness.  Psychologic:   Patient denies depression and anxiety.   VITAL SIGNS:      01/27/2019 10:28 AM  Weight 178 lb / 80.74 kg  Height 68 in / 172.72 cm  BP 175/95 mmHg  Pulse 64 /min  Temperature 98.3 F / 36.8 C  BMI 27.1 kg/m   MULTI-SYSTEM PHYSICAL EXAMINATION:    Constitutional: Well-nourished. No physical deformities. Normally developed. Good grooming. Pacing the room and holding her left lower back, but after Toradol sitting comfortably in chair.  Neck: Neck symmetrical, not swollen. Normal tracheal position.  Respiratory: No labored breathing, no use of accessory muscles.   Cardiovascular: Normal temperature, normal extremity pulses, no swelling, no varicosities.  Skin: No paleness, no jaundice, no cyanosis. No lesion, no ulcer, no rash.  Neurologic / Psychiatric: Oriented to time, oriented to place, oriented to person. No depression, no anxiety, no agitation.  Gastrointestinal: No mass, no tenderness, no rigidity, non obese abdomen.  Eyes: Normal conjunctivae. Normal eyelids.  Ears, Nose, Mouth, and Throat: Left ear no scars, no lesions, no masses. Right ear no scars, no lesions, no masses. Nose no scars, no lesions, no masses. Normal hearing. Normal lips.  Musculoskeletal: Normal gait and station of head and neck.     PAST DATA REVIEWED:  Source Of History:  Patient  X-Ray Review: C.T. Abdomen/Pelvis: Reviewed Films. 01/26/2019     PROCEDURES:          KUB - [id number]  A single view of the abdomen is obtained.  Calculi:  Stable large stones in the right renal pelvis and right lower pole. Small 3 mm left lower pole stones. The 5-6 mm left ureteral stone has migrated to the left ureterovesical junction.      The bones appeared normal. The bowel gas pattern appeared normal. The soft tissues were unremarkable. ---- Patient confirmed No Neulasta OnPro Device.            Urinalysis w/Scope Dipstick Dipstick Cont'd Micro  Color: Amber Bilirubin: Neg mg/dL WBC/hpf: 6 - 10/hpf  Appearance: Cloudy Ketones: Neg mg/dL RBC/hpf: >60/hpf  Specific Gravity: 1.025 Blood: 3+ ery/uL Bacteria: Few (10-25/hpf)  pH: 6.0 Protein: 1+ mg/dL Cystals: NS (Not Seen)  Glucose: Neg mg/dL Urobilinogen: 0.2 mg/dL Casts: NS (Not Seen)    Nitrites: Neg Trichomonas: Not Present    Leukocyte Esterase: 1+ leu/uL Mucous: Not Present      Epithelial Cells: 0 - 5/hpf      Yeast: NS (Not Seen)  Sperm: Not Present         Ketoralac 30mg  - [id number], [id number] Left hip was cleaned using sterile technique. A bandaid was applied. Pt tolerated well.   Qty: 30 Adm. By: Alferd Patee  Unit: mg Lot No [id number]  Route: IM Exp. Date 04/09/2020  Freq: None Mfgr.:   Site: Left Hip   ASSESSMENT:      ICD-10 Details  1 GU:   Ureteral calculus - N20.1   2   Renal calculus - N20.0    PLAN:            Medications New Meds: Alfuzosin Hcl Er 10 mg tablet, extended release 24 hr 1 tablet PO Daily   #21  0 Refill(s)  Oxycodone Hcl 5 mg tablet 1 tablet PO Q 6 H PRN   #12  0 Refill(s)            Orders Labs Urine Culture  X-Rays: KUB          Schedule Return Visit/Planned Activity: Next Available Appointment - Schedule Surgery          Document Letter(s):  Created for Patient: Clinical Summary         Notes:   Left ureteral calculus - she was given a low dose of Toradol 30 mg IM. KUB today with stable right UPJ stone and the left stone has migrated to the left  ureterovesical junction. I discussed with her the nature risks benefits and alternatives left ureteroscopy, laser lithotripsy and stent placement. All questions answered. Again, she wants to do everything to avoid a stent which is why she did not have a stent yesterday. We also discussed shockwave lithotripsy which is feasible for the left distal stone and she would like to do that. She asked if she does pass the stone on the left could we do the right UPJ stone since she will be at the [facility] unit and her right ESWL was canceled in the past.   Right UPJ stone - as above if she passes the left distal stone and she would like the right UPJ stone treated Monday. This is reasonable but we discussed the right stone is much larger and she has a higher risk of needing a stage procedure on the right side as well as the risk of bleeding among others would be higher with that stone. All questions answered and she elects to proceed. I sent a prescription for alfuzosin and oxycodone. I checked PMP aware.   right renal stones, left renal stones-continue surveillance   cc: Dr. Leanna Sato        Next Appointment:      Next Appointment: 01/30/2019 03:30 PM    Appointment Type: Surgery     Location: [facility] [phone]    Provider: Festus Aloe, M.D.    Reason for Visit: WL/OP LT ESWL      * Signed by Festus Aloe, M.D. on 01/28/19 at 5:22 AM (EDT)*     The information contained in this medical record document is considered private and confidential patient information. This information can only be used for the medical diagnosis and/or medical services that are being provided by the patient's selected caregivers. This information can only be distributed outside of the patient's care if the patient agrees and signs waivers of authorization for this information to be sent to an outside source or route.

## 2019-01-31 ENCOUNTER — Encounter (HOSPITAL_COMMUNITY): Payer: Self-pay | Admitting: Urology

## 2019-02-08 ENCOUNTER — Encounter: Payer: Self-pay | Admitting: Family

## 2019-02-08 ENCOUNTER — Telehealth: Payer: Self-pay | Admitting: Family Medicine

## 2019-02-08 NOTE — Telephone Encounter (Signed)
Patient needs follow up appt here  Has not been seen here in clinic since 06/2017  Due for:  A1c and fasting labs  Colonoscopy  Pap smear  Foot exam  Urine microalbumin  Opthalmology exam for diabetics  Mammogram due 05/2019

## 2019-02-10 NOTE — Telephone Encounter (Signed)
Left VM asking if patient was still following with Joycelyn Schmid & asked to call back for f/u.

## 2019-02-13 ENCOUNTER — Encounter: Payer: Self-pay | Admitting: Family

## 2019-02-13 ENCOUNTER — Ambulatory Visit (INDEPENDENT_AMBULATORY_CARE_PROVIDER_SITE_OTHER): Payer: Managed Care, Other (non HMO) | Admitting: Family

## 2019-02-13 DIAGNOSIS — Z9884 Bariatric surgery status: Secondary | ICD-10-CM

## 2019-02-13 DIAGNOSIS — R413 Other amnesia: Secondary | ICD-10-CM | POA: Insufficient documentation

## 2019-02-13 NOTE — Patient Instructions (Signed)
Nice speaking with you today.  As discussed will refer you to concussion clinic, Dr. Gardenia Phlegm in [place].    Please ensure you get a phone call from [place] in regards this appointment as well as your MRI Brain.  Labs when you can-please send [place] a fax number for your [facility] clinic so we may send them over this week.  Please let me know of any new or worsening symptoms.
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
## 2019-02-13 NOTE — Assessment & Plan Note (Signed)
Due for follow-up with Dr. Darnell Level.  Has stopped taking multivitamin.  Pending vitamin assessment, and extensive labs today.
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
## 2019-02-13 NOTE — Telephone Encounter (Signed)
Call patient I was reviewing her chart after we hung up the phone.  I reviewed the cervical spine x-ray again by Dr. Joya Gaskins.  I did notice a couple things.  It Mentioned possible atherosclerotic disease of the carotid arteries.    For this, I would advise further clarification with an ultrasound of the carotid artery. It Also mentioned osteopenia.  Usually we start doing bone density scans age 53 however with this being seen, I would recommend a DEXA scan as well.   Is  patient agreeable for me ordering ultrasound of carotid arteries and also DEXA scan?  Please let me know

## 2019-02-14 ENCOUNTER — Encounter: Payer: Self-pay | Admitting: Family

## 2019-02-14 ENCOUNTER — Telehealth: Payer: Self-pay

## 2019-02-14 NOTE — Telephone Encounter (Signed)
Scheduled tomorrow

## 2019-02-14 NOTE — Telephone Encounter (Signed)
Left message to call back in regards to getting appointment in concussion clinic.

## 2019-02-15 ENCOUNTER — Ambulatory Visit (INDEPENDENT_AMBULATORY_CARE_PROVIDER_SITE_OTHER): Payer: Managed Care, Other (non HMO) | Admitting: Family Medicine

## 2019-02-15 ENCOUNTER — Telehealth: Payer: Self-pay

## 2019-02-15 ENCOUNTER — Other Ambulatory Visit: Payer: Self-pay | Admitting: Family

## 2019-02-15 ENCOUNTER — Encounter: Payer: Self-pay | Admitting: Family Medicine

## 2019-02-15 DIAGNOSIS — Z0000 Encounter for general adult medical examination without abnormal findings: Secondary | ICD-10-CM

## 2019-02-15 DIAGNOSIS — R413 Other amnesia: Secondary | ICD-10-CM

## 2019-02-15 DIAGNOSIS — M2550 Pain in unspecified joint: Secondary | ICD-10-CM

## 2019-02-15 NOTE — Progress Notes (Signed)
[name] [place] [address] [place], [place] [address] Phone: [phone] Subjective:   Virtual Visit via Video Note  I connected with [name] on 02/15/19 at 11:00 AM EDT by a video enabled telemedicine application and verified that I am speaking with the correct person using two identifiers.   I discussed the limitations of evaluation and management by telemedicine and the availability of in person appointments. The patient expressed understanding and agreed to proceed.      I discussed the assessment and treatment plan with the patient. The patient was provided an opportunity to ask questions and all were answered. The patient agreed with the plan and demonstrated an understanding of the instructions.   The patient was advised to call back or seek an in-person evaluation if the symptoms worsen or if the condition fails to improve as anticipated.  I provided 46 minutes of non-face-to-face time during this encounter.  This includes differential and we discussed patient's history in great detail.  This included discussing the prognosis of patient's symptoms as well as breathing treatment options that are available.   Sarah Pulley, DO  More detailed note below   I'm seeing this patient by the request  of:  Sarah Hawthorne, FNP   CC: Headache  [id number]  [name] is a 53 y.o. female coming in with complaint of memory issues.  Past medical history significant for gastric bypass patient states that she has had memory issues as well as difficulty with multitasking for approximately 2 months.  Patient states that unfortunately her daughters have noticed this as well.  Patient's mother is being treated for a B-cell lymphoma of the brain at the moment that is increasing her anxiety.  Patient has had some difficulty with sleeping only sleeping approximately 4 to 6 hours at night and only 30 minutes otherwise.  States that she has  been more frustrated and having difficulties staying on track at work.  Patient is working 2 different jobs at this time. Patient did initially seen primary care provider and an MRI of the brain with and without contrast has been ordered and scheduled for Mar 15, 2019.  Sent here for further evaluation in the interim    Past Medical History:  Diagnosis Date   Cancer ([place])    skin cancer on nose   Diabetes mellitus without complication ([place])    resolved since gastric bypass in 2017-NO MEDS   Hyperlipidemia    Hypertension    Kidney stone    Migraine    Has used imitrex-last migraine in 2013   MRSA carrier    Sleep apnea    had gastric bypass surgery in 2017 and no longer needs CPAP   Past Surgical History:  Procedure Laterality Date   BARIATRIC SURGERY  2017   BREAST BIOPSY Right 2017   FIBROCYSTIC CHANGES WITH CALCIFICATIONS   CESAREAN SECTION     CHOLECYSTECTOMY  dec2017   CYSTOSCOPY WITH STENT PLACEMENT Right 09/10/2017   Procedure: CYSTOSCOPY WITH STENT PLACEMENT;  Surgeon: Abbie Sons, MD;  Location: [facility];  Service: Urology;  Laterality: Right;   EXTRACORPOREAL SHOCK WAVE LITHOTRIPSY N/A 01/30/2019   Procedure: EXTRACORPOREAL SHOCK WAVE LITHOTRIPSY (ESWL) LEFT/ POSSIBLE RIGHT;  Surgeon: Festus Aloe, MD;  [facility];  Service: Urology;  Laterality: N/A;   TONSILLECTOMY AND ADENOIDECTOMY     URETEROSCOPY WITH HOLMIUM LASER LITHOTRIPSY Right 09/10/2017   Procedure: URETEROSCOPY WITH HOLMIUM LASER LITHOTRIPSY;  Surgeon: John Giovanni  C, MD;  Location: [facility];  Service: Urology;  Laterality: Right;   Social History   Socioeconomic History   Marital status: Single    Spouse name: Not on file   Number of children: 2   Years of education: 14   Highest education level: Not on file  Occupational History   Occupation: Microbiology Supervisor    Employer: [employer]   Occupation: Teaches for [place]: Through [employer]   Occupation:  Teaches microbiology   Occupation: Optometrist    Comment: Microbiology - Weekend  Social Designer, fashion/clothing strain: Not on file   Food insecurity:    Worry: Not on file    Inability: Not on file   Transportation needs:    Medical: Not on file    Non-medical: Not on file  Tobacco Use   Smoking status: Never Smoker   Smokeless tobacco: Never Used  Substance and Sexual Activity   Alcohol use: No    Alcohol/week: 0.0 standard drinks   Drug use: No   Sexual activity: Yes    Birth control/protection: None, Post-menopausal  Lifestyle   Physical activity:    Days per week: Not on file    Minutes per session: Not on file   Stress: Not on file  Relationships   Social connections:    Talks on phone: Not on file    Gets together: Not on file    Attends religious service: Not on file    Active member of club or organization: Not on file    Attends meetings of clubs or organizations: Not on file    Relationship status: Not on file  Other Topics Concern   Not on file  Social History Narrative   [name] grew up partly in [place] and then [place]. She lives in [place] with her two daughter. 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 She enjoys the outdoors, gardening.    Allergies  Allergen Reactions   Latex Shortness Of Breath and Cough   Tolmetin Hives   Levemir [Insulin Detemir]     Hives   Nsaids Hives   Family History  Problem Relation Age of Onset   Alcohol abuse Mother    Hyperlipidemia Mother    Hypertension Mother    Lymphoma Mother        primary site : brain   Hyperlipidemia Father    Hypertension Father    Parkinson's disease Father    Drug abuse Sister    Cancer Maternal Grandmother        breast cancer   Breast cancer Maternal Grandmother    Cancer Paternal Grandmother    Kidney cancer Neg Hx    Prostate cancer Neg Hx          Current Outpatient Medications (Other):     TURMERIC PO, Take 95 mg by mouth daily.   UNABLE TO FIND, Take 2 capsules by mouth daily. Adrenal/T    Past medical history, social, surgical and family history all reviewed in electronic medical record.  No pertanent information unless stated regarding to the chief complaint.   Review of Systems:  No , visual changes, nausea, vomiting, diarrhea, constipation, dizziness, abdominal pain, skin rash, fevers, chills, night sweats, weight loss, swollen lymph nodes, body aches, joint swelling, muscle aches, chest pain, shortness of breath, mood changes.  Mild positive headache, memory issues  Objective  Virtual visit no vital signs taken  General: No apparent distress alert and  oriented x3 mood and affect normal, dressed appropriately.  HEENT: Pupils equal, extraocular movements intact  Respiratory: Patient's speak in full sentences and does not appear short of breath  Cardiovascular: No lower extremity edema, non tender, no erythema      Impression and Recommendations:     This case required medical decision making of moderate complexity. The above documentation has been reviewed and is accurate and complete Sarah Pulley, DO       Note: This dictation was prepared with Dragon dictation along with smaller phrase technology. Any transcriptional errors that result from this process are unintentional.

## 2019-02-15 NOTE — Telephone Encounter (Signed)
We received fax from [facility] stating that MRI was approved 02/14/19. [employer] had reviewed & decided that it was medically necessary.

## 2019-02-15 NOTE — Telephone Encounter (Signed)
Left message for patient to call back to schedule for 2 weeks, virtual visit. Also need to get fax number to location we are sending her lab orders.

## 2019-02-15 NOTE — Assessment & Plan Note (Signed)
Patient has had some difficulty with memory recently.  Been going on 2 months.  It does appear though with the coronavirus as well as her mother being sick that I do believe that patient has had significant amount of stress as well.  Patient seems to have some sleep deprivation as well that could be contributing.  Discussed with patient in great length as well as some of her other vitamin deficiencies.  History of gastric bypass also decreases absorption of certain vitamins and minerals and I would like further evaluation with laboratory work-up.  These were ordered today.  We discussed icing regimen and home exercises.  We discussed staying active.  We discussed over-the-counter medications that could be beneficial.  Patient is going to try to make these different changes patient will follow-up with another virtual visit in 2 weeks.

## 2019-02-21 ENCOUNTER — Encounter: Payer: Self-pay | Admitting: Family

## 2019-03-01 ENCOUNTER — Other Ambulatory Visit: Payer: Self-pay | Admitting: Family

## 2019-03-01 DIAGNOSIS — R748 Abnormal levels of other serum enzymes: Secondary | ICD-10-CM

## 2019-03-01 LAB — VITAMIN A: Vitamin A: 20 ug/dL — ABNORMAL LOW (ref 20.1–62.0)

## 2019-03-01 LAB — COMPREHENSIVE METABOLIC PANEL
ALT: 74 IU/L — ABNORMAL HIGH (ref 0–32)
AST: 65 IU/L — ABNORMAL HIGH (ref 0–40)
Albumin/Globulin Ratio: 1.4 (ref 1.2–2.2)
Albumin: 3.6 g/dL — ABNORMAL LOW (ref 3.8–4.9)
Alkaline Phosphatase: 178 IU/L — ABNORMAL HIGH (ref 39–117)
BUN/Creatinine Ratio: 13 (ref 9–23)
BUN: 8 mg/dL (ref 6–24)
Bilirubin Total: 0.3 mg/dL (ref 0.0–1.2)
CO2: 25 mmol/L (ref 20–29)
Calcium: 9 mg/dL (ref 8.7–10.2)
Chloride: 105 mmol/L (ref 96–106)
Creatinine, Ser: 0.62 mg/dL (ref 0.57–1.00)
GFR calc Af Amer: 120 mL/min/{1.73_m2} (ref >59)
GFR calc non Af Amer: 104 mL/min/{1.73_m2} (ref >59)
Globulin, Total: 2.6 g/dL (ref 1.5–4.5)
Glucose: 79 mg/dL (ref 65–99)
Potassium: 4.1 mmol/L (ref 3.5–5.2)
Sodium: 145 mmol/L — ABNORMAL HIGH (ref 134–144)
Total Protein: 6.2 g/dL (ref 6.0–8.5)

## 2019-03-01 LAB — B12 AND FOLATE PANEL
Folate: 5.2 ng/mL
Vitamin B-12: 1036 pg/mL (ref 232–1245)

## 2019-03-01 LAB — CBC WITH DIFFERENTIAL/PLATELET
Basophils Absolute: 0 10*3/uL (ref 0.0–0.2)
Basos: 1 %
EOS (ABSOLUTE): 0.5 10*3/uL — ABNORMAL HIGH (ref 0.0–0.4)
Eos: 12 %
Hematocrit: 38.5 % (ref 34.0–46.6)
Hemoglobin: 12.4 g/dL (ref 11.1–15.9)
Immature Grans (Abs): 0 10*3/uL (ref 0.0–0.1)
Immature Granulocytes: 0 %
Lymphocytes Absolute: 1.7 10*3/uL (ref 0.7–3.1)
Lymphs: 40 %
MCH: 28.7 pg (ref 26.6–33.0)
MCHC: 32.2 g/dL (ref 31.5–35.7)
MCV: 89 fL (ref 79–97)
Monocytes Absolute: 0.3 10*3/uL (ref 0.1–0.9)
Monocytes: 7 %
Neutrophils Absolute: 1.7 10*3/uL (ref 1.4–7.0)
Neutrophils: 40 %
Platelets: 177 10*3/uL (ref 150–450)
RBC: 4.32 x10E6/uL (ref 3.77–5.28)
RDW: 13 % (ref 11.7–15.4)
WBC: 4.2 10*3/uL (ref 3.4–10.8)

## 2019-03-01 LAB — TSH: TSH: 2.08 u[IU]/mL (ref 0.450–4.500)

## 2019-03-01 LAB — SELENIUM SERUM: Selenium, S/P: 116 ug/L (ref 91–198)

## 2019-03-01 LAB — LIPID PANEL
Chol/HDL Ratio: 2.7 ratio (ref 0.0–4.4)
Cholesterol, Total: 118 mg/dL (ref 100–199)
HDL: 44 mg/dL (ref >39)
LDL Calculated: 63 mg/dL (ref 0–99)
Triglycerides: 56 mg/dL (ref 0–149)
VLDL Cholesterol Cal: 11 mg/dL (ref 5–40)

## 2019-03-01 LAB — HEMOGLOBIN A1C
Est. average glucose Bld gHb Est-mCnc: 94 mg/dL
Hgb A1c MFr Bld: 4.9 % (ref 4.8–5.6)

## 2019-03-01 LAB — VITAMIN E
Vitamin E (Alpha Tocopherol): 5.5 mg/L — ABNORMAL LOW (ref 7.0–25.1)
Vitamin E(Gamma Tocopherol): 0.5 mg/L (ref 0.5–5.5)

## 2019-03-01 LAB — ZINC: Zinc: 57 ug/dL (ref 56–134)

## 2019-03-01 LAB — RPR: RPR Ser Ql: NONREACTIVE

## 2019-03-01 LAB — VITAMIN K1, SERUM

## 2019-03-01 LAB — VITAMIN C

## 2019-03-01 LAB — T3, FREE: T3, Free: 2.7 pg/mL (ref 2.0–4.4)

## 2019-03-02 ENCOUNTER — Other Ambulatory Visit: Payer: Self-pay | Admitting: Urology

## 2019-03-06 ENCOUNTER — Other Ambulatory Visit: Payer: Self-pay

## 2019-03-06 ENCOUNTER — Encounter (HOSPITAL_COMMUNITY): Payer: Self-pay | Admitting: *Deleted

## 2019-03-06 NOTE — Progress Notes (Signed)
Called and completed preprocedure phone call for lithotripsy scheduled on 03/09/19.  Patient aware Valet parking starts at 54am and daughter can not comeinto building.  Patient to bring daughter, jordan's phone number day of procedure along with completed blue packet from [employer] on day of procedure.   SPOKE W/  _patient      SCREENING SYMPTOMS OF COVID 19:   COUGH--no  RUNNY NOSE--- no  SORE THROAT---no  NASAL CONGESTION----no  SNEEZING----no  SHORTNESS OF BREATH---no  DIFFICULTY BREATHING---no  TEMP >100.4-----no  UNEXPLAINED BODY ACHES------no   HAVE YOU OR ANY FAMILY MEMBER TRAVELLED PAST 14 DAYS OUT OF THE   COUNTY---no STATE----no COUNTRY----no  HAVE YOU OR ANY FAMILY MEMBER BEEN EXPOSED TO ANYONE WITH COVID 19?  No Patient's weight is approximately 179 lbs per patient.  Patient aware to wear 2 pieces of clothing and no belts.  Patient denies currently taking any aspirin products , blood thinners or nsaids.

## 2019-03-07 ENCOUNTER — Encounter: Payer: Self-pay | Admitting: Family Medicine

## 2019-03-07 ENCOUNTER — Other Ambulatory Visit: Payer: Self-pay | Admitting: Family Medicine

## 2019-03-08 LAB — PTH, INTACT AND CALCIUM
Calcium: 8.9 mg/dL (ref 8.7–10.2)
PTH: 73 pg/mL — ABNORMAL HIGH (ref 15–65)

## 2019-03-08 LAB — CALCIUM, IONIZED: Calcium, Ion: 4.7 mg/dL (ref 4.5–5.6)

## 2019-03-08 LAB — IRON AND TIBC
Iron Saturation: 18 % (ref 15–55)
Iron: 58 ug/dL (ref 27–159)
Total Iron Binding Capacity: 330 ug/dL (ref 250–450)
UIBC: 272 ug/dL (ref 131–425)

## 2019-03-08 LAB — FERRITIN: Ferritin: 36 ng/mL (ref 15–150)

## 2019-03-08 LAB — ANA: Anti Nuclear Antibody (ANA): NEGATIVE

## 2019-03-08 LAB — VITAMIN D 25 HYDROXY (VIT D DEFICIENCY, FRACTURES): Vit D, 25-Hydroxy: 23.5 ng/mL — ABNORMAL LOW (ref 30.0–100.0)

## 2019-03-08 LAB — RHEUMATOID FACTOR: Rheumatoid fact SerPl-aCnc: <10 IU/mL (ref 0.0–13.9)

## 2019-03-08 LAB — URIC ACID: Uric Acid: 3.5 mg/dL (ref 2.5–7.1)

## 2019-03-08 LAB — SEDIMENTATION RATE: Sed Rate: 2 mm/hr (ref 0–40)

## 2019-03-13 ENCOUNTER — Ambulatory Visit (HOSPITAL_COMMUNITY): Admission: RE | Admit: 2019-03-13 | Payer: Managed Care, Other (non HMO) | Source: Home / Self Care | Admitting: Urology

## 2019-03-13 ENCOUNTER — Other Ambulatory Visit: Payer: Self-pay | Admitting: Family

## 2019-03-13 HISTORY — DX: Presence of spectacles and contact lenses: Z97.3

## 2019-03-13 SURGERY — LITHOTRIPSY, ESWL
Anesthesia: LOCAL | Laterality: Right

## 2019-03-14 LAB — COMPREHENSIVE METABOLIC PANEL
ALT: 62 IU/L — ABNORMAL HIGH (ref 0–32)
AST: 63 IU/L — ABNORMAL HIGH (ref 0–40)
Albumin/Globulin Ratio: 1.6 (ref 1.2–2.2)
Albumin: 3.6 g/dL — ABNORMAL LOW (ref 3.8–4.9)
Alkaline Phosphatase: 149 IU/L — ABNORMAL HIGH (ref 39–117)
BUN/Creatinine Ratio: 18 (ref 9–23)
BUN: 13 mg/dL (ref 6–24)
Bilirubin Total: 0.2 mg/dL (ref 0.0–1.2)
CO2: 23 mmol/L (ref 20–29)
Calcium: 8.4 mg/dL — ABNORMAL LOW (ref 8.7–10.2)
Chloride: 108 mmol/L — ABNORMAL HIGH (ref 96–106)
Creatinine, Ser: 0.74 mg/dL (ref 0.57–1.00)
GFR calc Af Amer: 108 mL/min/{1.73_m2} (ref >59)
GFR calc non Af Amer: 93 mL/min/{1.73_m2} (ref >59)
Globulin, Total: 2.3 g/dL (ref 1.5–4.5)
Glucose: 83 mg/dL (ref 65–99)
Potassium: 4.4 mmol/L (ref 3.5–5.2)
Sodium: 142 mmol/L (ref 134–144)
Total Protein: 5.9 g/dL — ABNORMAL LOW (ref 6.0–8.5)

## 2019-03-15 ENCOUNTER — Ambulatory Visit
Admission: RE | Admit: 2019-03-15 | Discharge: 2019-03-15 | Disposition: A | Discharge status: Home / Self Care | Payer: Managed Care, Other (non HMO) | Source: Ambulatory Visit | Attending: Family | Admitting: Family

## 2019-03-15 ENCOUNTER — Other Ambulatory Visit: Payer: Self-pay | Admitting: Urology

## 2019-03-15 ENCOUNTER — Other Ambulatory Visit: Payer: Self-pay

## 2019-03-15 DIAGNOSIS — R413 Other amnesia: Secondary | ICD-10-CM | POA: Insufficient documentation

## 2019-03-15 MED ORDER — DIPHENHYDRAMINE HCL 25 MG PO CAPS
25.0000 mg | ORAL_CAPSULE | Freq: Once | ORAL | Status: AC
  Administered 2019-03-15: 25 mg via ORAL
  Filled 2019-03-15: qty 1

## 2019-03-15 MED ORDER — GADOBUTROL 1 MMOL/ML IV SOLN
8.0000 mL | Freq: Once | INTRAVENOUS | Status: AC | PRN
  Administered 2019-03-15: 8 mL via INTRAVENOUS

## 2019-03-15 NOTE — Progress Notes (Signed)
Dr Kathlene Cote notified of patient status and VS.  Still itching but improved.  order to discharge patient home with continued benadryl 25 mg PO as soon as arrival home and then q 4 hr until no more itching.  Pt aware and agrees with plan.  D/c home stable.

## 2019-03-15 NOTE — Discharge Instructions (Signed)
Take  25 mg Benadryl as soon as you get home and every 4 hours until no itching.  If symptoms worsen go directly to ER

## 2019-03-15 NOTE — Progress Notes (Signed)
Pt experienced excessive itching post MRI.  Given 25 mg benedryl PO per order Dr Kathlene Cote and transferred via w/c to [facility] for monitoring.

## 2019-03-16 ENCOUNTER — Other Ambulatory Visit: Payer: Self-pay | Admitting: Urology

## 2019-03-16 ENCOUNTER — Encounter (HOSPITAL_COMMUNITY): Payer: Self-pay | Admitting: General Practice

## 2019-03-17 ENCOUNTER — Other Ambulatory Visit: Payer: Self-pay

## 2019-03-17 ENCOUNTER — Other Ambulatory Visit (HOSPITAL_COMMUNITY)
Admission: RE | Admit: 2019-03-17 | Discharge: 2019-03-17 | Disposition: A | Discharge status: Home / Self Care | Payer: Managed Care, Other (non HMO) | Source: Ambulatory Visit | Attending: Urology | Admitting: Urology

## 2019-03-17 DIAGNOSIS — Z1159 Encounter for screening for other viral diseases: Secondary | ICD-10-CM | POA: Insufficient documentation

## 2019-03-18 LAB — NOVEL CORONAVIRUS, NAA (HOSP ORDER, SEND-OUT TO REF LAB; TAT 18-24 HRS): SARS-CoV-2, NAA: NOT DETECTED

## 2019-03-19 NOTE — H&P (Signed)
H&P  Chief Complaint: Large right renal stone  History of Present Illness: [name] is a 53 y.o. year old female who had recent ESL of a left ureteral stone. She now presents for staged ESL of a 20 mm right renal pelvic stone--SSD 9.5 mm, HU about 1200.  Past Medical History:  Diagnosis Date  . Cancer ([place])    skin cancer on nose  . Diabetes mellitus without complication ([place])    resolved since gastric bypass in 2017-NO MEDS  . History of kidney stones   . Hyperlipidemia   . Hypertension    denies hypertension at preprocedure phone call on 03/06/2019  . Kidney stone   . Migraine    Has used imitrex-last migraine in 2013  . MRSA carrier   . Sleep apnea    had gastric bypass surgery in 2017 and no longer needs CPAP  . Wears glasses     Past Surgical History:  Procedure Laterality Date  . BARIATRIC SURGERY  2017  . BREAST BIOPSY Right 2017   FIBROCYSTIC CHANGES WITH CALCIFICATIONS  . CESAREAN SECTION    . CHOLECYSTECTOMY  [id number]  . CYSTOSCOPY WITH STENT PLACEMENT Right 09/10/2017   Procedure: CYSTOSCOPY WITH STENT PLACEMENT;  Surgeon: Abbie Sons, MD;  Location: [facility];  Service: Urology;  Laterality: Right;  . EXTRACORPOREAL SHOCK WAVE LITHOTRIPSY N/A 01/30/2019   Procedure: EXTRACORPOREAL SHOCK WAVE LITHOTRIPSY (ESWL) LEFT/ POSSIBLE RIGHT;  Surgeon: Festus Aloe, MD;  [facility];  Service: Urology;  Laterality: N/A;  . TONSILLECTOMY AND ADENOIDECTOMY    . URETEROSCOPY WITH HOLMIUM LASER LITHOTRIPSY Right 09/10/2017   Procedure: URETEROSCOPY WITH HOLMIUM LASER LITHOTRIPSY;  Surgeon: Abbie Sons, MD;  Location: [facility];  Service: Urology;  Laterality: Right;    Home Medications:  No medications prior to admission.    Allergies:  Allergies  Allergen Reactions  . Latex Shortness Of Breath and Cough  . Tolmetin Hives  . Levemir [Insulin Detemir]     Hives  . Nsaids Hives  . Gadolinium Derivatives Hives    Pt c/o itching after contrast  injection 03/15/19 @ 8:45am. [facility] Per Dr. Kathlene Cote document this as allergic reaction.     Family History  Problem Relation Age of Onset  . Alcohol abuse Mother   . Hyperlipidemia Mother   . Hypertension Mother   . Lymphoma Mother        primary site : brain  . Hyperlipidemia Father   . Hypertension Father   . Parkinson's disease Father   . Drug abuse Sister   . Cancer Maternal Grandmother        breast cancer  . Breast cancer Maternal Grandmother   . Cancer Paternal Grandmother   . Kidney cancer Neg Hx   . Prostate cancer Neg Hx     Social History:  reports that she has never smoked. She has never used smokeless tobacco. She reports that she does not drink alcohol or use drugs.  ROS: A complete review of systems was performed.  All systems are negative except for pertinent findings as noted.  Physical Exam:  Vital signs in last 24 hours:   General:  Alert and oriented, No acute distress HEENT: Normocephalic, atraumatic Neck: No JVD or lymphadenopathy Cardiovascular: Regular rate and rhythm Lungs: Clear bilaterally Abdomen: Soft, nontender, nondistended, no abdominal masses Back: No CVA tenderness Extremities: No edema Neurologic: Grossly intact  Laboratory Data:  No results found for this or any previous visit (from the past 24 hour(s)). Recent  Results (from the past 240 hour(s))  Novel Coronavirus, NAA (hospital order; send-out to ref lab)     Status: None   Collection Time: 03/17/19 11:02 AM  Result Value Ref Range Status   SARS-CoV-2, NAA NOT DETECTED NOT DETECTED Final    Comment: (NOTE) This test was developed and its performance characteristics determined by [employer]. This test has not been FDA cleared or approved. This test has been authorized by FDA under an Emergency Use Authorization (EUA). This test is only authorized for the duration of time the declaration that circumstances exist justifying the authorization of the emergency use of in  vitro diagnostic tests for detection of SARS-CoV-2 virus and/or diagnosis of COVID-19 infection under section 564(b)(1) of the Act, 21 U.S.C. 882CMK-3(K)(9), unless the authorization is terminated or revoked sooner. When diagnostic testing is negative, the possibility of a false negative result should be considered in the context of a patient's recent exposures and the presence of clinical signs and symptoms consistent with COVID-19. An individual without symptoms of COVID-19 and who is not shedding SARS-CoV-2 virus would expect to have a negative (not detected) result in this assay. Performed  At: [facility] [address] [place], [place] [id number] Rush Farmer MD VX:[id number]    [place]  Final    Comment: Performed at [facility], [place] [address], [place], [place] [address]   Creatinine: Recent Labs    03/13/19 8675  CREATININE 0.74    Radiologic Imaging: No results found.  Impression/Assessment:  Large rt renal pelvic stone  Plan:  ESL. She is aware that this is part of a planned staged procedure due to the size/location of the stone  [name] 03/19/2019, 8:47 PM  [name]. [name] MD

## 2019-03-20 ENCOUNTER — Ambulatory Visit (HOSPITAL_COMMUNITY): Payer: Managed Care, Other (non HMO)

## 2019-03-20 ENCOUNTER — Ambulatory Visit (HOSPITAL_COMMUNITY)
Admission: RE | Admit: 2019-03-20 | Discharge: 2019-03-20 | Disposition: A | Discharge status: Home / Self Care | Payer: Managed Care, Other (non HMO) | Attending: Urology | Admitting: Urology

## 2019-03-20 ENCOUNTER — Encounter (HOSPITAL_COMMUNITY): Payer: Self-pay | Admitting: General Practice

## 2019-03-20 ENCOUNTER — Encounter (HOSPITAL_COMMUNITY)
Admission: RE | Disposition: A | Discharge status: Home / Self Care | Payer: Self-pay | Source: Home / Self Care | Attending: Urology

## 2019-03-20 DIAGNOSIS — Z886 Allergy status to analgesic agent status: Secondary | ICD-10-CM | POA: Diagnosis not present

## 2019-03-20 DIAGNOSIS — E119 Type 2 diabetes mellitus without complications: Secondary | ICD-10-CM | POA: Insufficient documentation

## 2019-03-20 DIAGNOSIS — Z803 Family history of malignant neoplasm of breast: Secondary | ICD-10-CM | POA: Insufficient documentation

## 2019-03-20 DIAGNOSIS — Z820 Family history of epilepsy and other diseases of the nervous system: Secondary | ICD-10-CM | POA: Insufficient documentation

## 2019-03-20 DIAGNOSIS — Z8614 Personal history of Methicillin resistant Staphylococcus aureus infection: Secondary | ICD-10-CM | POA: Diagnosis not present

## 2019-03-20 DIAGNOSIS — Z807 Family history of other malignant neoplasms of lymphoid, hematopoietic and related tissues: Secondary | ICD-10-CM | POA: Insufficient documentation

## 2019-03-20 DIAGNOSIS — I10 Essential (primary) hypertension: Secondary | ICD-10-CM | POA: Insufficient documentation

## 2019-03-20 DIAGNOSIS — G43909 Migraine, unspecified, not intractable, without status migrainosus: Secondary | ICD-10-CM | POA: Diagnosis not present

## 2019-03-20 DIAGNOSIS — G4730 Sleep apnea, unspecified: Secondary | ICD-10-CM | POA: Diagnosis not present

## 2019-03-20 DIAGNOSIS — Z87442 Personal history of urinary calculi: Secondary | ICD-10-CM | POA: Diagnosis not present

## 2019-03-20 DIAGNOSIS — Z91040 Latex allergy status: Secondary | ICD-10-CM | POA: Diagnosis not present

## 2019-03-20 DIAGNOSIS — Z808 Family history of malignant neoplasm of other organs or systems: Secondary | ICD-10-CM | POA: Diagnosis not present

## 2019-03-20 DIAGNOSIS — Z811 Family history of alcohol abuse and dependence: Secondary | ICD-10-CM | POA: Diagnosis not present

## 2019-03-20 DIAGNOSIS — Z85828 Personal history of other malignant neoplasm of skin: Secondary | ICD-10-CM | POA: Insufficient documentation

## 2019-03-20 DIAGNOSIS — N200 Calculus of kidney: Secondary | ICD-10-CM | POA: Diagnosis not present

## 2019-03-20 DIAGNOSIS — Z9884 Bariatric surgery status: Secondary | ICD-10-CM | POA: Diagnosis not present

## 2019-03-20 DIAGNOSIS — Z888 Allergy status to other drugs, medicaments and biological substances status: Secondary | ICD-10-CM | POA: Diagnosis not present

## 2019-03-20 DIAGNOSIS — N201 Calculus of ureter: Secondary | ICD-10-CM

## 2019-03-20 DIAGNOSIS — Z9049 Acquired absence of other specified parts of digestive tract: Secondary | ICD-10-CM | POA: Insufficient documentation

## 2019-03-20 DIAGNOSIS — E785 Hyperlipidemia, unspecified: Secondary | ICD-10-CM | POA: Insufficient documentation

## 2019-03-20 HISTORY — DX: Personal history of urinary calculi: Z87.442

## 2019-03-20 HISTORY — PX: EXTRACORPOREAL SHOCK WAVE LITHOTRIPSY: SHX1557

## 2019-03-20 SURGERY — LITHOTRIPSY, ESWL
Anesthesia: LOCAL | Laterality: Right

## 2019-03-20 MED ORDER — DIPHENHYDRAMINE HCL 25 MG PO CAPS
25.0000 mg | ORAL_CAPSULE | ORAL | Status: AC
  Administered 2019-03-20: 25 mg via ORAL
  Filled 2019-03-20: qty 1

## 2019-03-20 MED ORDER — SODIUM CHLORIDE 0.9 % IV SOLN
INTRAVENOUS | Status: DC
  Administered 2019-03-20: 07:00:00 via INTRAVENOUS

## 2019-03-20 MED ORDER — OXYCODONE HCL 5 MG PO TABS
5.0000 mg | ORAL_TABLET | Freq: Once | ORAL | Status: AC
  Administered 2019-03-20: 5 mg via ORAL

## 2019-03-20 MED ORDER — CIPROFLOXACIN HCL 500 MG PO TABS
500.0000 mg | ORAL_TABLET | ORAL | Status: AC
  Administered 2019-03-20: 500 mg via ORAL
  Filled 2019-03-20: qty 1

## 2019-03-20 MED ORDER — OXYCODONE HCL 5 MG PO TABS
ORAL_TABLET | ORAL | Status: AC
  Filled 2019-03-20: qty 1

## 2019-03-20 MED ORDER — OXYCODONE HCL 5 MG PO TABS: 5.0000 mg | ORAL_TABLET | ORAL | 0 refills | Status: DC | PRN

## 2019-03-20 MED ORDER — DIAZEPAM 5 MG PO TABS
10.0000 mg | ORAL_TABLET | ORAL | Status: AC
  Administered 2019-03-20: 10 mg via ORAL
  Filled 2019-03-20: qty 2

## 2019-03-20 SURGICAL SUPPLY — 4 items
COVER SURGICAL LIGHT HANDLE (MISCELLANEOUS) ×2 IMPLANT
COVER WAND RF STERILE (DRAPES) IMPLANT
KIT TURNOVER KIT A (KITS) IMPLANT
TOWEL OR 17X26 10 PK STRL BLUE (TOWEL DISPOSABLE) ×2 IMPLANT

## 2019-03-20 NOTE — Op Note (Signed)
See [facility] note scanned into chart. 

## 2019-03-20 NOTE — Discharge Instructions (Signed)
See [facility] discharge instructions in chart.  

## 2019-03-20 NOTE — Interval H&P Note (Signed)
History and Physical Interval Note:  03/20/2019 9:08 AM  [name]  has presented today for surgery, with the diagnosis of RIGHT URETEROPELVIC JUNCTION STONE.  The various methods of treatment have been discussed with the patient and family. After consideration of risks, benefits and other options for treatment, the patient has consented to  Procedure(s): EXTRACORPOREAL SHOCK WAVE LITHOTRIPSY (ESWL) (Right) as a surgical intervention.  The patient's history has been reviewed, patient examined, no change in status, stable for surgery.  I have reviewed the patient's chart and labs.  Questions were answered to the patient's satisfaction.     [name] Shawny Borkowski

## 2019-03-21 ENCOUNTER — Encounter (HOSPITAL_COMMUNITY): Payer: Self-pay | Admitting: Urology

## 2019-03-26 NOTE — Progress Notes (Signed)
[facility] [address], [place] [address] Phone: [phone] Subjective:   Virtual Visit via Video Note  I connected with [name] on 03/27/19 at 11:30 AM EDT by a video enabled telemedicine application and verified that I am speaking with the correct person using two identifiers.  Location: Patient: Patient was in her car  provider: I was in office setting   I discussed the limitations of evaluation and management by telemedicine and the availability of in person appointments. The patient expressed understanding and agreed to proceed.    I discussed the assessment and treatment plan with the patient. The patient was provided an opportunity to ask questions and all were answered. The patient agreed with the plan and demonstrated an understanding of the instructions.   The patient was advised to call back or seek an in-person evaluation if the symptoms worsen or if the condition fails to improve as anticipated.  I provided 27 minutes of face-to-face time during this encounter.   Lyndal Pulley, DO    CC: Memory loss follow-up  DTO:[id number]  [name] is a 53 y.o. female coming in with complaint of memory loss.  Patient was sent to get labs.  Independently visualized by me showing some mild decrease in soluble vitamins as well as vitamin D level but otherwise unremarkable.  Since we have seen patient patient did have some kidney stones and did have intervention for this.  Feeling a little better from that.  Patient is having increasing stress recently with a ailing parents.  Mother has a brain tumor and father does have Parkinson's.  Primary caregiver for that as well.  Not taking any time for herself.    Past Medical History:  Diagnosis Date  . Cancer ([place])    skin cancer on nose  . Diabetes mellitus without complication ([place])    resolved since gastric bypass in 2017-NO MEDS  . History of kidney stones   .  Hyperlipidemia   . Hypertension    denies hypertension at preprocedure phone call on 03/06/2019  . Kidney stone   . Migraine    Has used imitrex-last migraine in 2013  . MRSA carrier   . Sleep apnea    had gastric bypass surgery in 2017 and no longer needs CPAP  . Wears glasses    Past Surgical History:  Procedure Laterality Date  . BARIATRIC SURGERY  2017  . BREAST BIOPSY Right 2017   FIBROCYSTIC CHANGES WITH CALCIFICATIONS  . CESAREAN SECTION    . CHOLECYSTECTOMY  [id number]  . CYSTOSCOPY WITH STENT PLACEMENT Right 09/10/2017   Procedure: CYSTOSCOPY WITH STENT PLACEMENT;  Surgeon: Abbie Sons, MD;  Location: [facility];  Service: Urology;  Laterality: Right;  . EXTRACORPOREAL SHOCK WAVE LITHOTRIPSY N/A 01/30/2019   Procedure: EXTRACORPOREAL SHOCK WAVE LITHOTRIPSY (ESWL) LEFT/ POSSIBLE RIGHT;  Surgeon: Festus Aloe, MD;  Location: [facility];  Service: Urology;  Laterality: N/A;  . EXTRACORPOREAL SHOCK WAVE LITHOTRIPSY Right 03/20/2019   Procedure: EXTRACORPOREAL SHOCK WAVE LITHOTRIPSY (ESWL);  Surgeon: Franchot Gallo, MD;  Location: [facility];  Service: Urology;  Laterality: Right;  . TONSILLECTOMY AND ADENOIDECTOMY    . URETEROSCOPY WITH HOLMIUM LASER LITHOTRIPSY Right 09/10/2017   Procedure: URETEROSCOPY WITH HOLMIUM LASER LITHOTRIPSY;  Surgeon: Abbie Sons, MD;  Location: [facility];  Service: Urology;  Laterality: Right;   Social History   Socioeconomic History  . Marital status: Single    Spouse name: Not on file  .  Number of children: 2  . Years of education: 34  . Highest education level: Not on file  Occupational History  . Occupation: Mudlogger: [employer]  . 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 Occupation: Engineer, materials  . 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 Financial resource strain: Not on file  . Food insecurity:    Worry: Not on file    Inability: Not on  file  . Transportation needs:    Medical: Not on file    Non-medical: Not on file  Tobacco Use  . Smoking status: Never Smoker  . Smokeless tobacco: Never Used  Substance and Sexual Activity  . Alcohol use: No    Alcohol/week: 0.0 standard drinks  . Drug use: No  . Sexual activity: Yes    Birth control/protection: None, Post-menopausal  Lifestyle  . Physical activity:    Days per week: Not on file    Minutes per session: Not on file  . Stress: Not on file  Relationships  . Social connections:    Talks on phone: Not on file    Gets together: Not on file    Attends religious service: Not on file    Active member of club or organization: Not on file    Attends meetings of clubs or organizations: Not on file    Relationship status: Not on file  Other Topics Concern  . Not on file  Social History Narrative   [name] grew up partly in [place] and then [place]. She lives in [place] with her two daughter. 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 She enjoys the outdoors, gardening.    Allergies  Allergen Reactions  . Latex Shortness Of Breath and Cough  . Tolmetin Hives  . Levemir [Insulin Detemir]     Hives  . Nsaids Hives  . Gadolinium Derivatives Hives    Pt c/o itching after contrast injection 03/15/19 @ 8:45am. [facility] Per Dr. Kathlene Cote document this as allergic reaction.    Family History  Problem Relation Age of Onset  . Alcohol abuse Mother   . Hyperlipidemia Mother   . Hypertension Mother   . Lymphoma Mother        primary site : brain  . Hyperlipidemia Father   . Hypertension Father   . Parkinson's disease Father   . Drug abuse Sister   . Cancer Maternal Grandmother        breast cancer  . Breast cancer Maternal Grandmother   . Cancer Paternal Grandmother   . Kidney cancer Neg Hx   . Prostate cancer Neg Hx        Current Outpatient Medications (Analgesics):  .  oxyCODONE (ROXICODONE) 5 MG immediate release tablet, Take 1 tablet (5  mg total) by mouth every 4 (four) hours as needed for severe pain.   Current Outpatient Medications (Other):  .  cephALEXin (KEFLEX) 500 MG capsule, Take 500 mg by mouth 3 (three) times daily. Patient started on 02/13/2019 for a total of 7 days. .  hydrOXYzine (ATARAX/VISTARIL) 10 MG tablet, Take 1 tablet (10 mg total) by mouth 3 (three) times daily as needed. Marland Kitchen  OVER THE COUNTER MEDICATION,  .  TURMERIC PO, Take 95 mg by mouth daily.  Marland Kitchen  UNABLE TO FIND, Take 2 capsules by mouth daily. Adrenal/T .  Vitamin D, Ergocalciferol, (DRISDOL) 1.25 MG (50000 UT) CAPS capsule, Take  1 capsule (50,000 Units total) by mouth every 7 (seven) days.    Past medical history, social, surgical and family history all reviewed in electronic medical record.  No pertanent information unless stated regarding to the chief complaint.   Review of Systems:  No headache, visual changes, nausea, vomiting, diarrhea, constipation, dizziness, abdominal pain, skin rash, fevers, chills, night sweats, weight loss, swollen lymph nodes, body aches, , chest pain, shortness of breath, mood changes.  Muscle aches fatigue  Objective    General: No apparent distress alert and oriented x3 mood and affect normal, dressed appropriately.  Had difficulty with the visual platform and initial rest of the exam on telephone    Impression and Recommendations:     . The above documentation has been reviewed and is accurate and complete Lyndal Pulley, DO       Note: This dictation was prepared with Dragon dictation along with smaller phrase technology. Any transcriptional errors that result from this process are unintentional.

## 2019-03-27 ENCOUNTER — Ambulatory Visit (INDEPENDENT_AMBULATORY_CARE_PROVIDER_SITE_OTHER): Payer: Managed Care, Other (non HMO) | Admitting: Family Medicine

## 2019-03-27 ENCOUNTER — Encounter: Payer: Self-pay | Admitting: Family Medicine

## 2019-03-27 DIAGNOSIS — R413 Other amnesia: Secondary | ICD-10-CM | POA: Diagnosis not present

## 2019-03-27 MED ORDER — VITAMIN D (ERGOCALCIFEROL) 1.25 MG (50000 UNIT) PO CAPS: 50000.0000 [IU] | ORAL_CAPSULE | ORAL | 0 refills | Status: DC

## 2019-03-27 MED ORDER — HYDROXYZINE HCL 10 MG PO TABS: 10.0000 mg | ORAL_TABLET | Freq: Three times a day (TID) | ORAL | 1 refills | Status: DC | PRN

## 2019-03-27 NOTE — Assessment & Plan Note (Signed)
Patient's findings are similar to MRI being unremarkable Patient having significant stress with her mother having a brain tumor and father having Parkinson's and we know this is contributing to more of her discomfort.  We discussed that our labs seem to be completely unremarkable.  Does have some low vitamin D vitamins.  We discussed supplementation.  He needs to ask nephrologist about the vitamin D supplementation secondary to the history of kidney stones.  We discussed with the anxiety as well potentially some mild hydroxyzine that could be beneficial.  Patient is in agreement with this plan.  We will try it for 3 weeks and hope that this will.  Patient will follow-up with me again in 3 weeks

## 2019-04-05 ENCOUNTER — Other Ambulatory Visit: Payer: Self-pay | Admitting: Urology

## 2019-04-11 ENCOUNTER — Other Ambulatory Visit: Payer: Self-pay

## 2019-04-11 ENCOUNTER — Ambulatory Visit
Admission: RE | Admit: 2019-04-11 | Discharge: 2019-04-11 | Disposition: A | Discharge status: Home / Self Care | Payer: Managed Care, Other (non HMO) | Source: Ambulatory Visit | Attending: Family | Admitting: Family

## 2019-04-11 ENCOUNTER — Telehealth: Payer: Self-pay | Admitting: Family

## 2019-04-11 DIAGNOSIS — R748 Abnormal levels of other serum enzymes: Secondary | ICD-10-CM | POA: Diagnosis present

## 2019-04-11 NOTE — Telephone Encounter (Signed)
Tried to reach patient line is busy could not reach by phone.

## 2019-04-11 NOTE — Telephone Encounter (Signed)
[name] with [facility] called STAT Abdominal Ultrasound results. Gae Bon in the practice notified.

## 2019-04-11 NOTE — Telephone Encounter (Signed)
Would you try patient again today?  Let her know that I am not in office and can review full report tomorrow when there.   I want to ensure we triage symptoms as we discussed this morning for flank pain, fever, hematuria,N, V .  If symptomatic, she needs to go to ED.   We also need to ask her about urology and her current status with them in regard to renal stones.   Thanks so much Juliann Pulse !

## 2019-04-11 NOTE — Telephone Encounter (Signed)
Tried again to reach patient by phone no answer and voicemail is full.

## 2019-04-12 ENCOUNTER — Encounter: Payer: Self-pay | Admitting: Family

## 2019-04-12 ENCOUNTER — Other Ambulatory Visit: Payer: Self-pay | Admitting: Family

## 2019-04-12 DIAGNOSIS — K760 Fatty (change of) liver, not elsewhere classified: Secondary | ICD-10-CM

## 2019-04-12 DIAGNOSIS — R109 Unspecified abdominal pain: Secondary | ICD-10-CM

## 2019-04-12 NOTE — Telephone Encounter (Signed)
Sent mychart message  [name] is aware.  Let me when scheduled.

## 2019-04-12 NOTE — Telephone Encounter (Signed)
Call pt I sent her mychart message in regards to concern for renal stone.  We need to discuss CT scan ( see mychart message)  How is patient doing? Any N, v, flank pain, fever?  What is her follow up with Urology currently for renal stones?  Would she like [place] to make a f/u appt for her to be seen here or with urology?

## 2019-04-13 ENCOUNTER — Encounter: Payer: Self-pay | Admitting: *Deleted

## 2019-04-13 ENCOUNTER — Other Ambulatory Visit: Payer: Self-pay

## 2019-04-13 ENCOUNTER — Ambulatory Visit
Admission: RE | Admit: 2019-04-13 | Discharge: 2019-04-13 | Disposition: A | Discharge status: Home / Self Care | Payer: Managed Care, Other (non HMO) | Source: Ambulatory Visit | Attending: Family | Admitting: Family

## 2019-04-13 DIAGNOSIS — R42 Dizziness and giddiness: Secondary | ICD-10-CM | POA: Diagnosis not present

## 2019-04-13 DIAGNOSIS — Z0000 Encounter for general adult medical examination without abnormal findings: Secondary | ICD-10-CM | POA: Diagnosis present

## 2019-04-14 ENCOUNTER — Encounter: Payer: Self-pay | Admitting: Family

## 2019-04-14 ENCOUNTER — Telehealth: Payer: Self-pay | Admitting: Family

## 2019-04-14 ENCOUNTER — Ambulatory Visit
Admission: RE | Admit: 2019-04-14 | Discharge: 2019-04-14 | Disposition: A | Discharge status: Home / Self Care | Payer: Managed Care, Other (non HMO) | Source: Ambulatory Visit | Attending: Family | Admitting: Family

## 2019-04-14 DIAGNOSIS — Z0000 Encounter for general adult medical examination without abnormal findings: Secondary | ICD-10-CM | POA: Diagnosis not present

## 2019-04-14 NOTE — Telephone Encounter (Signed)
noted 

## 2019-04-14 NOTE — Telephone Encounter (Signed)
Call pt I sent her a mychart message Please review mychart message and discuss with patient

## 2019-04-14 NOTE — Telephone Encounter (Signed)
I spoke wit patient & she is responding via [employer]. She stated that she is having to bare down harder during urination now to empty bladder. She has  No increased pain, nausea, vomiting or fever. She does prefer to wait on insurance approval.

## 2019-04-14 NOTE — Progress Notes (Signed)
Spoke with pt in regards to scheduled procedure for 04/20/2019. Pt states will not be able to come on that date. Instructed pt to contact [facility] office in regards to conlict of time. Pt verbalized understanding.

## 2019-04-18 ENCOUNTER — Ambulatory Visit
Admission: RE | Admit: 2019-04-18 | Discharge: 2019-04-18 | Disposition: A | Discharge status: Home / Self Care | Payer: Managed Care, Other (non HMO) | Source: Ambulatory Visit | Attending: Family | Admitting: Family

## 2019-04-18 ENCOUNTER — Other Ambulatory Visit: Payer: Self-pay

## 2019-04-18 DIAGNOSIS — R109 Unspecified abdominal pain: Secondary | ICD-10-CM

## 2019-04-20 ENCOUNTER — Other Ambulatory Visit: Payer: Self-pay

## 2019-04-20 ENCOUNTER — Encounter: Payer: Self-pay | Admitting: Family

## 2019-04-20 ENCOUNTER — Ambulatory Visit (HOSPITAL_COMMUNITY): Admission: RE | Admit: 2019-04-20 | Payer: Managed Care, Other (non HMO) | Source: Home / Self Care | Admitting: Urology

## 2019-04-20 ENCOUNTER — Encounter (HOSPITAL_COMMUNITY): Admission: RE | Payer: Self-pay | Source: Home / Self Care

## 2019-04-20 ENCOUNTER — Other Ambulatory Visit: Payer: Self-pay | Admitting: Family Medicine

## 2019-04-20 SURGERY — LITHOTRIPSY, ESWL
Anesthesia: LOCAL | Laterality: Right

## 2019-04-21 ENCOUNTER — Other Ambulatory Visit: Payer: Self-pay

## 2019-04-21 ENCOUNTER — Other Ambulatory Visit: Payer: Self-pay | Admitting: Urology

## 2019-04-21 ENCOUNTER — Other Ambulatory Visit (HOSPITAL_COMMUNITY)
Admission: RE | Admit: 2019-04-21 | Discharge: 2019-04-21 | Disposition: A | Discharge status: Home / Self Care | Payer: Managed Care, Other (non HMO) | Source: Ambulatory Visit | Attending: Urology | Admitting: Urology

## 2019-04-21 ENCOUNTER — Encounter: Payer: Self-pay | Admitting: Family

## 2019-04-21 DIAGNOSIS — Z01812 Encounter for preprocedural laboratory examination: Secondary | ICD-10-CM | POA: Diagnosis not present

## 2019-04-21 DIAGNOSIS — Z1159 Encounter for screening for other viral diseases: Secondary | ICD-10-CM | POA: Insufficient documentation

## 2019-04-21 NOTE — Progress Notes (Signed)

## 2019-04-21 NOTE — Progress Notes (Signed)
Spoke w/ pt via phone for pre-op interview.  Npo after mn.  Arrive at 0900.  Pt had rapid test done today.

## 2019-04-22 LAB — NOVEL CORONAVIRUS, NAA (HOSP ORDER, SEND-OUT TO REF LAB; TAT 18-24 HRS): SARS-CoV-2, NAA: NOT DETECTED

## 2019-04-23 NOTE — H&P (Signed)
Office Visit Report 04/19/2019  [name]        MRN: [id number]  PRIMARY CARE:   DOB: December 31, 1965, 53 year old Female  REFERRING:  Scott A. MacDiarmid, MD SSN:   PROVIDER:  Festus Aloe, M.D.   TREATING:  Raynelle Bring, M.D.   LOCATION:  [facility] [phone]  CC/HPI: Right ureteral and renal calculi   [name] is a 53 year old patient of Dr. Junious Silk. She was initially seen in March this year and does have a longstanding history of recurrent urolithiasis previously treated in [place]. She was adamant to avoid ureteroscopic treatment due to her intolerance of ureteral stents. She therefore underwent lithotripsy for large burden right renal calculi. She was recently evaluated and was noted to have persistent stone burden in the proximal right ureter and was going to schedule a repeat lithotripsy procedure again trying to avoid a ureteral stent. She also has been helping her mom who has been in hospice. Her mother did pass away last week. She underwent imaging of her liver that demonstrated right-sided hydronephrosis prompting a full CT scan that confirmed residual right renal stone burden along with fairly large burden right proximal ureteral stones and distal ureteral stones on the right. Her stone burden in the proximal ureter measuring approximately 1.9 cm with a stone burden in the distal ureter measuring approximately 1.8 cm. She has had pain that has been relatively well controlled. She denies any fever. She has had mild nausea. 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    ALLERGIES: Contrast Dyes latex Levemir NSAIDs   MEDICATIONS: Bariatric Vitamin  Ibuprofen  Tylenol    GU PSH: ESWL, Right - 03/20/2019, Left - 01/30/2019   NON-GU PSH: Cholecystectomy (laparoscopic)      GU PMH: Renal calculus - 04/04/2019, - 03/29/2019, - 03/02/2019, - 02/13/2019, - 01/27/2019 Ureteral calculus (Improving), Right - 04/04/2019, - 03/29/2019, -  03/02/2019, - 02/13/2019, - 01/27/2019 Flank Pain - 03/29/2019   NON-GU PMH: Diabetes Type 2 Hypertension Sleep Apnea   FAMILY HISTORY: Kidney Stones - Runs in Family  SOCIAL HISTORY: Marital Status: Single Preferred Language: English; Race: White Current Smoking Status: Patient has never smoked.  <DIV'  Tobacco Use Assessment Completed:  Used Tobacco in last 30 days?      Notes: 2 daughters   REVIEW OF SYSTEMS:    GU Review Female:  Patient reports frequent urination, burning /pain with urination, and have to strain to urinate. Patient denies hard to postpone urination, get up at night to urinate, leakage of urine, stream starts and stops, trouble starting your stream, and currently pregnant.   Gastrointestinal (Upper):  Patient reports nausea. Patient denies vomiting.   Gastrointestinal (Lower):  Patient denies diarrhea and constipation.   Constitutional:  Patient denies fever, night sweats, weight loss, and fatigue.   Skin:  Patient denies skin rash/ lesion and itching.   Eyes:  Patient denies blurred vision and double vision.   Ears/ Nose/ Throat:  Patient denies sore throat and sinus problems.   Hematologic/Lymphatic:  Patient denies swollen glands and easy bruising.   Cardiovascular:  Patient denies chest pains and leg swelling.   Respiratory:  Patient denies cough and shortness of breath.   Endocrine:  Patient denies excessive thirst.   Musculoskeletal:  Patient reports back pain. Patient denies joint pain.   Neurological:  Patient denies headaches and dizziness.   Psychologic:  Patient denies depression and anxiety.   Notes: pt c/o right lower  abdominal pain    VITAL SIGNS:      04/19/2019 02:33 PM    Weight 176 lb / 79.83 kg    Height 68 in / 172.72 cm    BP 162/91 mmHg    Pulse 58 /min    Temperature 98.2 F / 36.7 C    BMI 26.8 kg/m    MULTI-SYSTEM PHYSICAL EXAMINATION:     Constitutional: Well-nourished. No physical deformities. Normally developed. Good  grooming.    Respiratory: No labored breathing, no use of accessory muscles.     Cardiovascular: Normal temperature, normal extremity pulses, no swelling, no varicosities.    Gastrointestinal: No CVAT.          PAST DATA REVIEWED:  Source Of History:  Patient X-Ray Review: C.T. Abdomen/Pelvis: Reviewed Films.    Notes:  Final Report  CLINICAL DATA: Right flank pain, renal stones   EXAM:  CT ABDOMEN AND PELVIS WITHOUT CONTRAST   TECHNIQUE:  Multidetector CT imaging of the abdomen and pelvis was performed  following the standard protocol without IV contrast.   COMPARISON: 01/26/2019   FINDINGS:  Lower chest: No acute abnormality.   Hepatobiliary: No focal liver abnormality is seen. Status post  cholecystectomy. No biliary dilatation.   Pancreas: Unremarkable. No pancreatic ductal dilatation or  surrounding inflammatory changes.   Spleen: Normal in size without significant abnormality.   Adrenals/Urinary Tract: Adrenal glands are unremarkable. There is  severe right hydronephrosis with two elongated calculi or  conglomerates in proximal right ureter measuring 1.9 cm in length  and in the distal third of the right ureter measuring 1.8 cm in  length (series 5, image 78, 82). There are multiple additional  bilateral nonobstructive renal calculi. A previously noted  left-sided ureteral calculus is no longer present. Bladder is  unremarkable.   Stomach/Bowel: Status post Roux-en-Y gastric bypass. Appendix  appears normal. No evidence of bowel wall thickening, distention, or  inflammatory changes.   Vascular/Lymphatic: No significant vascular findings are present. No  enlarged abdominal or pelvic lymph nodes.   Reproductive: No mass or other significant abnormality.   Other: No abdominal wall hernia or abnormality. No abdominopelvic  ascites.   Musculoskeletal: No acute or significant osseous findings.   IMPRESSION:  1. There is severe right hydronephrosis with two  elongated calculi  or conglomerates in proximal right ureter measuring 1.9 cm in length  and in the distal third of the right ureter measuring 1.8 cm in  length (series 5, image 78, 82). There are multiple additional  bilateral nonobstructive renal calculi. A previously noted  left-sided ureteral calculus is no longer present.   2. Other chronic, incidental, and postoperative findings as detailed  above.   These results will be called to the ordering clinician or  representative by the Radiologist Assistant, and communication  documented in the PACS or zVision Dashboard.    Electronically Signed  By: Eddie Candle M.D.  On: 04/18/2019 12:45    PROCEDURES:   Urinalysis w/Scope  Dipstick Dipstick Cont'd Micro Color: Yellow Bilirubin: Neg mg/dL WBC/hpf: 0 - 5/hpf Appearance: Cloudy Ketones: Neg mg/dL RBC/hpf: >60/hpf Specific Gravity: 1.025 Blood: 3+ ery/uL Bacteria: Few (10-25/hpf) pH: 5.5 Protein: Trace mg/dL Cystals: NS (Not Seen) Glucose: Neg mg/dL Urobilinogen: 0.2 mg/dL Casts: NS (Not Seen)  Nitrites: Neg Trichomonas: Not Present  Leukocyte Esterase: Neg leu/uL Mucous: Not Present   Epithelial Cells: NS (Not Seen)   Yeast: NS (Not Seen)   Sperm: Not Present   ASSESSMENT:    ICD-10 Details  1 GU:  Ureteral calculus - N20.1 Right 2  Renal calculus - N20.0   PLAN:  Medications  Stop Meds: Percocet 5 mg-325 mg tablet 1 tablet PO Q 6 H PRN Start: 03/29/2019  Discontinue: 04/19/2019 - Reason: The medication cycle was completed.   Orders  Labs Urine Culture Schedule  Return Visit/Planned Activity: Other See Visit Notes Note: Will call to schedule surgery. Document  Letter(s):  Created for Patient: Clinical Summary  Notes:  1. Large burden right renal and right ureteral calculi: We discussed her CT scan results. She understands that she may pass some of the stone fragments but would be very unlikely for her to pass all of the stone fragments. As such, we discussed  options for proceeding with definitive therapy. Due to her desire to avoid a ureteral stent, we discussed non conventional approaches with stage lithotripsy of her distal stone followed by her proximal stones. We also discussed nephrostomy tube placement with subsequent ureteroscopy with the goal to avoid a postoperative ureteral stent. Ultimately, she expresses are understanding of the situation and is agreeable to proceed with standard right ureteroscopic laser lithotripsy. She understands that she will require a postoperative stent for approximately 3-5 days and is accepting of this part of the procedure. We reviewed the potential risks, complications, and the expected recovery process associated with this procedure today. Her urine has been cultured. We did discuss the fact that she has bacteriuria. She will notify us should she develop fever and otherwise will be treated pending her culture results. I will notify Dr. Junious Silk that I saw her today so that he can proceed with scheduling her. All questions were answered to her stated satisfaction.   Cc: Dr. Eda Keys  * Signed by Raynelle Bring, M.D. on 04/19/19 at 3:49 PM (EDT)*   The information contained in this medical record document is considered private and confidential patient information. This information can only be used for the medical diagnosis and/or medical services that are being provided by the patient's selected caregivers. This information can only be distributed outside of the patient's care if the patient agrees and signs waivers of authorization for this information to be sent to an outside source or route.  Add; I communicated with Dr. Alinda Money about the patient. Urine cx from the office was negative.

## 2019-04-24 ENCOUNTER — Encounter (HOSPITAL_BASED_OUTPATIENT_CLINIC_OR_DEPARTMENT_OTHER): Payer: Self-pay | Admitting: Certified Registered"

## 2019-04-24 ENCOUNTER — Encounter (HOSPITAL_BASED_OUTPATIENT_CLINIC_OR_DEPARTMENT_OTHER)
Admission: RE | Disposition: A | Discharge status: Home / Self Care | Payer: Self-pay | Source: Home / Self Care | Attending: Urology

## 2019-04-24 ENCOUNTER — Ambulatory Visit (HOSPITAL_BASED_OUTPATIENT_CLINIC_OR_DEPARTMENT_OTHER)
Admission: RE | Admit: 2019-04-24 | Discharge: 2019-04-24 | Disposition: A | Discharge status: Home / Self Care | Payer: Managed Care, Other (non HMO) | Attending: Urology | Admitting: Urology

## 2019-04-24 ENCOUNTER — Ambulatory Visit (HOSPITAL_BASED_OUTPATIENT_CLINIC_OR_DEPARTMENT_OTHER): Payer: Managed Care, Other (non HMO) | Admitting: Certified Registered"

## 2019-04-24 ENCOUNTER — Other Ambulatory Visit: Payer: Self-pay

## 2019-04-24 DIAGNOSIS — I10 Essential (primary) hypertension: Secondary | ICD-10-CM | POA: Diagnosis not present

## 2019-04-24 DIAGNOSIS — Z87442 Personal history of urinary calculi: Secondary | ICD-10-CM | POA: Insufficient documentation

## 2019-04-24 DIAGNOSIS — G4730 Sleep apnea, unspecified: Secondary | ICD-10-CM | POA: Diagnosis not present

## 2019-04-24 DIAGNOSIS — N201 Calculus of ureter: Secondary | ICD-10-CM

## 2019-04-24 DIAGNOSIS — N132 Hydronephrosis with renal and ureteral calculous obstruction: Secondary | ICD-10-CM | POA: Insufficient documentation

## 2019-04-24 DIAGNOSIS — E119 Type 2 diabetes mellitus without complications: Secondary | ICD-10-CM | POA: Diagnosis not present

## 2019-04-24 HISTORY — DX: Fatty (change of) liver, not elsewhere classified: K76.0

## 2019-04-24 HISTORY — DX: Calculus of ureter: N20.1

## 2019-04-24 HISTORY — DX: Calculus of kidney: N20.0

## 2019-04-24 HISTORY — DX: Age-related osteoporosis without current pathological fracture: M81.0

## 2019-04-24 HISTORY — DX: Personal history of other malignant neoplasm of skin: Z85.828

## 2019-04-24 HISTORY — DX: Personal history of other diseases of the circulatory system: Z86.79

## 2019-04-24 HISTORY — DX: Personal history of other diseases of the nervous system and sense organs: Z86.69

## 2019-04-24 HISTORY — DX: Unspecified hydronephrosis: N13.30

## 2019-04-24 HISTORY — DX: Personal history of other endocrine, nutritional and metabolic disease: Z86.39

## 2019-04-24 HISTORY — DX: Unspecified asthma, uncomplicated: J45.909

## 2019-04-24 HISTORY — PX: CYSTOSCOPY/URETEROSCOPY/HOLMIUM LASER/STENT PLACEMENT: SHX6546

## 2019-04-24 SURGERY — CYSTOSCOPY/URETEROSCOPY/HOLMIUM LASER/STENT PLACEMENT
Anesthesia: General | Site: Ureter | Laterality: Right

## 2019-04-24 MED ORDER — OXYCODONE HCL 5 MG/5ML PO SOLN
5.0000 mg | Freq: Once | ORAL | Status: DC | PRN
  Filled 2019-04-24: qty 5

## 2019-04-24 MED ORDER — FENTANYL CITRATE (PF) 100 MCG/2ML IJ SOLN
25.0000 ug | INTRAMUSCULAR | Status: DC | PRN
  Filled 2019-04-24: qty 1

## 2019-04-24 MED ORDER — CEFAZOLIN SODIUM-DEXTROSE 2-4 GM/100ML-% IV SOLN
INTRAVENOUS | Status: AC
  Filled 2019-04-24: qty 100

## 2019-04-24 MED ORDER — FENTANYL CITRATE (PF) 100 MCG/2ML IJ SOLN
INTRAMUSCULAR | Status: DC | PRN
  Administered 2019-04-24: 50 ug via INTRAVENOUS

## 2019-04-24 MED ORDER — PROPOFOL 10 MG/ML IV BOLUS
INTRAVENOUS | Status: DC | PRN
  Administered 2019-04-24: 200 mg via INTRAVENOUS

## 2019-04-24 MED ORDER — DEXAMETHASONE SODIUM PHOSPHATE 10 MG/ML IJ SOLN
INTRAMUSCULAR | Status: AC
  Filled 2019-04-24: qty 1

## 2019-04-24 MED ORDER — FENTANYL CITRATE (PF) 100 MCG/2ML IJ SOLN
INTRAMUSCULAR | Status: AC
  Filled 2019-04-24: qty 2

## 2019-04-24 MED ORDER — SODIUM CHLORIDE 0.9 % IR SOLN
Status: DC | PRN
  Administered 2019-04-24: 3000 mL

## 2019-04-24 MED ORDER — KETOROLAC TROMETHAMINE 30 MG/ML IJ SOLN
INTRAMUSCULAR | Status: DC | PRN
  Administered 2019-04-24: 30 mg via INTRAVENOUS

## 2019-04-24 MED ORDER — CEFAZOLIN SODIUM-DEXTROSE 2-4 GM/100ML-% IV SOLN
2.0000 g | Freq: Once | INTRAVENOUS | Status: AC
  Administered 2019-04-24: 10:00:00 2 g via INTRAVENOUS
  Filled 2019-04-24: qty 100

## 2019-04-24 MED ORDER — OXYCODONE HCL 5 MG PO TABS
5.0000 mg | ORAL_TABLET | Freq: Once | ORAL | Status: DC | PRN
  Filled 2019-04-24: qty 1

## 2019-04-24 MED ORDER — MIDAZOLAM HCL 2 MG/2ML IJ SOLN
INTRAMUSCULAR | Status: AC
  Filled 2019-04-24: qty 2

## 2019-04-24 MED ORDER — LACTATED RINGERS IV SOLN
INTRAVENOUS | Status: DC
  Administered 2019-04-24: 50 mL/h via INTRAVENOUS
  Filled 2019-04-24: qty 1000

## 2019-04-24 MED ORDER — ONDANSETRON HCL 4 MG/2ML IJ SOLN
INTRAMUSCULAR | Status: AC
  Filled 2019-04-24: qty 2

## 2019-04-24 MED ORDER — KETOROLAC TROMETHAMINE 30 MG/ML IJ SOLN
INTRAMUSCULAR | Status: AC
  Filled 2019-04-24: qty 1

## 2019-04-24 MED ORDER — LIDOCAINE 2% (20 MG/ML) 5 ML SYRINGE
INTRAMUSCULAR | Status: AC
  Filled 2019-04-24: qty 5

## 2019-04-24 MED ORDER — LIDOCAINE 2% (20 MG/ML) 5 ML SYRINGE
INTRAMUSCULAR | Status: DC | PRN
  Administered 2019-04-24: 60 mg via INTRAVENOUS

## 2019-04-24 MED ORDER — IOHEXOL 300 MG/ML  SOLN
INTRAMUSCULAR | Status: DC | PRN
  Administered 2019-04-24: 10 mL via URETHRAL

## 2019-04-24 MED ORDER — CIPROFLOXACIN HCL 500 MG PO TABS: 500.0000 mg | ORAL_TABLET | Freq: Every day | ORAL | 0 refills | Status: DC

## 2019-04-24 MED ORDER — DEXAMETHASONE SODIUM PHOSPHATE 10 MG/ML IJ SOLN
INTRAMUSCULAR | Status: DC | PRN
  Administered 2019-04-24: 5 mg via INTRAVENOUS

## 2019-04-24 MED ORDER — ONDANSETRON HCL 4 MG/2ML IJ SOLN
4.0000 mg | Freq: Four times a day (QID) | INTRAMUSCULAR | Status: DC | PRN
  Filled 2019-04-24: qty 2

## 2019-04-24 MED ORDER — MIDAZOLAM HCL 2 MG/2ML IJ SOLN
INTRAMUSCULAR | Status: DC | PRN
  Administered 2019-04-24: 2 mg via INTRAVENOUS

## 2019-04-24 MED ORDER — ONDANSETRON HCL 4 MG/2ML IJ SOLN
INTRAMUSCULAR | Status: DC | PRN
  Administered 2019-04-24: 4 mg via INTRAVENOUS

## 2019-04-24 SURGICAL SUPPLY — 22 items
BAG DRAIN URO-CYSTO SKYTR STRL (DRAIN) ×2 IMPLANT
CATH URET 5FR 28IN CONE TIP (BALLOONS)
CATH URET 5FR 28IN OPEN ENDED (CATHETERS) ×2 IMPLANT
CATH URET 5FR 70CM CONE TIP (BALLOONS) IMPLANT
CATH URET DUAL LUMEN 6-10FR 50 (CATHETERS) IMPLANT
CLOTH BEACON ORANGE TIMEOUT ST (SAFETY) ×2 IMPLANT
GLOVE BIO SURGEON STRL SZ7.5 (GLOVE) IMPLANT
GLOVE BIOGEL PI IND STRL 6.5 (GLOVE) ×2 IMPLANT
GLOVE BIOGEL PI IND STRL 7.5 (GLOVE) ×1 IMPLANT
GLOVE BIOGEL PI INDICATOR 6.5 (GLOVE) ×2
GLOVE BIOGEL PI INDICATOR 7.5 (GLOVE) ×1
GOWN STRL REUS W/TWL LRG LVL3 (GOWN DISPOSABLE) ×4 IMPLANT
GUIDEWIRE ANG ZIPWIRE 038X150 (WIRE) ×2 IMPLANT
GUIDEWIRE STR DUAL SENSOR (WIRE) ×2 IMPLANT
IV NS IRRIG 3000ML ARTHROMATIC (IV SOLUTION) ×2 IMPLANT
KIT TURNOVER CYSTO (KITS) ×2 IMPLANT
MANIFOLD NEPTUNE II (INSTRUMENTS) ×2 IMPLANT
NS IRRIG 500ML POUR BTL (IV SOLUTION) ×2 IMPLANT
PACK CYSTO (CUSTOM PROCEDURE TRAY) ×2 IMPLANT
STENT URET 6FRX24 CONTOUR (STENTS) ×2 IMPLANT
TUBE CONNECTING 12X1/4 (SUCTIONS) ×2 IMPLANT
TUBING UROLOGY SET (TUBING) ×2 IMPLANT

## 2019-04-24 NOTE — Op Note (Addendum)
Preoperative diagnosis: Right ureteral stones Postoperative diagnosis: Right ureteral edema  Procedure: Cystoscopy, right retrograde pyelogram, right diagnostic ureteroscopy, stent placement  Surgeon: Junious Silk  Anesthesia: General  Indication for procedure: [name] is a 53 year old female who underwent shockwave lithotripsy of a large right renal pelvic stone.  Shockwave was over 4 weeks ago.  She had persistent fragments in her ureter and we considered repeat shockwave but last week she developed flank pain and CT scan was done which showed a proximal ureteral stone and a mid ureteral stone.  She was brought today for ureteroscopy but did note passing to separate episodes of "grit".  She said she did not see any large pieces.  She has had no flank pain, dysuria or fever.  Findings: On cystoscopy the bladder and urethra were unremarkable.  No stone or foreign body in the bladder.  Right retrograde pyelogram-this outlined filling defect in the mid ureter consistent with the iliac crossover and possibly a filling defect in the mid pole infundibulum.  On ureteroscopy the ureter was clear and the collecting system was clear. There was some edema and mild erythema in the proximal ureter from the prior stone impaction.  Findings from the collecting system were incomplete filling with contrast.  No stones or tumors were noted.  Description of procedure: After consent was obtained patient brought to the operating room.  After adequate anesthesia she is placed in lithotomy position and prepped and draped in usual sterile fashion.  Timeout was performed to confirm the patient and procedure.  The cystoscope was passed per urethra and the bladder inspected.  A 5 French open-ended catheter was used to cannulate the right ureteral orifice and retrograde injection of contrast was performed.  I then passed a sensor wire and adjacent to it the 4.5 French semirigid ureteroscope up to the renal pelvis.  I then  backed the semirigid out after passing a Glidewire under direct vision.  Adjacent to the Glidewire over the sensor I passed the single channel digital ureteroscope into the proximal ureter.  This was then guided into the collecting system and it was inspected and noted to be normal without tumor or stone fragment.  The collecting system renal pelvis and ureter were inspected again on the way out and noted to be normal without stone fragment or injury.  The wire was backloaded on the cystoscope and a 624 stent advanced.  A good coil was seen in the renal pelvis and a good coil in the bladder after the wire was removed.  I left a string on the stent.  The bladder was drained and the scope removed.  She was awakened and taken to the cover room in stable condition.  Complications: None  Blood loss: Minimal  Specimens: None  Drains: 6 x 24 cm right ureteral stent with string  Disposition: Patient stable to PACU

## 2019-04-24 NOTE — Anesthesia Preprocedure Evaluation (Signed)
Anesthesia Evaluation  Patient identified by MRN, date of birth, ID band Patient awake    Reviewed: Allergy & Precautions, H&P , NPO status , Patient's Chart, lab work & pertinent test results  Airway Mallampati: II   Neck ROM: full    Dental   Pulmonary asthma , sleep apnea ,    breath sounds clear to auscultation       Cardiovascular hypertension,  Rhythm:regular Rate:Normal     Neuro/Psych PSYCHIATRIC DISORDERS Depression    GI/Hepatic   Endo/Other  diabetes  Renal/GU Renal diseasestones     Musculoskeletal   Abdominal   Peds  Hematology   Anesthesia Other Findings   Reproductive/Obstetrics                             Anesthesia Physical Anesthesia Plan  ASA: III  Anesthesia Plan: General   Post-op Pain Management:    Induction: Intravenous  PONV Risk Score and Plan: 3 and Ondansetron, Dexamethasone, Midazolam and Treatment may vary due to age or medical condition  Airway Management Planned: LMA  Additional Equipment:   Intra-op Plan:   Post-operative Plan:   Informed Consent: I have reviewed the patients History and Physical, chart, labs and discussed the procedure including the risks, benefits and alternatives for the proposed anesthesia with the patient or authorized representative who has indicated his/her understanding and acceptance.       Plan Discussed with: CRNA, Anesthesiologist and Surgeon  Anesthesia Plan Comments:         Anesthesia Quick Evaluation

## 2019-04-24 NOTE — Anesthesia Procedure Notes (Signed)
Procedure Name: LMA Insertion Date/Time: 04/24/2019 10:26 AM Performed by: Suan Halter, CRNA Pre-anesthesia Checklist: Patient identified, Emergency Drugs available, Suction available and Patient being monitored Patient Re-evaluated:Patient Re-evaluated prior to induction Oxygen Delivery Method: Circle system utilized Preoxygenation: Pre-oxygenation with 100% oxygen Induction Type: IV induction Ventilation: Mask ventilation without difficulty LMA: LMA inserted LMA Size: 4.0 Number of attempts: 1 Airway Equipment and Method: Bite block Placement Confirmation: positive ETCO2 Tube secured with: Tape Dental Injury: Teeth and Oropharynx as per pre-operative assessment

## 2019-04-24 NOTE — Progress Notes (Signed)
SPOKE W/  _     SCREENING SYMPTOMS OF COVID 19:   COUGH--no  RUNNY NOSE--- no  SORE THROAT---no  NASAL CONGESTION----no  SNEEZING----no  SHORTNESS OF BREATH---no  DIFFICULTY BREATHING---no  TEMP >100.0 -----no  UNEXPLAINED BODY ACHES------no  CHILLS -------- no HEADACHES --------- no LOSS OF SMELL/ TASTE ------no--    HAVE YOU OR ANY FAMILY MEMBER TRAVELLED PAST 14 DAYS OUT OF THE   COUNTY---no STATE----no COUNTRY----no  HAVE YOU OR ANY FAMILY MEMBER BEEN EXPOSED TO ANYONE WITH COVID 19?

## 2019-04-24 NOTE — Discharge Instructions (Signed)
Ureteral Stent Implantation, Care After Refer to this sheet in the next few weeks. These instructions provide you with information about caring for yourself after your procedure. Your health care provider may also give you more specific instructions. Your treatment has been planned according to current medical practices, but problems sometimes occur. Call your health care provider if you have any problems or questions after your procedure.  Removal of the stent -- remove the stent Wednesday morning, 04/26/2019 but pulling the string slowly   What can I expect after the procedure? After the procedure, it is common to have:  Nausea.  Mild pain when you urinate. You may feel this pain in your lower back or lower abdomen. Pain should stop within a few minutes after you urinate. This may last for up to 1 week.  A small amount of blood in your urine for several days. Follow these instructions at home:  Medicines  Take over-the-counter and prescription medicines only as told by your health care provider.  If you were prescribed an antibiotic medicine, take it as told by your health care provider. Do not stop taking the antibiotic even if you start to feel better.  Do not drive for 24 hours if you received a sedative.  Do not drive or operate heavy machinery while taking prescription pain medicines. Activity  Return to your normal activities as told by your health care provider. Ask your health care provider what activities are safe for you.  Do not lift anything that is heavier than 10 lb (4.5 kg). Follow this limit for 1 week after your procedure, or for as long as told by your health care provider. General instructions  Watch for any blood in your urine. Call your health care provider if the amount of blood in your urine increases.  If you have a catheter: ? Follow instructions from your health care provider about taking care of your catheter and collection bag. ? Do not take baths,  swim, or use a hot tub until your health care provider approves.  Drink enough fluid to keep your urine clear or pale yellow.  Keep all follow-up visits as told by your health care provider. This is important. Contact a health care provider if:  You have pain that gets worse or does not get better with medicine, especially pain when you urinate.  You have difficulty urinating.  You feel nauseous or you vomit repeatedly during a period of more than 2 days after the procedure. Get help right away if:  Your urine is dark red or has blood clots in it.  You are leaking urine (have incontinence).  The end of the stent comes out of your urethra.  You cannot urinate.  You have sudden, sharp, or severe pain in your abdomen or lower back.  You have a fever. This information is not intended to replace advice given to you by your health care provider. Make sure you discuss any questions you have with your health care provider. Document Released: 06/28/2013 Document Revised: 04/02/2016 Document Reviewed: 05/10/2015 Elsevier Interactive Patient Education  2019 [place], ALEVE, MOTRIN, IBUPROFEN UNTIL 430PM  Post Anesthesia Home Care Instructions  Activity: Get plenty of rest for the remainder of the day. A responsible adult should stay with you for 24 hours following the procedure.  For the next 24 hours, DO NOT: -Drive a car -Paediatric nurse -Drink alcoholic beverages -Take any medication unless instructed by your physician -Make any legal decisions or sign important  papers.  Meals: Start with liquid foods such as gelatin or soup. Progress to regular foods as tolerated. Avoid greasy, spicy, heavy foods. If nausea and/or vomiting occur, drink only clear liquids until the nausea and/or vomiting subsides. Call your physician if vomiting continues.  Special Instructions/Symptoms: Your throat may feel dry or sore from the anesthesia or the breathing tube placed in your  throat during surgery. If this causes discomfort, gargle with warm salt water. The discomfort should disappear within 24 hours.  If you had a scopolamine patch placed behind your ear for the management of post- operative nausea and/or vomiting:  1. The medication in the patch is effective for 72 hours, after which it should be removed.  Wrap patch in a tissue and discard in the trash. Wash hands thoroughly with soap and water. 2. You may remove the patch earlier than 72 hours if you experience unpleasant side effects which may include dry mouth, dizziness or visual disturbances. 3. Avoid touching the patch. Wash your hands with soap and water after contact with the patch.

## 2019-04-24 NOTE — Interval H&P Note (Signed)
History and Physical Interval Note:  04/24/2019 10:16 AM  [name]  has presented today for surgery, with the diagnosis of RIGHT URETERAL STONES, HYDRONEPHROSIS.  The various methods of treatment have been discussed with the patient and family. After consideration of risks, benefits and other options for treatment, the patient has consented to  Procedure(s): CYSTOSCOPY/URETEROSCOPY/HOLMIUM LASER/STENT PLACEMENT (Right) as a surgical intervention.  The patient's history has been reviewed, patient examined, no change in status, stable for surgery. She is doing well. Passed "grit" on two occasions. No flank pain. No dysuria or fever.  I have reviewed the patient's chart and labs.  Questions were answered to the patient's satisfaction.  Discussed stent and possibility of staged procedure.    Festus Aloe

## 2019-04-24 NOTE — Transfer of Care (Signed)
Immediate Anesthesia Transfer of Care Note  Patient: [name]  Procedure(s) Performed: Procedure(s) (LRB): CYSTOSCOPY/URETEROSCOPY/STENT PLACEMENT/ RIGHT RETROGRADE (Right)  Patient Location: PACU  Anesthesia Type: General  Level of Consciousness: awake, oriented, sedated and patient cooperative  Airway & Oxygen Therapy: Patient Spontanous Breathing and Patient connected to face mask oxygen  Post-op Assessment: Report given to PACU RN and Post -op Vital signs reviewed and stable  Post vital signs: Reviewed and stable  Complications: No apparent anesthesia complications  Last Vitals:  Vitals Value Taken Time  BP 157/90 04/24/19 1102  Temp    Pulse 58 04/24/19 1104  Resp 16 04/24/19 1104  SpO2 100 % 04/24/19 1104  Vitals shown include unvalidated device data.  Last Pain:  Vitals:   04/24/19 0934  TempSrc: Oral  PainSc: 4       Patients Stated Pain Goal: 8 (04/24/19 0934)

## 2019-04-25 ENCOUNTER — Encounter (HOSPITAL_BASED_OUTPATIENT_CLINIC_OR_DEPARTMENT_OTHER): Payer: Self-pay | Admitting: Urology

## 2019-04-25 NOTE — Anesthesia Postprocedure Evaluation (Signed)
Anesthesia Post Note  Patient: [name]  Procedure(s) Performed: CYSTOSCOPY/URETEROSCOPY/STENT PLACEMENT/ RIGHT RETROGRADE (Right Ureter)     Patient location during evaluation: PACU Anesthesia Type: General Level of consciousness: awake and alert Pain management: pain level controlled Vital Signs Assessment: post-procedure vital signs reviewed and stable Respiratory status: spontaneous breathing, nonlabored ventilation, respiratory function stable and patient connected to nasal cannula oxygen Cardiovascular status: blood pressure returned to baseline and stable Postop Assessment: no apparent nausea or vomiting Anesthetic complications: no    Last Vitals:  Vitals:   04/24/19 1130 04/24/19 1145  BP: (!) 145/82 (!) 145/88  Pulse: (!) 47 (!) 50  Resp: 16 18  Temp:    SpO2: 100% 97%    Last Pain:  Vitals:   04/24/19 1145  TempSrc:   PainSc: [place]

## 2019-05-03 ENCOUNTER — Ambulatory Visit (INDEPENDENT_AMBULATORY_CARE_PROVIDER_SITE_OTHER): Payer: Managed Care, Other (non HMO) | Admitting: Family

## 2019-05-03 ENCOUNTER — Other Ambulatory Visit: Payer: Self-pay

## 2019-05-03 ENCOUNTER — Encounter: Payer: Self-pay | Admitting: Family

## 2019-05-03 VITALS — BP 130/80 | HR 71 | Temp 98.4°F | Wt 176.4 lb

## 2019-05-03 DIAGNOSIS — R413 Other amnesia: Secondary | ICD-10-CM

## 2019-05-03 DIAGNOSIS — N200 Calculus of kidney: Secondary | ICD-10-CM | POA: Diagnosis not present

## 2019-05-03 DIAGNOSIS — E049 Nontoxic goiter, unspecified: Secondary | ICD-10-CM | POA: Diagnosis not present

## 2019-05-03 DIAGNOSIS — M810 Age-related osteoporosis without current pathological fracture: Secondary | ICD-10-CM

## 2019-05-03 DIAGNOSIS — R1310 Dysphagia, unspecified: Secondary | ICD-10-CM

## 2019-05-03 NOTE — Progress Notes (Deleted)
This visit type was conducted due to national recommendations for restrictions regarding the COVID-19 pandemic (e.g. social distancing).  This format is felt to be most appropriate for this patient at this time.  All issues noted in this document were discussed and addressed.  No physical exam was performed (except for noted visual exam findings with Video Visits). Virtual Visit via Video Note  I connected with@  on 05/03/19 at  1:30 PM EDT by a video enabled telemedicine application and verified that I am speaking with the correct person using two identifiers.  Location patient: home Location provider:work or home office Persons participating in the virtual visit: patient, provider  I discussed the limitations of evaluation and management by telemedicine and the availability of in person appointments. The patient expressed understanding and agreed to proceed.   HPI:  Kidney stones Recent CT ab showed severe hydronephrosis   ROS: See pertinent positives and negatives per HPI.  Past Medical History:  Diagnosis Date  . Asthma due to seasonal allergies    no inhaler  . History of diabetes mellitus, type II    per pt resolved after bariatric surgery 2017  . History of hypertension    per pt resolved after bariatric surgery 2017  . History of kidney stones   . History of obstructive sleep apnea    per pt hx osa w/ cpap,  retested after bariatric surgery 2017 result negative  . History of skin cancer    nose, not melanoma  . Hydronephrosis, right   . Hyperlipidemia   . MRSA carrier   . NAFL (nonalcoholic fatty liver)   . Osteoporosis   . Renal calculi    per CT 04-18-2019 bilateral , non-obstructive  . S/P bariatric surgery 08/03/2016   duodenal switch w/ hh repair  . Ureteral calculus, right   . Wears glasses     Past Surgical History:  Procedure Laterality Date  . BARIATRIC SURGERY  08-03-2016    dr Darnell Level @[facility] ,  [place]   LAPAROSCOPY DUODENAL SWITCH AND HIATAL HERNIA  REPAIR  . BREAST BIOPSY Right 2017   FIBROCYSTIC CHANGES WITH CALCIFICATIONS  . CESAREAN SECTION  x2  last one 09-10-2000  . CYSTOSCOPY WITH RETROGRADE PYELOGRAM, URETEROSCOPY AND STENT PLACEMENT Right 02-07-2012  @[facility]   . CYSTOSCOPY WITH STENT PLACEMENT Right 09/10/2017   Procedure: CYSTOSCOPY WITH STENT PLACEMENT;  Surgeon: Abbie Sons, MD;  Location: [facility];  Service: Urology;  Laterality: Right;  . CYSTOSCOPY/URETEROSCOPY/HOLMIUM LASER/STENT PLACEMENT Right 04/24/2019   Procedure: CYSTOSCOPY/URETEROSCOPY/STENT PLACEMENT/ RIGHT RETROGRADE;  Surgeon: Festus Aloe, MD;  Location: [facility];  Service: Urology;  Laterality: Right;  . DX LAPAROSCOPY/  EGD/  HIATAL HERNIA REPAIR CLOSURE OF INTERNAL MENSENTRY DEFECT  10-28-2017   @[facility] ,  [place]  . EXTRACORPOREAL SHOCK WAVE LITHOTRIPSY N/A 01/30/2019   Procedure: EXTRACORPOREAL SHOCK WAVE LITHOTRIPSY (ESWL) LEFT/ POSSIBLE RIGHT;  Surgeon: Festus Aloe, MD;  Location: [facility];  Service: Urology;  Laterality: N/A;  . EXTRACORPOREAL SHOCK WAVE LITHOTRIPSY Right 03/20/2019   Procedure: EXTRACORPOREAL SHOCK WAVE LITHOTRIPSY (ESWL);  Surgeon: Franchot Gallo, MD;  Location: [facility];  Service: Urology;  Laterality: Right;  . LAPAROSCOPIC CHOLECYSTECTOMY  10-15-2016   @[facility]  ,  [place]  . TONSILLECTOMY AND ADENOIDECTOMY  child  . URETEROSCOPY WITH HOLMIUM LASER LITHOTRIPSY Right 09/10/2017   Procedure: URETEROSCOPY WITH HOLMIUM LASER LITHOTRIPSY;  Surgeon: Abbie Sons, MD;  Location: [facility];  Service: Urology;  Laterality: Right;    Family History  Problem Relation Age of Onset  .  Alcohol abuse Mother   . Hyperlipidemia Mother   . Hypertension Mother   . Lymphoma Mother        primary site : brain  . Hyperlipidemia Father   . Hypertension Father   . Parkinson's disease Father   . Drug abuse Sister   . Cancer Maternal Grandmother        breast cancer  . Breast cancer Maternal Grandmother   . Cancer Paternal  Grandmother   . Kidney cancer Neg Hx   . Prostate cancer Neg Hx     SOCIAL HX: ***   Current Outpatient Medications:  .  ciprofloxacin (CIPRO) 500 MG tablet, Take 1 tablet (500 mg total) by mouth at bedtime., Disp: 3 tablet, Rfl: 0 .  hydrOXYzine (ATARAX/VISTARIL) 10 MG tablet, TAKE 1 TABLET BY MOUTH THREE TIMES A DAY AS NEEDED (Patient not taking: Reported on 04/21/2019), Disp: 270 tablet, Rfl: 0 .  OVER THE COUNTER MEDICATION, , Disp: , Rfl:   EXAM:  VITALS per patient if applicable:  GENERAL: alert, oriented, appears well and in no acute distress  HEENT: atraumatic, conjunttiva clear, no obvious abnormalities on inspection of external nose and ears  NECK: normal movements of the head and neck  LUNGS: on inspection no signs of respiratory distress, breathing rate appears normal, no obvious gross SOB, gasping or wheezing  CV: no obvious cyanosis  MS: moves all visible extremities without noticeable abnormality  PSYCH/NEURO: pleasant and cooperative, no obvious depression or anxiety, speech and thought processing grossly intact  ASSESSMENT AND PLAN:  Discussed the following assessment and plan:  No diagnosis found.     I discussed the assessment and treatment plan with the patient. The patient was provided an opportunity to ask questions and all were answered. The patient agreed with the plan and demonstrated an understanding of the instructions.   The patient was advised to call back or seek an in-person evaluation if the symptoms worsen or if the condition fails to improve as anticipated.   [name], FNP

## 2019-05-03 NOTE — Patient Instructions (Signed)
Today we discussed referrals, orders. US thyroid, endocrinology   I have placed these orders in the system for you.  Please be sure to give [place] a call if you have not heard from our office regarding this. We should hear from [place] within ONE week with information regarding your appointment. If not, please let me know immediately.    Let me know how you are doing

## 2019-05-03 NOTE — Progress Notes (Signed)
Subjective:    Patient ID: [name], female    DOB: 09-05-1966, 54 y.o.   MRN: [id number]  CC: [name] is a 53 y.o. female who presents today for follow up.   HPI: Here today to primarily discuss osteoporosis.  She is very concerned about her recent bone density scan.  Is also concerned that she has had a history of elevations in PTH without a formal evaluation.  She has been unable to take calcium oxalate due to renal stones historically however advised of late that she may take with fat soluable foods.    Mother had recently passed away of brain cancer, which has been hard for her Memory loss- improved.  Feels some stress related, and reassured by MRI brain  Starting to work out with Physiological scientist.  Has been seeing Dr Gardenia Phlegm; she doesn't feel the  need to have any follow-up with him any more.   Seeing GI consult for elevated liver enzymes.   H/o bariatric surgery - starting vitamins again since been off for year and doesn't feel contributory to renal stones; follows annually with Dr Darnell Level   Right renal stone- s/p right stent with Dr Junious Silk   Of late had noticed some trouble swallowing and in the past it was her thyroid- thinks related to thyroid and would like evaluated.   HISTORY:  Past Medical History:  Diagnosis Date  . Asthma due to seasonal allergies    no inhaler  . History of diabetes mellitus, type II    per pt resolved after bariatric surgery 2017  . History of hypertension    per pt resolved after bariatric surgery 2017  . History of kidney stones   . History of obstructive sleep apnea    per pt hx osa w/ cpap,  retested after bariatric surgery 2017 result negative  . History of skin cancer    nose, not melanoma  . Hydronephrosis, right   . Hyperlipidemia   . MRSA carrier   . NAFL (nonalcoholic fatty liver)   . Osteoporosis   . Renal calculi    per CT 04-18-2019 bilateral , non-obstructive  . S/P bariatric surgery  08/03/2016   duodenal switch w/ hh repair  . Ureteral calculus, right   . Wears glasses    Past Surgical History:  Procedure Laterality Date  . BARIATRIC SURGERY  08-03-2016    dr Darnell Level @[facility] ,     LAPAROSCOPY DUODENAL SWITCH AND HIATAL HERNIA REPAIR  . BREAST BIOPSY Right 2017   FIBROCYSTIC CHANGES WITH CALCIFICATIONS  . CESAREAN SECTION  x2  last one 09-10-2000  . CYSTOSCOPY WITH RETROGRADE PYELOGRAM, URETEROSCOPY AND STENT PLACEMENT Right 02-07-2012  @[facility]   . CYSTOSCOPY WITH STENT PLACEMENT Right 09/10/2017   Procedure: CYSTOSCOPY WITH STENT PLACEMENT;  Surgeon: Abbie Sons, MD;  Location: [facility];  Service: Urology;  Laterality: Right;  . CYSTOSCOPY/URETEROSCOPY/HOLMIUM LASER/STENT PLACEMENT Right 04/24/2019   Procedure: CYSTOSCOPY/URETEROSCOPY/STENT PLACEMENT/ RIGHT RETROGRADE;  Surgeon: Festus Aloe, MD;  Location: [facility];  Service: Urology;  Laterality: Right;  . DX LAPAROSCOPY/  EGD/  HIATAL HERNIA REPAIR CLOSURE OF INTERNAL MENSENTRY DEFECT  10-28-2017   @[facility] ,  [place]  . EXTRACORPOREAL SHOCK WAVE LITHOTRIPSY N/A 01/30/2019   Procedure: EXTRACORPOREAL SHOCK WAVE LITHOTRIPSY (ESWL) LEFT/ POSSIBLE RIGHT;  Surgeon: Festus Aloe, MD;  Location: [facility];  Service: Urology;  Laterality: N/A;  . EXTRACORPOREAL SHOCK WAVE LITHOTRIPSY Right 03/20/2019   Procedure: EXTRACORPOREAL SHOCK WAVE LITHOTRIPSY (ESWL);  Surgeon: Franchot Gallo, MD;  Location: [facility];  Service: Urology;  Laterality: Right;  . LAPAROSCOPIC CHOLECYSTECTOMY  10-15-2016   @[facility]  ,  [place]  . TONSILLECTOMY AND ADENOIDECTOMY  child  . URETEROSCOPY WITH HOLMIUM LASER LITHOTRIPSY Right 09/10/2017   Procedure: URETEROSCOPY WITH HOLMIUM LASER LITHOTRIPSY;  Surgeon: Abbie Sons, MD;  Location: [facility];  Service: Urology;  Laterality: Right;   Family History  Problem Relation Age of Onset  . Alcohol abuse Mother   . Hyperlipidemia Mother   . Hypertension Mother   . Lymphoma  Mother        primary site : brain  . Hyperlipidemia Father   . Hypertension Father   . Parkinson's disease Father   . Drug abuse Sister   . Cancer Maternal Grandmother        breast cancer  . Breast cancer Maternal Grandmother   . Cancer Paternal Grandmother   . Kidney cancer Neg Hx   . Prostate cancer Neg Hx     Allergies: Latex, Tolmetin, Levemir [insulin detemir], Nsaids, and Gadolinium derivatives Current Outpatient Medications on File Prior to Visit  Medication Sig Dispense Refill  . OVER THE COUNTER MEDICATION      No current facility-administered medications on file prior to visit.     Social History   Tobacco Use  . Smoking status: Never Smoker  . Smokeless tobacco: Never Used  Substance Use Topics  . Alcohol use: No    Alcohol/week: 0.0 standard drinks  . Drug use: Never    Review of Systems  Constitutional: Negative for chills and fever.  HENT: Positive for trouble swallowing.   Respiratory: Negative for cough.   Cardiovascular: Negative for chest pain and palpitations.  Gastrointestinal: Negative for nausea and vomiting.      Objective:    BP 130/80   Pulse 71   Temp 98.4 F (36.9 C)   Wt 176 lb 6.4 oz (80 kg)   LMP 07/11/2016 (Exact Date)   SpO2 96%   BMI 26.82 kg/m  BP Readings from Last 3 Encounters:  05/03/19 130/80  04/24/19 (!) 145/88  03/20/19 (!) 174/88   Wt Readings from Last 3 Encounters:  05/03/19 176 lb 6.4 oz (80 kg)  04/24/19 175 lb 4 oz (79.5 kg)  03/20/19 179 lb (81.2 kg)    Physical Exam Vitals signs reviewed.  Constitutional:      Appearance: She is well-developed.  Eyes:     Conjunctiva/sclera: Conjunctivae normal.  Cardiovascular:     Rate and Rhythm: Normal rate and regular rhythm.     Pulses: Normal pulses.     Heart sounds: Normal heart sounds.  Pulmonary:     Effort: Pulmonary effort is normal.     Breath sounds: Normal breath sounds. No wheezing, rhonchi or rales.  Skin:    General: Skin is warm and dry.   Neurological:     Mental Status: She is alert.  Psychiatric:        Speech: Speech normal.        Behavior: Behavior normal.        Thought Content: Thought content normal.        Assessment & Plan:   Problem List Items Addressed This Visit      Digestive   Dysphagia    Etiology unclear.  Pending ultrasound evaluation of thyroid.  If normal, will ask patient to speak with GI in regards to this.        Musculoskeletal and Integument   Osteoporosis    Long discussion  with patient in regards to her complexity particularly osteoporosis at such a young age, burden of renal stones and even memory complaints.  Status post bariatric surgery?  Absorption issue as well.  agree with her that I will consult with endocrine as most appropriate considering her PTH has been abnormal.  Her calcium levels thus far have been normal.  Referral to endocrine has been placed      Relevant Orders   Ambulatory referral to Endocrinology     Genitourinary   Nephrolithiasis    Chronic, following with urology.  Will follow        Other   Memory loss    Improved at this time.  No further evaluation at this time.       Other Visit Diagnoses    Enlarged thyroid    -  Primary   Relevant Orders   US THYROID       I have discontinued Dalexa D. Fludd's hydrOXYzine and ciprofloxacin. I am also having her maintain her OVER THE COUNTER MEDICATION.   No orders of the defined types were placed in this encounter.   Return precautions given.   Risks, benefits, and alternatives of the medications and treatment plan prescribed today were discussed, and patient expressed understanding.   Education regarding symptom management and diagnosis given to patient on AVS.  Continue to follow with [name], FNP for routine health maintenance.   [name] and I agreed with plan.   [name], FNP

## 2019-05-05 DIAGNOSIS — R1310 Dysphagia, unspecified: Secondary | ICD-10-CM | POA: Insufficient documentation

## 2019-05-05 NOTE — Assessment & Plan Note (Signed)
Improved at this time.  No further evaluation at this time.

## 2019-05-05 NOTE — Assessment & Plan Note (Signed)
Chronic, following with urology.  Will follow

## 2019-05-05 NOTE — Assessment & Plan Note (Signed)
Etiology unclear.  Pending ultrasound evaluation of thyroid.  If normal, will ask patient to speak with GI in regards to this.

## 2019-05-05 NOTE — Assessment & Plan Note (Signed)
Long discussion with patient in regards to her complexity particularly osteoporosis at such a young age, burden of renal stones and even memory complaints.  Status post bariatric surgery?  Absorption issue as well.  agree with her that I will consult with endocrine as most appropriate considering her PTH has been abnormal.  Her calcium levels thus far have been normal.  Referral to endocrine has been placed

## 2019-05-15 ENCOUNTER — Telehealth: Payer: Self-pay | Admitting: Family

## 2019-05-15 NOTE — Telephone Encounter (Signed)
See paperwork on your desk  Patient left for [place] [employer] insurance denial.  Can you call [employer] in regards to this and see what we can do?  Per patient, carotid US 04/13/19 not covered.  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  Can we send them any information which would support claim and allow to be covered?

## 2019-05-16 NOTE — Telephone Encounter (Signed)
I have faxed all relevant paperwork to [facility].

## 2019-05-16 NOTE — Telephone Encounter (Signed)
Thanks so much for doing that.  XR c spine is perfect along with office notes from 02/13/19. Let's see if that works .Marland KitchenMarland Kitchen

## 2019-05-17 ENCOUNTER — Encounter: Payer: Self-pay | Admitting: Family

## 2019-05-19 ENCOUNTER — Ambulatory Visit
Admission: RE | Admit: 2019-05-19 | Discharge: 2019-05-19 | Disposition: A | Discharge status: Home / Self Care | Payer: Managed Care, Other (non HMO) | Source: Ambulatory Visit | Attending: Family | Admitting: Family

## 2019-05-19 ENCOUNTER — Other Ambulatory Visit: Payer: Self-pay

## 2019-05-19 DIAGNOSIS — E049 Nontoxic goiter, unspecified: Secondary | ICD-10-CM | POA: Diagnosis not present

## 2019-05-22 ENCOUNTER — Other Ambulatory Visit: Payer: Self-pay | Admitting: Family

## 2019-05-22 DIAGNOSIS — E041 Nontoxic single thyroid nodule: Secondary | ICD-10-CM

## 2019-05-26 ENCOUNTER — Encounter: Payer: Self-pay | Admitting: Family

## 2019-06-06 ENCOUNTER — Encounter: Payer: Self-pay | Admitting: Gastroenterology

## 2019-06-06 ENCOUNTER — Other Ambulatory Visit: Payer: Self-pay

## 2019-06-06 ENCOUNTER — Ambulatory Visit (INDEPENDENT_AMBULATORY_CARE_PROVIDER_SITE_OTHER): Payer: Managed Care, Other (non HMO) | Admitting: Gastroenterology

## 2019-06-06 ENCOUNTER — Ambulatory Visit: Payer: Managed Care, Other (non HMO) | Admitting: Gastroenterology

## 2019-06-06 VITALS — BP 150/82 | HR 67 | Temp 98.2°F | Ht 68.0 in | Wt 178.4 lb

## 2019-06-06 DIAGNOSIS — K760 Fatty (change of) liver, not elsewhere classified: Secondary | ICD-10-CM

## 2019-06-06 DIAGNOSIS — Z1211 Encounter for screening for malignant neoplasm of colon: Secondary | ICD-10-CM

## 2019-06-06 DIAGNOSIS — R945 Abnormal results of liver function studies: Secondary | ICD-10-CM | POA: Diagnosis not present

## 2019-06-06 DIAGNOSIS — Z8371 Family history of colonic polyps: Secondary | ICD-10-CM

## 2019-06-06 DIAGNOSIS — R7989 Other specified abnormal findings of blood chemistry: Secondary | ICD-10-CM

## 2019-06-06 NOTE — Progress Notes (Signed)
Jonathon Bellows MD, MRCP(U.K) [address]  [place], [place] [address]  Main: [phone]  Fax: [phone]   Gastroenterology Consultation  Referring Provider:     [name], FNP Primary Care Physician:  [name], FNP Primary Gastroenterologist:  Dr. Jonathon Bellows  Reason for Consultation:     Abnormal liver function tests, fatty liver, colonoscopy        HPI:   [name] is a 53 y.o. y/o female referred for consultation & management  by Dr. Vidal Schwalbe, Yvetta Coder, FNP.    Note that in April 2020 elevated PTH, low vitamin D.  Normal iron studies First noted abnormality in LFT's in  2017 with transaminases being elevated.  CT abdomen and 04/29/2019 demonstrated no abnormality of the liver.  Right upper quadrant ultrasound demonstrated hepatic steatosis. Alcohol use no  Drug use no Over the counter herbal supplements no New medications no Abdominal pain no Tattoos professional tattoo 20 years back Military service no Prior blood transfusion no Incarceration no History of travel no Family history of liver disease no Recent change in weight yes lost a lot of weight after her gastric bypass surgery  Not had a prior colonoscopy.  She says that her mother had a history of colon polyps.  Past Medical History:  Diagnosis Date  . Asthma due to seasonal allergies    no inhaler  . History of diabetes mellitus, type II    per pt resolved after bariatric surgery 2017  . History of hypertension    per pt resolved after bariatric surgery 2017  . History of kidney stones   . History of obstructive sleep apnea    per pt hx osa w/ cpap,  retested after bariatric surgery 2017 result negative  . History of skin cancer    nose, not melanoma  . Hydronephrosis, right   . Hyperlipidemia   . MRSA carrier   . NAFL (nonalcoholic fatty liver)   . Osteoporosis   . Renal calculi    per CT 04-18-2019 bilateral , non-obstructive  . S/P bariatric surgery  08/03/2016   duodenal switch w/ hh repair  . Ureteral calculus, right   . Wears glasses     Past Surgical History:  Procedure Laterality Date  . BARIATRIC SURGERY  08-03-2016    dr Darnell Level @[facility] ,  [place]   LAPAROSCOPY DUODENAL SWITCH AND HIATAL HERNIA REPAIR  . BREAST BIOPSY Right 2017   FIBROCYSTIC CHANGES WITH CALCIFICATIONS  . CESAREAN SECTION  x2  last one 09-10-2000  . CYSTOSCOPY WITH RETROGRADE PYELOGRAM, URETEROSCOPY AND STENT PLACEMENT Right 02-07-2012  @[facility]   . CYSTOSCOPY WITH STENT PLACEMENT Right 09/10/2017   Procedure: CYSTOSCOPY WITH STENT PLACEMENT;  Surgeon: Abbie Sons, MD;  Location: [facility];  Service: Urology;  Laterality: Right;  . CYSTOSCOPY/URETEROSCOPY/HOLMIUM LASER/STENT PLACEMENT Right 04/24/2019   Procedure: CYSTOSCOPY/URETEROSCOPY/STENT PLACEMENT/ RIGHT RETROGRADE;  Surgeon: Festus Aloe, MD;  Location: [facility];  Service: Urology;  Laterality: Right;  . DX LAPAROSCOPY/  EGD/  HIATAL HERNIA REPAIR CLOSURE OF INTERNAL MENSENTRY DEFECT  10-28-2017   @[facility] ,  [place]  . EXTRACORPOREAL SHOCK WAVE LITHOTRIPSY N/A 01/30/2019   Procedure: EXTRACORPOREAL SHOCK WAVE LITHOTRIPSY (ESWL) LEFT/ POSSIBLE RIGHT;  Surgeon: Festus Aloe, MD;  Location: [facility];  Service: Urology;  Laterality: N/A;  . EXTRACORPOREAL SHOCK WAVE LITHOTRIPSY Right 03/20/2019   Procedure: EXTRACORPOREAL SHOCK WAVE LITHOTRIPSY (ESWL);  Surgeon: Franchot Gallo, MD;  Location: [facility];  Service: Urology;  Laterality: Right;  .  LAPAROSCOPIC CHOLECYSTECTOMY  10-15-2016   @[facility]  ,  [place]  . TONSILLECTOMY AND ADENOIDECTOMY  child  . URETEROSCOPY WITH HOLMIUM LASER LITHOTRIPSY Right 09/10/2017   Procedure: URETEROSCOPY WITH HOLMIUM LASER LITHOTRIPSY;  Surgeon: Abbie Sons, MD;  Location: [facility];  Service: Urology;  Laterality: Right;    Prior to Admission medications   Medication Sig Start Date End Date Taking? Authorizing Provider  OVER THE COUNTER MEDICATION      [provider]    Family History  Problem Relation Age of Onset  . Alcohol abuse Mother   . Hyperlipidemia Mother   . Hypertension Mother   . Lymphoma Mother        primary site : brain  . Hyperlipidemia Father   . Hypertension Father   . Parkinson's disease Father   . Drug abuse Sister   . Cancer Maternal Grandmother        breast cancer  . Breast cancer Maternal Grandmother   . Cancer Paternal Grandmother   . Kidney cancer Neg Hx   . Prostate cancer Neg Hx      Social History   Tobacco Use  . Smoking status: Never Smoker  . Smokeless tobacco: Never Used  Substance Use Topics  . Alcohol use: No    Alcohol/week: 0.0 standard drinks  . Drug use: Never    Allergies as of 06/06/2019 - Review Complete 06/06/2019  Allergen Reaction Noted  . Latex Shortness Of Breath and Cough 07/10/2016  . Tolmetin Hives 01/11/2014  . Levemir [insulin detemir] Hives 02/26/2016  . Nsaids Hives 01/11/2014  . Gadolinium derivatives Hives 03/15/2019    Review of Systems:    All systems reviewed and negative except where noted in HPI.   Physical Exam:  BP (!) 150/82   Pulse 67   Temp 98.2 F (36.8 C)   Ht 5\' 8"  (1.727 m)   Wt 178 lb 6.4 oz (80.9 kg)   LMP 07/11/2016 (Exact Date)   BMI 27.13 kg/m  Patient's last menstrual period was 07/11/2016 (exact date). Psych:  Alert and cooperative. Normal mood and affect. General:   Alert,  Well-developed, well-nourished, pleasant and cooperative in NAD Head:  Normocephalic and atraumatic. Eyes:  Sclera clear, no icterus.   Conjunctiva pink. Ears:  Normal auditory acuity. Nose:  No deformity, discharge, or lesions. Mouth:  No deformity or lesions,oropharynx pink & moist. Neck:  Supple; no masses or thyromegaly. Lungs:  Respirations even and unlabored.  Clear throughout to auscultation.   No wheezes, crackles, or rhonchi. No acute distress. Heart:  Regular rate and rhythm; no murmurs, clicks, rubs, or gallops. Abdomen:  Normal  bowel sounds.  No bruits.  Soft, non-tender and non-distended without masses, hepatosplenomegaly or hernias noted.  No guarding or rebound tenderness.    Neurologic:  Alert and oriented x3;  grossly normal neurologically. Skin:  Intact without significant lesions or rashes. No jaundice. Lymph Nodes:  No significant cervical adenopathy. Psych:  Alert and cooperative. Normal mood and affect.  Imaging Studies: US Thyroid  Result Date: 05/19/2019 CLINICAL DATA:  Palpable abnormality. Thyromegaly on physical examination. EXAM: THYROID ULTRASOUND TECHNIQUE: Ultrasound examination of the thyroid gland and adjacent soft tissues was performed. COMPARISON:  None. FINDINGS: Parenchymal Echotexture: Markedly heterogenous Isthmus: Normal in size measuring 0.4 cm in diameter, unchanged Right lobe: Borderline enlarged measuring 5.1 x 1.9 x 1.7 cm, unchanged previously, 5.0 x 1.9 x 1.7 cm Left lobe: Is borderline enlarged measuring 5.6 x 1.5 x 1.9 cm, unchanged, previously, 5.2 x 2.4  x 1.9 cm _________________________________________________________ Estimated total number of nodules >/= 1 cm: 1 Number of spongiform nodules >/=  2 cm not described below (TR1): 0 Number of mixed cystic and solid nodules >/= 1.5 cm not described below ([place]): 0 _________________________________________________________ Loraine Maple approximately 1.0 x 0.8 x 0.7 cm hypoechoic nodule within the mid, posterior aspect of the right lobe of the thyroid (labeled 1), is favored to represent a pseudonodule as it lacks defined borders on both provided transverse and sagittal images. The approximately 0.9 x 0.7 x 0.5 cm isoechoic ill-defined nodule within the mid, medial aspect the left lobe of the thyroid (labeled 2), does not meet imaging criteria to recommend percutaneous sampling or continued dedicated follow-up _________________________________________________________ Nodule # 3: Location: Left; Mid Maximum size: 2.5 cm; Other 2 dimensions: 2.1 x 1.9  cm Composition: mixed cystic and solid (1) Echogenicity: isoechoic (1) Shape: not taller-than-wide (0) Margins: smooth (0) Echogenic foci: macrocalcifications (1) ACR TI-RADS total points: 3. ACR TI-RADS risk category: TR3 (3 points). ACR TI-RADS recommendations: **Given size (>/= 2.5 cm) and appearance, fine needle aspiration of this mildly suspicious nodule should be considered based on TI-RADS criteria. _________________________________________________________ Benign-appearing non pathologically enlarged left cervical lymph nodes are incidentally noted with index left cervical lymph node measuring 0.5 cm in greatest short axis diameter, not enlarged by size criteria. IMPRESSION: 1. Findings suggestive of multinodular goiter. 2. Nodule #3 meets imaging criteria to recommend percutaneous sampling as clinically indicated. The above is in keeping with the ACR TI-RADS recommendations - J Am Coll Radiol 2017;14:587-595. Electronically Signed   By: Sandi Mariscal M.D.   On: 05/19/2019 15:31    Assessment and Plan:   [name] is a 53 y.o. y/o female has been referred for abnormal liver function test, fatty liver disease, colorectal cancer screening.  Plan 1.  Elevated alkaline phosphatase with elevated PTH and low vitamin D.  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  Recheck PTH, vitamin D, calcium, GGT. 2.  Transaminases have been historically elevated probably related to nonalcoholic fatty liver disease.  Will complete liver evaluation by ordering complete viral and autoimmune panel. 3.  Limit alcohol use, exercise, lose weight address cardiovascular risk factors such as hypertension hyperlipidemia tight glycemic control.  This would also help with the fatty liver disease. 4.  Colonoscopy.  I have discussed alternative options, risks & benefits,  which include, but are not limited to, bleeding, infection, perforation,respiratory complication & drug reaction.  The patient agrees with this plan &  written consent will be obtained.     Follow up in 3 to 4 months  Dr Jonathon Bellows MD,MRCP(U.K)

## 2019-06-10 LAB — HIV ANTIBODY (ROUTINE TESTING W REFLEX): HIV Screen 4th Generation wRfx: NONREACTIVE

## 2019-06-10 LAB — MITOCHONDRIAL/SMOOTH MUSCLE AB PNL
Mitochondrial Ab: 115.3 Units — ABNORMAL HIGH (ref 0.0–20.0)
Smooth Muscle Ab: 16 Units (ref 0–19)

## 2019-06-10 LAB — IMMUNOGLOBULINS A/E/G/M, SERUM
IgA/Immunoglobulin A, Serum: 350 mg/dL (ref 87–352)
IgE (Immunoglobulin E), Serum: 91 IU/mL (ref 6–495)
IgG (Immunoglobin G), Serum: 879 mg/dL (ref 586–1602)
IgM (Immunoglobulin M), Srm: 111 mg/dL (ref 26–217)

## 2019-06-10 LAB — CERULOPLASMIN: Ceruloplasmin: 19.9 mg/dL (ref 19.0–39.0)

## 2019-06-10 LAB — ALPHA-1-ANTITRYPSIN: A-1 Antitrypsin: 139 mg/dL (ref 101–187)

## 2019-06-10 LAB — HEPATITIS B SURFACE ANTIGEN: Hepatitis B Surface Ag: NEGATIVE

## 2019-06-10 LAB — VITAMIN D 1,25 DIHYDROXY
Vitamin D 1, 25 (OH)2 Total: 82 pg/mL — ABNORMAL HIGH
Vitamin D2 1, 25 (OH)2: <10 pg/mL
Vitamin D3 1, 25 (OH)2: 81 pg/mL

## 2019-06-10 LAB — HEPATITIS B CORE ANTIBODY, TOTAL: Hep B Core Total Ab: NEGATIVE

## 2019-06-10 LAB — ANA: Anti Nuclear Antibody (ANA): NEGATIVE

## 2019-06-10 LAB — HEPATITIS A ANTIBODY, TOTAL: hep A Total Ab: NEGATIVE

## 2019-06-10 LAB — HEPATITIS B E ANTIGEN: Hep B E Ag: NEGATIVE

## 2019-06-10 LAB — HEPATITIS B SURFACE ANTIBODY,QUALITATIVE: Hep B Surface Ab, Qual: REACTIVE

## 2019-06-10 LAB — PTH, INTACT AND CALCIUM
Calcium: 8.5 mg/dL — ABNORMAL LOW (ref 8.7–10.2)
PTH: 60 pg/mL (ref 15–65)

## 2019-06-10 LAB — CELIAC DISEASE AB SCREEN W/RFX
Antigliadin Abs, IgA: 3 units (ref 0–19)
Transglutaminase IgA: <2 U/mL (ref 0–3)

## 2019-06-10 LAB — HEPATITIS B E ANTIBODY: Hep B E Ab: NEGATIVE

## 2019-06-10 LAB — CK: Total CK: 99 U/L (ref 32–182)

## 2019-06-10 LAB — HEPATITIS C ANTIBODY: Hep C Virus Ab: <0.1 s/co ratio (ref 0.0–0.9)

## 2019-06-10 LAB — ANTI-MICROSOMAL ANTIBODY LIVER / KIDNEY: LKM1 Ab: 0.8 Units (ref 0.0–20.0)

## 2019-06-15 ENCOUNTER — Encounter: Payer: Self-pay | Admitting: Gastroenterology

## 2019-06-16 ENCOUNTER — Telehealth: Payer: Self-pay

## 2019-06-16 ENCOUNTER — Other Ambulatory Visit: Payer: Self-pay

## 2019-06-16 DIAGNOSIS — R7989 Other specified abnormal findings of blood chemistry: Secondary | ICD-10-CM

## 2019-06-16 DIAGNOSIS — R945 Abnormal results of liver function studies: Secondary | ICD-10-CM

## 2019-06-16 NOTE — Telephone Encounter (Signed)
Called pt to inform her of Dr. Georgeann Oppenheim recommendations.  Unable to contact, LVM to return call

## 2019-06-16 NOTE — Telephone Encounter (Signed)
-----   Message from Jonathon Bellows, MD sent at 06/16/2019  8:39 AM EDT ----- No diet will help , let her see me in 2-3 weeks time. Repeat LFT, GGT before visit   ----- Message ----- From: [name], [place] Sent: 06/15/2019   4:52 PM EDT To: Jonathon Bellows, MD  Dr. Vicente Males, pt states she has been experiencing itchy skin and fatigue. She's scheduled to follow up with you in October, would you like to see her sooner? Also pt wants to know if there's a certain diet she could follow to help with her condition. Please advise.  Sherald Hess ----- Message ----- From: Jonathon Bellows, MD Sent: 06/15/2019  11:36 AM EDT To: [name], FNP, [name], CMA  Shoshannah Faubert inform   1. Needs Hep A vaccine as not immune 2. 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 Sometimes may be from fatty liver disease 3. Check if she has any itching of the skin particularly at night , fatigue? 4. IF present nothing to do more in terms of testing - I will discuss treatment at office visit 5. If the symptoms are absent may need a liver biopsy to determine if she has autoimmune liver disease- can be discussed at office visit please  C/c [name], Yvetta Coder, FNP   Dr Jonathon Bellows MD,MRCP [facility]) Gastroenterology/Hepatology Pager: [phone]

## 2019-06-20 NOTE — Telephone Encounter (Signed)
-----   Message from Jonathon Bellows, MD sent at 06/16/2019  8:39 AM EDT ----- No diet will help , let her see me in 2-3 weeks time. Repeat LFT, GGT before visit   ----- Message ----- From: [name], [place] Sent: 06/15/2019   4:52 PM EDT To: Jonathon Bellows, MD  Dr. Vicente Males, pt states she has been experiencing itchy skin and fatigue. She's scheduled to follow up with you in October, would you like to see her sooner? Also pt wants to know if there's a certain diet she could follow to help with her condition. Please advise.  [name] ----- Message ----- From: Jonathon Bellows, MD Sent: 06/15/2019  11:36 AM EDT To: [name], FNP, [name], CMA  [name] inform   1. Needs Hep A vaccine as not immune 2. 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 Sometimes may be from fatty liver disease 3. Check if she has any itching of the skin particularly at night , fatigue? 4. IF present nothing to do more in terms of testing - I will discuss treatment at office visit 5. If the symptoms are absent may need a liver biopsy to determine if she has autoimmune liver disease- can be discussed at office visit please  C/c [name], Sarah Coder, FNP   Dr Jonathon Bellows MD,MRCP [facility]) Gastroenterology/Hepatology Pager: [phone]

## 2019-06-20 NOTE — Telephone Encounter (Signed)
Spoke with pt and informed her of Dr. Georgeann Oppenheim recommendations. Pt agrees.

## 2019-06-26 ENCOUNTER — Other Ambulatory Visit: Payer: Self-pay

## 2019-06-26 ENCOUNTER — Encounter: Payer: Self-pay | Admitting: Internal Medicine

## 2019-06-26 ENCOUNTER — Ambulatory Visit (INDEPENDENT_AMBULATORY_CARE_PROVIDER_SITE_OTHER): Payer: Managed Care, Other (non HMO) | Admitting: Internal Medicine

## 2019-06-26 VITALS — BP 124/82 | HR 51 | Temp 98.2°F | Ht 68.0 in | Wt 179.4 lb

## 2019-06-26 DIAGNOSIS — M816 Localized osteoporosis [Lequesne]: Secondary | ICD-10-CM | POA: Diagnosis not present

## 2019-06-26 DIAGNOSIS — N2581 Secondary hyperparathyroidism of renal origin: Secondary | ICD-10-CM | POA: Diagnosis not present

## 2019-06-26 DIAGNOSIS — E042 Nontoxic multinodular goiter: Secondary | ICD-10-CM | POA: Diagnosis not present

## 2019-06-26 DIAGNOSIS — Z9884 Bariatric surgery status: Secondary | ICD-10-CM

## 2019-06-26 NOTE — Patient Instructions (Signed)
Vitamin D 3000 iu daily  Two tablets of multivitamin  Two tablets of Calcium citrate Twice a day ~ 1200 mg of calcium daily  One vitamin B12 daily

## 2019-06-26 NOTE — Progress Notes (Signed)
Name: [name]  MRN/ DOB: [id number], 1966/05/14    Age/ Sex: 53 y.o., female    PCP: [name], FNP   Reason for Endocrinology Evaluation: Thyroid nodule      Date of Initial Endocrinology Evaluation: 06/26/2019     HPI: [name] is a 53 y.o. female with a past medical history of HTN,T2DM and Hx of gastric bypass. The patient presented for initial endocrinology clinic visit on 06/26/2019 for consultative assistance with her thyroid nodule   Was diagnosed with MNG in 2006. Per pt she had drainage of a thyroid cyst with benign cytology.    She presented again to her PCP recently with c/o worsening neck enlargement, associated with dysphagia,and discomfort. These symptoms have been there for ~ 4-5 months prior to her presentation.    She is S/P Gastric Bypass sx in  07/2016 , Pre-surgery weight was 333 Lbs, lowest weight since sx 150 lbs. She was on 2 bariatric vitamins up to a year ago and was stopped due to recurrent renal stones, stopping the vitamins did not help and had ~ 17 renal stones in the past year. Pt had a DXA 04/14/2019 with a T-Score of -3.0 which prompted her to start taking her vitamins ~ 2 weeks but ran out of them and has been back on them for a few days.    HISTORY:  Past Medical History:  Past Medical History:  Diagnosis Date  . Asthma due to seasonal allergies    no inhaler  . History of diabetes mellitus, type II    per pt resolved after bariatric surgery 2017  . History of hypertension    per pt resolved after bariatric surgery 2017  . History of kidney stones   . History of obstructive sleep apnea    per pt hx osa w/ cpap,  retested after bariatric surgery 2017 result negative  . History of skin cancer    nose, not melanoma  . Hydronephrosis, right   . Hyperlipidemia   . MRSA carrier   . NAFL (nonalcoholic fatty liver)   . Osteoporosis   . Renal calculi    per CT 04-18-2019 bilateral , non-obstructive  . S/P  bariatric surgery 08/03/2016   duodenal switch w/ hh repair  . Ureteral calculus, right   . Wears glasses    Past Surgical History:  Past Surgical History:  Procedure Laterality Date  . BARIATRIC SURGERY  08-03-2016    dr Darnell Level @[facility] ,  [place]   LAPAROSCOPY DUODENAL SWITCH AND HIATAL HERNIA REPAIR  . BREAST BIOPSY Right 2017   FIBROCYSTIC CHANGES WITH CALCIFICATIONS  . CESAREAN SECTION  x2  last one 09-10-2000  . CYSTOSCOPY WITH RETROGRADE PYELOGRAM, URETEROSCOPY AND STENT PLACEMENT Right 02-07-2012  @[facility]   . CYSTOSCOPY WITH STENT PLACEMENT Right 09/10/2017   Procedure: CYSTOSCOPY WITH STENT PLACEMENT;  Surgeon: Abbie Sons, MD;  Location: [facility];  Service: Urology;  Laterality: Right;  . CYSTOSCOPY/URETEROSCOPY/HOLMIUM LASER/STENT PLACEMENT Right 04/24/2019   Procedure: CYSTOSCOPY/URETEROSCOPY/STENT PLACEMENT/ RIGHT RETROGRADE;  Surgeon: Festus Aloe, MD;  Location: [facility];  Service: Urology;  Laterality: Right;  . DX LAPAROSCOPY/  EGD/  HIATAL HERNIA REPAIR CLOSURE OF INTERNAL MENSENTRY DEFECT  10-28-2017   @[facility] ,  [place]  . EXTRACORPOREAL SHOCK WAVE LITHOTRIPSY N/A 01/30/2019   Procedure: EXTRACORPOREAL SHOCK WAVE LITHOTRIPSY (ESWL) LEFT/ POSSIBLE RIGHT;  Surgeon: Festus Aloe, MD;  Location: [facility];  Service: Urology;  Laterality: N/A;  . EXTRACORPOREAL SHOCK WAVE LITHOTRIPSY Right 03/20/2019  Procedure: EXTRACORPOREAL SHOCK WAVE LITHOTRIPSY (ESWL);  Surgeon: Franchot Gallo, MD;  Location: [facility];  Service: Urology;  Laterality: Right;  . LAPAROSCOPIC CHOLECYSTECTOMY  10-15-2016   @[facility]  ,  [place]  . TONSILLECTOMY AND ADENOIDECTOMY  child  . URETEROSCOPY WITH HOLMIUM LASER LITHOTRIPSY Right 09/10/2017   Procedure: URETEROSCOPY WITH HOLMIUM LASER LITHOTRIPSY;  Surgeon: Abbie Sons, MD;  Location: [facility];  Service: Urology;  Laterality: Right;      Social History:  reports that she has never smoked. She has never used smokeless tobacco. She  reports that she does not drink alcohol or use drugs.  Family History: family history includes Alcohol abuse in her mother; Breast cancer in her maternal grandmother; Cancer in her maternal grandmother and paternal grandmother; Drug abuse in her sister; Hyperlipidemia in her father and mother; Hypertension in her father and mother; Lymphoma in her mother; Parkinson's disease in her father.   HOME MEDICATIONS: Allergies as of 06/26/2019      Reactions   Latex Shortness Of Breath, Cough   Tolmetin Hives   Levemir [insulin Detemir] Hives   Nsaids Hives   Per pt stated has to take ibuprofen with a antihistimine   Gadolinium Derivatives Hives   Pt c/o itching after contrast injection 03/15/19 @ 8:45am. [facility] Per Dr. Kathlene Cote document this as allergic reaction.       Medication List       Accurate as of June 26, 2019  9:49 AM. If you have any questions, ask your nurse or doctor.        OVER THE COUNTER MEDICATION         REVIEW OF SYSTEMS: A comprehensive ROS was conducted with the patient and is negative except as per HPI and below:  Review of Systems  Constitutional: Positive for weight loss. Negative for fever.  HENT: Negative for congestion and sore throat.   Eyes: Negative for blurred vision and pain.  Respiratory: Negative for cough and shortness of breath.   Cardiovascular: Positive for leg swelling. Negative for palpitations.  Gastrointestinal: Negative for diarrhea and nausea.  Musculoskeletal: Positive for joint pain. Negative for myalgias.  Skin: Negative.   Neurological: Positive for tingling. Negative for tremors.       OBJECTIVE:  VS: BP 124/82 (BP Location: Left Arm, Patient Position: Sitting, Cuff Size: Normal)   Pulse (!) 51   Temp 98.2 F (36.8 C)   Ht 5\' 8"  (1.727 m)   Wt 179 lb 6.4 oz (81.4 kg)   LMP 07/11/2016 (Exact Date)   SpO2 98%   BMI 27.28 kg/m    Wt Readings from Last 3 Encounters:  06/26/19 179 lb 6.4 oz (81.4 kg)  06/06/19 178 lb 6.4 oz  (80.9 kg)  05/03/19 176 lb 6.4 oz (80 kg)     EXAM: General: Pt appears well and is in NAD  Hydration: Well-hydrated with moist mucous membranes and good skin turgor  Eyes: External eye exam normal without stare, lid lag or exophthalmos.  EOM intact.   Ears, Nose, Throat: Hearing: Grossly intact bilaterally Dental: Good dentition  Throat: Clear without mass, erythema or exudate  Neck: General: Supple without adenopathy. Thyroid: Thyroid size normal.  Left thyroid nodule appreciated.  Lungs: Clear with good BS bilat with no rales, rhonchi, or wheezes  Heart: Auscultation: RRR.  Abdomen: Normoactive bowel sounds, soft, nontender, without masses or organomegaly palpable  Extremities:  BL LE: Trace pretibial edema normal ROM and strength.  Skin: Hair: Texture and amount normal with gender appropriate  distribution Skin Inspection: No rashes. Skin Palpation: Skin temperature, texture, and thickness normal to palpation  Neuro: Cranial nerves: II - XII grossly intact  Motor: Normal strength throughout DTRs: 2+ and symmetric in UE without delay in relaxation phase  Mental Status: Judgment, insight: Intact Orientation: Oriented to time, place, and person Mood and affect: No depression, anxiety, or agitation     DATA REVIEWED: Results for ADALAIDE, JASKOLSKI (MRN [id number]) as of 06/26/2019 09:40  Ref. Range 02/27/2019 09:19  TSH Latest Ref Range: 0.450 - 4.500 uIU/mL 2.080  Triiodothyronine,Free,Serum Latest Ref Range: 2.0 - 4.4 pg/mL 2.7    Results for CASSIDEY, BARRALES (MRN [id number]) as of 06/26/2019 09:40  Ref. Range 03/13/2019 08:52  Sodium Latest Ref Range: 134 - 144 mmol/L 142  Potassium Latest Ref Range: 3.5 - 5.2 mmol/L 4.4  Chloride Latest Ref Range: 96 - 106 mmol/L 108 (H)  CO2 Latest Ref Range: 20 - 29 mmol/L 23  Glucose Latest Ref Range: 65 - 99 mg/dL 83  BUN Latest Ref Range: 6 - 24 mg/dL 13  Creatinine Latest Ref Range: 0.57 - 1.00 mg/dL 0.74  Calcium Latest Ref  Range: 8.7 - 10.2 mg/dL 8.4 (L)  BUN/Creatinine Ratio Latest Ref Range: 9 - 23  18  Alkaline Phosphatase Latest Ref Range: 39 - 117 IU/L 149 (H)  Albumin Latest Ref Range: 3.8 - 4.9 g/dL 3.6 (L)  Albumin/Globulin Ratio Latest Ref Range: 1.2 - 2.2  1.6  AST Latest Ref Range: 0 - 40 IU/L 63 (H)  ALT Latest Ref Range: 0 - 32 IU/L 62 (H)  Total Protein Latest Ref Range: 6.0 - 8.5 g/dL 5.9 (L)  Total Bilirubin Latest Ref Range: 0.0 - 1.2 mg/dL 0.2  GFR, Est Non African American Latest Ref Range: >59 mL/min/1.73 93  GFR, Est African American Latest Ref Range: >59 mL/min/1.73 108  Results for RYA, RAUSCH (MRN [id number]) as of 06/26/2019 09:40  Ref. Range 03/07/2019 10:40 03/13/2019 08:52 06/06/2019 13:44  PTH, Intact Latest Ref Range: 15 - 65 pg/mL 73 (H)  60    Thyroid ultrasound (2006)   In the RIGHT lobe of the thyroid in the midinferior portion is a solid  nodule measuring 1.0 x .8 x .85 cm and a second nodule inferiorly  measuring 1.3 x 1.1 x .73 cm.   In the LEFT lobe is a complex nodule measuring 3.2 x  3.7 x 2.6 cm.  There appears a septa within this lesion as well as some  soft  tissue  Thyroid ultrasound 05/19/2019  Questioned approximately 1.0 x 0.8 x 0.7 cm hypoechoic nodule within the mid, posterior aspect of the right lobe of the thyroid (labeled 1), is favored to represent a pseudonodule as it lacks defined borders on both provided transverse and sagittal images.  The approximately 0.9 x 0.7 x 0.5 cm isoechoic ill-defined nodule within the mid, medial aspect the left lobe of the thyroid (labeled 2), does not meet imaging criteria to recommend percutaneous sampling or continued dedicated follow-up  _________________________________________________________  Nodule # 3:  Location: Left; Mid  Maximum size: 2.5 cm; Other 2 dimensions: 2.1 x 1.9 cm  Composition: mixed cystic and solid (1)  Echogenicity: isoechoic (1)  Shape: not taller-than-wide (0)   Margins: smooth (0)  Echogenic foci: macrocalcifications (1)  ACR TI-RADS total points: 3.  ACR TI-RADS risk category: TR3 (3 points).  ACR TI-RADS recommendations:  Given size (>/= 2.5 cm) and appearance, fine needle aspiration of this mildly suspicious nodule should be  considered based on TI-RADS criteria.   DXA 04/14/2019  Site Region Measured Measured WHO Young Adult BMD Date       Age      Classification T-score AP Spine L1-L3 04/14/2019 52.6 Osteoporosis -3.0 0.818 g/cm2  DualFemur Neck Right 04/14/2019 52.6 Osteopenia -1.2 0.869 g/cm2  DualFemur Total Mean 04/14/2019 52.6 Normal -1.0 0.879 g/cm2  Left Forearm Radius 33% 04/14/2019 52.6 Osteoporosis -2.8 0.627 g/cm2  ASSESSMENT/PLAN/RECOMMENDATIONS:   1. Multinodular Goiter:   - Pt is clinically and biochemically euthyroid  - She does endorse some local neck symptoms.  - In 2006 her left thyroid nodule was 3.7 cm which apparently had been drained, repeat ultrasound 2020 shows a 2.5 cm nodule on the left which is reassuring that this is benign given the stability over the years.  - Despite that info and since I don't have cytology report from 2006 when it was biopsied, I would recommend proceeding with FNA. - Pt understands that if her symptoms are severe enough, she could proceed with surgical resection of the left lobe regardless of the cytology report.     2. 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  - She could also have a combination of osteoporosis and osteomalacia, they will both look at the same on DXA , the only way to differentiate is through biochemical testing.  - We discussed the importance of calcium/vitamin D intake to build her bones, adding any bisphosphonates at this time would cause more harm to her bones.  - We discussed the endocrine society mineral  nutrient supplementations as below  - I was planning on drawing labs on her today but pt stated she is going to see her bariatric surgeon soon and they do all kind of blood  Work, will obtain those.    Medications :  Vitamin D 3000 iu daily  Two tablets of multivitamin  Two tablets of Calcium citrate Twice a day ~ 1200 mg of calcium daily  One vitamin B12 daily     3. Secondary Hyperparathyroidism :   - This is most likely secondary to decrease calcium and vitamin D absorption  - Needs to replenish    4. Renal stones   - She is under the impression her calcium supplements caused renal stones, I explained to her that with gastric bypass she has increase oxalate and not enough  Calcium  Which causes the renal stones. Pt should be on calcium stones to help reduce the risk of renal stones and NOT vice versa.   F/u in 6 months    Signed electronically by: Mack Guise, MD  [facility]  [facility] [address] [place], [place] [address] Phone: [phone] FAX: [phone]   CC: [name], FNP [address] [place] [address] Phone: [phone] Fax: [phone]   Return to Endocrinology clinic as below: Future Appointments  Date Time Provider [place]  07/04/2019  1:00 PM Jonathon Bellows, MD [facility] None  12/26/2019  3:40 PM , Melanie Crazier, MD [facility] None

## 2019-07-01 LAB — HEPATIC FUNCTION PANEL
ALT: 46 IU/L — ABNORMAL HIGH (ref 0–32)
AST: 43 IU/L — ABNORMAL HIGH (ref 0–40)
Albumin: 3.8 g/dL (ref 3.8–4.9)
Alkaline Phosphatase: 171 IU/L — ABNORMAL HIGH (ref 39–117)
Bilirubin Total: 0.3 mg/dL (ref 0.0–1.2)
Bilirubin, Direct: 0.09 mg/dL (ref 0.00–0.40)
Total Protein: 6.2 g/dL (ref 6.0–8.5)

## 2019-07-01 LAB — GAMMA GT: GGT: 17 IU/L (ref 0–60)

## 2019-07-04 ENCOUNTER — Ambulatory Visit (INDEPENDENT_AMBULATORY_CARE_PROVIDER_SITE_OTHER): Payer: Managed Care, Other (non HMO) | Admitting: Gastroenterology

## 2019-07-04 ENCOUNTER — Other Ambulatory Visit: Payer: Self-pay

## 2019-07-04 ENCOUNTER — Telehealth: Payer: Self-pay

## 2019-07-04 VITALS — BP 136/79 | HR 74 | Temp 97.8°F | Ht 68.0 in | Wt 180.4 lb

## 2019-07-04 DIAGNOSIS — R945 Abnormal results of liver function studies: Secondary | ICD-10-CM

## 2019-07-04 DIAGNOSIS — R7989 Other specified abnormal findings of blood chemistry: Secondary | ICD-10-CM

## 2019-07-04 DIAGNOSIS — R768 Other specified abnormal immunological findings in serum: Secondary | ICD-10-CM

## 2019-07-04 NOTE — Telephone Encounter (Signed)
Spoke with [facility] to cancel pt colonoscopy procedure. Pt states she'll contact our office to reschedule.

## 2019-07-04 NOTE — Progress Notes (Signed)
Jonathon Bellows MD, MRCP(U.K) [address]  [place]  [place], [place] [address]  Main: [phone]  Fax: [phone]   Primary Care Physician: [name], FNP  Primary Gastroenterologist:  Dr. Jonathon Bellows   Follow-up for abnormal liver function tests  HPI: [name] is a 53 y.o. female here today to see me to follow-up for abnormal liver function test.  She was initially seen on 06/06/2019.  LFTs were first noted to be abnormal back in 2017.  With transaminases being elevated.  CT abdomen on 04/29/2019 demonstrated no abnormality of the liver.  Right upper quadrant ultrasound demonstrated hepatic steatosis.  In April 2020 parathormone was elevated.  Low vitamin D.  Normal iron studies.   06/30/2019:GGT normal, Alk phos 171, AST 43, ALT 46.  Vitamin D normal.  Hepatitis A total antibody negative.  Positive antibody for hepatitis B surface antibody.  Hepatitis B surface antigen negative.  Hepatitis C virus antibody, HIV, ANA negative.  Smooth muscle antibody negative but antimitochondrial antibody 115 strongly positive.  Ceruloplasmin 19.9.  Celiac serology negative.  Alpha-1 antitrypsin normal.  Anti-LK M antibody negative.  CK 99.  PTH normal.   She states that her liver function tests have been high since she was in college.  She feels that her daughters to have had abnormal liver function test which she has suggested them to get evaluated for as well.  She complains of pruritus over her arms and feet.  She also have a lot of fatigue but she thinks that due to issues at home.  Current Outpatient Medications  Medication Sig Dispense Refill  . OVER THE COUNTER MEDICATION      No current facility-administered medications for this visit.     Allergies as of 07/04/2019 - Review Complete 06/26/2019  Allergen Reaction Noted  . Latex Shortness Of Breath and Cough 07/10/2016  . Tolmetin Hives 01/11/2014  . Levemir [insulin detemir] Hives 02/26/2016  . Nsaids Hives  01/11/2014  . Gadolinium derivatives Hives 03/15/2019    ROS:  General: Negative for anorexia, weight loss, fever, chills, fatigue, weakness. ENT: Negative for hoarseness, difficulty swallowing , nasal congestion. CV: Negative for chest pain, angina, palpitations, dyspnea on exertion, peripheral edema.  Respiratory: Negative for dyspnea at rest, dyspnea on exertion, cough, sputum, wheezing.  GI: See history of present illness. GU:  Negative for dysuria, hematuria, urinary incontinence, urinary frequency, nocturnal urination.  Endo: Negative for unusual weight change.    Physical Examination:   LMP 07/11/2016 (Exact Date)   General: Well-nourished, well-developed in no acute distress.  Eyes: No icterus. Conjunctivae pink. Mouth: Oropharyngeal mucosa moist and pink , no lesions erythema or exudate. Lungs: Clear to auscultation bilaterally. Non-labored. Heart: Regular rate and rhythm, no murmurs rubs or gallops.  Abdomen: Bowel sounds are normal, nontender, nondistended, no hepatosplenomegaly or masses, no abdominal bruits or hernia , no rebound or guarding.   Extremities: No lower extremity edema. No clubbing or deformities. Neuro: Alert and oriented x 3.  Grossly intact. Skin: Warm and dry, no jaundice.   Psych: Alert and cooperative, normal mood and affect.   Imaging Studies: No results found.  Assessment and Plan:   [name] is a 53 y.o. y/o female follow-up for abnormal liver function test, fatty liver disease, colorectal cancer screening.  With a positive antimitochondrial antibody I am very suspicious of primary biliary cholangitis.  Plan 1.  MRCP- if negative needs liver biopsy to confirm diagnosis of PBC and stage liver disease.  2.  Limit alcohol use, exercise, lose weight address cardiovascular risk factors such as hypertension hyperlipidemia tight glycemic control.  This would also help with the fatty liver disease. 3. Hepatitis A immunization,  pneumococcal vaccine.  4.  Colonoscopy.  She will call in in October to schedule.  Dr Jonathon Bellows  MD,MRCP [facility]) Follow up in 6 weeks after liver biopsy

## 2019-07-12 ENCOUNTER — Telehealth: Payer: Self-pay | Admitting: Gastroenterology

## 2019-07-12 ENCOUNTER — Other Ambulatory Visit: Payer: Self-pay

## 2019-07-12 ENCOUNTER — Other Ambulatory Visit: Payer: Self-pay | Admitting: Internal Medicine

## 2019-07-12 DIAGNOSIS — Z20822 Contact with and (suspected) exposure to covid-19: Secondary | ICD-10-CM

## 2019-07-12 DIAGNOSIS — R945 Abnormal results of liver function studies: Secondary | ICD-10-CM

## 2019-07-12 NOTE — Telephone Encounter (Signed)
Santiago Glad with the MRI Dept called & states she has a question on the order. Please call to clarify.

## 2019-07-12 NOTE — Telephone Encounter (Signed)
Spoke with Santiago Glad in [place] regarding pt MRI order. Pt MRI order has been changed to exclude the IV contrast.

## 2019-07-14 ENCOUNTER — Other Ambulatory Visit: Payer: Self-pay

## 2019-07-14 ENCOUNTER — Ambulatory Visit
Admission: RE | Admit: 2019-07-14 | Discharge: 2019-07-14 | Disposition: A | Discharge status: Home / Self Care | Payer: Managed Care, Other (non HMO) | Source: Ambulatory Visit | Attending: Gastroenterology | Admitting: Gastroenterology

## 2019-07-14 DIAGNOSIS — R945 Abnormal results of liver function studies: Secondary | ICD-10-CM

## 2019-07-14 LAB — NOVEL CORONAVIRUS, NAA: SARS-CoV-2, NAA: NOT DETECTED

## 2019-07-14 MED ORDER — PREDNISONE 50 MG PO TABS: ORAL_TABLET | ORAL | 0 refills | Status: DC

## 2019-07-19 ENCOUNTER — Other Ambulatory Visit: Payer: Self-pay | Admitting: Gastroenterology

## 2019-07-19 ENCOUNTER — Other Ambulatory Visit: Payer: Self-pay

## 2019-07-19 ENCOUNTER — Ambulatory Visit
Admission: RE | Admit: 2019-07-19 | Discharge: 2019-07-19 | Disposition: A | Discharge status: Home / Self Care | Payer: Managed Care, Other (non HMO) | Source: Ambulatory Visit | Attending: Gastroenterology | Admitting: Gastroenterology

## 2019-07-19 DIAGNOSIS — R945 Abnormal results of liver function studies: Secondary | ICD-10-CM | POA: Diagnosis not present

## 2019-07-19 LAB — POCT I-STAT CREATININE: Creatinine, Ser: 0.6 mg/dL (ref 0.44–1.00)

## 2019-07-19 MED ORDER — GADOBUTROL 1 MMOL/ML IV SOLN
7.0000 mL | Freq: Once | INTRAVENOUS | Status: AC | PRN
  Administered 2019-07-19: 7 mL via INTRAVENOUS

## 2019-07-20 ENCOUNTER — Encounter: Payer: Self-pay | Admitting: Gastroenterology

## 2019-07-24 ENCOUNTER — Ambulatory Visit: Admit: 2019-07-24 | Payer: Managed Care, Other (non HMO) | Admitting: Gastroenterology

## 2019-07-24 ENCOUNTER — Telehealth: Payer: Self-pay

## 2019-07-24 SURGERY — COLONOSCOPY WITH PROPOFOL
Anesthesia: General

## 2019-07-24 NOTE — Telephone Encounter (Signed)
Spoke with pt and informed her of MRI results and Dr. Georgeann Oppenheim recommendations. Pt states she has questions regarding the plan for the Liver biopsy I offered pt a visit with Dr. Vicente Males to discuss, she agrees.

## 2019-07-24 NOTE — Telephone Encounter (Signed)
-----   Message from Jonathon Bellows, MD sent at 07/20/2019 12:47 PM EDT ----- Please inform MRI showed no abnormalities.  The next step would be to obtain a liver biopsy since the MRI showed no significant abnormality.  If she is willing to go ahead please schedule an ultrasound-guided liver biopsy if she would like to discuss this further please feel free to offer an office visit.

## 2019-07-27 ENCOUNTER — Ambulatory Visit: Payer: Managed Care, Other (non HMO) | Admitting: Gastroenterology

## 2019-07-29 IMAGING — US ULTRASOUND ABDOMEN LIMITED
1 series · 13 of 25 positions shown · non-contrast
Comparison: None.

CLINICAL DATA: Elevated liver enzymes

EXAM:
ULTRASOUND ABDOMEN LIMITED RIGHT UPPER QUADRANT

[Series 1: ultrasound abdomen limited · 0.26mm/px · 13 of 50 slices shown]
[im 1/50]
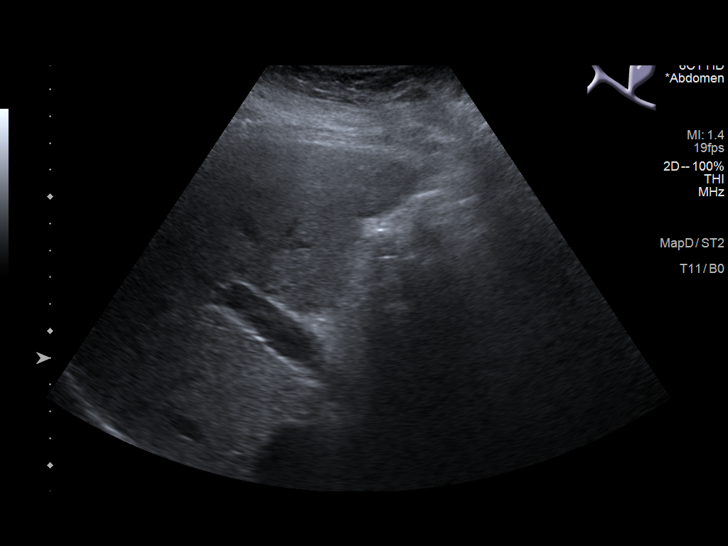
[im 5/50]
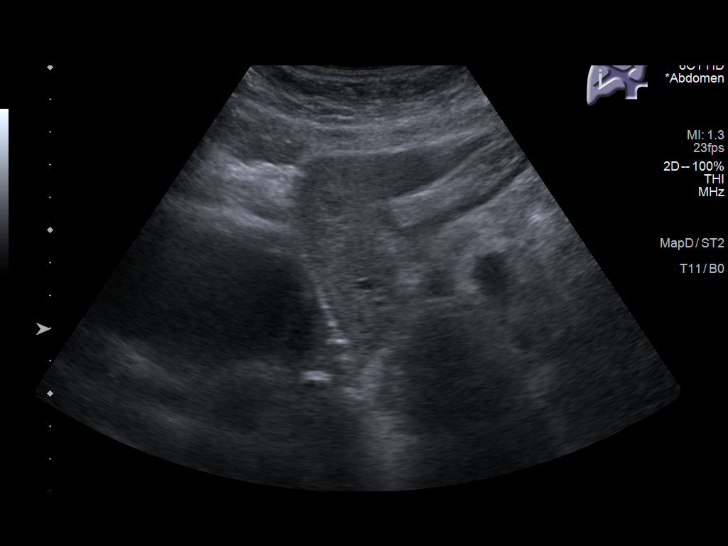
[im 9/50]
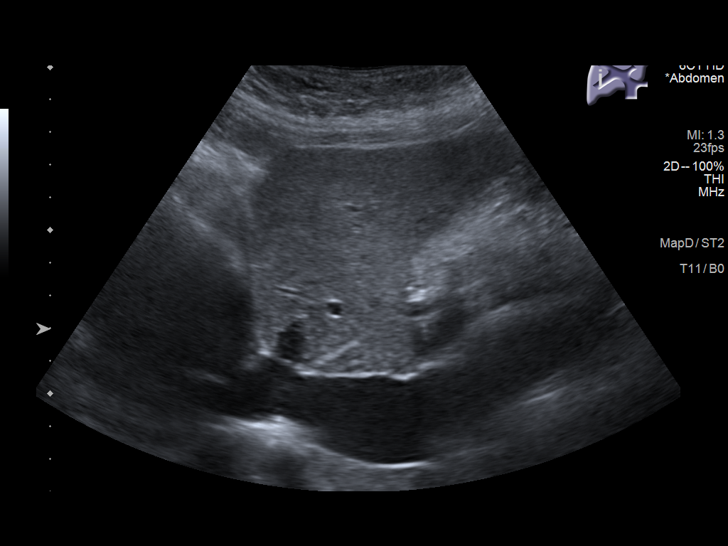
[im 13/50]
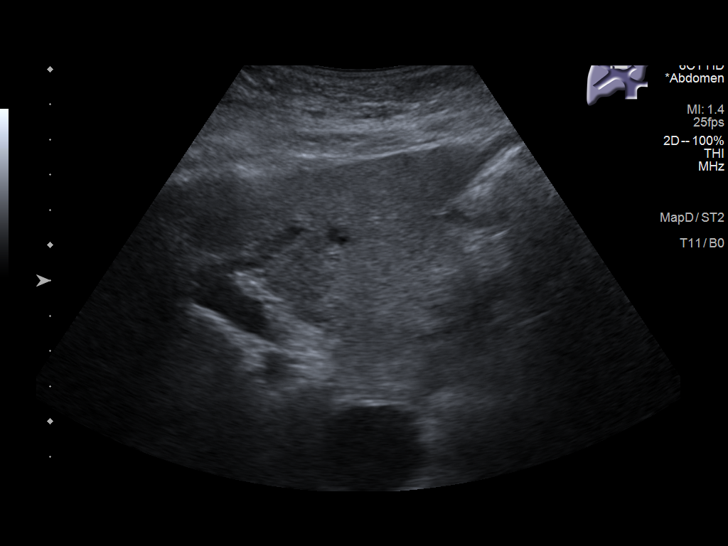
[im 17/50]
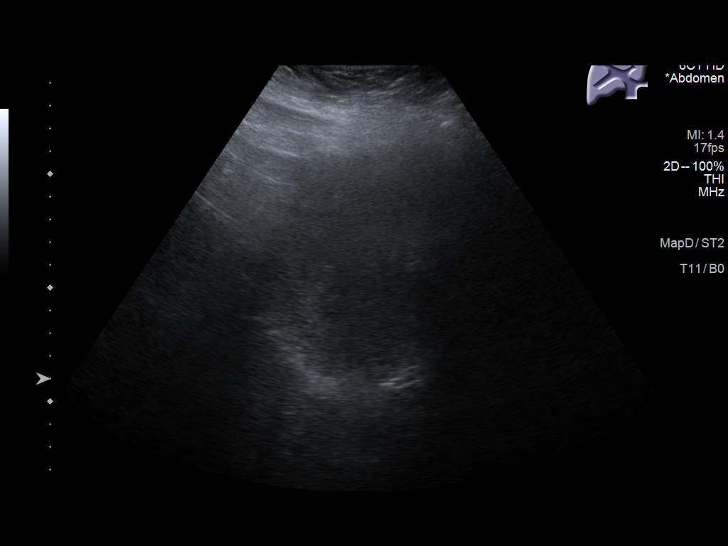
[im 21/50]
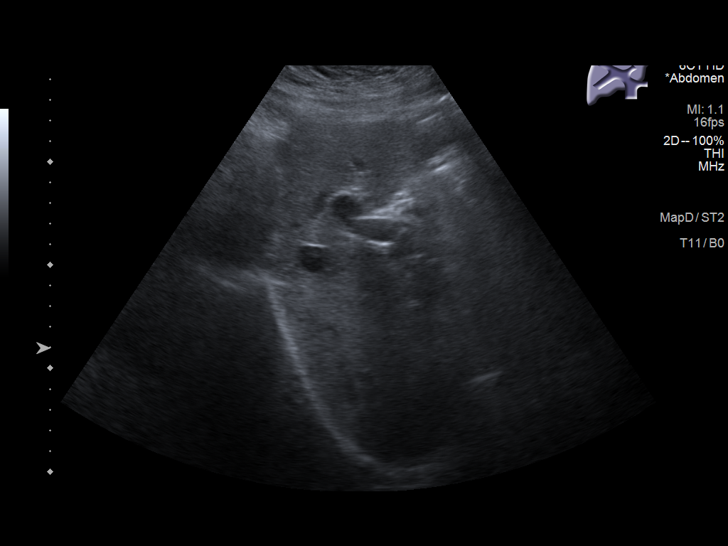
[im 25/50]
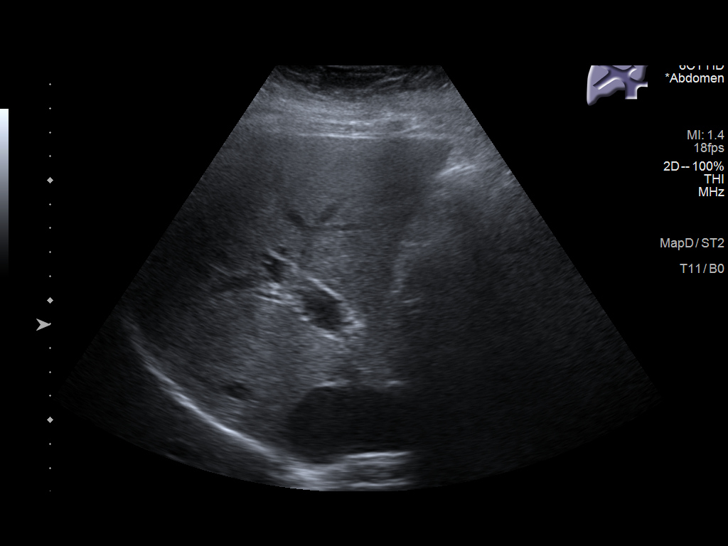
[im 29/50]
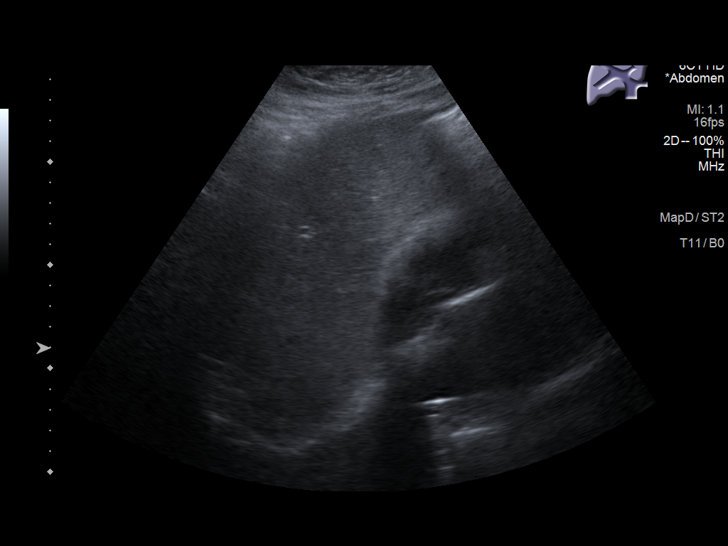
[im 33/50]
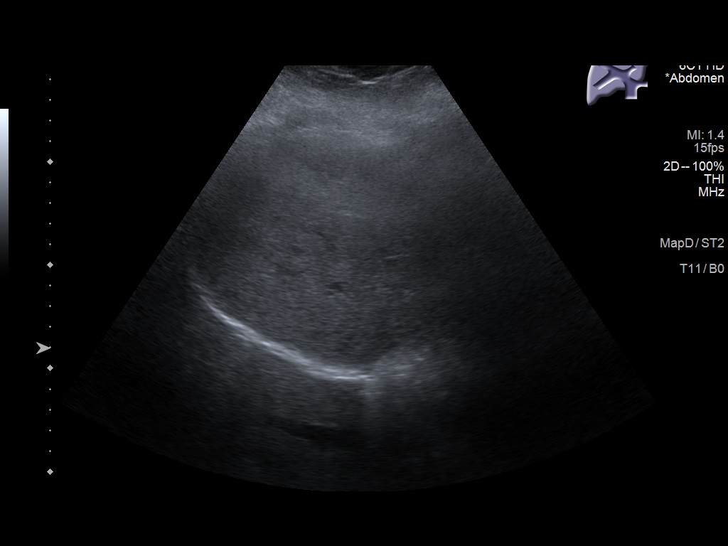
[im 37/50]
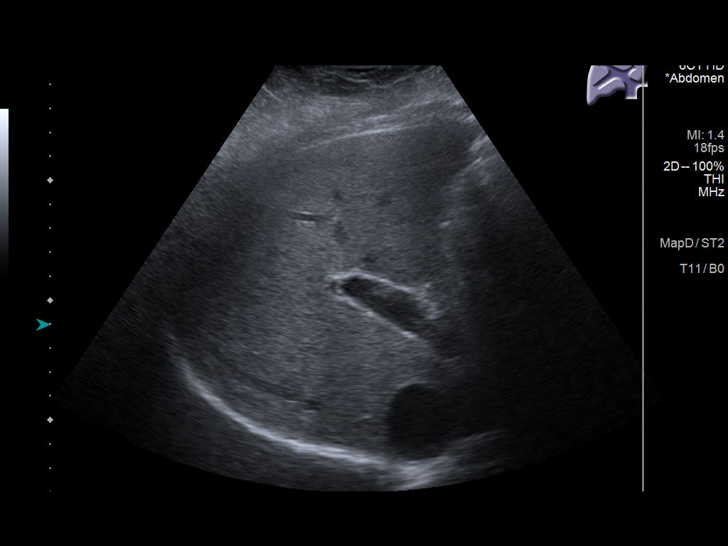
[im 41/50]
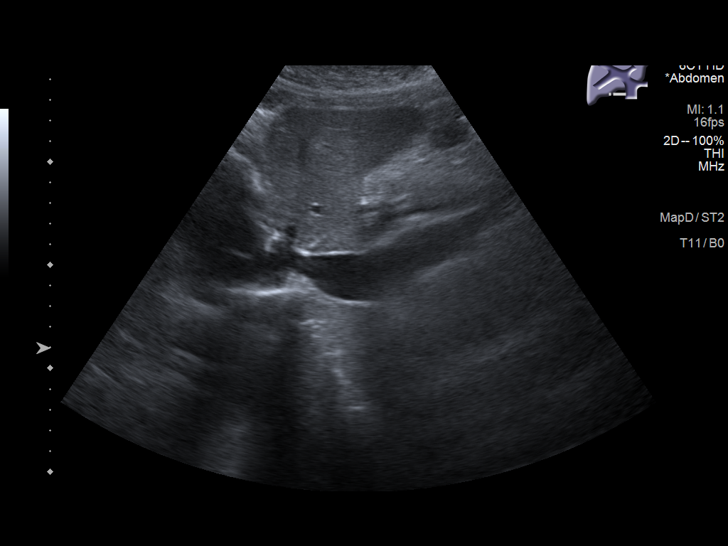
[im 45/50]
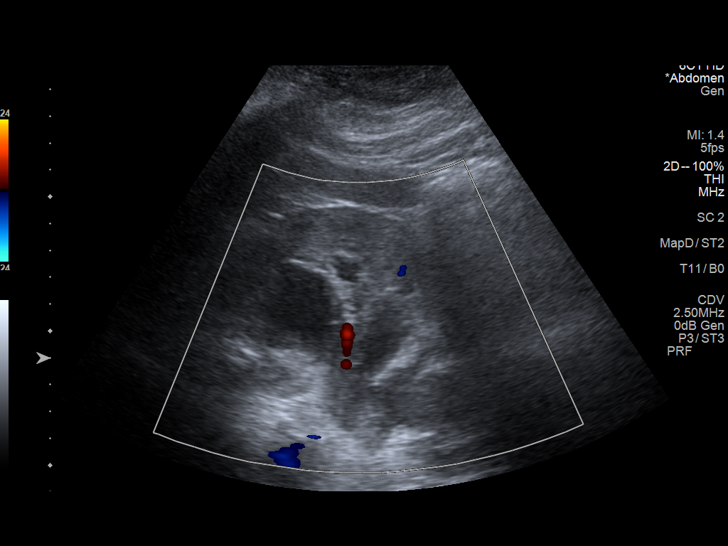
[im 50/50]
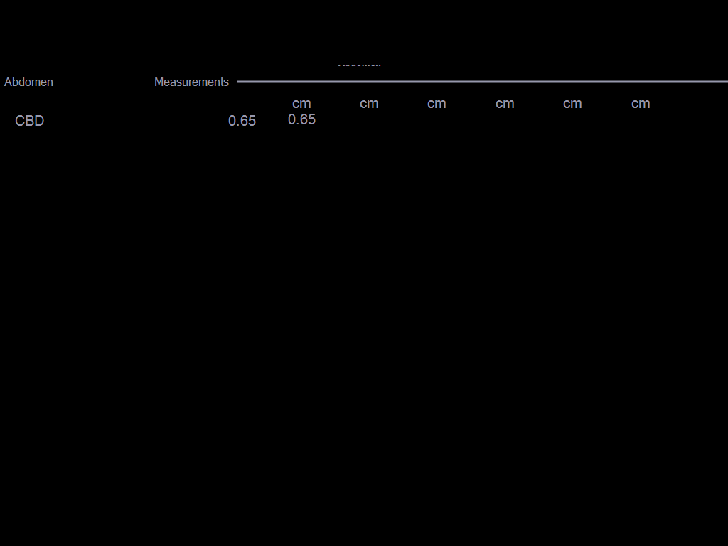

[13 of 25 positions shown; findings below may reference images not displayed]

FINDINGS: Gallbladder:

Surgically absent.

Common bile duct:

Diameter: 7 mm. No intrahepatic or extrahepatic biliary duct
dilatation.

Liver:

No focal lesion identified. Liver echogenicity is increased
diffusely. Portal vein is patent on color Doppler imaging with
normal direction of blood flow towards the liver.

There is noted to be hydronephrosis and proximal ureterectasis on
the right without obstructing focus evident.
IMPRESSION: 1. Increase in liver echogenicity, a finding felt to be indicative
of hepatic steatosis. While no focal liver lesions are evident on
this study, it must be cautioned that the sensitivity of ultrasound
for detection of focal liver lesions is diminished in this
circumstance.

2. Right renal hydronephrosis and proximal ureterectasis. No
obstructing focus evident. Possibility of a ureteral calculus more
distally must be of concern given this finding. Note that the
patient had hydronephrosis and calculi on prior CT. It may be
prudent to consider noncontrast CT abdomen and pelvis to assess for
potential obstructing lesion involving the right ureter.

3.  Gallbladder absent.

These results will be called to the ordering clinician or
representative by the Radiologist Assistant, and communication
documented in the PACS or zVision Dashboard.

## 2019-07-31 ENCOUNTER — Telehealth: Payer: Self-pay

## 2019-07-31 NOTE — Telephone Encounter (Signed)
Spoke with pt to enquire if she wants to reschedule visit with Dr. Vicente Males as she was scheduled for a televisit last week but cancelled. Pt states she wants to hold off on the liver biopsy for now due to personal/family reasons. She did not want to schedule a future follow up visit, she plans to contact our office when she's ready. Information has been passed to Dr. Vicente Males.

## 2019-07-31 NOTE — Telephone Encounter (Signed)
-----   Message from Jonathon Bellows, MD sent at 07/30/2019 11:01 AM EDT ----- Sherald Hess Can you check if she has an office visit to discuss about need for a liver biopsy.  C/c [name], FNP   Dr Jonathon Bellows MD,MRCP [facility]) Gastroenterology/Hepatology Pager: [phone]

## 2019-08-07 ENCOUNTER — Other Ambulatory Visit: Payer: Self-pay

## 2019-08-07 DIAGNOSIS — Z20822 Contact with and (suspected) exposure to covid-19: Secondary | ICD-10-CM

## 2019-08-08 LAB — NOVEL CORONAVIRUS, NAA: SARS-CoV-2, NAA: NOT DETECTED

## 2019-08-16 ENCOUNTER — Telehealth: Payer: Self-pay | Admitting: Family

## 2019-08-16 NOTE — Telephone Encounter (Signed)
Called patient to set up an appointment with [facility] for knee and arm joint pain. Pt was tested for covid within the 21 days, for possible exposure. After patient was informed that she could not come into office and her appointment would have to be a virtual she told me to cancel the appointment she wasn't doing that. Patient was told if she changed her mind to please call office back.

## 2019-08-21 ENCOUNTER — Ambulatory Visit: Payer: Managed Care, Other (non HMO) | Admitting: Family Medicine

## 2019-08-22 ENCOUNTER — Other Ambulatory Visit: Payer: Self-pay

## 2019-08-22 DIAGNOSIS — Z1211 Encounter for screening for malignant neoplasm of colon: Secondary | ICD-10-CM

## 2019-08-22 DIAGNOSIS — R7989 Other specified abnormal findings of blood chemistry: Secondary | ICD-10-CM

## 2019-08-22 DIAGNOSIS — R945 Abnormal results of liver function studies: Secondary | ICD-10-CM

## 2019-08-22 MED ORDER — NA SULFATE-K SULFATE-MG SULF 17.5-3.13-1.6 GM/177ML PO SOLN: 1.0000 | Freq: Once | ORAL | 0 refills | Status: AC

## 2019-08-29 ENCOUNTER — Telehealth: Payer: Self-pay

## 2019-08-29 NOTE — Telephone Encounter (Signed)
Called pt to inform her of liver biopsy appointment information.  Unable to contact, left detailed VM.

## 2019-08-30 ENCOUNTER — Ambulatory Visit: Payer: Managed Care, Other (non HMO)

## 2019-09-04 ENCOUNTER — Ambulatory Visit: Payer: Managed Care, Other (non HMO) | Admitting: Gastroenterology

## 2019-09-07 ENCOUNTER — Other Ambulatory Visit
Admission: RE | Admit: 2019-09-07 | Discharge: 2019-09-07 | Disposition: A | Discharge status: Home / Self Care | Payer: Managed Care, Other (non HMO) | Source: Ambulatory Visit | Attending: Gastroenterology | Admitting: Gastroenterology

## 2019-09-07 DIAGNOSIS — Z20828 Contact with and (suspected) exposure to other viral communicable diseases: Secondary | ICD-10-CM | POA: Insufficient documentation

## 2019-09-07 DIAGNOSIS — Z01812 Encounter for preprocedural laboratory examination: Secondary | ICD-10-CM | POA: Diagnosis not present

## 2019-09-07 LAB — SARS CORONAVIRUS 2 (TAT 6-24 HRS): SARS Coronavirus 2: NEGATIVE

## 2019-09-11 ENCOUNTER — Ambulatory Visit: Payer: Managed Care, Other (non HMO) | Admitting: Certified Registered Nurse Anesthetist

## 2019-09-11 ENCOUNTER — Encounter: Payer: Self-pay | Admitting: *Deleted

## 2019-09-11 ENCOUNTER — Other Ambulatory Visit: Payer: Self-pay

## 2019-09-11 ENCOUNTER — Encounter
Admission: RE | Disposition: A | Discharge status: Home / Self Care | Payer: Self-pay | Source: Home / Self Care | Attending: Gastroenterology

## 2019-09-11 ENCOUNTER — Ambulatory Visit
Admission: RE | Admit: 2019-09-11 | Discharge: 2019-09-11 | Disposition: A | Discharge status: Home / Self Care | Payer: Managed Care, Other (non HMO) | Attending: Gastroenterology | Admitting: Gastroenterology

## 2019-09-11 DIAGNOSIS — Z8371 Family history of colonic polyps: Secondary | ICD-10-CM | POA: Diagnosis not present

## 2019-09-11 DIAGNOSIS — Z1211 Encounter for screening for malignant neoplasm of colon: Secondary | ICD-10-CM | POA: Insufficient documentation

## 2019-09-11 DIAGNOSIS — Z886 Allergy status to analgesic agent status: Secondary | ICD-10-CM | POA: Insufficient documentation

## 2019-09-11 DIAGNOSIS — E785 Hyperlipidemia, unspecified: Secondary | ICD-10-CM | POA: Diagnosis not present

## 2019-09-11 DIAGNOSIS — Z9884 Bariatric surgery status: Secondary | ICD-10-CM | POA: Insufficient documentation

## 2019-09-11 DIAGNOSIS — Z800 Family history of malignant neoplasm of digestive organs: Secondary | ICD-10-CM | POA: Diagnosis not present

## 2019-09-11 DIAGNOSIS — Z888 Allergy status to other drugs, medicaments and biological substances status: Secondary | ICD-10-CM | POA: Insufficient documentation

## 2019-09-11 DIAGNOSIS — J45909 Unspecified asthma, uncomplicated: Secondary | ICD-10-CM | POA: Insufficient documentation

## 2019-09-11 HISTORY — PX: COLONOSCOPY WITH PROPOFOL: SHX5780

## 2019-09-11 SURGERY — COLONOSCOPY WITH PROPOFOL
Anesthesia: General

## 2019-09-11 MED ORDER — SODIUM CHLORIDE 0.9 % IV SOLN
INTRAVENOUS | Status: DC
  Administered 2019-09-11: 1000 mL via INTRAVENOUS

## 2019-09-11 MED ORDER — LIDOCAINE HCL (PF) 2 % IJ SOLN
INTRAMUSCULAR | Status: AC
  Filled 2019-09-11: qty 20

## 2019-09-11 MED ORDER — PROPOFOL 500 MG/50ML IV EMUL
INTRAVENOUS | Status: DC | PRN
  Administered 2019-09-11: 160 ug/kg/min via INTRAVENOUS

## 2019-09-11 MED ORDER — GLYCOPYRROLATE 0.2 MG/ML IJ SOLN
INTRAMUSCULAR | Status: AC
  Filled 2019-09-11: qty 1

## 2019-09-11 MED ORDER — PHENYLEPHRINE HCL (PRESSORS) 10 MG/ML IV SOLN
INTRAVENOUS | Status: AC
  Filled 2019-09-11: qty 1

## 2019-09-11 MED ORDER — PROPOFOL 500 MG/50ML IV EMUL
INTRAVENOUS | Status: AC
  Filled 2019-09-11: qty 200

## 2019-09-11 MED ORDER — PROPOFOL 10 MG/ML IV BOLUS
INTRAVENOUS | Status: DC | PRN
  Administered 2019-09-11: 60 mg via INTRAVENOUS

## 2019-09-11 MED ORDER — SUCCINYLCHOLINE CHLORIDE 20 MG/ML IJ SOLN
INTRAMUSCULAR | Status: AC
  Filled 2019-09-11: qty 1

## 2019-09-11 NOTE — H&P (Signed)
Jonathon Bellows, MD [address], [place], [place], [place], [address] [address], [place], [place], [place], [address] Phone: [phone]  Fax: [phone]  Primary Care Physician:  [name], FNP   Pre-Procedure History & Physical: HPI:  [name] is a 53 y.o. female is here for an colonoscopy.   Past Medical History:  Diagnosis Date  . Asthma due to seasonal allergies    no inhaler  . History of diabetes mellitus, type II    per pt resolved after bariatric surgery 2017  . History of hypertension    per pt resolved after bariatric surgery 2017  . History of kidney stones   . History of obstructive sleep apnea    per pt hx osa w/ cpap,  retested after bariatric surgery 2017 result negative  . History of skin cancer    nose, not melanoma  . Hydronephrosis, right   . Hyperlipidemia   . MRSA carrier   . NAFL (nonalcoholic fatty liver)   . Osteoporosis   . Renal calculi    per CT 04-18-2019 bilateral , non-obstructive  . S/P bariatric surgery 08/03/2016   duodenal switch w/ hh repair  . Ureteral calculus, right   . Wears glasses     Past Surgical History:  Procedure Laterality Date  . BARIATRIC SURGERY  08-03-2016    dr Darnell Level @[facility] ,  [place]   LAPAROSCOPY DUODENAL SWITCH AND HIATAL HERNIA REPAIR  . BREAST BIOPSY Right 2017   FIBROCYSTIC CHANGES WITH CALCIFICATIONS  . CESAREAN SECTION  x2  last one 09-10-2000  . CYSTOSCOPY WITH RETROGRADE PYELOGRAM, URETEROSCOPY AND STENT PLACEMENT Right 02-07-2012  @[facility]   . CYSTOSCOPY WITH STENT PLACEMENT Right 09/10/2017   Procedure: CYSTOSCOPY WITH STENT PLACEMENT;  Surgeon: Abbie Sons, MD;  Location: [facility];  Service: Urology;  Laterality: Right;  . CYSTOSCOPY/URETEROSCOPY/HOLMIUM LASER/STENT PLACEMENT Right 04/24/2019   Procedure: CYSTOSCOPY/URETEROSCOPY/STENT PLACEMENT/ RIGHT RETROGRADE;  Surgeon: Festus Aloe, MD;  Location: [facility];  Service: Urology;  Laterality: Right;   . DX LAPAROSCOPY/  EGD/  HIATAL HERNIA REPAIR CLOSURE OF INTERNAL MENSENTRY DEFECT  10-28-2017   @[facility] ,  [place]  . EXTRACORPOREAL SHOCK WAVE LITHOTRIPSY N/A 01/30/2019   Procedure: EXTRACORPOREAL SHOCK WAVE LITHOTRIPSY (ESWL) LEFT/ POSSIBLE RIGHT;  Surgeon: Festus Aloe, MD;  Location: [facility];  Service: Urology;  Laterality: N/A;  . EXTRACORPOREAL SHOCK WAVE LITHOTRIPSY Right 03/20/2019   Procedure: EXTRACORPOREAL SHOCK WAVE LITHOTRIPSY (ESWL);  Surgeon: Franchot Gallo, MD;  Location: [facility];  Service: Urology;  Laterality: Right;  . LAPAROSCOPIC CHOLECYSTECTOMY  10-15-2016   @[facility]  ,  [place]  . TONSILLECTOMY AND ADENOIDECTOMY  child  . URETEROSCOPY WITH HOLMIUM LASER LITHOTRIPSY Right 09/10/2017   Procedure: URETEROSCOPY WITH HOLMIUM LASER LITHOTRIPSY;  Surgeon: Abbie Sons, MD;  Location: [facility];  Service: Urology;  Laterality: Right;    Prior to Admission medications   Medication Sig Start Date End Date Taking? Authorizing Provider  OVER THE COUNTER MEDICATION     [provider]  predniSONE (DELTASONE) 50 MG tablet Take 1 tablet 13 hours, 7 hours, and 1 hour before exam 07/14/19   Jonathon Bellows, MD    Allergies as of 08/22/2019 - Review Complete 07/19/2019  Allergen Reaction Noted  . Latex Shortness Of Breath and Cough 07/10/2016  . Tolmetin Hives 01/11/2014  . Levemir [insulin detemir] Hives 02/26/2016  . Nsaids Hives 01/11/2014  . Gadolinium derivatives Hives 03/15/2019    Family History  Problem Relation Age of Onset  .  Alcohol abuse Mother   . Hyperlipidemia Mother   . Hypertension Mother   . Lymphoma Mother        primary site : brain  . Hyperlipidemia Father   . Hypertension Father   . Parkinson's disease Father   . Drug abuse Sister   . Cancer Maternal Grandmother        breast cancer  . Breast cancer Maternal Grandmother   . Cancer Paternal Grandmother   . Kidney cancer Neg Hx   . Prostate cancer Neg Hx     Social History   Socioeconomic  History  . Marital status: Single    Spouse name: Not on file  . Number of children: 2  . Years of education: 63  . Highest education level: Not on file  Occupational History  . Occupation: Mudlogger: [employer]  . 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 Occupation: Engineer, materials  . 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 Financial resource strain: Not on file  . Food insecurity    Worry: Not on file    Inability: Not on file  . Transportation needs    Medical: Not on file    Non-medical: Not on file  Tobacco Use  . Smoking status: Never Smoker  . Smokeless tobacco: Never Used  Substance and Sexual Activity  . Alcohol use: No    Alcohol/week: 0.0 standard drinks  . Drug use: Never  . Sexual activity: Yes    Birth control/protection: None, Post-menopausal  Lifestyle  . Physical activity    Days per week: Not on file    Minutes per session: Not on file  . Stress: Not on file  Relationships  . Social Herbalist on phone: Not on file    Gets together: Not on file    Attends religious service: Not on file    Active member of club or organization: Not on file    Attends meetings of clubs or organizations: Not on file    Relationship status: Not on file  . Intimate partner violence    Fear of current or ex partner: Not on file    Emotionally abused: Not on file    Physically abused: Not on file    Forced sexual activity: Not on file  Other Topics Concern  . Not on file  Social History Narrative   [name] grew up partly in [place] and then [place]. She lives in [place] with her two daughter. 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 She enjoys the outdoors, gardening.     Review of Systems: See HPI, otherwise negative ROS  Physical Exam: BP (!) 180/88   Pulse 61   Temp (!) 97.2 F (36.2 C) (Tympanic)   Resp 16   Ht 5\' 8"  (1.727 m)    Wt 78.5 kg   LMP 07/11/2016 (Exact Date)   SpO2 100%   BMI 26.30 kg/m  General:   Alert,  pleasant and cooperative in NAD Head:  Normocephalic and atraumatic. Neck:  Supple; no masses or thyromegaly. Lungs:  Clear throughout to auscultation, normal respiratory effort.    Heart:  +S1, +S2, Regular rate and rhythm, No edema. Abdomen:  Soft, nontender and nondistended. Normal bowel sounds, without guarding, and without rebound.   Neurologic:  Alert and  oriented x4;  grossly normal neurologically.  Impression/Plan: Estefanie Brenizer is  here for an colonoscopy to be performed for Screening colonoscopy, mother had colon polyps  Risks, benefits, limitations, and alternatives regarding  colonoscopy have been reviewed with the patient.  Questions have been answered.  All parties agreeable.   Jonathon Bellows, MD  09/11/2019, 8:06 AM

## 2019-09-11 NOTE — Anesthesia Preprocedure Evaluation (Addendum)
Anesthesia Evaluation  Patient identified by MRN, date of birth, ID band Patient awake    Reviewed: Allergy & Precautions, H&P , NPO status , Patient's Chart, lab work & pertinent test results  Airway Mallampati: II  TM Distance: >3 FB     Dental  (+) Teeth Intact   Pulmonary asthma , Sleep apnea: resolved after gastric bypass. ,           Cardiovascular Hypertension: resolved after gastric bypass.      Neuro/Psych PSYCHIATRIC DISORDERS Depression negative neurological ROS     GI/Hepatic NAFLD S/p gastric bypass 2017   Endo/Other  Diabetes: resolved after gastric bypass.  Renal/GU negative Renal ROS  negative genitourinary   Musculoskeletal   Abdominal   Peds  Hematology negative hematology ROS (+)   Anesthesia Other Findings Past Medical History: No date: Asthma due to seasonal allergies     Comment:  no inhaler No date: History of diabetes mellitus, type II     Comment:  per pt resolved after bariatric surgery 2017 No date: History of hypertension     Comment:  per pt resolved after bariatric surgery 2017 No date: History of kidney stones No date: History of obstructive sleep apnea     Comment:  per pt hx osa w/ cpap,  retested after bariatric surgery              2017 result negative No date: History of skin cancer     Comment:  nose, not melanoma No date: Hydronephrosis, right No date: Hyperlipidemia No date: MRSA carrier No date: NAFL (nonalcoholic fatty liver) No date: Osteoporosis No date: Renal calculi     Comment:  per CT 04-18-2019 bilateral , non-obstructive 08/03/2016: S/P bariatric surgery     Comment:  duodenal switch w/ hh repair No date: Ureteral calculus, right No date: Wears glasses  Past Surgical History: 08-03-2016    dr Darnell Level @[facility] ,  [place]: BARIATRIC SURGERY     Comment:  LAPAROSCOPY DUODENAL SWITCH AND HIATAL HERNIA REPAIR 2017: BREAST BIOPSY; Right     Comment:  FIBROCYSTIC  CHANGES WITH CALCIFICATIONS x2  last one 09-10-2000: CESAREAN SECTION 02-07-2012  @[facility] : CYSTOSCOPY WITH RETROGRADE PYELOGRAM, URETEROSCOPY  AND STENT PLACEMENT; Right 09/10/2017: CYSTOSCOPY WITH STENT PLACEMENT; Right     Comment:  Procedure: CYSTOSCOPY WITH STENT PLACEMENT;  Surgeon:               Abbie Sons, MD;  Location: [facility];  Service:               Urology;  Laterality: Right; 04/24/2019: CYSTOSCOPY/URETEROSCOPY/HOLMIUM LASER/STENT PLACEMENT;  Right     Comment:  Procedure: CYSTOSCOPY/URETEROSCOPY/STENT PLACEMENT/               RIGHT RETROGRADE;  Surgeon: Festus Aloe, MD;                Location: [facility];  Service: Urology;               Laterality: Right; 10-28-2017   @[facility] ,  [place]: DX LAPAROSCOPY/  EGD/  HIATAL HERNIA  REPAIR CLOSURE OF INTERNAL MENSENTRY DEFECT 01/30/2019: EXTRACORPOREAL SHOCK WAVE LITHOTRIPSY; N/A     Comment:  Procedure: EXTRACORPOREAL SHOCK WAVE LITHOTRIPSY (ESWL)               LEFT/ POSSIBLE RIGHT;  Surgeon: Festus Aloe, MD;                Location: [facility];  Service: Urology;  Laterality: N/A;  03/20/2019: EXTRACORPOREAL SHOCK WAVE LITHOTRIPSY; Right     Comment:  Procedure: EXTRACORPOREAL SHOCK WAVE LITHOTRIPSY (ESWL);              Surgeon: Franchot Gallo, MD;  Location: [facility];                Service: Urology;  Laterality: Right; 10-15-2016   @[facility]  ,  [place]: LAPAROSCOPIC CHOLECYSTECTOMY child: TONSILLECTOMY AND ADENOIDECTOMY 09/10/2017: URETEROSCOPY WITH HOLMIUM LASER LITHOTRIPSY; Right     Comment:  Procedure: URETEROSCOPY WITH HOLMIUM LASER LITHOTRIPSY;               Surgeon: Abbie Sons, MD;  Location: [facility];                Service: Urology;  Laterality: Right;     Reproductive/Obstetrics negative OB ROS                            Anesthesia Physical Anesthesia Plan  ASA: III  Anesthesia Plan: General   Post-op Pain Management:    Induction:   PONV Risk Score  and Plan: Propofol infusion and TIVA  Airway Management Planned: Natural Airway and Simple Face Mask  Additional Equipment:   Intra-op Plan:   Post-operative Plan:   Informed Consent: I have reviewed the patients History and Physical, chart, labs and discussed the procedure including the risks, benefits and alternatives for the proposed anesthesia with the patient or authorized representative who has indicated his/her understanding and acceptance.     Dental Advisory Given  Plan Discussed with: Anesthesiologist, CRNA and Surgeon  Anesthesia Plan Comments:         Anesthesia Quick Evaluation

## 2019-09-11 NOTE — Transfer of Care (Signed)
Immediate Anesthesia Transfer of Care Note  Patient: [name]  Procedure(s) Performed: COLONOSCOPY WITH PROPOFOL (N/A )  Patient Location: PACU  Anesthesia Type:General  Level of Consciousness: drowsy  Airway & Oxygen Therapy: Patient Spontanous Breathing and Patient connected to nasal cannula oxygen  Post-op Assessment: Report given to RN and Post -op Vital signs reviewed and stable  Post vital signs: Reviewed and stable  Last Vitals:  Vitals Value Taken Time  BP 109/65 09/11/19 0831  Temp 36.1 C 09/11/19 0830  Pulse 58 09/11/19 0833  Resp 16 09/11/19 0833  SpO2 100 % 09/11/19 0833  Vitals shown include unvalidated device data.  Last Pain:  Vitals:   09/11/19 0830  TempSrc: Tympanic  PainSc: 0-No pain         Complications: No apparent anesthesia complications

## 2019-09-11 NOTE — Anesthesia Postprocedure Evaluation (Signed)
Anesthesia Post Note  Patient: [name]  Procedure(s) Performed: COLONOSCOPY WITH PROPOFOL (N/A )  Patient location during evaluation: PACU Anesthesia Type: General Level of consciousness: awake and alert Pain management: pain level controlled Vital Signs Assessment: post-procedure vital signs reviewed and stable Respiratory status: spontaneous breathing, nonlabored ventilation and respiratory function stable Cardiovascular status: blood pressure returned to baseline and stable Postop Assessment: no apparent nausea or vomiting Anesthetic complications: no     Last Vitals:  Vitals:   09/11/19 0719 09/11/19 0830  BP: (!) 180/88 109/65  Pulse: 61 64  Resp: 16 19  Temp: (!) 36.2 C (!) 36.1 C  SpO2: 100% 100%    Last Pain:  Vitals:   09/11/19 0830  TempSrc: Tympanic  PainSc: 0-No pain                 Durenda Hurt

## 2019-09-11 NOTE — Anesthesia Post-op Follow-up Note (Signed)
Anesthesia QCDR form completed.        

## 2019-09-11 NOTE — Op Note (Signed)
[facility] Patient Name: [name] Procedure Date: 09/11/2019 7:54 AM MRN: [id number] Account #: [id number] Date of Birth: 09-01-1966 Admit Type: Outpatient Age: 53 Room: [facility] ENDO ROOM 4 Gender: Female Note Status: Finalized Procedure:            Colonoscopy Indications:          Colon cancer screening in patient at increased risk:                        Family history of 1st-degree relative with colon polyps Providers:            Jonathon Bellows MD, MD Referring MD:         Yvetta Coder. [name] (Referring MD) Medicines:            Monitored Anesthesia Care Complications:        No immediate complications. Procedure:            Pre-Anesthesia Assessment:                       - Prior to the procedure, a History and Physical was                        performed, and patient medications, allergies and                        sensitivities were reviewed. The patient's tolerance of                        previous anesthesia was reviewed.                       - The risks and benefits of the procedure and the                        sedation options and risks were discussed with the                        patient. All questions were answered and informed                        consent was obtained.                       - ASA Grade Assessment: II - A patient with mild                        systemic disease.                       After obtaining informed consent, the colonoscope was                        passed under direct vision. Throughout the procedure,                        the patient's blood pressure, pulse, and oxygen                        saturations were monitored continuously. The  Colonoscope was introduced through the anus and                        advanced to the the cecum, identified by the                        appendiceal orifice. The colonoscopy was performed with                        ease. The patient  tolerated the procedure well. The                        quality of the bowel preparation was good. Findings:      The perianal and digital rectal examinations were normal.      The entire examined colon appeared normal on direct and retroflexion       views. Impression:           - The entire examined colon is normal on direct and                        retroflexion views.                       - No specimens collected. Recommendation:       - Discharge patient to home (with escort).                       - Resume previous diet.                       - Continue present medications.                       - Repeat colonoscopy in 5 years for screening purposes.                       - Return to my office as previously scheduled. Procedure Code(s):    --- Professional ---                       [phone], Colonoscopy, flexible; diagnostic, including                        collection of specimen(s) by brushing or washing, when                        performed (separate procedure) Diagnosis Code(s):    --- Professional ---                       Z83.71, Family history of colonic polyps CPT copyright 2019 American Medical Association. All rights reserved. The codes documented in this report are preliminary and upon coder review may  be revised to meet current compliance requirements. Jonathon Bellows, MD Jonathon Bellows MD, MD 09/11/2019 8:28:40 AM This report has been signed electronically. Number of Addenda: 0 Note Initiated On: 09/11/2019 7:54 AM Scope Withdrawal Time: 0 hours 10 minutes 46 seconds  Total Procedure Duration: 0 hours 16 minutes 18 seconds  Estimated Blood Loss: Estimated blood loss: none.      [facility]

## 2019-09-11 NOTE — Anesthesia Procedure Notes (Signed)
Performed by: Seleen Walter, CRNA Pre-anesthesia Checklist: Patient identified, Emergency Drugs available, Suction available, Patient being monitored and Timeout performed Patient Re-evaluated:Patient Re-evaluated prior to induction Oxygen Delivery Method: Nasal cannula Induction Type: IV induction       

## 2019-09-12 ENCOUNTER — Encounter: Payer: Self-pay | Admitting: Gastroenterology

## 2019-09-12 ENCOUNTER — Other Ambulatory Visit: Payer: Self-pay | Admitting: Radiology

## 2019-09-13 ENCOUNTER — Ambulatory Visit
Admission: RE | Admit: 2019-09-13 | Discharge: 2019-09-13 | Disposition: A | Discharge status: Home / Self Care | Payer: Managed Care, Other (non HMO) | Source: Ambulatory Visit | Attending: Gastroenterology | Admitting: Gastroenterology

## 2019-09-13 ENCOUNTER — Other Ambulatory Visit: Payer: Self-pay

## 2019-09-13 DIAGNOSIS — R945 Abnormal results of liver function studies: Secondary | ICD-10-CM

## 2019-09-13 DIAGNOSIS — R7989 Other specified abnormal findings of blood chemistry: Secondary | ICD-10-CM

## 2019-09-13 LAB — CBC
HCT: 36.4 % (ref 36.0–46.0)
Hemoglobin: 11.7 g/dL — ABNORMAL LOW (ref 12.0–15.0)
MCH: 29.5 pg (ref 26.0–34.0)
MCHC: 32.1 g/dL (ref 30.0–36.0)
MCV: 91.7 fL (ref 80.0–100.0)
Platelets: 168 10*3/uL (ref 150–400)
RBC: 3.97 MIL/uL (ref 3.87–5.11)
RDW: 12.2 % (ref 11.5–15.5)
WBC: 5.1 10*3/uL (ref 4.0–10.5)
nRBC: 0 % (ref 0.0–0.2)

## 2019-09-13 LAB — PROTIME-INR
INR: 1.1 (ref 0.8–1.2)
Prothrombin Time: 14.1 seconds (ref 11.4–15.2)

## 2019-09-13 MED ORDER — FENTANYL CITRATE (PF) 100 MCG/2ML IJ SOLN
INTRAMUSCULAR | Status: AC | PRN
  Administered 2019-09-13 (×2): 50 ug via INTRAVENOUS

## 2019-09-13 MED ORDER — HYDROMORPHONE HCL 1 MG/ML IJ SOLN
0.5000 mg | Freq: Once | INTRAMUSCULAR | Status: AC
  Administered 2019-09-13: 12:00:00 0.5 mg via INTRAVENOUS

## 2019-09-13 MED ORDER — HYDROMORPHONE HCL 1 MG/ML IJ SOLN
INTRAMUSCULAR | Status: AC
  Administered 2019-09-13: 0.5 mg via INTRAVENOUS
  Filled 2019-09-13: qty 0.5

## 2019-09-13 MED ORDER — MIDAZOLAM HCL 2 MG/2ML IJ SOLN
INTRAMUSCULAR | Status: AC
  Filled 2019-09-13: qty 2

## 2019-09-13 MED ORDER — MIDAZOLAM HCL 2 MG/2ML IJ SOLN
INTRAMUSCULAR | Status: AC | PRN
  Administered 2019-09-13 (×2): 1 mg via INTRAVENOUS

## 2019-09-13 MED ORDER — SODIUM CHLORIDE 0.9 % IV SOLN
INTRAVENOUS | Status: DC
  Administered 2019-09-13: 11:00:00 via INTRAVENOUS

## 2019-09-13 MED ORDER — FENTANYL CITRATE (PF) 100 MCG/2ML IJ SOLN
INTRAMUSCULAR | Status: AC
  Filled 2019-09-13: qty 2

## 2019-09-13 NOTE — Discharge Instructions (Signed)
Liver Biopsy, Care After °These instructions give you information about how to care for yourself after your procedure. Your health care provider may also give you more specific instructions. If you have problems or questions, contact your health care provider. °What can I expect after the procedure? °After your procedure, it is common to have: °· Pain and soreness in the area where the biopsy was done. °· Bruising around the area where the biopsy was done. °· Sleepiness and fatigue for 1-2 days. °Follow these instructions at home: °Medicines °· Take over-the-counter and prescription medicines only as told by your health care provider. °· If you were prescribed an antibiotic medicine, take it as told by your health care provider. Do not stop taking the antibiotic even if you start to feel better. °· Do not take medicines such as aspirin and ibuprofen unless your health care provider tells you to take them. These medicines thin your blood and can increase the risk of bleeding. °· If you are taking prescription pain medicine, take actions to prevent or treat constipation. Your health care provider may recommend that you: °? Drink enough fluid to keep your urine pale yellow. °? Eat foods that are high in fiber, such as fresh fruits and vegetables, whole grains, and beans. °? Limit foods that are high in fat and processed sugars, such as fried or sweet foods. °? Take an over-the-counter or prescription medicine for constipation. °Incision care °· Follow instructions from your health care provider about how to take care of your incision. Make sure you: °? Wash your hands with soap and water before you change your bandage (dressing). If soap and water are not available, use hand sanitizer. °? Change your dressing as told by your health care provider. °? Leave stitches (sutures), skin glue, or adhesive strips in place. These skin closures may need to stay in place for 2 weeks or longer. If adhesive strip edges start to  loosen and curl up, you may trim the loose edges. Do not remove adhesive strips completely unless your health care provider tells you to do that. °· Check your incision area every day for signs of infection. Check for: °? Redness, swelling, or pain. °? Fluid or blood. °? Warmth. °? Pus or a bad smell. °· Do not take baths, swim, or use a hot tub until your health care provider says it is okay to do so. °Activity ° °· Rest at home for 1-2 days, or as directed by your health care provider. °? Avoid sitting for a long time without moving. Get up to take short walks every 1-2 hours. This is important to improve blood flow and breathing. Ask for help if you feel weak or unsteady. °· Return to your normal activities as told by your health care provider. Ask your health care provider what activities are safe for you. °· Do not drive or use heavy machinery while taking prescription pain medicine. °· Do not lift anything that is heavier than 10 lb (4.5 kg), or the limit that your health care provider tells you, until he or she says that it is safe. °· Do not play contact sports for 2 weeks after the procedure. °General instructions ° °· Do not drink alcohol in the first week after the procedure. °· Have someone stay with you for at least 24 hours after the procedure. °· It is your responsibility to obtain your test results. Ask your health care provider, or the department that is doing the test: °? When will my   results be ready? °? How will I get my results? °? What are my treatment options? °? What other tests do I need? °? What are my next steps? °· Keep all follow-up visits as told by your health care provider. This is important. °Contact a health care provider if: °· You have increased bleeding from an incision, resulting in more than a small spot of blood. °· You have redness, swelling, or increasing pain in any incisions. °· You notice a discharge or a bad smell coming from any of your incisions. °· You have a fever or  chills. °Get help right away if: °· You develop swelling, bloating, or pain in your abdomen. °· You become dizzy or faint. °· You develop a rash. °· You have nausea or you vomit. °· You faint, or you have shortness of breath or difficulty breathing. °· You develop chest pain. °· You have problems with your speech or vision. °· You have trouble with your balance or moving your arms or legs. °Summary °· After the liver biopsy, it is common to have pain, soreness, and bruising in the area, as well as sleepiness and fatigue. °· Take over-the-counter and prescription medicines only as told by your health care provider. °· Follow instructions from your health care provider about how to care for your incision. Check the incision area daily for signs of infection. °This information is not intended to replace advice given to you by your health care provider. Make sure you discuss any questions you have with your health care provider. °Document Released: 05/15/2005 Document Revised: 12/19/2018 Document Reviewed: 11/05/2017 °Elsevier Patient Education © 2020 Elsevier Inc[employer] °Moderate Conscious Sedation, Adult, Care After °These instructions provide you with information about caring for yourself after your procedure. Your health care provider may also give you more specific instructions. Your treatment has been planned according to current medical practices, but problems sometimes occur. Call your health care provider if you have any problems or questions after your procedure. °What can I expect after the procedure? °After your procedure, it is common: °· To feel sleepy for several hours. °· To feel clumsy and have poor balance for several hours. °· To have poor judgment for several hours. °· To vomit if you eat too soon. °Follow these instructions at home: °For at least 24 hours after the procedure: ° °· Do not: °? Participate in activities where you could fall or become injured. °? Drive. °? Use heavy machinery. °? Drink  alcohol. °? Take sleeping pills or medicines that cause drowsiness. °? Make important decisions or sign legal documents. °? Take care of children on your own. °· Rest. °Eating and drinking °· Follow the diet recommended by your health care provider. °· If you vomit: °? Drink water, juice, or soup when you can drink without vomiting. °? Make sure you have little or no nausea before eating solid foods. °General instructions °· Have a responsible adult stay with you until you are awake and alert. °· Take over-the-counter and prescription medicines only as told by your health care provider. °· If you smoke, do not smoke without supervision. °· Keep all follow-up visits as told by your health care provider. This is important. °Contact a health care provider if: °· You keep feeling nauseous or you keep vomiting. °· You feel light-headed. °· You develop a rash. °· You have a fever. °Get help right away if: °· You have trouble breathing. °This information is not intended to replace advice given to you by your health   care provider. Make sure you discuss any questions you have with your health care provider. °Document Released: 08/16/2013 Document Revised: 10/08/2017 Document Reviewed: 02/15/2016 °Elsevier Patient Education © 2020 Elsevier Inc. ° °

## 2019-09-13 NOTE — Progress Notes (Signed)
Dr. Laurence Ferrari informed of patient's increasing abdominal pain without relief from the PRN dilaudid. Orders were to apply heat to the abdomen, ginger ale, and sitting up. Will notify MD if does not improve.

## 2019-09-13 NOTE — Procedures (Signed)
No note

## 2019-09-13 NOTE — Progress Notes (Signed)
Patient clinically stable post LIVER Biopsy per Dr Laurence Ferrari, tolerated well. Dozing throughout procedure, however after complaints mid abdomen discomfort. Vitals stable throughout procedure. Received Versed 2mg  along with Fentanyl 100 mcg IV for procedure.

## 2019-09-14 LAB — SURGICAL PATHOLOGY

## 2019-09-18 ENCOUNTER — Telehealth: Payer: Self-pay

## 2019-09-18 ENCOUNTER — Other Ambulatory Visit: Payer: Self-pay

## 2019-09-18 MED ORDER — URSODIOL NICU ORAL SYRINGE 60 MG/ML: 15.0000 mg/kg/d | Freq: Three times a day (TID) | ORAL | 0 refills | Status: DC

## 2019-09-18 NOTE — Telephone Encounter (Signed)
-----   Message from Jonathon Bellows, MD sent at 09/15/2019 10:14 AM EST ----- Sherald Hess inform bx confirms primary biliary cirrhosis : treat ment is ursodeoxycholic acid Oral: 13 to 15 mg/kg/day in 2 to 4 divided doses (with food).   Schedule office visit to discuss further If willing start her on the meds if not wait till she sees me - schedule her next week or week after   C/c [name], Yvetta Coder, FNP   Dr Jonathon Bellows MD,MRCP [facility]) Gastroenterology/Hepatology Pager: [phone]

## 2019-09-18 NOTE — Telephone Encounter (Signed)
Spoke with pt and informed her of biopsy results and Dr. Georgeann Oppenheim instructions to commence on medication for treatment and to follow up next week. Pt agrees to Dr. Georgeann Oppenheim plan.

## 2019-09-19 ENCOUNTER — Encounter: Payer: Self-pay | Admitting: Gastroenterology

## 2019-09-19 MED ORDER — URSODIOL 250 MG PO TABS: 250.0000 mg | ORAL_TABLET | Freq: Four times a day (QID) | ORAL | 5 refills | Status: AC

## 2019-09-26 ENCOUNTER — Other Ambulatory Visit: Payer: Self-pay

## 2019-09-26 ENCOUNTER — Encounter: Payer: Self-pay | Admitting: Surgery

## 2019-09-26 ENCOUNTER — Ambulatory Visit (INDEPENDENT_AMBULATORY_CARE_PROVIDER_SITE_OTHER): Payer: Managed Care, Other (non HMO) | Admitting: Gastroenterology

## 2019-09-26 VITALS — BP 138/84 | HR 62 | Temp 98.1°F | Ht 68.0 in | Wt 175.8 lb

## 2019-09-26 DIAGNOSIS — K743 Primary biliary cirrhosis: Secondary | ICD-10-CM | POA: Diagnosis not present

## 2019-09-26 NOTE — Progress Notes (Signed)
Jonathon Bellows MD, MRCP(U.K) [address]  [place]  [place], [place] [address]  Main: [phone]  Fax: [phone]   Primary Care Physician: [name], FNP  Primary Gastroenterologist:  Dr. Jonathon Bellows   Follow-up for abnormal liver function test and new diagnosis of primary biliary cholangitis  HPI: [name] is a 53 y.o. female   Summary of history :  [name] is a 53 y.o. female here today to see me to follow-up for abnormal liver function test.  She was initially seen on 06/06/2019.  LFTs were first noted to be abnormal back in 2017.  With transaminases being elevated.  CT abdomen on 04/29/2019 demonstrated no abnormality of the liver.  Right upper quadrant ultrasound demonstrated hepatic steatosis.  In April 2020 parathormone was elevated.  Low vitamin D.  Normal iron studies. She states that her liver function tests have been high since she was in college.  She feels that her daughters to have had abnormal liver function test which she has suggested them to get evaluated for as well.  She complains of pruritus over her arms and feet.  She also have a lot of fatigue but she thinks that due to issues at home.  06/30/2019:GGT normal, Alk phos 171, AST 43, ALT 46.  Vitamin D normal.  Hepatitis A total antibody negative.  Positive antibody for hepatitis B surface antibody.  Hepatitis B surface antigen negative.  Hepatitis C virus antibody, HIV, ANA negative.  Smooth muscle antibody negative but antimitochondrial antibody 115 strongly positive.  Ceruloplasmin 19.9.  Celiac serology negative.  Alpha-1 antitrypsin normal.  Anti-LK M antibody negative.  CK 99.  PTH normal.   Interval history 07/04/2019-09/26/2019  07/19/2019: MRCP: Status post cholecystectomy with minimal extrahepatic biliary dilation at 0.7 cm. 09/11/2019: Colonoscopy: Normal no polyps seen. 09/13/2019: Liver biopsy: Features compatible with primary biliary cholangitis  Commenced on  ursodeoxycholic acid.  She is yet to start it.  She has had blood work performed by her Ambulance person and shows that she has a low vitamin D, low vitamin EE, normal vitamin A.  She is known to have osteoporosis and follows with an endocrinologist who is managing it.  She has had a recent DEXA scan.  She complains of pruritus.  Current Outpatient Medications  Medication Sig Dispense Refill   Calcium 500-100 MG-UNIT CHEW Chew by mouth.     calcium-vitamin D (OSCAL WITH D) 500-200 MG-UNIT tablet Take 1 tablet by mouth.     OVER THE COUNTER MEDICATION      predniSONE (DELTASONE) 50 MG tablet Take 1 tablet 13 hours, 7 hours, and 1 hour before exam (Patient not taking: Reported on 09/13/2019) 3 tablet 0   Turmeric (QC TUMERIC COMPLEX PO) Take by mouth.     ursodiol (ACTIGALL) 250 MG tablet Take 1 tablet (250 mg total) by mouth 4 (four) times daily. With food 120 tablet 5   No current facility-administered medications for this visit.     Allergies as of 09/26/2019 - Review Complete 09/13/2019  Allergen Reaction Noted   Latex Shortness Of Breath and Cough 07/10/2016   Tolmetin Hives 01/11/2014   Levemir [insulin detemir] Hives 02/26/2016   Nsaids Hives 01/11/2014   Gadolinium derivatives Hives 03/15/2019    ROS:  General: Negative for anorexia, weight loss, fever, chills, fatigue, weakness. ENT: Negative for hoarseness, difficulty swallowing , nasal congestion. CV: Negative for chest pain, angina, palpitations, dyspnea on exertion, peripheral edema.  Respiratory: Negative for dyspnea at rest,  dyspnea on exertion, cough, sputum, wheezing.  GI: See history of present illness. GU:  Negative for dysuria, hematuria, urinary incontinence, urinary frequency, nocturnal urination.  Endo: Negative for unusual weight change.    Physical Examination:   LMP 07/11/2016 (Exact Date)   General: Well-nourished, well-developed in no acute distress.  Eyes: No icterus. Conjunctivae  pink. Mouth: Oropharyngeal mucosa moist and pink , no lesions erythema or exudate. Lungs: Clear to auscultation bilaterally. Non-labored. Heart: Regular rate and rhythm, no murmurs rubs or gallops.  Abdomen: Bowel sounds are normal, nontender, nondistended, no hepatosplenomegaly or masses, no abdominal bruits or hernia , no rebound or guarding.   Extremities: No lower extremity edema. No clubbing or deformities. Neuro: Alert and oriented x 3.  Grossly intact. Skin: Warm and dry, no jaundice.   Psych: Alert and cooperative, normal mood and affect.   Imaging Studies: US Biopsy (liver)  Result Date: 09/13/2019 INDICATION: 53 year old female with elevated LFTs. EXAM: Ultrasound-guided core biopsy of the liver Interventional Radiologist:  Criselda Peaches, MD MEDICATIONS: None. ANESTHESIA/SEDATION: Fentanyl 100 mcg IV; Versed 2 mg IV Moderate Sedation Time:  10 minutes The patient was continuously monitored during the procedure by the interventional radiology nurse under my direct supervision. FLUOROSCOPY TIME:  None. COMPLICATIONS: None immediate. PROCEDURE: Informed consent was obtained from the patient following explanation of the procedure, risks, benefits and alternatives. The patient understands, agrees and consents for the procedure. All questions were addressed. A time out was performed. The right upper quadrant was interrogated with ultrasound. A relatively avascular plane of the liver was identified. A suitable skin entry site was selected and marked. The region was then sterilely prepped and draped in standard fashion using chlorhexidine skin prep. Local anesthesia was attained by infiltration with 1% lidocaine. A small dermatotomy was made. Under real-time sonographic guidance, a 17 gauge trocar needle was advanced into the liver. Multiple 18 gauge core biopsies were then coaxially obtained. Needle placement was confirmed on all biopsy passes with real-time sonography. Biopsy specimens were  placed in formalin and delivered to pathology for further analysis. As the introducer needle was removed, the biopsy tract was embolized with a Gel-Foam slurry. Post biopsy ultrasound imaging demonstrates no active bleeding or perihepatic hematoma. The patient tolerated the procedure well. IMPRESSION: Technically successful ultrasound-guided random core biopsy of the liver. Electronically Signed   By: Jacqulynn Cadet M.D.   On: 09/13/2019 12:43    Assessment and Plan:   Keeleigh Terris is a 53 y.o. y/o female here to follow-up follow-up for abnormal liver function test, fatty liver disease, colorectal cancer screening.  With a positive antimitochondrial antibody I am very suspicious of primary biliary cholangitis.  Plan 1.   Continue ursodeoxycholic acid, check CMP, CBC, PT/INR every 3 to 6 months. 2.  Hepatitis A immunization, pneumococcal vaccine.  3.  Check vitamin A, vitamin D, TSH yearly.  DEXA scan every 2 years. 4.  Hopefully the ursodeoxycholic acid will help with the pruritus but if not commence on nonsedating antihistaminic such as loratadine.    Dr Jonathon Bellows  MD,MRCP [facility]) Follow up in 3 months

## 2019-09-26 NOTE — Patient Instructions (Signed)
Primary Biliary Cholangitis  Primary biliary cholangitis is a long-term (chronic) liver disease. Your liver is important for functions such as absorbing nutrients from food, removing waste from the body, and making proteins and substances that help your blood clot. Primary biliary cholangitis destroys the tube-like structures (bile ducts) in the liver that produce the digestive fluid called bile. Bile is necessary for absorbing fats, cholesterol, and fat-soluble vitamins. As bile ducts are destroyed, bile backs up in your liver and causes liver damage. It can lead to scarring of the liver (cirrhosis). What are the causes? The exact cause of this condition is not known. It may be an autoimmune disease. If you have an autoimmune disease, your body's defense system (immune system) mistakenly attacks normal cells. In primary biliary cholangitis, the immune system attacks the bile ducts. What increases the risk? You are more likely to develop this condition if:  You have a family history of the disease. If you inherited the gene for the disease, something might trigger the gene to become active. Possible triggers include: ? Cigarette smoke. ? Exposure to toxic chemicals. ? Certain infections.  You are a woman.  You are 68-37 years old. What are the signs or symptoms? In the early stages of the disease, you might not show any signs or symptoms. The earliest symptoms are:  Fatigue.  Very itchy skin.  Dry eyes or mouth.  Pain in the abdomen, under your right rib.  Slight yellow appearance in the whites of your eyes or in your skin (jaundice). Later signs and symptoms develop as problems related to liver damage occur. These can include:  Swelling of the feet and legs (edema).  Swelling of the abdomen (ascites).  Mental confusion and impaired mental functioning (hepatic encephalopathy).  Darkening and increased jaundice of the skin.  Fatty bowel movements.  Worsening right-sided  abdominal pain.  Vomiting blood. How is this diagnosed? Your health care provider may suspect primary biliary cholangitis if you have abnormal results in a blood test for liver function. This test is often done as part of a routine physical exam. Your health care provider may also check to see if:  Your liver is enlarged.  Your spleen is enlarged.  You have jaundice.  You have swelling in your legs or abdomen. Tests may also be done to confirm the diagnosis. These may include:  A blood test. This looks for a specific type of immune system protein (antimitochondrial antibodies, or AMA).  Imaging studies. An ultrasound is used to create pictures of your liver.  Liver biopsy. A piece of tissue from your liver is checked under a microscope. How is this treated? For early treatment of this condition, your health care provider will prescribe a bile substitute medicine (ursodiol). You will likely need to take this medicine for the rest of your life. Also, you should avoid:  Alcohol.  Hepatotoxic medicines. These are medicines that are damaging to the liver when taken too often or in excessive amounts. These can include: ? Over-the-counter and prescription medicines. ? Herbal treatments. ? Vitamins. ? Dietary supplements. As primary biliary cholangitis progresses, you may need other treatments, such as:  Antihistamines to relieve itching.  Artificial tears and saliva to relieve dry eyes and mouth.  Medicines that lower blood pressure and reduce swelling (diuretics).  A laxative (lactulose) to remove toxic substances that cause hepatic encephalopathy.  A liver transplant if primary biliary cholangitis develops into cirrhosis. The earlier you start treatment, the more likely it is that your condition  will be slowed down. Follow these instructions at home: Lifestyle   Work with a dietitian to create a meal plan that gives you the right amount of protein and nutrition with less  salt.  Get regular exercise. Ask your health care provider what the right amount of exercise is for you.  Do not use any products that contain nicotine or tobacco, such as cigarettes and e-cigarettes. If you need help quitting, ask your health care provider.  Do not drink alcohol.  Do not eat raw shellfish. General instructions  Learn as much as you can about your disease, and work closely with your health care providers.  You may need to have regular blood tests to monitor your liver function.  Take over-the-counter and prescription medicines only as told by your health care provider.  Drink enough fluid to keep your urine pale yellow. Contact a health care provider if:  Your symptoms are gradually getting worse. Get help right away if:  You vomit blood.  Your itching suddenly worsens or stops.  You develop new jaundice.  You become confused or pass out.  You develop sudden new swelling in your legs or abdomen. Summary  Primary biliary cholangitis is a chronic liver disease that destroys the bile ducts in the liver. It can lead to cirrhosis. It may be an autoimmune disease.  You are more likely to develop this disease if others in your family have it, if you are female, and if you are 26-53 years old.  Early symptoms include fatigue, itching, and pain in the abdomen. Later symptoms include ascites, edema, worsening jaundice, and hepatic encephalopathy.  Diagnosis of primary biliary cholangitis may involve blood tests, imaging tests such as ultrasound, and liver biopsy.  Medicines are available to treat this condition. You may also be required to limit salt, avoid hepatotoxic medicines, and stop drinking alcohol. This information is not intended to replace advice given to you by your health care provider. Make sure you discuss any questions you have with your health care provider. Document Released: 09/18/2004 Document Revised: 02/17/2019 Document Reviewed: 12/01/2017  Elsevier Patient Education  2020 [employer].

## 2019-10-12 ENCOUNTER — Other Ambulatory Visit: Payer: Self-pay | Admitting: Family

## 2019-10-12 DIAGNOSIS — Z1231 Encounter for screening mammogram for malignant neoplasm of breast: Secondary | ICD-10-CM

## 2019-10-19 ENCOUNTER — Ambulatory Visit
Admission: RE | Admit: 2019-10-19 | Discharge: 2019-10-19 | Disposition: A | Discharge status: Home / Self Care | Payer: Managed Care, Other (non HMO) | Source: Ambulatory Visit | Attending: Family | Admitting: Family

## 2019-10-19 DIAGNOSIS — Z1231 Encounter for screening mammogram for malignant neoplasm of breast: Secondary | ICD-10-CM | POA: Insufficient documentation

## 2019-10-24 NOTE — Telephone Encounter (Signed)
Cancel appointments

## 2019-11-28 DIAGNOSIS — K743 Primary biliary cirrhosis: Secondary | ICD-10-CM | POA: Insufficient documentation

## 2019-11-28 DIAGNOSIS — Z87898 Personal history of other specified conditions: Secondary | ICD-10-CM | POA: Insufficient documentation

## 2019-12-22 ENCOUNTER — Other Ambulatory Visit: Payer: Self-pay

## 2019-12-26 ENCOUNTER — Other Ambulatory Visit: Payer: Self-pay

## 2019-12-26 ENCOUNTER — Encounter: Payer: Self-pay | Admitting: Internal Medicine

## 2019-12-26 ENCOUNTER — Ambulatory Visit: Payer: 59 | Admitting: Internal Medicine

## 2019-12-26 VITALS — BP 148/86 | HR 87 | Temp 98.2°F | Ht 68.0 in | Wt 180.0 lb

## 2019-12-26 DIAGNOSIS — E042 Nontoxic multinodular goiter: Secondary | ICD-10-CM

## 2019-12-26 DIAGNOSIS — N2581 Secondary hyperparathyroidism of renal origin: Secondary | ICD-10-CM | POA: Diagnosis not present

## 2019-12-26 DIAGNOSIS — Z9884 Bariatric surgery status: Secondary | ICD-10-CM

## 2019-12-26 DIAGNOSIS — M839 Adult osteomalacia, unspecified: Secondary | ICD-10-CM

## 2019-12-26 NOTE — Patient Instructions (Signed)
labs today

## 2019-12-26 NOTE — Progress Notes (Signed)
Name: [name]  MRN/ DOB: [id number], 1965/12/22    Age/ Sex: 54 y.o., female     PCP: [name], FNP   Reason for Endocrinology Evaluation: Thyroid nodule /osteoporosis/osteomalacia     Initial Endocrinology Clinic Visit: 06/26/2019    PATIENT IDENTIFIER: [name] is a 54 y.o., female with a past medical history of HTN,T2DM and Hx of gastric bypass and primary biliary cholangitis . She has followed with [facility] since 06/26/2019 for consultative assistance with management of her thyroid nodules .   HISTORICAL SUMMARY:  Was diagnosed with MNG in 2006. Per pt she had drainage of a thyroid cyst with benign cytology. In 2020 she presented to her PCP with c/o worsening neck enlargement, associated with dysphagia,and discomfort. An ultrasound showed a left thyroid nodule that met criteria for FNA. The nodule was smaller then it has been years ago and the pt opted not to proceed with FNA .     Osteoporosis History: She is S/P Gastric Bypass sx in  07/2016 , Pre-surgery weight was 333 Lbs, lowest weight since sx 150 lbs. She was on 2 bariatric vitamins up to a year ago and was stopped due to recurrent renal stones, stopping the vitamins did not help and had ~ 17 renal stones in the past year. Pt had a DXA 04/14/2019 with a T-Score of -3.0 which prompted her to start taking her vitamins . I suspected this is osteomalacia but I explained to her, there could be an osteoporosis component as well.   SUBJECTIVE:   During last visit (06/26/19): She was advised to continue with Vitamin D, calcium and MVI   Today (12/27/2019):  [name] is here for a follow up on osteomalacia and secondary hyperparathyroidism .  Since her last visit here she has been diagnosed with primary biliary cholangitis. She is currently following up with [facility], pruritus has been stable with current regimen. She has fatigue and extreme joint pain, she was on some medication to  help her stay up during the day in the past?. She does not recall the name.   Denies any local neck enlargement, she denies any dysphagia Has noted some weight gain    Currently on Vitamin code raw calcium 4 tabs daily ( each tab contains 1100 of calcium and 1600 iu of vitamin D3)   ROS:  As per HPI.   HISTORY:  Past Medical History:  Past Medical History:  Diagnosis Date  . Asthma due to seasonal allergies    no inhaler  . History of diabetes mellitus, type II    per pt resolved after bariatric surgery 2017  . History of hypertension    per pt resolved after bariatric surgery 2017  . History of kidney stones   . History of obstructive sleep apnea    per pt hx osa w/ cpap,  retested after bariatric surgery 2017 result negative  . History of skin cancer    nose, not melanoma  . Hydronephrosis, right   . Hyperlipidemia   . MRSA carrier   . NAFL (nonalcoholic fatty liver)   . Osteoporosis   . Renal calculi    per CT 04-18-2019 bilateral , non-obstructive  . S/P bariatric surgery 08/03/2016   duodenal switch w/ hh repair  . Ureteral calculus, right   . Wears glasses    Past Surgical History:  Past Surgical History:  Procedure Laterality Date  . BARIATRIC SURGERY  08-03-2016    dr Darnell Level '@[facility] ,  [place]   LAPAROSCOPY DUODENAL SWITCH AND HIATAL HERNIA REPAIR  . BREAST BIOPSY Right 2017   FIBROCYSTIC CHANGES WITH CALCIFICATIONS  . CESAREAN SECTION  x2  last one 09-10-2000  . COLONOSCOPY WITH PROPOFOL N/A 09/11/2019   Procedure: COLONOSCOPY WITH PROPOFOL;  Surgeon: Jonathon Bellows, MD;  [facility] END[facility]COPY;  Service: Gastroenterology;  Laterality: N/A;  . CYSTOSCOPY WITH RETROGRADE PYELOGRAM, URETEROSCOPY AND STENT PLACEMENT Right 02-07-2012  '@[facility]'$   . CYSTOSCOPY WITH STENT PLACEMENT Right 09/10/2017   Procedure: CYSTOSCOPY WITH STENT PLACEMENT;  Surgeon: Abbie Sons, MD;  Location: [facility] ORS;  Service: Urology;  Laterality: Right;  . CYSTOSCOPY/URETEROSCOPY/HOLMIUM  LASER/STENT PLACEMENT Right 04/24/2019   Procedure: CYSTOSCOPY/URETEROSCOPY/STENT PLACEMENT/ RIGHT RETROGRADE;  Surgeon: Festus Aloe, MD;  Location[facility];  Service: Urology;  Laterality: Right;  . DX LAPAROSCOPY/  EGD/  HIATAL HERNIA REPAIR CLOSURE OF INTERNAL MENSENTRY DEFECT  10-28-2017   '@[facility]'$ ,  [place]  . EXTRACORPOREAL SHOCK WAVE LITHOTRIPSY N/A 01/30/2019   Procedure: EXTRACORPOREAL SHOCK WAVE LITHOTRIPSY (ESWL) LEFT/ POSSIBLE RIGHT;  Surgeon: Festus Aloe, MD;  Location: [facility] ORS;  Service: Urology;  Laterality: N/A;  . EXTRACORPOREAL SHOCK WAVE LITHOTRIPSY Right 03/20/2019   Procedure: EXTRACORPOREAL SHOCK WAVE LITHOTRIPSY (ESWL);  Surgeon: Franchot Gallo, MD;  Location: [facility] ORS;  Service: Urology;  Laterality: Right;  . LAPAROSCOPIC CHOLECYSTECTOMY  10-15-2016   '@[facility]'$  ,  [place]  . TONSILLECTOMY AND ADENOIDECTOMY  child  . URETEROSCOPY WITH HOLMIUM LASER LITHOTRIPSY Right 09/10/2017   Procedure: URETEROSCOPY WITH HOLMIUM LASER LITHOTRIPSY;  Surgeon: Abbie Sons, MD;  Location: [facility] ORS;  Service: Urology;  Laterality: Right;    Social History:  reports that she has never smoked. She has never used smokeless tobacco. She reports that she does not drink alcohol or use drugs. Family History:  Family History  Problem Relation Age of Onset  . Alcohol abuse Mother   . Hyperlipidemia Mother   . Hypertension Mother   . Lymphoma Mother        primary site : brain  . Hyperlipidemia Father   . Hypertension Father   . Parkinson's disease Father   . Drug abuse Sister   . Cancer Maternal Grandmother        breast cancer  . Breast cancer Maternal Grandmother   . Cancer Paternal Grandmother   . Kidney cancer Neg Hx   . Prostate cancer Neg Hx      HOME MEDICATIONS: Allergies as of 12/26/2019      Reactions   Latex Shortness Of Breath, Cough   Tolmetin Hives   Levemir [insulin Detemir] Hives   Nsaids Hives   Per pt stated has to take ibuprofen with a  antihistimine   Gadolinium Derivatives Hives   Pt c/o itching after contrast injection 03/15/19 @ 8:45am. [facility] Per Dr. Kathlene Cote document this as allergic reaction.       Medication List       Accurate as of December 26, 2019 11:59 PM. If you have any questions, ask your nurse or doctor.        Calcium 500-100 MG-UNIT Chew Chew by mouth.   calcium-vitamin D 500-200 MG-UNIT tablet Commonly known as: OSCAL WITH D Take 1 tablet by mouth.   NON FORMULARY Vitamin code raw calcium   OVER THE COUNTER MEDICATION   predniSONE 50 MG tablet Commonly known as: DELTASONE Take 1 tablet 13 hours, 7 hours, and 1 hour before exam   QC TUMERIC COMPLEX PO Take by mouth.   sertraline 25 MG tablet  Commonly known as: ZOLOFT Take by mouth.   ursodiol 250 MG tablet Commonly known as: ACTIGALL Take 1 tablet (250 mg total) by mouth 4 (four) times daily. With food         OBJECTIVE:   PHYSICAL EXAM: VS: BP (!) 148/86 (BP Location: Left Arm, Patient Position: Sitting, Cuff Size: Normal)   Pulse 87   Temp 98.2 F (36.8 C)   Ht '5\' 8"'$  (1.727 m)   Wt 180 lb (81.6 kg)   LMP 07/11/2016 (Exact Date)   SpO2 97%   BMI 27.37 kg/m    EXAM: General: Pt appears well and is in NAD  Neck: General: Supple without adenopathy. Thyroid: Thyroid size normal. Left  nodule appreciated.   Lungs: Clear with good BS bilat with no rales, rhonchi, or wheezes  Heart: Auscultation: RRR.  Abdomen: Normoactive bowel sounds, soft, nontender, without masses or organomegaly palpable  Extremities:  BL LE: No pretibial edema normal ROM and strength.  Neuro: Cranial nerves: II - XII grossly intact  Motor: Normal strength throughout  Mental Status: Judgment, insight: Intact Orientation: Oriented to time, place, and person Mood and affect: No depression, anxiety, or agitation     DATA REVIEWED: Pending     ASSESSMENT / PLAN / RECOMMENDATIONS:   1. Multinodular Goiter:   - Pt is clinically and  biochemically euthyroid  - In 2006 her left thyroid nodule was 3.7 cm which apparently had been drained, repeat ultrasound 2020 shows a 2.5 cm nodule on the left which is reassuring that this is benign given the stability over the years, and opted not to proceed with FNA at the time. I have advised the pt that without a tissue sample we can;t guarantee that this is not malignant.  - We have decided to proceed with short term  Thyroid ultrasound follow up at this time.     2. Osteomalacia/Osteoporosis  :   - I did suspect that her abnormal DXA is more consistent with  osteomalacia, given low calcium, low vitamin D at the time,which is  due to malabsorption due to  hx of gastric bypass and non-compliance with vitamin intake.  - She could also have a combination of osteoporosis and osteomalacia, they will both look at the same on DXA , the only way to differentiate is through biochemical testing.  - She has been compliant with her vitamin intake in the past at least 6 months - Will repeat DXA by next visit   F/U in 6 months     Signed electronically by: Mack Guise, MD  [facility]  [facility] [facility] [address] [place], [place] [address] Phone: [phone] FAX: [phone]      CC: [name], FNP [address] [place] [address] Phone: [phone]  Fax: [phone]   Return to Endocrinology clinic as below: Future Appointments  Date Time Provider [place]  01/15/2020  1:00 PM [facility] [place] [facility]  06/24/2020  3:40 PM Zakeya Junker, Melanie Crazier, MD [facility] None

## 2019-12-27 DIAGNOSIS — M839 Adult osteomalacia, unspecified: Secondary | ICD-10-CM | POA: Insufficient documentation

## 2020-01-01 ENCOUNTER — Ambulatory Visit: Payer: Managed Care, Other (non HMO) | Admitting: Gastroenterology

## 2020-01-15 ENCOUNTER — Ambulatory Visit
Admission: RE | Admit: 2020-01-15 | Discharge: 2020-01-15 | Disposition: A | Discharge status: Home / Self Care | Payer: 59 | Source: Ambulatory Visit | Attending: Internal Medicine | Admitting: Internal Medicine

## 2020-01-15 DIAGNOSIS — E042 Nontoxic multinodular goiter: Secondary | ICD-10-CM

## 2020-06-20 ENCOUNTER — Ambulatory Visit
Admission: EM | Admit: 2020-06-20 | Discharge: 2020-06-20 | Disposition: A | Discharge status: Home / Self Care | Payer: 59

## 2020-06-20 ENCOUNTER — Other Ambulatory Visit: Payer: Self-pay

## 2020-06-20 DIAGNOSIS — R030 Elevated blood-pressure reading, without diagnosis of hypertension: Secondary | ICD-10-CM | POA: Diagnosis not present

## 2020-06-20 DIAGNOSIS — H1131 Conjunctival hemorrhage, right eye: Secondary | ICD-10-CM

## 2020-06-20 NOTE — Discharge Instructions (Addendum)
Please see the attached information on your eye hemorrhage.  Schedule a follow up appointment with your eye care provider in 1-2 days.   Your blood pressure is elevated today at 158/87.  Please have this rechecked by your primary care provider in 2-4 weeks.

## 2020-06-20 NOTE — ED Triage Notes (Signed)
Patient reports she had a headache last night followed pressure behind the eye. When looking in the mirror, patient noticed eye was red. Reports this is the fourth time this has happened in six months. Reports it has happened in both eyes. Reports some discomfort, stating it feels like something is in there irritating it.

## 2020-06-20 NOTE — ED Provider Notes (Signed)
[name]    CSN: [id number] Arrival date & time: 06/20/20  1027      History   Chief Complaint Chief Complaint  Patient presents with  . Eye Problem    HPI [name] is a 54 y.o. female.   Patient presents with redness in her right eye.  No drainage or itching.  No acute eye pain or changes in her vision.  She states she had a headache last night and felt pressure in her eye then developed the redness.  She states this has occurred 4 times in the last 6 months.  She has not sought care for this previously.  She denies current headache, dizziness, weakness, fever, chills, chest pain, cough, shortness of breath, abdominal pain, or other symptoms.  Patient had history of hypertension prior to her bariatric surgery in 2017.      The history is provided by the patient.    Past Medical History:  Diagnosis Date  . Asthma due to seasonal allergies    no inhaler  . History of diabetes mellitus, type II    per pt resolved after bariatric surgery 2017  . History of hypertension    per pt resolved after bariatric surgery 2017  . History of kidney stones   . History of obstructive sleep apnea    per pt hx osa w/ cpap,  retested after bariatric surgery 2017 result negative  . History of skin cancer    nose, not melanoma  . Hydronephrosis, right   . Hyperlipidemia   . MRSA carrier   . NAFL (nonalcoholic fatty liver)   . Osteoporosis   . Renal calculi    per CT 04-18-2019 bilateral , non-obstructive  . S/P bariatric surgery 08/03/2016   duodenal switch w/ hh repair  . Ureteral calculus, right   . Wears glasses     Patient Active Problem List   Diagnosis Date Noted  . Osteomalacia 12/27/2019  . Localized osteoporosis without current pathological fracture 06/26/2019  . Hypocalcemia 06/26/2019  . Secondary hyperparathyroidism ([place]) 06/26/2019  . Multinodular goiter 06/26/2019  . Dysphagia 05/05/2019  . Osteoporosis 05/03/2019  . Memory loss 02/13/2019    . Right ureteral calculus 08/31/2017  . Hydronephrosis with urinary obstruction due to ureteral calculus 08/31/2017  . Nephrolithiasis 08/31/2017  . Screen for colon cancer 07/26/2017  . Depression, recurrent ([place]) 06/22/2017  . Gross hematuria 11/03/2016  . Sleep apnea 10/06/2016  . Hyperlipidemia 09/21/2016  . Candidal intertrigo 09/21/2016  . H/O gastric bypass 09/21/2016  . Nocturnal leg cramps 01/21/2015  . Elevated liver enzymes 09/04/2014  . Vitamin D deficiency 09/03/2014  . Encounter for routine gynecological examination 06/18/2014  . Diabetes type 2, uncontrolled ([place]) 01/22/2014  . Routine general medical examination at a health care facility 01/11/2014  . Numbness and tingling of both legs 01/11/2014  . Knee pain, bilateral 01/11/2014  . Obesity 01/11/2014  . HTN (hypertension) 01/11/2014    Past Surgical History:  Procedure Laterality Date  . BARIATRIC SURGERY  08-03-2016    dr Darnell Level @[facility] ,  [place]   LAPAROSCOPY DUODENAL SWITCH AND HIATAL HERNIA REPAIR  . BREAST BIOPSY Right 2017   FIBROCYSTIC CHANGES WITH CALCIFICATIONS  . CESAREAN SECTION  x2  last one 09-10-2000  . COLONOSCOPY WITH PROPOFOL N/A 09/11/2019   Procedure: COLONOSCOPY WITH PROPOFOL;  Surgeon: Jonathon Bellows, MD;  Location: [facility] ENDOSCOPY;  Service: Gastroenterology;  Laterality: N/A;  . CYSTOSCOPY WITH RETROGRADE PYELOGRAM, URETEROSCOPY AND STENT PLACEMENT Right 02-07-2012  @[facility]   .  CYSTOSCOPY WITH STENT PLACEMENT Right 09/10/2017   Procedure: CYSTOSCOPY WITH STENT PLACEMENT;  Surgeon: Abbie Sons, MD;  Location: [facility] ORS;  Service: Urology;  Laterality: Right;  . CYSTOSCOPY/URETEROSCOPY/HOLMIUM LASER/STENT PLACEMENT Right 04/24/2019   Procedure: CYSTOSCOPY/URETEROSCOPY/STENT PLACEMENT/ RIGHT RETROGRADE;  Surgeon: Festus Aloe, MD;  Location: [facility];  Service: Urology;  Laterality: Right;  . DX LAPAROSCOPY/  EGD/  HIATAL HERNIA REPAIR CLOSURE OF INTERNAL MENSENTRY DEFECT   10-28-2017   @[facility] ,  [place]  . EXTRACORPOREAL SHOCK WAVE LITHOTRIPSY N/A 01/30/2019   Procedure: EXTRACORPOREAL SHOCK WAVE LITHOTRIPSY (ESWL) LEFT/ POSSIBLE RIGHT;  Surgeon: Festus Aloe, MD;  Location: [facility];  Service: Urology;  Laterality: N/A;  . EXTRACORPOREAL SHOCK WAVE LITHOTRIPSY Right 03/20/2019   Procedure: EXTRACORPOREAL SHOCK WAVE LITHOTRIPSY (ESWL);  Surgeon: Franchot Gallo, MD;  Location: [facility];  Service: Urology;  Laterality: Right;  . LAPAROSCOPIC CHOLECYSTECTOMY  10-15-2016   @[facility]  ,  [place]  . TONSILLECTOMY AND ADENOIDECTOMY  child  . URETEROSCOPY WITH HOLMIUM LASER LITHOTRIPSY Right 09/10/2017   Procedure: URETEROSCOPY WITH HOLMIUM LASER LITHOTRIPSY;  Surgeon: Abbie Sons, MD;  Location: [facility] ORS;  Service: Urology;  Laterality: Right;    OB History    Gravida  3   Para  2   Term      Preterm      AB  1   Living        SAB  1   TAB      Ectopic      Multiple      Live Births           Obstetric Comments  1st Menstrual Cycle:  ? 1st Pregnancy:  21         Home Medications    Prior to Admission medications   Medication Sig Start Date End Date Taking? Authorizing Provider  Calcium 500-100 MG-UNIT CHEW Chew by mouth.    [provider]  calcium-vitamin D (OSCAL WITH D) 500-200 MG-UNIT tablet Take 1 tablet by mouth.    [provider]  NON FORMULARY Vitamin code raw calcium    [provider]  OVER THE COUNTER MEDICATION     [provider]  sertraline (ZOLOFT) 25 MG tablet Take by mouth. 11/28/19 11/27/20  [provider]  Turmeric (QC TUMERIC COMPLEX PO) Take by mouth.    [provider]    Family History Family History  Problem Relation Age of Onset  . Alcohol abuse Mother   . Hyperlipidemia Mother   . Hypertension Mother   . Lymphoma Mother        primary site : brain  . Hyperlipidemia Father   . Hypertension Father   . Parkinson's disease Father   . Drug abuse  Sister   . Cancer Maternal Grandmother        breast cancer  . Breast cancer Maternal Grandmother   . Cancer Paternal Grandmother   . Kidney cancer Neg Hx   . Prostate cancer Neg Hx     Social History Social History   Tobacco Use  . Smoking status: Never Smoker  . Smokeless tobacco: Never Used  Vaping Use  . Vaping Use: Never used  Substance Use Topics  . Alcohol use: No    Alcohol/week: 0.0 standard drinks  . Drug use: Never     Allergies   Latex, Tolmetin, Levemir [insulin detemir], Nsaids, and Gadolinium derivatives   Review of Systems Review of Systems  Constitutional: Negative for chills and fever.  HENT: Negative for ear pain and sore throat.   Eyes: Positive for redness. Negative for photophobia, pain, discharge, itching and visual disturbance.  Respiratory: Negative for cough and shortness of breath.   Cardiovascular: Negative for chest pain and palpitations.  Gastrointestinal: Negative for abdominal pain and vomiting.  Genitourinary: Negative for dysuria and hematuria.  Musculoskeletal: Negative for arthralgias and back pain.  Skin: Negative for color change and rash.  Neurological: Positive for headaches. Negative for dizziness, seizures, syncope, facial asymmetry, speech difficulty and weakness.  All other systems reviewed and are negative.    Physical Exam Triage Vital Signs ED Triage Vitals  Enc Vitals Group     BP      Pulse      Resp      Temp      Temp src      SpO2      Weight      Height      Head Circumference      Peak Flow      Pain Score      Pain Loc      Pain Edu?      Excl. in [place]?    No data found.  Updated Vital Signs BP (!) 158/87   Pulse (!) 57   Temp 98.8 F (37.1 C)   Resp 13   LMP 07/11/2016 (Exact Date)   SpO2 96%   Visual Acuity Right Eye Distance: 20/20 Left Eye Distance: 20/20 Bilateral Distance: 20/20  Right Eye Near:   Left Eye Near:    Bilateral Near:     Physical Exam Vitals and nursing note  reviewed.  Constitutional:      General: She is not in acute distress.    Appearance: She is well-developed.  HENT:     Head: Normocephalic and atraumatic.     Mouth/Throat:     Mouth: Mucous membranes are moist.  Eyes:     General:        Right eye: No discharge.        Left eye: No discharge.     Extraocular Movements: Extraocular movements intact.     Conjunctiva/sclera:     Right eye: Hemorrhage present.     Pupils: Pupils are equal, round, and reactive to light.  Cardiovascular:     Rate and Rhythm: Normal rate and regular rhythm.     Heart sounds: No murmur heard.   Pulmonary:     Effort: Pulmonary effort is normal. No respiratory distress.     Breath sounds: Normal breath sounds.  Abdominal:     Palpations: Abdomen is soft.     Tenderness: There is no abdominal tenderness. There is no guarding or rebound.  Musculoskeletal:     Cervical back: Neck supple.  Skin:    General: Skin is warm and dry.     Findings: No rash.  Neurological:     General: No focal deficit present.     Mental Status: She is alert and oriented to person, place, and time.     Gait: Gait normal.  Psychiatric:        Mood and Affect: Mood normal.        Behavior: Behavior normal.      UC Treatments / Results  Labs (all labs ordered are listed, but only abnormal results are displayed) Labs Reviewed - No data to display  EKG   Radiology No results found.  Procedures Procedures (including critical care time)  Medications Ordered in UC Medications -  No data to display  Initial Impression / Assessment and Plan / UC Course  I have reviewed the triage vital signs and the nursing notes.  Pertinent labs & imaging results that were available during my care of the patient were reviewed by me and considered in my medical decision making (see chart for details).   Right conjunctival hemorrhage.  Elevated blood pressure reading.  Education provided to patient about eye hemorrhages.   Instructed her to follow-up with her eye care provider in 1 to 2 days for recheck.  Discussed with patient that her blood pressure is elevated today and needs to be rechecked by her PCP in 2 to 4 weeks.  Patient agrees to plan of care.   Final Clinical Impressions(s) / UC Diagnoses   Final diagnoses:  Conjunctival hemorrhage of right eye  Elevated blood pressure reading     Discharge Instructions     Please see the attached information on your eye hemorrhage.  Schedule a follow up appointment with your eye care provider in 1-2 days.   Your blood pressure is elevated today at 158/87.  Please have this rechecked by your primary care provider in 2-4 weeks.         ED Prescriptions    None     PDMP not reviewed this encounter.   Sharion Balloon, NP 06/20/20 [phone]

## 2020-06-24 ENCOUNTER — Encounter: Payer: Self-pay | Admitting: Internal Medicine

## 2020-06-24 ENCOUNTER — Ambulatory Visit: Payer: 59 | Admitting: Internal Medicine

## 2020-06-24 ENCOUNTER — Other Ambulatory Visit: Payer: Self-pay

## 2020-06-24 VITALS — BP 130/80 | HR 70 | Ht 68.0 in | Wt 188.8 lb

## 2020-06-24 DIAGNOSIS — M810 Age-related osteoporosis without current pathological fracture: Secondary | ICD-10-CM | POA: Diagnosis not present

## 2020-06-24 DIAGNOSIS — E042 Nontoxic multinodular goiter: Secondary | ICD-10-CM

## 2020-06-24 MED ORDER — ERGOCALCIFEROL 1.25 MG (50000 UT) PO CAPS: 50000.0000 [IU] | ORAL_CAPSULE | ORAL | 1 refills | Status: DC

## 2020-06-24 NOTE — Progress Notes (Signed)
Name: [name]  MRN/ DOB: [id number], 04/20/1966    Age/ Sex: 54 y.o., female     PCP: [name], FNP   Reason for Endocrinology Evaluation: Thyroid nodule /osteoporosis/osteomalacia     Initial Endocrinology Clinic Visit: 06/26/2019    PATIENT IDENTIFIER: [name] is a 54 y.o., female with a past medical history of HTN,T2DM and Hx of gastric bypass and primary biliary cholangitis . She has followed with [facility] since 06/26/2019 for consultative assistance with management of her thyroid nodules .   HISTORICAL SUMMARY:  Was diagnosed with MNG in 2006. Per pt she had drainage of a thyroid cyst with benign cytology. In 2020 she presented to her PCP with c/o worsening neck enlargement, associated with dysphagia,and discomfort. An ultrasound showed a left thyroid nodule that met criteria for FNA. The nodule was smaller then it has been years ago and the pt opted not to proceed with FNA .     Osteoporosis History: She is S/P Gastric Bypass sx in  07/2016 , Pre-surgery weight was 333 Lbs, lowest weight since sx 150 lbs. She was on 2 bariatric vitamins up to a year ago and was stopped due to recurrent renal stones, stopping the vitamins did not help and had ~ 17 renal stones in the past year. Pt had a DXA 04/14/2019 with a T-Score of -3.0 which prompted her to start taking her vitamins . I suspected this is osteomalacia but I explained to her, there could be an osteoporosis component as well.   SUBJECTIVE:   During last visit (06/26/19): She was advised to continue with Vitamin D, calcium and MVI   Today (06/24/2020):  [name] is here for a follow up on osteomalacia and secondary hyperparathyroidism .  She continues to follow up at [facility] for primary biliary cholangitis.   Had recent from 4 tabs to 5 tabs daily   Denies any local neck enlargement, she denies any dysphagia Has noted some weight gain  Denies perioral  tingling and  numbness Denies spasms  Bariatric advantage 2 tabs daily  ( each tab contains 85 mg of calcium and 1500 iu of vitamin D3)  4 of the Raw calcium ( 1600 iu vitamin D and 1100 mg of calcium)    Has noted headaches and ruptured eye vessels 4x in the past 6 yrs. Saw ophthalmologist exam reassuring.     ROS:  As per HPI.   HISTORY:  Past Medical History:  Past Medical History:  Diagnosis Date  . Asthma due to seasonal allergies    no inhaler  . History of diabetes mellitus, type II    per pt resolved after bariatric surgery 2017  . History of hypertension    per pt resolved after bariatric surgery 2017  . History of kidney stones   . History of obstructive sleep apnea    per pt hx osa w/ cpap,  retested after bariatric surgery 2017 result negative  . History of skin cancer    nose, not melanoma  . Hydronephrosis, right   . Hyperlipidemia   . MRSA carrier   . NAFL (nonalcoholic fatty liver)   . Osteoporosis   . Renal calculi    per CT 04-18-2019 bilateral , non-obstructive  . S/P bariatric surgery 08/03/2016   duodenal switch w/ hh repair  . Ureteral calculus, right   . Wears glasses    Past Surgical History:  Past Surgical History:  Procedure Laterality Date  . BARIATRIC SURGERY  08-03-2016    dr Darnell Level _0 ,  [place]   LAPAROSCOPY DUODENAL SWITCH AND HIATAL HERNIA REPAIR  . BREAST BIOPSY Right 2017   FIBROCYSTIC CHANGES WITH CALCIFICATIONS  . CESAREAN SECTION  x2  last one 09-10-2000  . COLONOSCOPY WITH PROPOFOL N/A 09/11/2019   Procedure: COLONOSCOPY WITH PROPOFOL;  Surgeon: Jonathon Bellows, MD;  [facility] END[facility]COPY;  Service: Gastroenterology;  Laterality: N/A;  . CYSTOSCOPY WITH RETROGRADE PYELOGRAM, URETEROSCOPY AND STENT PLACEMENT Right 02-07-2012  _1   . CYSTOSCOPY WITH STENT PLACEMENT Right 09/10/2017   Procedure: CYSTOSCOPY WITH STENT PLACEMENT;  Surgeon: Abbie Sons, MD;  Location: [facility] ORS;  Service: Urology;  Laterality: Right;  .  CYSTOSCOPY/URETEROSCOPY/HOLMIUM LASER/STENT PLACEMENT Right 04/24/2019   Procedure: CYSTOSCOPY/URETEROSCOPY/STENT PLACEMENT/ RIGHT RETROGRADE;  Surgeon: Festus Aloe, MD;  Location: [facility];  Service: Urology;  Laterality: Right;  . DX LAPAROSCOPY/  EGD/  HIATAL HERNIA REPAIR CLOSURE OF INTERNAL MENSENTRY DEFECT  10-28-2017   _2 ,  [place]  . EXTRACORPOREAL SHOCK WAVE LITHOTRIPSY N/A 01/30/2019   Procedure: EXTRACORPOREAL SHOCK WAVE LITHOTRIPSY (ESWL) LEFT/ POSSIBLE RIGHT;  Surgeon: Festus Aloe, MD;  Location: [facility];  Service: Urology;  Laterality: N/A;  . EXTRACORPOREAL SHOCK WAVE LITHOTRIPSY Right 03/20/2019   Procedure: EXTRACORPOREAL SHOCK WAVE LITHOTRIPSY (ESWL);  Surgeon: Franchot Gallo, MD;  Location: [facility];  Service: Urology;  Laterality: Right;  . LAPAROSCOPIC CHOLECYSTECTOMY  10-15-2016   _3  ,  [place]  . TONSILLECTOMY AND ADENOIDECTOMY  child  . URETEROSCOPY WITH HOLMIUM LASER LITHOTRIPSY Right 09/10/2017   Procedure: URETEROSCOPY WITH HOLMIUM LASER LITHOTRIPSY;  Surgeon: Abbie Sons, MD;  Location: [facility] ORS;  Service: Urology;  Laterality: Right;    Social History:  reports that she has never smoked. She has never used smokeless tobacco. She reports that she does not drink alcohol and does not use drugs. Family History:  Family History  Problem Relation Age of Onset  . Alcohol abuse Mother   . Hyperlipidemia Mother   . Hypertension Mother   . Lymphoma Mother        primary site : brain  . Hyperlipidemia Father   . Hypertension Father   . Parkinson's disease Father   . Drug abuse Sister   . Cancer Maternal Grandmother        breast cancer  . Breast cancer Maternal Grandmother   . Cancer Paternal Grandmother   . Kidney cancer Neg Hx   . Prostate cancer Neg Hx      HOME MEDICATIONS: Allergies as of 06/24/2020      Reactions   Latex Shortness Of Breath, Cough   Tolmetin Hives   Levemir [insulin Detemir] Hives   Nsaids Hives    Per pt stated has to take ibuprofen with a antihistimine   Gadolinium Derivatives Hives   Pt c/o itching after contrast injection 03/15/19 @ 8:45am. [facility] Per Dr. Kathlene Cote document this as allergic reaction.       Medication List       Accurate as of June 24, 2020  3:56 PM. If you have any questions, ask your nurse or doctor.        Calcium 500-100 MG-UNIT Chew Chew by mouth.   calcium-vitamin D 500-200 MG-UNIT tablet Commonly known as: OSCAL WITH D Take 1 tablet by mouth.   NON FORMULARY Vitamin code raw calcium   OVER THE COUNTER MEDICATION   QC TUMERIC COMPLEX PO Take by mouth.   sertraline 25 MG tablet Commonly known as: ZOLOFT Take by mouth.  OBJECTIVE:   PHYSICAL EXAM: VS: BP 130/80 (BP Location: Left Arm, Patient Position: Sitting, Cuff Size: Normal)   Pulse 70   Ht _0  (1.727 m)   Wt 188 lb 12.8 oz (85.6 kg)   LMP 07/11/2016 (Exact Date)   SpO2 97%   BMI 28.71 kg/m    EXAM: General: Pt appears well and is in NAD  Neck: General: Supple without adenopathy. Thyroid: Thyroid size normal. Left  nodule appreciated.   Lungs: Clear with good BS bilat with no rales, rhonchi, or wheezes  Heart: Auscultation: RRR.  Abdomen: Normoactive bowel sounds, soft, nontender, without masses or organomegaly palpable  Extremities:  BL LE: No pretibial edema normal ROM and strength.  Neuro: Cranial nerves: II - XII grossly intact  Motor: Normal strength throughout  Mental Status: Judgment, insight: Intact Orientation: Oriented to time, place, and person Mood and affect: No depression, anxiety, or agitation     DATA REVIEWED: 05/01/2020  Ca 8.8  Alb 3.9  BUN/Cr 13/0.73 GFR 94 Alk phos 225  AST 35 ALT 33  Vitamin D 18.2  PTH 62 pg/mL       Thyroid ultrasound 01/15/2020 Estimated total number of nodules >/= 1 cm: 2  Number of spongiform nodules >/=  2 cm not described below (TR1): 0  Number of mixed cystic and solid nodules >/= 1.5 cm not  described below ([place]): 0  _________________________________________________________  Nodule labeled 1 right inferior thyroid, 1.1 cm. This remains TR 4 and meets criteria for surveillance.  Nodule labeled 2 inferior right thyroid, 9 mm, with spongiform characteristics and does not meet criteria for surveillance or biopsy.  Nodule labeled 3 inferior left thyroid, slightly smaller than previous and now 2.5 cm. This remains TR 3.  No adenopathy  IMPRESSION: Multinodular thyroid.  The left inferior thyroid nodule (labeled 3) has decreased in size from the prior, which typically indicates benign lesion and remains TR 3. If no prior biopsy has been performed, surveillance up to 5 years may be considered. If prior biopsy has been performed and result was benign, no further specific follow-up would be indicated.  Right inferior thyroid nodule (labeled 1, 1.1 cm, TR 4) meets criteria for surveillance, as designated by the newly established ACR TI-RADS criteria. Surveillance ultrasound study recommended to be performed annually up to 5 years.   ASSESSMENT / PLAN / RECOMMENDATIONS:   1. Multinodular Goiter:   - Pt is clinically and biochemically euthyroid  - No local neck symptoms  - In 2006 her left thyroid nodule was 3.7 cm which apparently had been drained, repeat ultrasound 2020 shows a 2.5 cm nodule on the left which is reassuring, pt  opted not to proceed with FNA at the time, and opted for close monitoring - Will order a follow up ultrasound    2. Osteomalacia/Osteoporosis  :   - I  suspect that her abnormal DXA is more consistent with  Osteomalacia, but she could have a mixed osteoporosis as well - She has been compliant with her vitamin intake  - Will repeat DXA - pt provided contact info to schedule at [facility]     Bariatric advantage 2 tabs daily  ( each tab contains 85 mg of calcium and 1500 iu of vitamin D3)  4 of  the Raw calcium ( 1600 iu vitamin D and 1100 mg of calcium)   3. Vitamin D deficiency:  - Will add ergocalciferol 50,000 iu weekly to her above regimen.  4. Subconjunctival Hemorrhages:  - She is concerned about this, was recently evaluated by ophthalmology and no pathology was found. Her CBC is normal and reassurance was provided today - I did advised her to follow up on headaches with her PCP   - Unclear to me if the Ursodiol has any effect on this.   F/U in 6 months     Signed electronically by: Mack Guise, MD  [facility]  [facility] [address] [place], [place] [address] Phone: [phone] FAX: [phone]      CC: [name], FNP [address] [place] [address] Phone: [phone]  Fax: [phone]   Return to Endocrinology clinic as below: No future appointments.

## 2020-06-24 NOTE — Patient Instructions (Addendum)
-   Ergocalciferol 50,000 iu weekly     Please call [place] at [facility] at  [phone] schedule a bone density for osteoporosis.   First Floor - [facility], [place], [place], [place] [address] Hours:

## 2020-06-26 ENCOUNTER — Telehealth: Payer: Self-pay | Admitting: Family

## 2020-06-26 NOTE — Telephone Encounter (Signed)
Call pt I rec'ed a note from pts optometrist at [place] He wanted to discuss hemorrahes in eye and also labs ( including cbc, inr , pt, ESR)  Please make an appt to discuss if patient would like

## 2020-06-26 NOTE — Telephone Encounter (Signed)
Left a message to call back.

## 2020-06-28 ENCOUNTER — Other Ambulatory Visit: Payer: Self-pay

## 2020-06-28 ENCOUNTER — Ambulatory Visit
Admission: EM | Admit: 2020-06-28 | Discharge: 2020-06-28 | Disposition: A | Discharge status: Home / Self Care | Payer: 59 | Attending: Emergency Medicine | Admitting: Emergency Medicine

## 2020-06-28 DIAGNOSIS — R030 Elevated blood-pressure reading, without diagnosis of hypertension: Secondary | ICD-10-CM

## 2020-06-28 DIAGNOSIS — L239 Allergic contact dermatitis, unspecified cause: Secondary | ICD-10-CM

## 2020-06-28 MED ORDER — PREDNISONE 10 MG (21) PO TBPK: ORAL_TABLET | Freq: Every day | ORAL | 0 refills | Status: DC

## 2020-06-28 NOTE — ED Provider Notes (Signed)
[name]    CSN: [id number] Arrival date & time: 06/28/20  6160      History   Chief Complaint Chief Complaint  Patient presents with  . Poison Ivy    HPI [name] is a 54 y.o. female.   Patient presents with a pruritic rash on her legs and arms x4 days.  She states the rash started after she was weed eating and mowing her yard in shorts.  Treatment attempted at home with calamine lotion.  She denies fever, chills, sore throat, cough, shortness of breath, abdominal pain, or other symptoms.  She was seen here on 06/20/2020; diagnosed with right conjunctival hemorrhage and elevated blood pressure reading.  The history is provided by the patient.    Past Medical History:  Diagnosis Date  . Asthma due to seasonal allergies    no inhaler  . History of diabetes mellitus, type II    per pt resolved after bariatric surgery 2017  . History of hypertension    per pt resolved after bariatric surgery 2017  . History of kidney stones   . History of obstructive sleep apnea    per pt hx osa w/ cpap,  retested after bariatric surgery 2017 result negative  . History of skin cancer    nose, not melanoma  . Hydronephrosis, right   . Hyperlipidemia   . MRSA carrier   . NAFL (nonalcoholic fatty liver)   . Osteoporosis   . Renal calculi    per CT 04-18-2019 bilateral , non-obstructive  . S/P bariatric surgery 08/03/2016   duodenal switch w/ hh repair  . Ureteral calculus, right   . Wears glasses     Patient Active Problem List   Diagnosis Date Noted  . Osteomalacia 12/27/2019  . Localized osteoporosis without current pathological fracture 06/26/2019  . Hypocalcemia 06/26/2019  . Secondary hyperparathyroidism ([place]) 06/26/2019  . Multinodular goiter 06/26/2019  . Dysphagia 05/05/2019  . Osteoporosis 05/03/2019  . Memory loss 02/13/2019  . Right ureteral calculus 08/31/2017  . Hydronephrosis with urinary obstruction due to ureteral calculus 08/31/2017  .  Nephrolithiasis 08/31/2017  . Screen for colon cancer 07/26/2017  . Depression, recurrent ([place]) 06/22/2017  . Gross hematuria 11/03/2016  . Sleep apnea 10/06/2016  . Hyperlipidemia 09/21/2016  . Candidal intertrigo 09/21/2016  . H/O gastric bypass 09/21/2016  . Nocturnal leg cramps 01/21/2015  . Elevated liver enzymes 09/04/2014  . Vitamin D deficiency 09/03/2014  . Encounter for routine gynecological examination 06/18/2014  . Diabetes type 2, uncontrolled ([place]) 01/22/2014  . Routine general medical examination at a health care facility 01/11/2014  . Numbness and tingling of both legs 01/11/2014  . Knee pain, bilateral 01/11/2014  . Obesity 01/11/2014  . HTN (hypertension) 01/11/2014    Past Surgical History:  Procedure Laterality Date  . BARIATRIC SURGERY  08-03-2016    dr Darnell Level @[facility] ,     LAPAROSCOPY DUODENAL SWITCH AND HIATAL HERNIA REPAIR  . BREAST BIOPSY Right 2017   FIBROCYSTIC CHANGES WITH CALCIFICATIONS  . CESAREAN SECTION  x2  last one 09-10-2000  . COLONOSCOPY WITH PROPOFOL N/A 09/11/2019   Procedure: COLONOSCOPY WITH PROPOFOL;  Surgeon: Jonathon Bellows, MD;  Location: [facility] ENDOSCOPY;  Service: Gastroenterology;  Laterality: N/A;  . CYSTOSCOPY WITH RETROGRADE PYELOGRAM, URETEROSCOPY AND STENT PLACEMENT Right 02-07-2012  @[facility]   . CYSTOSCOPY WITH STENT PLACEMENT Right 09/10/2017   Procedure: CYSTOSCOPY WITH STENT PLACEMENT;  Surgeon: Abbie Sons, MD;  Location: [facility] ORS;  Service: Urology;  Laterality: Right;  .  CYSTOSCOPY/URETEROSCOPY/HOLMIUM LASER/STENT PLACEMENT Right 04/24/2019   Procedure: CYSTOSCOPY/URETEROSCOPY/STENT PLACEMENT/ RIGHT RETROGRADE;  Surgeon: Festus Aloe, MD;  [facility];  Service: Urology;  Laterality: Right;  . DX LAPAROSCOPY/  EGD/  HIATAL HERNIA REPAIR CLOSURE OF INTERNAL MENSENTRY DEFECT  10-28-2017   @[facility] ,  [place]  . EXTRACORPOREAL SHOCK WAVE LITHOTRIPSY N/A 01/30/2019   Procedure: EXTRACORPOREAL SHOCK WAVE  LITHOTRIPSY (ESWL) LEFT/ POSSIBLE RIGHT;  Surgeon: Festus Aloe, MD;  Location: [facility];  Service: Urology;  Laterality: N/A;  . EXTRACORPOREAL SHOCK WAVE LITHOTRIPSY Right 03/20/2019   Procedure: EXTRACORPOREAL SHOCK WAVE LITHOTRIPSY (ESWL);  Surgeon: Franchot Gallo, MD;  Location: [facility];  Service: Urology;  Laterality: Right;  . LAPAROSCOPIC CHOLECYSTECTOMY  10-15-2016   @[facility]  ,  [place]  . TONSILLECTOMY AND ADENOIDECTOMY  child  . URETEROSCOPY WITH HOLMIUM LASER LITHOTRIPSY Right 09/10/2017   Procedure: URETEROSCOPY WITH HOLMIUM LASER LITHOTRIPSY;  Surgeon: Abbie Sons, MD;  Location: [facility] ORS;  Service: Urology;  Laterality: Right;    OB History    Gravida  3   Para  2   Term      Preterm      AB  1   Living        SAB  1   TAB      Ectopic      Multiple      Live Births           Obstetric Comments  1st Menstrual Cycle:  ? 1st Pregnancy:  21         Home Medications    Prior to Admission medications   Medication Sig Start Date End Date Taking? Authorizing Provider  Calcium 500-100 MG-UNIT CHEW Chew by mouth.    [provider]  calcium-vitamin D (OSCAL WITH D) 500-200 MG-UNIT tablet Take 1 tablet by mouth.    [provider]  ergocalciferol (VITAMIN D2) 1.25 MG (50000 UT) capsule Take 1 capsule (50,000 Units total) by mouth once a week. 06/24/20   Shamleffer, Melanie Crazier, MD  NON FORMULARY Vitamin code raw calcium    [provider]  [place]     [provider]  predniSONE (STERAPRED UNI-PAK 21 TAB) 10 MG (21) TBPK tablet Take by mouth daily. As directed 06/28/20   Sharion Balloon, NP  ursodiol (ACTIGALL) 500 MG tablet Take 500 mg by mouth in the morning and at bedtime. Take 1 tablet by mouth twice daily.    [provider]    Family History Family History  Problem Relation Age of Onset  . Alcohol abuse Mother   . Hyperlipidemia Mother   . Hypertension Mother   . Lymphoma  Mother        primary site : brain  . Hyperlipidemia Father   . Hypertension Father   . Parkinson's disease Father   . Drug abuse Sister   . Cancer Maternal Grandmother        breast cancer  . Breast cancer Maternal Grandmother   . Cancer Paternal Grandmother   . Kidney cancer Neg Hx   . Prostate cancer Neg Hx     Social History Social History   Tobacco Use  . Smoking status: Never Smoker  . Smokeless tobacco: Never Used  Vaping Use  . Vaping Use: Never used  Substance Use Topics  . Alcohol use: No    Alcohol/week: 0.0 standard drinks  . Drug use: Never     Allergies   Latex, Tolmetin, Gallium nitrate, Insulin  detemir, Nsaids, and Gadolinium derivatives   Review of Systems Review of Systems  Constitutional: Negative for chills and fever.  HENT: Negative for ear pain and sore throat.   Eyes: Negative for pain and visual disturbance.  Respiratory: Negative for cough and shortness of breath.   Cardiovascular: Negative for chest pain and palpitations.  Gastrointestinal: Negative for abdominal pain and vomiting.  Genitourinary: Negative for dysuria and hematuria.  Musculoskeletal: Negative for arthralgias and back pain.  Skin: Positive for rash. Negative for color change.  Neurological: Negative for seizures, syncope, weakness and numbness.  All other systems reviewed and are negative.    Physical Exam Triage Vital Signs ED Triage Vitals  Enc Vitals Group     BP      Pulse      Resp      Temp      Temp src      SpO2      Weight      Height      Head Circumference      Peak Flow      Pain Score      Pain Loc      Pain Edu?      Excl. in [place]?    No data found.  Updated Vital Signs BP (!) 166/92 (BP Location: Left Arm)   Pulse 70   Temp 98.3 F (36.8 C) (Oral)   Resp 18   LMP 07/11/2016 (Exact Date)   SpO2 96%   Visual Acuity Right Eye Distance:   Left Eye Distance:   Bilateral Distance:    Right Eye Near:   Left Eye Near:    Bilateral  Near:     Physical Exam Vitals and nursing note reviewed.  Constitutional:      General: She is not in acute distress.    Appearance: She is well-developed.  HENT:     Head: Normocephalic and atraumatic.     Mouth/Throat:     Mouth: Mucous membranes are moist.  Eyes:     Conjunctiva/sclera: Conjunctivae normal.  Cardiovascular:     Rate and Rhythm: Normal rate and regular rhythm.     Heart sounds: No murmur heard.   Pulmonary:     Effort: Pulmonary effort is normal. No respiratory distress.     Breath sounds: Normal breath sounds.  Abdominal:     Palpations: Abdomen is soft.     Tenderness: There is no abdominal tenderness.  Musculoskeletal:     Cervical back: Neck supple.     Right lower leg: No edema.     Left lower leg: No edema.  Skin:    General: Skin is warm and dry.     Findings: Rash present.     Comments: Papular rash on legs and arms.  See pictures for details.    Neurological:     General: No focal deficit present.     Mental Status: She is alert and oriented to person, place, and time.     Gait: Gait normal.  Psychiatric:        Mood and Affect: Mood normal.        Behavior: Behavior normal.        UC Treatments / Results  Labs (all labs ordered are listed, but only abnormal results are displayed) Labs Reviewed - No data to display  EKG   Radiology No results found.  Procedures Procedures (including critical care time)  Medications Ordered in UC Medications - No data to display  Initial  Impression / Assessment and Plan / UC Course  I have reviewed the triage vital signs and the nursing notes.  Pertinent labs & imaging results that were available during my care of the patient were reviewed by me and considered in my medical decision making (see chart for details).   Allergic dermatitis.  Elevated blood pressure reading.  Treating with Benadryl and prednisone.  Precautions for drowsiness with Benadryl discussed with patient.  Instructed her  to follow-up with her PCP if her symptoms or not improving.  Discussed that her blood pressure is elevated and needs to be rechecked by her PCP in 2 to 4 weeks.  Patient agrees to plan of care.  Final Clinical Impressions(s) / UC Diagnoses   Final diagnoses:  Allergic dermatitis  Elevated blood pressure reading     Discharge Instructions     Take the prednisone as directed.    Take Benadryl every 6 hours as directed.  Do not drive, operate machinery, or drink alcohol with this medication as it may cause drowsiness.    Follow up with your primary care provider if your symptoms are not improving.    Your blood pressure is elevated today at 166/92.  Please have this rechecked by your primary care provider in 2-4 weeks.          ED Prescriptions    Medication Sig Dispense Auth. Provider   predniSONE (STERAPRED UNI-PAK 21 TAB) 10 MG (21) TBPK tablet Take by mouth daily. As directed 21 tablet Sharion Balloon, NP     PDMP not reviewed this encounter.   Sharion Balloon, NP 06/28/20 1007

## 2020-06-28 NOTE — Discharge Instructions (Addendum)
Take the prednisone as directed.    Take Benadryl every 6 hours as directed.  Do not drive, operate machinery, or drink alcohol with this medication as it may cause drowsiness.    Follow up with your primary care provider if your symptoms are not improving.    Your blood pressure is elevated today at 166/92.  Please have this rechecked by your primary care provider in 2-4 weeks.

## 2020-06-28 NOTE — ED Triage Notes (Signed)
Pt reports having an itching rash in legs x 4 days. Pt states the rash started after she mowed the grass on the weekend. Chamomile lotion gives no relief.

## 2020-07-01 ENCOUNTER — Telehealth: Payer: Self-pay | Admitting: Family

## 2020-07-01 NOTE — Telephone Encounter (Signed)
Patient was returning call for appt ?

## 2020-07-01 NOTE — Telephone Encounter (Signed)
I have spoken with patient & she really wanted to be seen. She has not been seen in quite sometime. She will bring labs that were ordered when patient went to UC.

## 2020-07-01 NOTE — Telephone Encounter (Signed)
LMTCB

## 2020-07-01 NOTE — Telephone Encounter (Signed)
Just FYI patient scheduled to come in to see you on Friday. She will bring a copy of labs that she had done after seeing UC. We cannot see bc they were drawn at [place].

## 2020-07-01 NOTE — Progress Notes (Signed)
Subjective:    Patient ID: [name], female    DOB: July 25, 1966, 55 y.o.   MRN: [id number]  CC: [name] is a 54 y.o. female who presents today for follow up.   HPI:  Complains of HA and conjunctival hemorrhages started at the same time, 6 months ago, unchanged.  HA occurring QOD.Sharp in nature , describes as 'ice pick.' HA will  aast for hours and if doesn't resolve on its own , she will take tylenol with some relief.  Migraines when 'younger' however after bariatric surgery migraines resolved More noticeable at night at work. Present in forehead, center. Halos 'bright light' in both eyes which is typical for prior migraine, however prior migraine was right sided. NO vision loss, numbness face, confusion.  Taking caffeine pills ( 200mg  once to twice) at night to stay awake. Sleeps well.  Drinks lots of water. No HA with valsava. [name] is not positional. HA doesn't awaken her from sleep.   No light sensitivity with HA or nausea.   No longer has osa; she doenst wear cipap anymore since bariatric surgery.   Notes that she is bruising more easily when watch on left scratches left forearm. No blood in stool, epistaxis.   No straining with BM, coughing. No nsaids, asa.   9pm- 7am shifts at [employer].   Currently on prednisone due to poison oak  Using  Ursodiol with zoloft for pruritus. Started ursodiol and zoloft 6 months. Recently increased ursodiol 6 weeks ago.  Reports normal eye exam recently with optometrist at [facility]   H/o bariatric surgery with Dr Darnell Level; on phentermine for past couple of weeks.   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   Following with Dr Kelton Pillar for multinodular goiter , pending repeat thyroid US; following osteoporosis, osteomalacia, pending repeat  DEXA  MR brain 03/15/2019. No acute reversible findings.  HISTORY:  Past Medical History:  Diagnosis Date   Asthma due to seasonal allergies    no inhaler   History of diabetes mellitus, type II    per pt resolved after bariatric surgery 2017   History of hypertension    per pt resolved after bariatric surgery 2017   History of kidney stones    History of obstructive sleep apnea    per pt hx osa w/ cpap,  retested after bariatric surgery 2017 result negative   History of skin cancer    nose, not melanoma   Hydronephrosis, right    Hyperlipidemia    MRSA carrier    NAFL (nonalcoholic fatty liver)    Osteoporosis    Renal calculi    per CT 04-18-2019 bilateral , non-obstructive   S/P bariatric surgery 08/03/2016   duodenal switch w/ hh repair   Ureteral calculus, right    Wears glasses    Past Surgical History:  Procedure Laterality Date   BARIATRIC SURGERY  08-03-2016    dr Darnell Level @[facility] ,  [place]   LAPAROSCOPY DUODENAL SWITCH AND HIATAL HERNIA REPAIR   BREAST BIOPSY Right 2017   FIBROCYSTIC CHANGES WITH CALCIFICATIONS   CESAREAN SECTION  x2  last one 09-10-2000   COLONOSCOPY WITH PROPOFOL N/A 09/11/2019   Procedure: COLONOSCOPY WITH PROPOFOL;  Surgeon: Jonathon Bellows, MD;  Location: [facility] ENDOSCOPY;  Service: Gastroenterology;  Laterality: N/A;   CYSTOSCOPY WITH RETROGRADE PYELOGRAM, URETEROSCOPY  AND STENT PLACEMENT Right 02-07-2012  @[facility]    CYSTOSCOPY WITH STENT PLACEMENT Right 09/10/2017   Procedure: CYSTOSCOPY WITH STENT PLACEMENT;  Surgeon: Abbie Sons, MD;  Location: [facility] ORS;  Service: Urology;  Laterality: Right;   CYSTOSCOPY/URETEROSCOPY/HOLMIUM LASER/STENT PLACEMENT Right 04/24/2019   Procedure: CYSTOSCOPY/URETEROSCOPY/STENT PLACEMENT/ RIGHT RETROGRADE;  Surgeon: Festus Aloe, MD;  Location: [facility];  Service: Urology;  Laterality: Right;   DX LAPAROSCOPY/  EGD/  HIATAL HERNIA REPAIR CLOSURE OF INTERNAL MENSENTRY DEFECT   10-28-2017   @[facility] ,  [place]   EXTRACORPOREAL SHOCK WAVE LITHOTRIPSY N/A 01/30/2019   Procedure: EXTRACORPOREAL SHOCK WAVE LITHOTRIPSY (ESWL) LEFT/ POSSIBLE RIGHT;  Surgeon: Festus Aloe, MD;  Location: [facility];  Service: Urology;  Laterality: N/A;   EXTRACORPOREAL SHOCK WAVE LITHOTRIPSY Right 03/20/2019   Procedure: EXTRACORPOREAL SHOCK WAVE LITHOTRIPSY (ESWL);  Surgeon: Franchot Gallo, MD;  Location: [facility];  Service: Urology;  Laterality: Right;   LAPAROSCOPIC CHOLECYSTECTOMY  10-15-2016   @[facility]  ,  [place]   TONSILLECTOMY AND ADENOIDECTOMY  child   URETEROSCOPY WITH HOLMIUM LASER LITHOTRIPSY Right 09/10/2017   Procedure: URETEROSCOPY WITH HOLMIUM LASER LITHOTRIPSY;  Surgeon: Abbie Sons, MD;  Location: [facility] ORS;  Service: Urology;  Laterality: Right;   Family History  Problem Relation Age of Onset   Alcohol abuse Mother    Hyperlipidemia Mother    Hypertension Mother    Lymphoma Mother        primary site : brain   Hyperlipidemia Father    Hypertension Father    Parkinson's disease Father    Drug abuse Sister    Cancer Maternal Grandmother        breast cancer   Breast cancer Maternal Grandmother    Cancer Paternal Grandmother    Kidney cancer Neg Hx    Prostate cancer Neg Hx     Allergies: Latex, Tolmetin, Gallium nitrate, Insulin detemir, Nsaids, and Gadolinium derivatives Current Outpatient Medications on File Prior to Visit  Medication Sig Dispense Refill   Calcium 500-100 MG-UNIT CHEW Chew by mouth.     OVER THE COUNTER MEDICATION      phentermine (ADIPEX-P) 37.5 MG tablet Take 0.5 tablets by mouth daily.     predniSONE (STERAPRED UNI-PAK 21 TAB) 10 MG (21) TBPK tablet Take by mouth daily. As directed 21 tablet 0   sertraline (ZOLOFT) 50 MG tablet Take 2 tablets by mouth daily.     ursodiol (ACTIGALL) 500 MG tablet Take 250 mg by mouth in the morning and at bedtime. Take two tablets by mouth in the morning & three tablets by mouth in the  evening.     No current facility-administered medications on file prior to visit.    Social History   Tobacco Use   Smoking status: Never Smoker   Smokeless tobacco: Never Used  Vaping Use   Vaping Use: Never used  Substance Use Topics   Alcohol use: No    Alcohol/week: 0.0 standard drinks   Drug use: Never    Review of Systems  Constitutional: Negative for chills and fever.  Eyes: Positive for redness and visual disturbance (aura). Negative for pain, discharge and itching.  Respiratory: Negative for cough.   Cardiovascular: Negative for chest pain and palpitations.  Gastrointestinal: Negative for nausea and vomiting.  Neurological: Positive for headaches. Negative for dizziness and weakness.  Psychiatric/Behavioral: The patient is not nervous/anxious.       Objective:    BP 140/80    Pulse 71    Temp 98.6 F (37  C)    Ht 5' 7.5" (1.715 m)    Wt 188 lb 6.4 oz (85.5 kg)    LMP 07/11/2016 (Exact Date)    SpO2 97%    BMI 29.07 kg/m  BP Readings from Last 3 Encounters:  07/05/20 140/80  06/28/20 (!) 166/92  06/24/20 130/80   Wt Readings from Last 3 Encounters:  07/05/20 188 lb 6.4 oz (85.5 kg)  06/24/20 188 lb 12.8 oz (85.6 kg)  12/26/19 180 lb (81.6 kg)    Physical Exam Vitals reviewed.  Constitutional:      Appearance: She is well-developed.  HENT:     Mouth/Throat:     Pharynx: Uvula midline.  Eyes:     General: Lids are normal.        Right eye: No hordeolum.        Left eye: No hordeolum.     Conjunctiva/sclera: Conjunctivae normal.     Right eye: No hemorrhage.    Left eye: No hemorrhage.    Pupils: Pupils are equal, round, and reactive to light.     Comments: Fundus normal bilaterally.   Cardiovascular:     Rate and Rhythm: Normal rate and regular rhythm.     Pulses: Normal pulses.     Heart sounds: Normal heart sounds.  Pulmonary:     Effort: Pulmonary effort is normal.     Breath sounds: Normal breath sounds. No wheezing, rhonchi or rales.    Skin:    General: Skin is warm and dry.  Neurological:     Mental Status: She is alert.     Cranial Nerves: No cranial nerve deficit.     Sensory: No sensory deficit.     Deep Tendon Reflexes:     Reflex Scores:      Bicep reflexes are 2+ on the right side and 2+ on the left side.      Patellar reflexes are 2+ on the right side and 2+ on the left side.    Comments: Grip equal and strong bilateral upper extremities. Gait strong and steady. Able to perform rapid alternating movement without difficulty.   Psychiatric:        Speech: Speech normal.        Behavior: Behavior normal.        Thought Content: Thought content normal.        Assessment & Plan:   Problem List Items Addressed This Visit      Respiratory   Sleep apnea    No longer wearing cipap after bariatric surgery. Dr Dorris Stout placed referral for re-evaluation.         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 Recent increase of ursodiol. Zoloft for pruritus. Will follow.         Other   Headache    New as of last 6 months. Discussed potential exacerbating etiologies including elevated blood pressure, sleep apnea, caffeine use, ursodiol ( side effect of HA in approx 1-10% per uptodate). Reassured by normal neurologic exam.  Unsure of relationship (resolved)conjunctival hemorrhages. We discussed  vitamin K ( normal), h/o bariatric surgery in setting h/o easy bruising.Question nutritional deficiency playing a role. We jointly agreed referral to neurology for evaluation and call to Dr Dorris Stout regarding trial of propranolol for headache prophylaxis and blood pressure medication. Patient will let me know how she is doing. Close follow up.        Relevant Medications   sertraline (ZOLOFT) 50 MG tablet  Other Relevant Orders   Ambulatory referral to Neurology       I have discontinued Vielka D. Bieda's calcium-vitamin D, NON FORMULARY, and ergocalciferol. I am also having her maintain  her OVER THE COUNTER MEDICATION, Calcium, ursodiol, predniSONE, phentermine, and sertraline.   No orders of the defined types were placed in this encounter.   Return precautions given.   Risks, benefits, and alternatives of the medications and treatment plan prescribed today were discussed, and patient expressed understanding.   Education regarding symptom management and diagnosis given to patient on AVS.  Continue to follow with [name], FNP for routine health maintenance.   [name] and I agreed with plan.   [name], FNP

## 2020-07-05 ENCOUNTER — Other Ambulatory Visit: Payer: Self-pay

## 2020-07-05 ENCOUNTER — Telehealth: Payer: Self-pay | Admitting: Family

## 2020-07-05 ENCOUNTER — Encounter: Payer: Self-pay | Admitting: Family

## 2020-07-05 ENCOUNTER — Ambulatory Visit (INDEPENDENT_AMBULATORY_CARE_PROVIDER_SITE_OTHER): Payer: 59 | Admitting: Family

## 2020-07-05 DIAGNOSIS — R519 Headache, unspecified: Secondary | ICD-10-CM | POA: Insufficient documentation

## 2020-07-05 DIAGNOSIS — G4730 Sleep apnea, unspecified: Secondary | ICD-10-CM | POA: Diagnosis not present

## 2020-07-05 DIAGNOSIS — K743 Primary biliary cirrhosis: Secondary | ICD-10-CM | POA: Diagnosis not present

## 2020-07-05 DIAGNOSIS — M2240 Chondromalacia patellae, unspecified knee: Secondary | ICD-10-CM | POA: Insufficient documentation

## 2020-07-05 NOTE — Assessment & Plan Note (Addendum)
New as of last 6 months. Discussed potential exacerbating etiologies including elevated blood pressure, sleep apnea, caffeine use, ursodiol ( side effect of HA in approx 1-10% per uptodate). Reassured by normal neurologic exam.  Unsure of relationship (resolved)conjunctival hemorrhages. We discussed  vitamin K ( normal), h/o bariatric surgery in setting h/o easy bruising.Question nutritional deficiency playing a role. We jointly agreed referral to neurology for evaluation and call to Dr Dorris Fetch regarding trial of propranolol for headache prophylaxis and blood pressure medication. Patient will let me know how she is doing. Close follow up.

## 2020-07-05 NOTE — Assessment & Plan Note (Signed)
No longer wearing cipap after bariatric surgery. Dr Dorris Fetch placed referral for re-evaluation.

## 2020-07-05 NOTE — Patient Instructions (Signed)
Referral to neurology  Let us know if you dont hear back within a week in regards to an appointment being scheduled.   We will contact GI regarding propranolol  If headache were to worsen in any way or you develop new symptoms, vision loss, let me know asap.

## 2020-07-05 NOTE — Telephone Encounter (Signed)
Call pt's [facility] :  Staci Acosta, MD  [address]  [place], [place] [address]  [phone]   Patient has been having HA's every other day for past 6 months which potentially coincides with start of  Ursodiol and zoloft. She does h/o migraines year ago.  Does Dr Dorris Fetch have reservations in setting of billary cirrhosis of starting propranolol ?   Patient has also been referred to neurology.

## 2020-07-05 NOTE — Assessment & Plan Note (Signed)
Established with Dr Dorris Fetch. Recent increase of ursodiol. Zoloft for pruritus. Will follow.

## 2020-07-05 NOTE — Telephone Encounter (Signed)
I called Dr. Orbie Hurst office & was transferred to nurse line. No one answered on nurse line, so I LM for someone to call me back in regards to patient.

## 2020-07-08 NOTE — Progress Notes (Signed)
Patient wants labs ordered, but has to be for [facility].

## 2020-07-09 ENCOUNTER — Encounter: Payer: Self-pay | Admitting: Neurology

## 2020-07-09 ENCOUNTER — Other Ambulatory Visit: Payer: Self-pay | Admitting: Family

## 2020-07-09 DIAGNOSIS — R238 Other skin changes: Secondary | ICD-10-CM

## 2020-07-09 DIAGNOSIS — R233 Spontaneous ecchymoses: Secondary | ICD-10-CM

## 2020-07-09 NOTE — Telephone Encounter (Signed)
Let pt know we have not heard from [name]'s office Do you want to call them one more time ?  Ordered [facility] labs as well let her know

## 2020-07-09 NOTE — Telephone Encounter (Signed)
I spoke with Rosie Fate form Dr. Orbie Hurst office. 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 They will call me back to let me know what he advises.

## 2020-07-09 NOTE — Telephone Encounter (Signed)
I spoke with patient & will mail lab reqs. She wanted me to let you know she has not taken the Ursodiol last two days & has not had a HA.

## 2020-07-09 NOTE — Telephone Encounter (Signed)
[name] called back from Dr. Orbie Hurst office & stated that propanolol was fine for patient to take.

## 2020-07-10 NOTE — Telephone Encounter (Signed)
LMTCB

## 2020-07-10 NOTE — Progress Notes (Signed)
I called & notified patient of this yesterday. I have mailed her reqs.

## 2020-07-10 NOTE — Telephone Encounter (Signed)
BP Readings from Last 3 Encounters:  07/05/20 140/80  06/28/20 (!) 166/92  06/24/20 130/80  HR 71 07/05/20 Call pt Let her know that dr Dorris Fetch okay with propranolol This is hard as appears her HA may be from ursodiol. Please ensure she lets him know about her HAs and stopping urodiol.  Her BP is slightly elevated which can contribute to HA and propranolol would lower BP, HR.  Advise consider to start propranolol low dose 10mg  BID  Let me know what she prefers to do

## 2020-07-11 NOTE — Telephone Encounter (Signed)
I spoke with patient & she was okay with starting the propanolol 10mg  BID. Would you like me to send? She will also make Dr. Orbie Hurst office aware that ursodiol seemed to be the culprit of why she is getting these headaches.

## 2020-07-12 MED ORDER — PROPRANOLOL HCL 10 MG PO TABS: 10.0000 mg | ORAL_TABLET | Freq: Two times a day (BID) | ORAL | 0 refills | Status: DC

## 2020-07-12 NOTE — Telephone Encounter (Signed)
Call pt I sent in propronolol Advise her it is a low dose so we may have to inc Ensure she has f/u appt scheduled as well

## 2020-07-12 NOTE — Telephone Encounter (Signed)
I spoke with patient & advised that propanolol was sent. She will let us know if she has any issues or feel med needs to be increased. She has f/u scheduled in November.

## 2020-07-25 ENCOUNTER — Ambulatory Visit
Admission: RE | Admit: 2020-07-25 | Discharge: 2020-07-25 | Disposition: A | Discharge status: Home / Self Care | Payer: 59 | Source: Ambulatory Visit | Attending: Internal Medicine | Admitting: Internal Medicine

## 2020-07-25 DIAGNOSIS — E042 Nontoxic multinodular goiter: Secondary | ICD-10-CM

## 2020-08-13 ENCOUNTER — Other Ambulatory Visit: Payer: Self-pay | Admitting: Family

## 2020-08-13 DIAGNOSIS — R519 Headache, unspecified: Secondary | ICD-10-CM

## 2020-08-19 ENCOUNTER — Encounter: Payer: Self-pay | Admitting: Neurology

## 2020-08-27 ENCOUNTER — Other Ambulatory Visit: Payer: Self-pay | Admitting: Family

## 2020-08-27 DIAGNOSIS — R519 Headache, unspecified: Secondary | ICD-10-CM

## 2020-08-31 ENCOUNTER — Encounter: Payer: Self-pay | Admitting: Internal Medicine

## 2020-08-31 DIAGNOSIS — E042 Nontoxic multinodular goiter: Secondary | ICD-10-CM

## 2020-09-09 LAB — ANTIPHOSPHOLIPID SYNDROME EVAL, BLD
APTT PPP: 24.6 s (ref 22.9–30.2)
Anticardiolipin IgG: <9 GPL U/mL (ref 0–14)
Anticardiolipin IgM: 17 MPL U/mL — ABNORMAL HIGH (ref 0–12)
Beta-2 Glyco 1 IgM: <9 GPI IgM units (ref 0–32)
Beta-2 Glyco I IgG: <9 GPI IgG units (ref 0–20)
Dilute Viper Venom Time: 35.3 s (ref 0.0–47.0)
Hexagonal Phase Phospholipid: 0 s (ref 0–11)
INR: 1 (ref 0.9–1.2)
PT: 10.2 s (ref 9.1–12.0)
Thrombin Time: 17.6 s (ref 0.0–23.0)

## 2020-09-09 LAB — PROTEIN C, TOTAL: Protein C Antigen: 110 % (ref 60–150)

## 2020-09-09 LAB — FACTOR 5 LEIDEN

## 2020-09-09 LAB — PROTEIN S ACTIVITY: Protein S Activity: 84 % (ref 63–140)

## 2020-09-09 LAB — PROTIME-INR
INR: 1 (ref 0.9–1.2)
Prothrombin Time: 10.6 s (ref 9.1–12.0)

## 2020-09-09 LAB — PROTHROMBIN GENE MUTATION

## 2020-09-09 LAB — ANTITHROMBIN PANEL
AT III AG PPP IMM-ACNC: 96 % (ref 72–124)
AntiThromb III Func: 106 % (ref 75–135)

## 2020-09-09 LAB — PROTEIN C ACTIVITY: Protein C Activity: 102 % (ref 73–180)

## 2020-09-09 LAB — CARDIOLIPIN ANTIBODIES, IGG, IGM, IGA: Anticardiolipin IgA: <9 APL U/mL (ref 0–11)

## 2020-09-09 LAB — APTT: aPTT: 26 s (ref 24–33)

## 2020-09-09 LAB — VON WILLEBRAND PANEL
Factor VIII Activity: 166 % — ABNORMAL HIGH (ref 56–140)
Von Willebrand Ag: 207 % — ABNORMAL HIGH (ref 50–200)
Von Willebrand Factor: 182 % (ref 50–200)

## 2020-09-09 LAB — COAG STUDIES INTERP REPORT

## 2020-09-11 ENCOUNTER — Other Ambulatory Visit: Payer: Self-pay | Admitting: Family

## 2020-09-11 DIAGNOSIS — R899 Unspecified abnormal finding in specimens from other organs, systems and tissues: Secondary | ICD-10-CM

## 2020-09-16 ENCOUNTER — Encounter: Payer: Self-pay | Admitting: Oncology

## 2020-09-16 ENCOUNTER — Inpatient Hospital Stay: Payer: 59 | Attending: Oncology | Admitting: Oncology

## 2020-09-16 ENCOUNTER — Other Ambulatory Visit: Payer: Self-pay

## 2020-09-16 ENCOUNTER — Inpatient Hospital Stay: Payer: 59

## 2020-09-16 VITALS — BP 177/104 | HR 61 | Temp 98.6°F | Resp 18 | Ht 67.5 in | Wt 189.9 lb

## 2020-09-16 DIAGNOSIS — Z808 Family history of malignant neoplasm of other organs or systems: Secondary | ICD-10-CM | POA: Diagnosis not present

## 2020-09-16 DIAGNOSIS — Z85828 Personal history of other malignant neoplasm of skin: Secondary | ICD-10-CM

## 2020-09-16 DIAGNOSIS — R238 Other skin changes: Secondary | ICD-10-CM

## 2020-09-16 DIAGNOSIS — Z9884 Bariatric surgery status: Secondary | ICD-10-CM | POA: Diagnosis not present

## 2020-09-16 DIAGNOSIS — R768 Other specified abnormal immunological findings in serum: Secondary | ICD-10-CM | POA: Diagnosis not present

## 2020-09-16 DIAGNOSIS — D6851 Activated protein C resistance: Secondary | ICD-10-CM | POA: Diagnosis not present

## 2020-09-16 DIAGNOSIS — Z800 Family history of malignant neoplasm of digestive organs: Secondary | ICD-10-CM | POA: Diagnosis not present

## 2020-09-16 DIAGNOSIS — Z803 Family history of malignant neoplasm of breast: Secondary | ICD-10-CM | POA: Diagnosis not present

## 2020-09-16 DIAGNOSIS — R7989 Other specified abnormal findings of blood chemistry: Secondary | ICD-10-CM

## 2020-09-16 DIAGNOSIS — Z811 Family history of alcohol abuse and dependence: Secondary | ICD-10-CM | POA: Insufficient documentation

## 2020-09-16 DIAGNOSIS — Z8349 Family history of other endocrine, nutritional and metabolic diseases: Secondary | ICD-10-CM | POA: Insufficient documentation

## 2020-09-16 DIAGNOSIS — Z79899 Other long term (current) drug therapy: Secondary | ICD-10-CM | POA: Diagnosis not present

## 2020-09-16 DIAGNOSIS — Z814 Family history of other substance abuse and dependence: Secondary | ICD-10-CM

## 2020-09-16 DIAGNOSIS — Z9049 Acquired absence of other specified parts of digestive tract: Secondary | ICD-10-CM

## 2020-09-16 DIAGNOSIS — Z886 Allergy status to analgesic agent status: Secondary | ICD-10-CM | POA: Diagnosis not present

## 2020-09-16 DIAGNOSIS — K743 Primary biliary cirrhosis: Secondary | ICD-10-CM

## 2020-09-16 DIAGNOSIS — Z8249 Family history of ischemic heart disease and other diseases of the circulatory system: Secondary | ICD-10-CM | POA: Diagnosis not present

## 2020-09-16 DIAGNOSIS — R791 Abnormal coagulation profile: Secondary | ICD-10-CM

## 2020-09-16 DIAGNOSIS — Z87442 Personal history of urinary calculi: Secondary | ICD-10-CM | POA: Diagnosis not present

## 2020-09-16 DIAGNOSIS — Z818 Family history of other mental and behavioral disorders: Secondary | ICD-10-CM

## 2020-09-16 DIAGNOSIS — D680 Von Willebrand's disease: Secondary | ICD-10-CM | POA: Insufficient documentation

## 2020-09-16 DIAGNOSIS — G4733 Obstructive sleep apnea (adult) (pediatric): Secondary | ICD-10-CM | POA: Diagnosis not present

## 2020-09-16 DIAGNOSIS — R233 Spontaneous ecchymoses: Secondary | ICD-10-CM

## 2020-09-16 NOTE — Progress Notes (Signed)
Hematology/Oncology Consult note [name] [phone] [phone]   Patient Care Team: [name], FNP as PCP - General (Family Medicine) Christene Lye, MD (General Surgery) Crecencio Mc, MD (Internal Medicine)  REFERRING PROVIDER: [name], FNP  CHIEF COMPLAINTS/REASON FOR VISIT:  Evaluation of easy bruising and abnormal blood work  HISTORY OF PRESENTING ILLNESS:   [name] is a  54 y.o.  female with PMH listed below was seen in consultation at the request of  [name], FNP  for evaluation of abnormal blood work  Patient was recently seen by primary care provider.  She has noticed easy bruising on her arms and legs.  She also showed me a picture of conjunctivae bleeding of her eye. She noticed the symptoms since about 1 year ago.  Denies any unintentional weight loss, night sweats, chills, fever.  He denies any previous history of deep vein thrombosis or family history of VTE.  Patient has had work-up done by her primary care provider including negative prothrombin gene mutation, factor V Leiden mutation.  Normal PT and PTT, normal Antithrombin, normal antiphospholipid syndrome panel except indeterminate mildly elevated cardiolipin IgM, normal protein C&S activity She was found to have elevated factor VIII activity level and von Willebrand factor antigen level. Patient presents to establish care.  Denies any unintentional weight loss, night sweats.  Fever or chills.  Easy bruising mostly on her arms and legs.  She mentioned that a massage machine causes some bruising on her back..  No bruises on her trunk or abdomen. Denies any nosebleeding, coughing up blood, blood in the stool.  She has a history of gastric bypass and takes bariatric vitamins.   Review of Systems  Constitutional: Negative for appetite change, chills, fatigue and fever.  HENT:   Negative for hearing loss and voice  change.   Eyes: Negative for eye problems.  Respiratory: Negative for chest tightness and cough.   Cardiovascular: Negative for chest pain.  Gastrointestinal: Negative for abdominal distention, abdominal pain and blood in stool.  Endocrine: Negative for hot flashes.  Genitourinary: Negative for difficulty urinating and frequency.   Musculoskeletal: Negative for arthralgias.  Skin: Negative for itching and rash.  Neurological: Negative for extremity weakness.  Hematological: Negative for adenopathy.  Psychiatric/Behavioral: Negative for confusion.    MEDICAL HISTORY:  Past Medical History:  Diagnosis Date  . Asthma due to seasonal allergies    no inhaler  . History of diabetes mellitus, type II    per pt resolved after bariatric surgery 2017  . History of hypertension    per pt resolved after bariatric surgery 2017  . History of kidney stones   . History of obstructive sleep apnea    per pt hx osa w/ cpap,  retested after bariatric surgery 2017 result negative  . History of skin cancer    nose, not melanoma  . Hydronephrosis, right   . Hyperlipidemia   . MRSA carrier   . NAFL (nonalcoholic fatty liver)   . Osteoporosis   . Renal calculi    per CT 04-18-2019 bilateral , non-obstructive  . S/P bariatric surgery 08/03/2016   duodenal switch w/ hh repair  . Ureteral calculus, right   . Wears glasses     SURGICAL HISTORY: Past Surgical History:  Procedure Laterality Date  . BARIATRIC SURGERY  08-03-2016    dr Darnell Level @[facility] ,  [place]   LAPAROSCOPY DUODENAL SWITCH AND HIATAL HERNIA REPAIR  . BREAST BIOPSY Right 2017  FIBROCYSTIC CHANGES WITH CALCIFICATIONS  . CESAREAN SECTION  x2  last one 09-10-2000  . COLONOSCOPY WITH PROPOFOL N/A 09/11/2019   Procedure: COLONOSCOPY WITH PROPOFOL;  Surgeon: Jonathon Bellows, MD;  Location: [facility] ENDOSCOPY;  Service: Gastroenterology;  Laterality: N/A;  . CYSTOSCOPY WITH RETROGRADE PYELOGRAM, URETEROSCOPY AND STENT PLACEMENT Right 02-07-2012  @[facility]    . CYSTOSCOPY WITH STENT PLACEMENT Right 09/10/2017   Procedure: CYSTOSCOPY WITH STENT PLACEMENT;  Surgeon: Abbie Sons, MD;  Location: [facility] ORS;  Service: Urology;  Laterality: Right;  . CYSTOSCOPY/URETEROSCOPY/HOLMIUM LASER/STENT PLACEMENT Right 04/24/2019   Procedure: CYSTOSCOPY/URETEROSCOPY/STENT PLACEMENT/ RIGHT RETROGRADE;  Surgeon: Festus Aloe, MD;  Location: [facility];  Service: Urology;  Laterality: Right;  . DX LAPAROSCOPY/  EGD/  HIATAL HERNIA REPAIR CLOSURE OF INTERNAL MENSENTRY DEFECT  10-28-2017   @[facility] ,  [place]  . EXTRACORPOREAL SHOCK WAVE LITHOTRIPSY N/A 01/30/2019   Procedure: EXTRACORPOREAL SHOCK WAVE LITHOTRIPSY (ESWL) LEFT/ POSSIBLE RIGHT;  Surgeon: Festus Aloe, MD;  Location: [facility] ORS;  Service: Urology;  Laterality: N/A;  . EXTRACORPOREAL SHOCK WAVE LITHOTRIPSY Right 03/20/2019   Procedure: EXTRACORPOREAL SHOCK WAVE LITHOTRIPSY (ESWL);  Surgeon: Franchot Gallo, MD;  Location: [facility] ORS;  Service: Urology;  Laterality: Right;  . LAPAROSCOPIC CHOLECYSTECTOMY  10-15-2016   @[facility]  ,  [place]  . TONSILLECTOMY AND ADENOIDECTOMY  child  . URETEROSCOPY WITH HOLMIUM LASER LITHOTRIPSY Right 09/10/2017   Procedure: URETEROSCOPY WITH HOLMIUM LASER LITHOTRIPSY;  Surgeon: Abbie Sons, MD;  Location: [facility] ORS;  Service: Urology;  Laterality: Right;    SOCIAL HISTORY: Social History   Socioeconomic History  . Marital status: Single    Spouse name: Not on file  . Number of children: 2  . Years of education: 50  . Highest education level: Not on file  Occupational History  . Occupation: Mudlogger: [employer]  . 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 Occupation: Engineer, materials  . Occupation: [employer]    Comment: Microbiology - Weekend  Tobacco Use  . Smoking status: Never Smoker  . Smokeless tobacco: Never Used  Vaping Use  . Vaping Use: Never used  Substance and Sexual Activity  .  Alcohol use: No    Alcohol/week: 0.0 standard drinks  . Drug use: Never  . Sexual activity: Yes    Birth control/protection: None, Post-menopausal  Other Topics Concern  . Not on file  Social History Narrative   [name] grew up partly in [place] and then [place]. She lives in [place] with her two daughter. 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 She enjoys the outdoors, gardening.    Social Determinants of Health   Financial Resource Strain:   . Difficulty of Paying Living Expenses: Not on file  Food Insecurity:   . Worried About Charity fundraiser in the Last Year: Not on file  . Ran Out of Food in the Last Year: Not on file  Transportation Needs:   . Lack of Transportation (Medical): Not on file  . Lack of Transportation (Non-Medical): Not on file  Physical Activity:   . Days of Exercise per Week: Not on file  . Minutes of Exercise per Session: Not on file  Stress:   . Feeling of Stress : Not on file  Social Connections:   . Frequency of Communication with Friends and Family: Not on file  . Frequency of Social Gatherings with Friends and Family: Not on file  . Attends Religious Services:  Not on file  . Active Member of Clubs or Organizations: Not on file  . Attends Archivist Meetings: Not on file  . Marital Status: Not on file  Intimate Partner Violence:   . Fear of Current or Ex-Partner: Not on file  . Emotionally Abused: Not on file  . Physically Abused: Not on file  . Sexually Abused: Not on file    FAMILY HISTORY: Family History  Problem Relation Age of Onset  . Alcohol abuse Mother   . Hyperlipidemia Mother   . Hypertension Mother   . Lymphoma Mother        primary site : brain  . Hyperlipidemia Father   . Hypertension Father   . Parkinson's disease Father   . Drug abuse Sister   . Cancer Maternal Grandmother        breast cancer  . Breast cancer Maternal Grandmother   . Liver cancer Paternal Grandfather   . Kidney  cancer Neg Hx   . Prostate cancer Neg Hx     ALLERGIES:  is allergic to latex, tolmetin, gallium nitrate, insulin detemir, nsaids, and gadolinium derivatives.  MEDICATIONS:  Current Outpatient Medications  Medication Sig Dispense Refill  . OVER THE COUNTER MEDICATION     . phentermine (ADIPEX-P) 37.5 MG tablet Take 0.5 tablets by mouth daily.    . sertraline (ZOLOFT) 50 MG tablet Take 2 tablets by mouth daily.    . ursodiol (ACTIGALL) 500 MG tablet Take 250 mg by mouth in the morning and at bedtime. Take two tablets by mouth in the morning & three tablets by mouth in the evening.    . Vitamin D, Ergocalciferol, (DRISDOL) 1.25 MG (50000 UNIT) CAPS capsule Take 50,000 Units by mouth once a week.    Marland Kitchen PREVIDENT 5000 BOOSTER PLUS 1.1 % PSTE Place onto teeth as directed.    . propranolol (INDERAL) 10 MG tablet TAKE 1 TABLET BY MOUTH TWICE A DAY (Patient not taking: Reported on 09/16/2020) 180 tablet 1   No current facility-administered medications for this visit.     PHYSICAL EXAMINATION: ECOG PERFORMANCE STATUS: 1 - Symptomatic but completely ambulatory Vitals:   09/16/20 1048  BP: (!) 177/104  Pulse: 61  Resp: 18  Temp: 98.6 F (37 C)   Filed Weights   09/16/20 1048  Weight: 189 lb 14.4 oz (86.1 kg)    Physical Exam Constitutional:      General: She is not in acute distress. HENT:     Head: Normocephalic and atraumatic.  Eyes:     General: No scleral icterus. Cardiovascular:     Rate and Rhythm: Normal rate and regular rhythm.     Heart sounds: Normal heart sounds.  Pulmonary:     Effort: Pulmonary effort is normal. No respiratory distress.     Breath sounds: No wheezing.  Abdominal:     General: Bowel sounds are normal. There is no distension.     Palpations: Abdomen is soft.  Musculoskeletal:        General: No deformity. Normal range of motion.     Cervical back: Normal range of motion and neck supple.  Skin:    General: Skin is warm and dry.     Findings: No  erythema or rash.  Neurological:     Mental Status: She is alert and oriented to person, place, and time. Mental status is at baseline.     Cranial Nerves: No cranial nerve deficit.     Coordination: Coordination normal.  Psychiatric:  Mood and Affect: Mood normal.     LABORATORY DATA:  I have reviewed the data as listed Lab Results  Component Value Date   WBC 5.1 09/13/2019   HGB 11.7 (L) 09/13/2019   HCT 36.4 09/13/2019   MCV 91.7 09/13/2019   PLT 168 09/13/2019   No results for input(s): NA, K, CL, CO2, GLUCOSE, BUN, CREATININE, CALCIUM, GFRNONAA, GFRAA, PROT, ALBUMIN, AST, ALT, ALKPHOS, BILITOT, BILIDIR, IBILI in the last 8760 hours. Iron/TIBC/Ferritin/ %Sat    Component Value Date/Time   IRON 58 03/07/2019 1040   TIBC 330 03/07/2019 1040   FERRITIN 36 03/07/2019 1040   IRONPCTSAT 18 03/07/2019 1040   IRONPCTSAT 13 (L) 09/10/2014 1354      RADIOGRAPHIC STUDIES: I have personally reviewed the radiological images as listed and agreed with the findings in the report. No results found.    ASSESSMENT & PLAN:  1. Easy bruising   2. Primary biliary cirrhosis (HCC)   3. Elevated factor VIII level    Labs reviewed and discussed with patient. Abnormal blood work Increased factor VIII level and von Willebrand antigen level are not diagnostic for any condition.  Usually levels can be nonspecifically elevated due to acute on chronic inflammation, medication and if the level is persistently elevated, it can be an independent factor of increased VTE risk.  However this is not contributing to her symptoms of easy bruising.  And patient has never had any personal history of VTE or family history of VTE. I will hold off additional work-up at this point.  Mildly elevated Cardiolipin IgM, 17, not meeting antiphospholipid syndrome diagnostic criteria.-This also would not explain her easy bruising symptoms.  Easy bruising, Likely acquired given that patient denies any previous  history of bruising or bleeding since childhood or from previous major surgeries.  She did mention some excessive bleeding after tongue piercing procedure recently. She has a normal PT and PTT I will check vitamin C level, platelet function assay, vitamin K1 level, zinc, copper.  If above work-up is normal I recommend observation. Patient uses [facility] for labs prescription were provided to her. All questions were answered. The patient knows to call the clinic with any problems questions or concerns.  cc [name], FNP   Thank you for this kind referral and the opportunity to participate in the care of this patient. A copy of today's note is routed to referring provider    Earlie Server, MD, PhD Hematology Oncology [facility] Pager- [phone] 09/16/2020

## 2020-09-16 NOTE — Progress Notes (Signed)
Pt reports that she bleeds easy and gets easy blood marks on skin. Pt's blood pressure is elevated, she reports being very anxious today

## 2020-09-18 ENCOUNTER — Ambulatory Visit
Admission: RE | Admit: 2020-09-18 | Discharge: 2020-09-18 | Disposition: A | Discharge status: Home / Self Care | Payer: 59 | Source: Ambulatory Visit | Attending: Internal Medicine | Admitting: Internal Medicine

## 2020-09-18 ENCOUNTER — Other Ambulatory Visit (HOSPITAL_COMMUNITY)
Admission: RE | Admit: 2020-09-18 | Discharge: 2020-09-18 | Disposition: A | Discharge status: Home / Self Care | Payer: 59 | Source: Ambulatory Visit | Attending: Internal Medicine | Admitting: Internal Medicine

## 2020-09-18 DIAGNOSIS — D34 Benign neoplasm of thyroid gland: Secondary | ICD-10-CM | POA: Diagnosis not present

## 2020-09-18 DIAGNOSIS — E042 Nontoxic multinodular goiter: Secondary | ICD-10-CM

## 2020-09-18 DIAGNOSIS — E041 Nontoxic single thyroid nodule: Secondary | ICD-10-CM | POA: Diagnosis present

## 2020-09-20 LAB — CYTOLOGY - NON PAP

## 2020-09-22 NOTE — Progress Notes (Signed)
NEUROLOGY CONSULTATION NOTE  [name] MRN: [id number] DOB: [birth year]  Referring provider: [name], FNP Primary care provider: [name], FNP  Reason for consult:  headaches   Subjective:  [name] is a 54 year old right-handed female who presents for headaches.  History supplemented by referring provider's note.  About 6 months ago, she began having ocular pain, described as severe retro-orbital pressure that builds up and then pops, developing a subconjunctival hemorrhage.  The pressure lasts 30 to 30 minutes and the hemorrhage resolves in a week.  No associated ptosis, eye lacrimation, rhinorrhea or nasal congestion.  She has had 4 episodes in the past 6 months.  All but one have occurred in the left eye.   She saw an optometrist who said everything looked fine.  She has history of migraines, but these are different.  Migraines are preceded by visual aura with halo that becomes so bright, she loses vision.  Headache is a sharp pain across the forehead with photophobia.  They occur about twice a month.   Current medications:  Sertraline 100mg  daily. Past medications:  Propranolol 10mg  BID, sumatriptan, Cafergot  MRI of brain with and without contrast from 03/15/2019 to assess memory problems was personally reviewed and was normal.  PAST MEDICAL HISTORY: Past Medical History:  Diagnosis Date  . Asthma due to seasonal allergies    no inhaler  . History of diabetes mellitus, type II    per pt resolved after bariatric surgery 2017  . History of hypertension    per pt resolved after bariatric surgery 2017  . History of kidney stones   . History of obstructive sleep apnea    per pt hx osa w/ cpap,  retested after bariatric surgery 2017 result negative  . History of skin cancer    nose, not melanoma  . Hydronephrosis, right   . Hyperlipidemia   . MRSA carrier   . NAFL (nonalcoholic fatty liver)   . Osteoporosis   . Renal calculi    per CT  04-18-2019 bilateral , non-obstructive  . S/P bariatric surgery 08/03/2016   duodenal switch w/ hh repair  . Ureteral calculus, right   . Wears glasses     PAST SURGICAL HISTORY: Past Surgical History:  Procedure Laterality Date  . BARIATRIC SURGERY  08-03-2016    dr Darnell Level @[facility] ,  [place]   LAPAROSCOPY DUODENAL SWITCH AND HIATAL HERNIA REPAIR  . BREAST BIOPSY Right 2017   FIBROCYSTIC CHANGES WITH CALCIFICATIONS  . CESAREAN SECTION  x2  last one 09-10-2000  . COLONOSCOPY WITH PROPOFOL N/A 09/11/2019   Procedure: COLONOSCOPY WITH PROPOFOL;  Surgeon: Jonathon Bellows, MD;  Location: [facility] ENDOSCOPY;  Service: Gastroenterology;  Laterality: N/A;  . CYSTOSCOPY WITH RETROGRADE PYELOGRAM, URETEROSCOPY AND STENT PLACEMENT Right 02-07-2012  @[facility]   . CYSTOSCOPY WITH STENT PLACEMENT Right 09/10/2017   Procedure: CYSTOSCOPY WITH STENT PLACEMENT;  Surgeon: Abbie Sons, MD;  Location: [facility] ORS;  Service: Urology;  Laterality: Right;  . CYSTOSCOPY/URETEROSCOPY/HOLMIUM LASER/STENT PLACEMENT Right 04/24/2019   Procedure: CYSTOSCOPY/URETEROSCOPY/STENT PLACEMENT/ RIGHT RETROGRADE;  Surgeon: Festus Aloe, MD;  Location: [facility];  Service: Urology;  Laterality: Right;  . DX LAPAROSCOPY/  EGD/  HIATAL HERNIA REPAIR CLOSURE OF INTERNAL MENSENTRY DEFECT  10-28-2017   @[facility] ,  [place]  . EXTRACORPOREAL SHOCK WAVE LITHOTRIPSY N/A 01/30/2019   Procedure: EXTRACORPOREAL SHOCK WAVE LITHOTRIPSY (ESWL) LEFT/ POSSIBLE RIGHT;  Surgeon: Festus Aloe, MD;  Location: [facility] ORS;  Service: Urology;  Laterality: N/A;  . EXTRACORPOREAL SHOCK  WAVE LITHOTRIPSY Right 03/20/2019   Procedure: EXTRACORPOREAL SHOCK WAVE LITHOTRIPSY (ESWL);  Surgeon: Franchot Gallo, MD;  Location: [facility] ORS;  Service: Urology;  Laterality: Right;  . LAPAROSCOPIC CHOLECYSTECTOMY  10-15-2016   @[facility]  ,  [place]  . TONSILLECTOMY AND ADENOIDECTOMY  child  . URETEROSCOPY WITH HOLMIUM LASER LITHOTRIPSY Right 09/10/2017   Procedure:  URETEROSCOPY WITH HOLMIUM LASER LITHOTRIPSY;  Surgeon: Abbie Sons, MD;  Location: [facility] ORS;  Service: Urology;  Laterality: Right;    MEDICATIONS: Current Outpatient Medications on File Prior to Visit  Medication Sig Dispense Refill  . OVER THE COUNTER MEDICATION     . phentermine (ADIPEX-P) 37.5 MG tablet Take 0.5 tablets by mouth daily.    Marland Kitchen PREVIDENT 5000 BOOSTER PLUS 1.1 % PSTE Place onto teeth as directed.    . propranolol (INDERAL) 10 MG tablet TAKE 1 TABLET BY MOUTH TWICE A DAY (Patient not taking: Reported on 09/16/2020) 180 tablet 1  . sertraline (ZOLOFT) 50 MG tablet Take 2 tablets by mouth daily.    . ursodiol (ACTIGALL) 500 MG tablet Take 250 mg by mouth in the morning and at bedtime. Take two tablets by mouth in the morning & three tablets by mouth in the evening.    . Vitamin D, Ergocalciferol, (DRISDOL) 1.25 MG (50000 UNIT) CAPS capsule Take 50,000 Units by mouth once a week.     No current facility-administered medications on file prior to visit.    ALLERGIES: Allergies  Allergen Reactions  . Latex Shortness Of Breath and Cough  . Tolmetin Hives  . Gallium Nitrate Hives    AND Gadolinium Derivatives   . Insulin Detemir Hives and Swelling    Lump at injection site  . Nsaids Hives    Per pt stated has to take ibuprofen with a antihistimine  . Gadolinium Derivatives Hives    Pt c/o itching after contrast injection 03/15/19 @ 8:45am. [facility] Per Dr. Kathlene Cote document this as allergic reaction.     FAMILY HISTORY: Family History  Problem Relation Age of Onset  . Alcohol abuse Mother   . Hyperlipidemia Mother   . Hypertension Mother   . Lymphoma Mother        primary site : brain  . Hyperlipidemia Father   . Hypertension Father   . Parkinson's disease Father   . Drug abuse Sister   . Cancer Maternal Grandmother        breast cancer  . Breast cancer Maternal Grandmother   . Liver cancer Paternal Grandfather   . Kidney cancer Neg Hx   . Prostate cancer Neg Hx      SOCIAL HISTORY: Social History   Socioeconomic History  . Marital status: Single    Spouse name: Not on file  . Number of children: 2  . Years of education: 38  . Highest education level: Not on file  Occupational History  . Occupation: Mudlogger: [employer]  . 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 Occupation: Engineer, materials  . Occupation: [employer]    Comment: Microbiology - Weekend  Tobacco Use  . Smoking status: Never Smoker  . Smokeless tobacco: Never Used  Vaping Use  . Vaping Use: Never used  Substance and Sexual Activity  . Alcohol use: No    Alcohol/week: 0.0 standard drinks  . Drug use: Never  . Sexual activity: Yes    Birth control/protection: None, Post-menopausal  Other Topics Concern  . Not on file  Social History Narrative   [name] grew up partly in [place] and then [place]. She lives in [place] with her two daughter. [place]ai works in the microbiology department at [employer]. She enjoys the outdoors, gardening.    Social Determinants of Health   Financial Resource Strain:   . Difficulty of Paying Living Expenses: Not on file  Food Insecurity:   . Worried About Charity fundraiser in the Last Year: Not on file  . Ran Out of Food in the Last Year: Not on file  Transportation Needs:   . Lack of Transportation (Medical): Not on file  . Lack of Transportation (Non-Medical): Not on file  Physical Activity:   . Days of Exercise per Week: Not on file  . Minutes of Exercise per Session: Not on file  Stress:   . Feeling of Stress : Not on file  Social Connections:   . Frequency of Communication with Friends and Family: Not on file  . Frequency of Social Gatherings with Friends and Family: Not on file  . Attends Religious Services: Not on file  . Active Member of Clubs or Organizations: Not on file  . Attends Archivist Meetings: Not on file  . Marital Status: Not on file    Intimate Partner Violence:   . Fear of Current or Ex-Partner: Not on file  . Emotionally Abused: Not on file  . Physically Abused: Not on file  . Sexually Abused: Not on file    Objective:  Blood pressure (!) 178/103, pulse 86, resp. rate 20, height 5\' 8"  (1.727 m), weight 190 lb (86.2 kg), last menstrual period 07/11/2016, SpO2 97 %. General: No acute distress.  Patient appears well-groomed.   Head:  Normocephalic/atraumatic Eyes:  fundi examined but not visualized Neck: supple, no paraspinal tenderness, full range of motion Back: No paraspinal tenderness Heart: regular rate and rhythm Lungs: Clear to auscultation bilaterally. Vascular: No carotid bruits. Neurological Exam: Mental status: alert and oriented to person, place, and time, recent and remote memory intact, fund of knowledge intact, attention and concentration intact, speech fluent and not dysarthric, language intact. Cranial nerves: CN I: not tested CN II: pupils equal, round and reactive to light, visual fields intact CN III, IV, VI:  full range of motion, no nystagmus, no ptosis CN V: facial sensation intact. CN VII: upper and lower face symmetric CN VIII: hearing intact CN IX, X: gag intact, uvula midline CN XI: sternocleidomastoid and trapezius muscles intact CN XII: tongue midline Bulk & Tone: normal, no fasciculations. Motor:  muscle strength 5/5 throughout Sensation:  Pinprick, temperature and vibratory sensation intact. Deep Tendon Reflexes:  2+ throughout,  toes downgoing.   Finger to nose testing:  Without dysmetria.   Heel to shin:  Without dysmetria.   Gait:  Normal station and stride.  Romberg negative.  Assessment/Plan:   Recurrent episodes of ocular pain with subconjunctival injection.  Does not seem like migraine or cluster headache.  Elevated blood pressure -  She attributes it to not sleeping for 24 hours.  Will recheck at home.  1.  Will get MRI of brain and orbits with and without contrast.   Further recommendations pending results. 2.  As they are infrequent, would not start a preventative medication. 3.  If she should have another episode, I would recommend evaluation by an ophthalmologist.    Thank you for allowing me to take part in the care of this patient.  Metta Clines, DO  CC: [name], FNP

## 2020-09-23 ENCOUNTER — Ambulatory Visit: Payer: 59 | Admitting: Neurology

## 2020-09-23 ENCOUNTER — Encounter: Payer: Self-pay | Admitting: Neurology

## 2020-09-23 ENCOUNTER — Other Ambulatory Visit: Payer: Self-pay

## 2020-09-23 VITALS — BP 178/103 | HR 86 | Resp 20 | Ht 68.0 in | Wt 190.0 lb

## 2020-09-23 DIAGNOSIS — H5712 Ocular pain, left eye: Secondary | ICD-10-CM

## 2020-09-23 DIAGNOSIS — R030 Elevated blood-pressure reading, without diagnosis of hypertension: Secondary | ICD-10-CM | POA: Diagnosis not present

## 2020-09-23 DIAGNOSIS — H1132 Conjunctival hemorrhage, left eye: Secondary | ICD-10-CM | POA: Diagnosis not present

## 2020-09-23 NOTE — Patient Instructions (Signed)
1. Check MRI of brain and orbits with and without contrast 2.  Further recommendations pending results.

## 2020-09-24 ENCOUNTER — Ambulatory Visit: Payer: 59 | Admitting: Neurology

## 2020-10-07 ENCOUNTER — Ambulatory Visit: Payer: 59 | Admitting: Family

## 2020-10-08 ENCOUNTER — Ambulatory Visit: Admission: RE | Admit: 2020-10-08 | Payer: 59 | Source: Ambulatory Visit

## 2020-10-08 ENCOUNTER — Ambulatory Visit: Payer: 59

## 2020-10-17 ENCOUNTER — Other Ambulatory Visit: Payer: Self-pay | Admitting: Family

## 2020-10-17 DIAGNOSIS — Z1231 Encounter for screening mammogram for malignant neoplasm of breast: Secondary | ICD-10-CM

## 2020-11-06 ENCOUNTER — Ambulatory Visit
Admission: RE | Admit: 2020-11-06 | Discharge: 2020-11-06 | Disposition: A | Discharge status: Home / Self Care | Payer: 59 | Source: Ambulatory Visit | Attending: Family | Admitting: Family

## 2020-11-06 ENCOUNTER — Other Ambulatory Visit: Payer: Self-pay

## 2020-11-06 ENCOUNTER — Ambulatory Visit
Admission: RE | Admit: 2020-11-06 | Discharge: 2020-11-06 | Disposition: A | Discharge status: Home / Self Care | Payer: 59 | Source: Ambulatory Visit | Attending: Internal Medicine | Admitting: Internal Medicine

## 2020-11-06 DIAGNOSIS — Z1231 Encounter for screening mammogram for malignant neoplasm of breast: Secondary | ICD-10-CM | POA: Diagnosis present

## 2020-11-06 DIAGNOSIS — M810 Age-related osteoporosis without current pathological fracture: Secondary | ICD-10-CM | POA: Insufficient documentation

## 2020-11-07 ENCOUNTER — Encounter: Payer: Self-pay | Admitting: Internal Medicine

## 2020-12-03 ENCOUNTER — Other Ambulatory Visit: Payer: Self-pay | Admitting: Internal Medicine

## 2020-12-09 ENCOUNTER — Other Ambulatory Visit: Payer: Self-pay

## 2020-12-09 ENCOUNTER — Ambulatory Visit: Payer: 59 | Admitting: Family

## 2020-12-09 ENCOUNTER — Encounter: Payer: Self-pay | Admitting: Family

## 2020-12-09 ENCOUNTER — Telehealth: Payer: Self-pay | Admitting: Family

## 2020-12-09 DIAGNOSIS — G8929 Other chronic pain: Secondary | ICD-10-CM | POA: Diagnosis not present

## 2020-12-09 DIAGNOSIS — M545 Low back pain, unspecified: Secondary | ICD-10-CM | POA: Insufficient documentation

## 2020-12-09 DIAGNOSIS — R1013 Epigastric pain: Secondary | ICD-10-CM

## 2020-12-09 NOTE — Telephone Encounter (Signed)
Called surgeons office and they stated will call back with advice but feels patient should call and schedule follow up concerning for Epi -gastric pain and the time lapse this has been happening from surgery? Office will return call to advise on Protonix or Pepcid [facility]

## 2020-12-09 NOTE — Assessment & Plan Note (Signed)
Chronic. H/o osteoporosis. Pending Xrays. Back pain unlikely related to epigastric pain.

## 2020-12-09 NOTE — Telephone Encounter (Signed)
Call Dr Kreg Shropshire [phone]  This is who performed her bariatric surgery Please inform nurse that patient has been having epigastric pain since surgery, worse past several months I am not sure if related to duodenal switch , h/o hiatal hernia, or GERD.   Please ask if safe for patient to take Pepcid AC and Protonix?  Patient is also due for f/u with dr Darnell Level. Please sch appt; she prefer late morning or early afternoon  Please let me know the above and also call pat to inform her.

## 2020-12-09 NOTE — Progress Notes (Signed)
Subjective:    Patient ID: [name], female    DOB: Nov 13, 1965, 55 y.o.   MRN: [id number]  CC: [name] is a 55 y.o. female who presents today for an acute visit.    HPI: CC: abdominal pain for 3-4 years.  pain has become more intense and more frequent for the last 6 months.  Center epigastric region. Feels twisting, cramping. Pain is intermittent and she cannot tell particular pattern.  At first thought related to constipation however states having a BM daily.  She is not drinking soda nor eating fast foods with some lessened in intensity of pain. She is worried that her stomach has stretched since bariatric surgery.  Pain is not related to eating, feels more random. She is currently intermittently fasting and no changes in pain.  No fever, vomiting, nausea, diarrhea, epigastric burning, coughing, sob.   She discussed this with Dr Darnell Level in the past and had a hiatal repair for the same reason. After hiatal hernia repair, abdominal pain didn't change.   She has checked urine at work ( [employer]) and states no blood.   Has increase calcium, vitamin D, water intake to avoid recurrence of stone. No flank pain.  Chronic low back pain. No numbness in legs, saddle anesthesia.   She has never been on PPI nor H2B.       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 Referred from Dr Vicente Males in 2020.  Compliant with ursodiol, zoloft ( for itching). Follow up due in July and she plans to make this appointment.   H/o renal stone and lithotripsy, NAFLD, skin cancer, DM, cholecystectomy, hiatal hernia repair 2018, bariatric surgery 2017  No nsaids or alcohol.   Osteoporosis- upcoming appointment with Dr Kelton Pillar 12/26/20   Duodenal switch- Dr Darnell Level 2017  Dr Tomi Likens- never had MR brain, MR orbits as symptom has only recurred one time. She declines further follow up with him at this time.   Colonoscopy 2020 EGD 04/2016   HISTORY:  Past Medical History:  Diagnosis Date  . Asthma due  to seasonal allergies    no inhaler  . History of diabetes mellitus, type II    per pt resolved after bariatric surgery 2017  . History of hypertension    per pt resolved after bariatric surgery 2017  . History of kidney stones   . History of obstructive sleep apnea    per pt hx osa w/ cpap,  retested after bariatric surgery 2017 result negative  . History of skin cancer    nose, not melanoma  . Hydronephrosis, right   . Hyperlipidemia   . MRSA carrier   . NAFL (nonalcoholic fatty liver)   . Osteoporosis   . Renal calculi    per CT 04-18-2019 bilateral , non-obstructive  . S/P bariatric surgery 08/03/2016   duodenal switch w/ hh repair  . Ureteral calculus, right   . Wears glasses    Past Surgical History:  Procedure Laterality Date  . BARIATRIC SURGERY  08-03-2016    dr Darnell Level @[facility] ,  [place]   LAPAROSCOPY DUODENAL SWITCH AND HIATAL HERNIA REPAIR  . BREAST BIOPSY Right 2017   FIBROCYSTIC CHANGES WITH CALCIFICATIONS  . CESAREAN SECTION  x2  last one 09-10-2000  . COLONOSCOPY WITH PROPOFOL N/A 09/11/2019   Procedure: COLONOSCOPY WITH PROPOFOL;  Surgeon: Jonathon Bellows, MD;  Location: [facility] ENDOSCOPY;  Service: Gastroenterology;  Laterality: N/A;  . CYSTOSCOPY WITH RETROGRADE PYELOGRAM, URETEROSCOPY AND STENT PLACEMENT Right 02-07-2012  @[facility]   . CYSTOSCOPY WITH  STENT PLACEMENT Right 09/10/2017   Procedure: CYSTOSCOPY WITH STENT PLACEMENT;  Surgeon: Abbie Sons, MD;  Location: [facility] ORS;  Service: Urology;  Laterality: Right;  . CYSTOSCOPY/URETEROSCOPY/HOLMIUM LASER/STENT PLACEMENT Right 04/24/2019   Procedure: CYSTOSCOPY/URETEROSCOPY/STENT PLACEMENT/ RIGHT RETROGRADE;  Surgeon: Festus Aloe, MD;  [facility];  Service: Urology;  Laterality: Right;  . DX LAPAROSCOPY/  EGD/  HIATAL HERNIA REPAIR CLOSURE OF INTERNAL MENSENTRY DEFECT  10-28-2017   @[facility] ,  [place]  . EXTRACORPOREAL SHOCK WAVE LITHOTRIPSY N/A 01/30/2019   Procedure: EXTRACORPOREAL SHOCK WAVE  LITHOTRIPSY (ESWL) LEFT/ POSSIBLE RIGHT;  Surgeon: Festus Aloe, MD;  Location: [facility] ORS;  Service: Urology;  Laterality: N/A;  . EXTRACORPOREAL SHOCK WAVE LITHOTRIPSY Right 03/20/2019   Procedure: EXTRACORPOREAL SHOCK WAVE LITHOTRIPSY (ESWL);  Surgeon: Franchot Gallo, MD;  Location: [facility] ORS;  Service: Urology;  Laterality: Right;  . LAPAROSCOPIC CHOLECYSTECTOMY  10-15-2016   @[facility]  ,  [place]  . TONSILLECTOMY AND ADENOIDECTOMY  child  . URETEROSCOPY WITH HOLMIUM LASER LITHOTRIPSY Right 09/10/2017   Procedure: URETEROSCOPY WITH HOLMIUM LASER LITHOTRIPSY;  Surgeon: Abbie Sons, MD;  Location: [facility] ORS;  Service: Urology;  Laterality: Right;   Family History  Problem Relation Age of Onset  . Alcohol abuse Mother   . Hyperlipidemia Mother   . Hypertension Mother   . Lymphoma Mother        primary site : brain  . Hyperlipidemia Father   . Hypertension Father   . Parkinson's disease Father   . Drug abuse Sister   . Cancer Maternal Grandmother        breast cancer  . Breast cancer Maternal Grandmother   . Liver cancer Paternal Grandfather   . Kidney cancer Neg Hx   . Prostate cancer Neg Hx     Allergies: Latex, Tolmetin, Gallium nitrate, Insulin detemir, Nsaids, and Gadolinium derivatives Current Outpatient Medications on File Prior to Visit  Medication Sig Dispense Refill  . Multiple Vitamins-Minerals (BARIATRIC MULTIVITAMINS/IRON PO) Take by mouth.    . phentermine (ADIPEX-P) 37.5 MG tablet Take 0.5 tablets by mouth daily.    Marland Kitchen PREVIDENT 5000 BOOSTER PLUS 1.1 % PSTE Place onto teeth as directed.    . sertraline (ZOLOFT) 50 MG tablet Take 2 tablets by mouth daily.    . ursodiol (ACTIGALL) 500 MG tablet Take 250 mg by mouth in the morning and at bedtime. Take two tablets by mouth in the morning & three tablets by mouth in the evening.    . Vitamin D, Ergocalciferol, (DRISDOL) 1.25 MG (50000 UNIT) CAPS capsule TAKE 1 CAPSULE BY MOUTH 1 TIME A WEEK 12 capsule 0   No current  facility-administered medications on file prior to visit.    Social History   Tobacco Use  . Smoking status: Never Smoker  . Smokeless tobacco: Never Used  Vaping Use  . Vaping Use: Never used  Substance Use Topics  . Alcohol use: No    Alcohol/week: 0.0 standard drinks  . Drug use: Never    Review of Systems  Constitutional: Negative for chills and fever.  Respiratory: Negative for cough and shortness of breath.   Cardiovascular: Negative for chest pain and palpitations.  Gastrointestinal: Positive for abdominal pain. Negative for abdominal distention, constipation, diarrhea, nausea and vomiting.  Genitourinary: Negative for difficulty urinating.  Musculoskeletal: Negative for back pain.  Neurological: Negative for numbness.      Objective:    BP (!) 146/90 (BP Location: Left Arm, Patient Position: Sitting, Cuff Size: Large)   Pulse 71  Temp 98.4 F (36.9 C) (Oral)   Ht 5\' 8"  (1.727 m)   Wt 183 lb 6.4 oz (83.2 kg)   LMP 07/11/2016 (Exact Date)   SpO2 96%   BMI 27.89 kg/m    Physical Exam Vitals reviewed.  Constitutional:      Appearance: Normal appearance. She is well-developed and well-nourished.  Eyes:     Conjunctiva/sclera: Conjunctivae normal.  Cardiovascular:     Rate and Rhythm: Normal rate and regular rhythm.     Pulses: Normal pulses.     Heart sounds: Normal heart sounds.  Pulmonary:     Effort: Pulmonary effort is normal.     Breath sounds: Normal breath sounds. No wheezing, rhonchi or rales.  Abdominal:     General: Bowel sounds are normal. There is no distension or ascites.     Palpations: Abdomen is soft. Abdomen is not rigid. There is no fluid wave or mass.     Tenderness: There is no abdominal tenderness. There is no CVA tenderness, guarding or rebound.  Musculoskeletal:       Arms:     Comments: Area of low back pain as marked on diagram.   Skin:    General: Skin is warm and dry.  Neurological:     Mental Status: She is alert.   Psychiatric:        Mood and Affect: Mood and affect normal.        Speech: Speech normal.        Behavior: Behavior normal.        Thought Content: Thought content normal.        Assessment & Plan:      I have discontinued Kriss D. Zeoli's propranolol. I am also having her maintain her ursodiol, phentermine, sertraline, PreviDent 5000 Booster Plus, Vitamin D (Ergocalciferol), and Multiple Vitamins-Minerals (BARIATRIC MULTIVITAMINS/IRON PO).   No orders of the defined types were placed in this encounter.   Return precautions given.   Risks, benefits, and alternatives of the medications and treatment plan prescribed today were discussed, and patient expressed understanding.   Education regarding symptom management and diagnosis given to patient on AVS.  Continue to follow with [name], FNP for routine health maintenance.   [name] and I agreed with plan.   [name], FNP

## 2020-12-09 NOTE — Progress Notes (Signed)
Patient presenting with abdominal pain, ongoing for around 3 years. Pain has increased along the line of the mid abdomen. Pain rated 8/10.  Patient has history of kidney stones and ongoing low back pian.  Patient denies any urinary changes or diarrhea.

## 2020-12-09 NOTE — Patient Instructions (Signed)
We will call Dr Darnell Level in regards to pepcid ac or protonix.   Please let us know how you are doing

## 2020-12-09 NOTE — Assessment & Plan Note (Signed)
Afebrile and well appearing today. Presentation complicated by h/o duodenal switch, hiatal hernia repair, primary biliary cirrhosis. I am concerned related to duodenal switch as improved when patient refrained from eating fast food/ greasy foods and soda. We also discussed possible GERD. Patient declines urinary studies as she has does this at work and pain doesn't remind her of prior renal stones. At this time, we have call out to Dr Darnell Level to make aware of symptoms, schedule follow up and if agreeable, I will start empiric pepcid Ac for GERD. Will communicate all with patient.

## 2020-12-11 MED ORDER — PANTOPRAZOLE SODIUM 20 MG PO TBEC: 20.0000 mg | DELAYED_RELEASE_TABLET | Freq: Every day | ORAL | 1 refills | Status: DC

## 2020-12-11 NOTE — Telephone Encounter (Signed)
Bariatric surgeon prefer Protonix over Pepcid AC, but either can be prescribed. Patient needs to call and schedule follow up.

## 2020-12-11 NOTE — Telephone Encounter (Signed)
Call pt Let her know that dr Darnell Level prefers protonix; I have sent in 20mg  protonix for trail for next 3-4 weeks to see if notices a difference  If not, please advise her to make a follow up with him and call his office

## 2020-12-11 NOTE — Telephone Encounter (Signed)
Patient voiced understanding to medication and calling for follow -up. While on phone patient reported fall to day on floor hitting Coccyx Patient stated she felt like she needed X-rays and pain rated at 7 advised needs to be evaluated, PCP out of office advised patient that [facility] has walkin clinic from 2-7 weeks nights that she could go there now  patient was appreciative of advice and will go to [facility] now for evaluation and treatment.

## 2020-12-18 NOTE — Telephone Encounter (Signed)
Noted  Agree with advice to be seen at [facility] walk in

## 2020-12-24 ENCOUNTER — Other Ambulatory Visit: Payer: Self-pay

## 2020-12-25 ENCOUNTER — Other Ambulatory Visit: Payer: Self-pay | Admitting: Family

## 2020-12-25 DIAGNOSIS — R1013 Epigastric pain: Secondary | ICD-10-CM

## 2020-12-25 NOTE — Progress Notes (Signed)
Name: [name]  MRN/ DOB: [id number], July 19, 1966    Age/ Sex: 55 y.o., female     PCP: [name], FNP   Reason for Endocrinology Evaluation: Thyroid nodule /osteoporosis     Initial Endocrinology Clinic Visit: 06/26/2019    PATIENT IDENTIFIER: [name] is a 55 y.o., female with a past medical history of HTN,T2DM and Hx of gastric bypass and primary biliary cholangitis . She has followed with [facility] since 06/26/2019 for consultative assistance with management of her thyroid nodules .   HISTORICAL SUMMARY:  Was diagnosed with MNG in 2006. Per pt she had drainage of a thyroid cyst with benign cytology. In 2020 she presented to her PCP with c/o worsening neck enlargement, associated with dysphagia,and discomfort. An ultrasound showed a left thyroid nodule that met criteria for FNA. The nodule was smaller then it has been years ago and the pt opted not to proceed with FNA .     Osteoporosis History: She is S/P Gastric Bypass sx in  07/2016 , Pre-surgery weight was 333 Lbs, lowest weight since sx 150 lbs. She was on 2 bariatric vitamins up to a year ago and was stopped due to recurrent renal stones, stopping the vitamins did not help and had ~ 17 renal stones in the past year. Pt had a DXA 04/14/2019 with a T-Score of -3.0 which prompted her to start taking her vitamins .     SUBJECTIVE:   During last visit (06/26/19): She was advised to continue with Vitamin D, calcium and MVI       Today (12/26/2020):  Ms. Provencher is here for a follow up on Osteoporosis  and secondary hyperparathyroidism  As well as MNG  She continues to follow up at [facility] for primary biliary cholangitis. On ursodiol    She has noted left neck swelling ( below jaw) , fluctuating in size, on the left.  Weight stable  Denies any local neck enlargement, she denies any dysphagia Denies perioral  tingling and numbness Denies hand spasms  Slipped and fell on  2/1st and hurt her back, and has been struggling , saw [facility] and was through to h will see Dr. Annette Stable  Has epigastric pain , was prescribed an antacid and she would like to stay away from pills.    Has renal stones Calcium oxalate   Bariatric advantage 2 tabs daily  ( each tab contains 85 mg of calcium and 1500 iu of vitamin D3)  4 of the Raw calcium ( 1600 iu vitamin D and 1100 mg of calcium)  Ergocalciferol 50,000 weekly     HISTORY:  Past Medical History:  Past Medical History:  Diagnosis Date  . Asthma due to seasonal allergies    no inhaler  . History of diabetes mellitus, type II    per pt resolved after bariatric surgery 2017  . History of hypertension    per pt resolved after bariatric surgery 2017  . History of kidney stones   . History of obstructive sleep apnea    per pt hx osa w/ cpap,  retested after bariatric surgery 2017 result negative  . History of skin cancer    nose, not melanoma  . Hydronephrosis, right   . Hyperlipidemia   . MRSA carrier   . NAFL (nonalcoholic fatty liver)   . Osteoporosis   . Renal calculi    per CT 04-18-2019 bilateral , non-obstructive  . S/P bariatric surgery 08/03/2016   duodenal switch  w/ hh repair  . Ureteral calculus, right   . Wears glasses    Past Surgical History:  Past Surgical History:  Procedure Laterality Date  . BARIATRIC SURGERY  08-03-2016    dr Darnell Level [facility]or[facility]'[id number]$ ,  [place]   LAPAROSCOPY DUODENAL SWITCH AND HIATAL HERNIA REPAIR  . BREAST BIOPSY Right 2017   FIBROCYSTIC CHANGES WITH CALCIFICATIONS  . CESAREAN SECTION  x2  last one 09-10-2000  . COLONOSCOPY WITH PROPOFOL N/A 09/11/2019   Procedure: COLONOSCOPY WITH PROPOFOL;  Surgeon: Jonathon Bellows, MD;  Location: [facility] ENDOSCOPY;  Service: Gastroenterology;  Laterality: N/A;  . CYSTOSCOPY WITH RETROGRADE PYELOGRAM, URETEROSCOPY AND STENT PLACEMENT Right 02-07-2012  $[facility]'[email or web address]$   . CYSTOSCOPY WITH STENT PLACEMENT Right 09/10/2017   Procedure: CYSTOSCOPY WITH STENT PLACEMENT;   Surgeon: Abbie Sons, MD;  Location: [facility] ORS;  Service: Urology;  Laterality: Right;  . CYSTOSCOPY/URETEROSCOPY/HOLMIUM LASER/STENT PLACEMENT Right 04/24/2019   Procedure: CYSTOSCOPY/URETEROSCOPY/STENT PLACEMENT/ RIGHT RETROGRADE;  Surgeon: Festus Aloe, MD;  [facility];  Service: Urology;  Laterality: Right;  . DX LAPAROSCOPY/  EGD/  HIATAL HERNIA REPAIR CLOSURE OF INTERNAL MENSENTRY DEFECT  10-28-2017   '@[facility]'$ ,  [place]  . EXTRACORPOREAL SHOCK WAVE LITHOTRIPSY N/A 01/30/2019   Procedure: EXTRACORPOREAL SHOCK WAVE LITHOTRIPSY (ESWL) LEFT/ POSSIBLE RIGHT;  Surgeon: Festus Aloe, MD;  Location: [facility];  Service: Urology;  Laterality: N/A;  . EXTRACORPOREAL SHOCK WAVE LITHOTRIPSY Right 03/20/2019   Procedure: EXTRACORPOREAL SHOCK WAVE LITHOTRIPSY (ESWL);  Surgeon: Franchot Gallo, MD;  Location: [facility];  Service: Urology;  Laterality: Right;  . LAPAROSCOPIC CHOLECYSTECTOMY  10-15-2016   '@[facility]'$  ,  [place]  . TONSILLECTOMY AND ADENOIDECTOMY  child  . URETEROSCOPY WITH HOLMIUM LASER LITHOTRIPSY Right 09/10/2017   Procedure: URETEROSCOPY WITH HOLMIUM LASER LITHOTRIPSY;  Surgeon: Abbie Sons, MD;  Location: [facility] ORS;  Service: Urology;  Laterality: Right;    Social History:  reports that she has never smoked. She has never used smokeless tobacco. She reports that she does not drink alcohol and does not use drugs. Family History:  Family History  Problem Relation Age of Onset  . Alcohol abuse Mother   . Hyperlipidemia Mother   . Hypertension Mother   . Lymphoma Mother        primary site : brain  . Hyperlipidemia Father   . Hypertension Father   . Parkinson's disease Father   . Drug abuse Sister   . Cancer Maternal Grandmother        breast cancer  . Breast cancer Maternal Grandmother   . Liver cancer Paternal Grandfather   . Kidney cancer Neg Hx   . Prostate cancer Neg Hx      HOME MEDICATIONS: Allergies as of 12/26/2020      Reactions    Latex Shortness Of Breath, Cough   Tolmetin Hives   Gallium Nitrate Hives   AND Gadolinium Derivatives   Insulin Detemir Hives, Swelling   Lump at injection site   Nsaids Hives   Per pt stated has to take ibuprofen with a antihistimine   Gadolinium Derivatives Hives   Pt c/o itching after contrast injection 03/15/19 @ 8:45am. [facility] Per Dr. Kathlene Cote document this as allergic reaction.       Medication List       Accurate as of December 26, 2020  7:52 AM. If you have any questions, ask your nurse or doctor.        BARIATRIC MULTIVITAMINS/IRON PO Take by mouth.   pantoprazole 20 MG tablet Commonly known as: PROTONIX  TAKE 1 TABLET BY MOUTH EVERY DAY   phentermine 37.5 MG tablet Commonly known as: ADIPEX-P Take 0.5 tablets by mouth daily.   PreviDent 5000 Booster Plus 1.1 % Pste Generic drug: Sodium Fluoride Place onto teeth as directed.   sertraline 50 MG tablet Commonly known as: ZOLOFT Take 2 tablets by mouth daily.   ursodiol 500 MG tablet Commonly known as: ACTIGALL Take 250 mg by mouth in the morning and at bedtime. Take two tablets by mouth in the morning & three tablets by mouth in the evening.   Vitamin D (Ergocalciferol) 1.25 MG (50000 UNIT) Caps capsule Commonly known as: DRISDOL TAKE 1 CAPSULE BY MOUTH 1 TIME A WEEK         OBJECTIVE:   PHYSICAL EXAM: VS: BP 138/72   Pulse 61   Ht $R'5\' 8"'MZ$  (1.727 m)   Wt 187 lb (84.8 kg)   LMP 07/11/2016 (Exact Date)   SpO2 98%   BMI 28.43 kg/m    EXAM: General: Pt appears well and is in NAD  Neck: General: Supple without adenopathy. Thyroid: Thyroid size normal. Left  nodule appreciated.   Lungs: Clear with good BS bilat with no rales, rhonchi, or wheezes  Heart: Auscultation: RRR.  Abdomen: Normoactive bowel sounds, soft, nontender, without masses or organomegaly palpable  Extremities:  BL LE: No pretibial edema normal ROM and strength.  Neuro: Cranial nerves: II - XII grossly intact  Motor: Normal strength  throughout  Mental Status: Judgment, insight: Intact Orientation: Oriented to time, place, and person Mood and affect: No depression, anxiety, or agitation     DATA REVIEWED: 05/01/2020  Ca 8.8  Alb 3.9  BUN/Cr 13/0.73 GFR 94 Alk phos 225  AST 35 ALT 33  Vitamin D 18.2  PTH 62 pg/mL     Thyroid Ultrasound 07/25/2020 Nodule # 1:  Prior biopsy: No  Location: Right; Inferior  Maximum size: 1.1 cm; Other 2 dimensions: 0.7 x 0.9 cm, previously, 1.1 x 0.6 x 1.1 cm  Composition: solid/almost completely solid (2)  Echogenicity: hypoechoic (2)  Shape: not taller-than-wide (0)  Margins: ill-defined (0)  Echogenic foci: none (0)  ACR TI-RADS total points: 4.  ACR TI-RADS risk category:  TR4 (4-6 points).  Significant change in size (>/= 20% in two dimensions and minimal increase of 2 mm): No  Change in features: No  Change in ACR TI-RADS risk category: No  ACR TI-RADS recommendations:  *Given size (>/= 1 - 1.4 cm) and appearance, a follow-up ultrasound in 1 year should be considered based on TI-RADS criteria.  _________________________________________________________  Nodule # 4:  Prior biopsy: No  Location: Left; Inferior  Maximum size: 2.7 cm; Other 2 dimensions: 1.9 x 2 cm, previously, 2.5 x 1.7 x 2.2 cm  Composition: solid/almost completely solid (2)  Echogenicity: isoechoic (1)  Shape: not taller-than-wide (0)  Margins: ill-defined (0)  Echogenic foci: macrocalcifications (1)  ACR TI-RADS total points: 4.  ACR TI-RADS risk category:  TR4 (4-6 points).  Significant change in size (>/= 20% in two dimensions and minimal increase of 2 mm): No  Change in features: No  Change in ACR TI-RADS risk category: No  ACR TI-RADS recommendations:  **Given size (>/= 1.5 cm) and appearance, fine needle aspiration of this moderately suspicious nodule should be considered based on TI-RADS  criteria.  _________________________________________________________  IMPRESSION: 1. No significant interval change in the size or appearance of the approximately 2.7 cm TI-RADS category 4 nodule in the left inferior gland dating back to July  of 2020. This lesion continues to meet criteria to consider fine-needle aspiration biopsy. 2. No significant interval change in the size or appearance of the 1.1 cm TI-RADS category 4 nodule in the right inferior gland. This nodule continues to meet criteria for annual imaging surveillance. Recommend follow-up ultrasound in 1 year.    FNA 09/18/2020 Clinical History: Left; Inferior 2.7 cm; Other 2 dimensions: 1.9 x 2  cm; TI-RADS - 4  Specimen Submitted: A. THYROID GLAND, LEFT, LLP, FINE NEEDLE  ASPIRATION:    FINAL MICROSCOPIC DIAGNOSIS:  - Consistent with benign follicular nodule (Bethesda category II)     DXA 11/06/2020  AP Spine L2-L4 11/06/2020 54.1 Osteoporosis -3.7 0.757 g/cm2 - -  DualFemur Total Right 11/06/2020 54.1 Osteopenia -1.3 0.849 g/cm2 -2.5% Yes DualFemur Total Right 04/14/2019 52.6 Osteopenia -1.1 0.871 g/cm2 - -  DualFemur Total Mean 11/06/2020 54.1 Osteopenia -1.3 0.849 g/cm2 -3.4% Yes DualFemur Total Mean 04/14/2019 52.6 Normal -1.0 0.879 g/cm2 - -  ASSESSMENT / PLAN / RECOMMENDATIONS:   1. Multinodular Goiter:   - Pt is clinically and biochemically euthyroid  - No local neck symptoms  - FNA of the left inferior nodule was benign 07/25/2020 - Will be due for repeat ultrasound 07/2021  2. Osteoporosis  :   - She fell a couple of weeks ago, going to see Dr. Trenton Gammon, there's a questionable spinal fracrures - She would like to avoid oral medications, we discussed options such as Zoledronic acid, Prolia and Forteo   - After discussing the risk and benefits, she opted for Prolia    Medications  Bariatric advantage 2 tabs daily  ( each tab contains 85 mg of calcium and 1500 iu of vitamin D3)  4 of  the Raw calcium ( 1600 iu vitamin D and 1100 mg of calcium) Prolia 60 mg [place] Q 6 months    3. Vitamin D deficiency:  Ergocalciferol 50,000 iu weekly to her above regimen.   4. Left Submandibular mass:    - Looks to me like a salivary gland, its non tender, 1.5 cm and firm. I am going to proceed with a CT for further evaluation as this could be a lymph node, and an ENT referral   5. Renal stones:  - Pt tells me she has renal stones and is under the care of urology. Stones are calcium oxalate.  - I explained to  her that these stones are caused by oxalate rather then calcium, she believes she is on a low oxalate diet, will defer further management to urology  Pt was supposed to stop for labs today but left without labs.    F/U in 6 months     Signed electronically by: Mack Guise, MD  [facility] Endocrinology  [facility] [address] [place], [place] [address] Phone: [phone] FAX: [phone]      CC: [name], FNP [address] [place] [address] Phone: [phone]  Fax: [phone]   Return to Endocrinology clinic as below: No future appointments.

## 2020-12-26 ENCOUNTER — Encounter: Payer: Self-pay | Admitting: Internal Medicine

## 2020-12-26 ENCOUNTER — Other Ambulatory Visit: Payer: Self-pay

## 2020-12-26 ENCOUNTER — Ambulatory Visit: Payer: 59 | Admitting: Internal Medicine

## 2020-12-26 VITALS — BP 138/72 | HR 61 | Ht 68.0 in | Wt 187.0 lb

## 2020-12-26 DIAGNOSIS — N2581 Secondary hyperparathyroidism of renal origin: Secondary | ICD-10-CM | POA: Diagnosis not present

## 2020-12-26 DIAGNOSIS — Z9884 Bariatric surgery status: Secondary | ICD-10-CM

## 2020-12-26 DIAGNOSIS — E042 Nontoxic multinodular goiter: Secondary | ICD-10-CM | POA: Diagnosis not present

## 2020-12-26 DIAGNOSIS — R220 Localized swelling, mass and lump, head: Secondary | ICD-10-CM

## 2020-12-26 DIAGNOSIS — M810 Age-related osteoporosis without current pathological fracture: Secondary | ICD-10-CM | POA: Diagnosis not present

## 2020-12-26 NOTE — Patient Instructions (Addendum)
-   Ergocalciferol 50,000 iu weekly  - Bariatric advantage 2 tabs daily -  Raw calcium 4 tablets daily

## 2021-01-14 ENCOUNTER — Encounter (INDEPENDENT_AMBULATORY_CARE_PROVIDER_SITE_OTHER): Payer: Self-pay | Admitting: Otolaryngology

## 2021-01-14 ENCOUNTER — Other Ambulatory Visit: Payer: Self-pay

## 2021-01-14 ENCOUNTER — Ambulatory Visit (INDEPENDENT_AMBULATORY_CARE_PROVIDER_SITE_OTHER): Payer: 59 | Admitting: Otolaryngology

## 2021-01-14 VITALS — Temp 97.5°F

## 2021-01-14 DIAGNOSIS — R609 Edema, unspecified: Secondary | ICD-10-CM

## 2021-01-14 NOTE — Progress Notes (Signed)
HPI: [name] is a 55 y.o. female who presents is referred by  Dr Kelton Pillar for evaluation of left submandibular mass.  Apparently she developed swelling in the left submandibular area that persisted for several weeks.  It was not tender.  However over the past week or 2 it has subsided and gone away.  Patient was seen by her endocrinologist who follows her multinodular goiter and was referred here for further evaluation of left submandibular mass.  Past Medical History:  Diagnosis Date  . Asthma due to seasonal allergies    no inhaler  . History of diabetes mellitus, type II    per pt resolved after bariatric surgery 2017  . History of hypertension    per pt resolved after bariatric surgery 2017  . History of kidney stones   . History of obstructive sleep apnea    per pt hx osa w/ cpap,  retested after bariatric surgery 2017 result negative  . History of skin cancer    nose, not melanoma  . Hydronephrosis, right   . Hyperlipidemia   . MRSA carrier   . NAFL (nonalcoholic fatty liver)   . Osteoporosis   . Renal calculi    per CT 04-18-2019 bilateral , non-obstructive  . S/P bariatric surgery 08/03/2016   duodenal switch w/ hh repair  . Ureteral calculus, right   . Wears glasses    Past Surgical History:  Procedure Laterality Date  . BARIATRIC SURGERY  08-03-2016    dr Darnell Level @[facility] ,  [place]   LAPAROSCOPY DUODENAL SWITCH AND HIATAL HERNIA REPAIR  . BREAST BIOPSY Right 2017   FIBROCYSTIC CHANGES WITH CALCIFICATIONS  . CESAREAN SECTION  x2  last one 09-10-2000  . COLONOSCOPY WITH PROPOFOL N/A 09/11/2019   Procedure: COLONOSCOPY WITH PROPOFOL;  Surgeon: Jonathon Bellows, MD;  Location: [facility] ENDOSCOPY;  Service: Gastroenterology;  Laterality: N/A;  . CYSTOSCOPY WITH RETROGRADE PYELOGRAM, URETEROSCOPY AND STENT PLACEMENT Right 02-07-2012  @[facility]   . CYSTOSCOPY WITH STENT PLACEMENT Right 09/10/2017   Procedure: CYSTOSCOPY WITH STENT PLACEMENT;  Surgeon: Abbie Sons, MD;   Location: [facility];  Service: Urology;  Laterality: Right;  . CYSTOSCOPY/URETEROSCOPY/HOLMIUM LASER/STENT PLACEMENT Right 04/24/2019   Procedure: CYSTOSCOPY/URETEROSCOPY/STENT PLACEMENT/ RIGHT RETROGRADE;  Surgeon: Festus Aloe, MD;  Location: [facility];  Service: Urology;  Laterality: Right;  . DX LAPAROSCOPY/  EGD/  HIATAL HERNIA REPAIR CLOSURE OF INTERNAL MENSENTRY DEFECT  10-28-2017   @[facility] ,  [place]  . EXTRACORPOREAL SHOCK WAVE LITHOTRIPSY N/A 01/30/2019   Procedure: EXTRACORPOREAL SHOCK WAVE LITHOTRIPSY (ESWL) LEFT/ POSSIBLE RIGHT;  Surgeon: Festus Aloe, MD;  Location: [facility];  Service: Urology;  Laterality: N/A;  . EXTRACORPOREAL SHOCK WAVE LITHOTRIPSY Right 03/20/2019   Procedure: EXTRACORPOREAL SHOCK WAVE LITHOTRIPSY (ESWL);  Surgeon: Franchot Gallo, MD;  Location: [facility];  Service: Urology;  Laterality: Right;  . LAPAROSCOPIC CHOLECYSTECTOMY  10-15-2016   @[facility]  ,  [place]  . TONSILLECTOMY AND ADENOIDECTOMY  child  . URETEROSCOPY WITH HOLMIUM LASER LITHOTRIPSY Right 09/10/2017   Procedure: URETEROSCOPY WITH HOLMIUM LASER LITHOTRIPSY;  Surgeon: Abbie Sons, MD;  Location: [facility];  Service: Urology;  Laterality: Right;   Social History   Socioeconomic History  . Marital status: Single    Spouse name: Not on file  . Number of children: 2  . Years of education: 2  . Highest education level: Not on file  Occupational History  . Occupation: Mudlogger: [employer]  . Occupation: Teaches for [employer]  Comment: Through [place]  . Occupation: Engineer, materials  . Occupation: [employer]    Comment: Microbiology - Weekend  Tobacco Use  . Smoking status: Never Smoker  . Smokeless tobacco: Never Used  Vaping Use  . Vaping Use: Never used  Substance and Sexual Activity  . Alcohol use: No    Alcohol/week: 0.0 standard drinks  . Drug use: Never  . Sexual activity: Yes    Birth control/protection: None, Post-menopausal   Other Topics Concern  . Not on file  Social History Narrative   Pricilla Stout grew up partly in [place] and then [place]. She lives in [place] with her two daughter. 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 She enjoys the outdoors, gardening.    Right handed   Two story home   Takes caffeine   Social Determinants of Health   Financial Resource Strain: Not on file  Food Insecurity: Not on file  Transportation Needs: Not on file  Physical Activity: Not on file  Stress: Not on file  Social Connections: Not on file   Family History  Problem Relation Age of Onset  . Alcohol abuse Mother   . Hyperlipidemia Mother   . Hypertension Mother   . Lymphoma Mother        primary site : brain  . Hyperlipidemia Father   . Hypertension Father   . Parkinson's disease Father   . Drug abuse Sister   . Cancer Maternal Grandmother        breast cancer  . Breast cancer Maternal Grandmother   . Liver cancer Paternal Grandfather   . Kidney cancer Neg Hx   . Prostate cancer Neg Hx    Allergies  Allergen Reactions  . Latex Shortness Of Breath and Cough  . Tolmetin Hives  . Gallium Nitrate Hives    AND Gadolinium Derivatives   . Insulin Detemir Hives and Swelling    Lump at injection site  . Nsaids Hives    Per pt stated has to take ibuprofen with a antihistimine  . Gadolinium Derivatives Hives    Pt c/o itching after contrast injection 03/15/19 @ 8:45am. [facility] Per Dr. Kathlene Cote document this as allergic reaction.    Prior to Admission medications   Medication Sig Start Date End Date Taking? Authorizing Provider  Multiple Vitamins-Minerals (BARIATRIC MULTIVITAMINS/IRON PO) Take by mouth.    [provider]  pantoprazole (PROTONIX) 20 MG tablet TAKE 1 TABLET BY MOUTH EVERY DAY 12/25/20   [name], FNP  phentermine (ADIPEX-P) 37.5 MG tablet Take 0.5 tablets by mouth daily. 06/05/20 12/26/20  [provider]  PREVIDENT 5000 BOOSTER PLUS 1.1 % PSTE  Place onto teeth as directed. 08/23/20   [provider]  sertraline (ZOLOFT) 50 MG tablet Take 2 tablets by mouth daily. 05/28/20 05/28/21  [provider]  ursodiol (ACTIGALL) 500 MG tablet Take 250 mg by mouth in the morning and at bedtime. Take two tablets by mouth in the morning & three tablets by mouth in the evening.    [provider]  Vitamin D, Ergocalciferol, (DRISDOL) 1.25 MG (50000 UNIT) CAPS capsule TAKE 1 CAPSULE BY MOUTH 1 TIME A WEEK 12/03/20   Shamleffer, Melanie Crazier, MD     Positive ROS: Otherwise negative  All other systems have been reviewed and were otherwise negative with the exception of those mentioned in the HPI and as above.  Physical Exam: Constitutional: Alert, well-appearing, no acute distress Ears: External ears without lesions or tenderness. Ear canals  are clear bilaterally with intact, clear TMs.  Nasal: External nose without lesions.. Clear nasal passages Oral: Lips and gums without lesions. Tongue and palate mucosa without lesions. Posterior oropharynx clear.  The submandibular ducts have clear drainage on both sides with no palpable stones within either duct. Neck: On neck exam today there is no significant palpable enlargement of the submandibular gland on either side.  The area where she points that she had swelling is in the region of the left submandibular gland and on bimanual palpation of this area it is normal to palpation with no palpable nodules or masses.  There is no significant palpable adenopathy in the neck on the left side. Respiratory: Breathing comfortably  Skin: No facial/neck lesions or rash noted.  Procedures  Assessment: Probable left submandibular gland enlargement from partial obstruction.  This has now resolved and no further therapy is warranted unless it returns.  Plan: Reviewed with the patient concerning the submandibular gland on the left side and that if she has recurrent swelling she will  follow-up here as needed for further evaluation.  No further intervention is warranted at this time.   Radene Journey, MD   CC:

## 2021-01-16 ENCOUNTER — Telehealth: Payer: Self-pay

## 2021-01-16 NOTE — Telephone Encounter (Signed)
Prolia VOB initiated via [employer] portal.  

## 2021-01-17 ENCOUNTER — Other Ambulatory Visit: Payer: 59

## 2021-01-21 ENCOUNTER — Other Ambulatory Visit: Payer: Self-pay

## 2021-01-21 DIAGNOSIS — N2581 Secondary hyperparathyroidism of renal origin: Secondary | ICD-10-CM

## 2021-01-21 DIAGNOSIS — E042 Nontoxic multinodular goiter: Secondary | ICD-10-CM

## 2021-01-21 DIAGNOSIS — M810 Age-related osteoporosis without current pathological fracture: Secondary | ICD-10-CM

## 2021-01-21 NOTE — Telephone Encounter (Signed)
PA REQUIRED  PRIMARY MEDICAL BENEFIT DETAILS (PHYSICIAN PURCHASE, OR REFERRAL TO TREATING SITE) COVERAGE AVAILABLE: Yes  COVERAGE DETAILS: For the primary MD Purchase option, the patient will be responsible for a $20 copay which will cover Prolia and administration. No deductible or coinsurance applies. Co-pay does apply up to a $2500 out of pocket max ($367.46 met).  AUTHORIZATION REQUIRED: Yes PA PROCESS DETAILS: Please complete the prior authorization form located at [email or web address] or call [phone]

## 2021-01-22 LAB — BASIC METABOLIC PANEL
BUN/Creatinine Ratio: 25 — ABNORMAL HIGH (ref 9–23)
BUN: 19 mg/dL (ref 6–24)
CO2: 21 mmol/L (ref 20–29)
Calcium: 8.8 mg/dL (ref 8.7–10.2)
Chloride: 110 mmol/L — ABNORMAL HIGH (ref 96–106)
Creatinine, Ser: 0.75 mg/dL (ref 0.57–1.00)
Glucose: 84 mg/dL (ref 65–99)
Potassium: 4.7 mmol/L (ref 3.5–5.2)
Sodium: 146 mmol/L — ABNORMAL HIGH (ref 134–144)
eGFR: 95 mL/min/{1.73_m2} (ref >59)

## 2021-01-22 LAB — PARATHYROID HORMONE, INTACT (NO CA): PTH: 80 pg/mL — ABNORMAL HIGH (ref 15–65)

## 2021-01-22 LAB — ALBUMIN: Albumin: 3.9 g/dL (ref 3.8–4.9)

## 2021-01-22 LAB — VITAMIN D 25 HYDROXY (VIT D DEFICIENCY, FRACTURES): Vit D, 25-Hydroxy: 32.8 ng/mL (ref 30.0–100.0)

## 2021-01-22 LAB — TSH: TSH: 1.16 u[IU]/mL (ref 0.450–4.500)

## 2021-01-22 LAB — T4, FREE: Free T4: 0.97 ng/dL (ref 0.82–1.77)

## 2021-02-03 ENCOUNTER — Ambulatory Visit (INDEPENDENT_AMBULATORY_CARE_PROVIDER_SITE_OTHER): Payer: 59

## 2021-02-03 ENCOUNTER — Ambulatory Visit
Admission: RE | Admit: 2021-02-03 | Discharge: 2021-02-03 | Disposition: A | Discharge status: Home / Self Care | Payer: 59 | Source: Ambulatory Visit | Attending: Family Medicine | Admitting: Family Medicine

## 2021-02-03 ENCOUNTER — Other Ambulatory Visit: Payer: Self-pay

## 2021-02-03 VITALS — BP 164/100 | HR 76 | Temp 97.6°F | Wt 180.0 lb

## 2021-02-03 DIAGNOSIS — S99922A Unspecified injury of left foot, initial encounter: Secondary | ICD-10-CM

## 2021-02-03 DIAGNOSIS — M79672 Pain in left foot: Secondary | ICD-10-CM | POA: Diagnosis not present

## 2021-02-03 DIAGNOSIS — W010XXA Fall on same level from slipping, tripping and stumbling without subsequent striking against object, initial encounter: Secondary | ICD-10-CM

## 2021-02-03 MED ORDER — PREDNISONE 10 MG PO TABS: 30.0000 mg | ORAL_TABLET | Freq: Every day | ORAL | 0 refills | Status: AC

## 2021-02-03 NOTE — Discharge Instructions (Signed)
Take the prednisone as prescribed See ortho for follow up

## 2021-02-03 NOTE — ED Triage Notes (Signed)
Patient presents to Urgent Care with complaints of left toe injury that happened 3 weeks ago from a fall. Pt states pain increases with movement, has swelling and exp some numbness and tingling sensation as well. Treating toe injury by applying ice, elevating, and ibuprofen for pain. She states her jobs require lots of standing and so it has been difficult to rest her left foot.

## 2021-02-04 NOTE — ED Provider Notes (Signed)
[name]    CSN: [id number] Arrival date & time: 02/03/21  1428      History   Chief Complaint Chief Complaint  Patient presents with  . Appointment    1400    HPI [name] is a 55 y.o. female.   Patient is a 55 year old female who presents today with complaints of left second toe pain, swelling and injury.  This happened approximate 3 weeks ago.  Reporting she woke up out of bed with cramping in the foot and tripped and fell bending toes backwards.  She has since been applying ice, elevating and taking ibuprofen with some relief.  The swelling seems to come and go.  Pain worsens with wearing shoes.  Has had some associated numbness and tingling and decreased sensation as well.      Past Medical History:  Diagnosis Date  . Asthma due to seasonal allergies    no inhaler  . History of diabetes mellitus, type II    per pt resolved after bariatric surgery 2017  . History of hypertension    per pt resolved after bariatric surgery 2017  . History of kidney stones   . History of obstructive sleep apnea    per pt hx osa w/ cpap,  retested after bariatric surgery 2017 result negative  . History of skin cancer    nose, not melanoma  . Hydronephrosis, right   . Hyperlipidemia   . MRSA carrier   . NAFL (nonalcoholic fatty liver)   . Osteoporosis   . Renal calculi    per CT 04-18-2019 bilateral , non-obstructive  . S/P bariatric surgery 08/03/2016   duodenal switch w/ hh repair  . Ureteral calculus, right   . Wears glasses     Patient Active Problem List   Diagnosis Date Noted  . Low back pain 12/09/2020  . 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 Headache 07/05/2020  . Osteomalacia 12/27/2019  . History of weight loss 11/28/2019  . 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 Localized osteoporosis without current pathological fracture 06/26/2019  . Hypocalcemia 06/26/2019  . Secondary hyperparathyroidism ([place]) 06/26/2019  . Multinodular  goiter 06/26/2019  . Dysphagia 05/05/2019  . Osteoporosis 05/03/2019  . Memory loss 02/13/2019  . Elevated liver function tests 10/13/2017  . Epigastric abdominal pain 10/13/2017  . History of kidney stones 10/13/2017  . Hypertension 10/13/2017  . Status post bariatric surgery 09/03/2017  . Right ureteral calculus 08/31/2017  . Hydronephrosis with urinary obstruction due to ureteral calculus 08/31/2017  . Nephrolithiasis 08/31/2017  . Screen for colon cancer 07/26/2017  . Sleep apnea 10/06/2016  . Candidal intertrigo 09/21/2016  . H/O gastric bypass 09/21/2016  . Hiatal hernia 04/21/2016  . Elevated liver enzymes 09/04/2014  . Vitamin D deficiency 09/03/2014  . Routine general medical examination at a health care facility 01/11/2014    Past Surgical History:  Procedure Laterality Date  . BARIATRIC SURGERY  08-03-2016    dr Darnell Level @[facility] ,  [place]   LAPAROSCOPY DUODENAL SWITCH AND HIATAL HERNIA REPAIR  . BREAST BIOPSY Right 2017   FIBROCYSTIC CHANGES WITH CALCIFICATIONS  . CESAREAN SECTION  x2  last one 09-10-2000  . COLONOSCOPY WITH PROPOFOL N/A 09/11/2019   Procedure: COLONOSCOPY WITH PROPOFOL;  Surgeon: Jonathon Bellows, MD;  Location: [facility] ENDOSCOPY;  Service: Gastroenterology;  Laterality: N/A;  . CYSTOSCOPY WITH RETROGRADE PYELOGRAM, URETEROSCOPY AND STENT PLACEMENT Right 02-07-2012  @[facility]   . CYSTOSCOPY WITH STENT PLACEMENT Right 09/10/2017   Procedure: CYSTOSCOPY WITH STENT PLACEMENT;  Surgeon:  Stoioff, Ronda Fairly, MD;  Location: [facility] OR[facility];  Service: Urology;  Laterality: Right;  . CYSTOSCOPY/URETEROSCOPY/HOLMIUM LASER/STENT PLACEMENT Right 04/24/2019   Procedure: CYSTOSCOPY/URETEROSCOPY/STENT PLACEMENT/ RIGHT RETROGRADE;  Surgeon: Festus Aloe, MD;  Location: [facility];  Service: Urology;  Laterality: Right;  . DX LAPAROSCOPY/  EGD/  HIATAL HERNIA REPAIR CLOSURE OF INTERNAL MENSENTRY DEFECT  10-28-2017   @[facility] ,  [place]  . EXTRACORPOREAL SHOCK WAVE LITHOTRIPSY N/A  01/30/2019   Procedure: EXTRACORPOREAL SHOCK WAVE LITHOTRIPSY (ESWL) LEFT/ POSSIBLE RIGHT;  Surgeon: Festus Aloe, MD;  Location: [facility];  Service: Urology;  Laterality: N/A;  . EXTRACORPOREAL SHOCK WAVE LITHOTRIPSY Right 03/20/2019   Procedure: EXTRACORPOREAL SHOCK WAVE LITHOTRIPSY (ESWL);  Surgeon: Franchot Gallo, MD;  Location: [facility];  Service: Urology;  Laterality: Right;  . LAPAROSCOPIC CHOLECYSTECTOMY  10-15-2016   @[facility]  ,  [place]  . TONSILLECTOMY AND ADENOIDECTOMY  child  . URETEROSCOPY WITH HOLMIUM LASER LITHOTRIPSY Right 09/10/2017   Procedure: URETEROSCOPY WITH HOLMIUM LASER LITHOTRIPSY;  Surgeon: Abbie Sons, MD;  Location: [facility];  Service: Urology;  Laterality: Right;    OB History    Gravida  3   Para  2   Term      Preterm      AB  1   Living        SAB  1   IAB      Ectopic      Multiple      Live Births           Obstetric Comments  1st Menstrual Cycle:  ? 1st Pregnancy:  21         Home Medications    Prior to Admission medications   Medication Sig Start Date End Date Taking? Authorizing Provider  predniSONE (DELTASONE) 10 MG tablet Take 3 tablets (30 mg total) by mouth daily for 5 days. 02/03/21 02/08/21 Yes Katyra Tomassetti A, NP  Multiple Vitamins-Minerals (BARIATRIC MULTIVITAMINS/IRON PO) Take by mouth.    [provider]  pantoprazole (PROTONIX) 20 MG tablet TAKE 1 TABLET BY MOUTH EVERY DAY 12/25/20   [name], FNP  phentermine (ADIPEX-P) 37.5 MG tablet Take 0.5 tablets by mouth daily. 06/05/20 12/26/20  [provider]  PREVIDENT 5000 BOOSTER PLUS 1.1 % PSTE Place onto teeth as directed. 08/23/20   [provider]  sertraline (ZOLOFT) 50 MG tablet Take 2 tablets by mouth daily. 05/28/20 05/28/21  [provider]  ursodiol (ACTIGALL) 500 MG tablet Take 250 mg by mouth in the morning and at bedtime. Take two tablets by mouth in the morning & three tablets by mouth in the evening.    [provider]  Vitamin D, Ergocalciferol, (DRISDOL) 1.25 MG (50000 UNIT) CAPS capsule TAKE 1 CAPSULE BY MOUTH 1 TIME A WEEK 12/03/20   Shamleffer, Melanie Crazier, MD    Family History Family History  Problem Relation Age of Onset  . Alcohol abuse Mother   . Hyperlipidemia Mother   . Hypertension Mother   . Lymphoma Mother        primary site : brain  . Hyperlipidemia Father   . Hypertension Father   . Parkinson's disease Father   . Drug abuse Sister   . Cancer Maternal Grandmother        breast cancer  . Breast cancer Maternal Grandmother   . Liver cancer Paternal Grandfather   . Kidney cancer Neg Hx   . Prostate cancer Neg Hx     Social History Social History  Tobacco Use  . Smoking status: Never Smoker  . Smokeless tobacco: Never Used  Vaping Use  . Vaping Use: Never used  Substance Use Topics  . Alcohol use: No    Alcohol/week: 0.0 standard drinks  . Drug use: Never     Allergies   Latex, Tolmetin, Gallium nitrate, Insulin detemir, Nsaids, and Gadolinium derivatives   Review of Systems Review of Systems   Physical Exam Triage Vital Signs ED Triage Vitals  Enc Vitals Group     BP 02/03/21 1508 (S) (!) 164/100     Pulse Rate 02/03/21 1508 76     Resp --      Temp 02/03/21 1508 97.6 F (36.4 C)     Temp Source 02/03/21 1508 Temporal     SpO2 02/03/21 1508 (!) 86 %     Weight 02/03/21 1505 180 lb (81.6 kg)     Height --      Head Circumference --      Peak Flow --      Pain Score 02/03/21 1504 4     Pain Loc --      Pain Edu? --      Excl. in [place]? --    No data found.  Updated Vital Signs BP (S) (!) 164/100 (BP Location: Left Arm)   Pulse 76   Temp 97.6 F (36.4 C) (Temporal)   Wt 180 lb (81.6 kg)   LMP 07/11/2016 (Exact Date)   SpO2 (!) 86%   BMI 27.37 kg/m   Visual Acuity Right Eye Distance:   Left Eye Distance:   Bilateral Distance:    Right Eye Near:   Left Eye Near:    Bilateral Near:     Physical Exam Vitals and nursing  note reviewed.  Constitutional:      General: She is not in acute distress.    Appearance: Normal appearance. She is not ill-appearing, toxic-appearing or diaphoretic.  HENT:     Head: Normocephalic.  Eyes:     Conjunctiva/sclera: Conjunctivae normal.  Pulmonary:     Effort: Pulmonary effort is normal.  Musculoskeletal:        General: Normal range of motion.     Cervical back: Normal range of motion.       Feet:  Feet:     Comments: Left second toe swelling, erythema and mild deformity with deviation laterally.  Limited range of motion.  Most pain at proximal joint Skin:    General: Skin is warm and dry.     Findings: No rash.  Neurological:     Mental Status: She is alert.  Psychiatric:        Mood and Affect: Mood normal.      UC Treatments / Results  Labs (all labs ordered are listed, but only abnormal results are displayed) Labs Reviewed - No data to display  EKG   Radiology DG Foot Complete Left  Result Date: 02/03/2021 CLINICAL DATA:  55 year old female left foot pain 2 weeks after falling. EXAM: LEFT FOOT - COMPLETE 3+ VIEW COMPARISON:  None. FINDINGS: There is no evidence of fracture or dislocation. There is no evidence of significant arthropathy or other focal bone abnormality. Soft tissues are unremarkable. IMPRESSION: No acute fracture or malalignment. Electronically Signed   By: Ruthann Cancer MD   On: 02/03/2021 15:58    Procedures Procedures (including critical care time)  Medications Ordered in UC Medications - No data to display  Initial Impression / Assessment and Plan / UC Course  I have reviewed the triage vital signs and the nursing notes.  Pertinent labs & imaging results that were available during my care of the patient were reviewed by me and considered in my medical decision making (see chart for details).     Left second toe injury, swelling X-ray without any acute fractures today. Concern for some sort of ligamentous/tendon injury.   Also some appearance of gout on exam. She really doesn't like taking Ibuprofen due to hx of gastric bypass.  We will prescribe prednisone for pain, inflammation and swelling, in case this is more inflammatory.  Due to mild for me there is some underlying concern for other injury. We will have her follow-up with orthopedics for further evaluation and management. Final Clinical Impressions(s) / UC Diagnoses   Final diagnoses:  Injury of toe on left foot, initial encounter     Discharge Instructions     Take the prednisone as prescribed See ortho for follow up    ED Prescriptions    Medication Sig Dispense Auth. Provider   predniSONE (DELTASONE) 10 MG tablet Take 3 tablets (30 mg total) by mouth daily for 5 days. 15 tablet Anye Brose A, NP     PDMP not reviewed this encounter.   Loura Halt A, NP 02/04/21 1059

## 2021-02-12 NOTE — Telephone Encounter (Signed)
PA initiated via [email or web address]  Request Status  Your Authorization Request Is Pending  Your request number is [id number].   Your request may require review by our clinical team. Also, if additional information is needed to make a determination, we will reach out to you via the contact information provided below. Please see below for details regarding your request.  Request Status Pending  Request Number [id number]

## 2021-02-19 NOTE — Telephone Encounter (Signed)
Pt ready for scheduling  Out-of-pocket cost due at time of visit: $20  [employer] co-insurance: $20 Admin fee co-insurance: $0  Deductible does not apply  PA required - APPROVED #[id number], valid 02/12/21-02/12/22

## 2021-02-19 NOTE — Telephone Encounter (Signed)
  Your Authorization Request Has Been Approved Your authorization request number is [id number]. If you need to add a new specialty drug covered under the medical benefit, you will need to submit a new authorization request.  Authorization Status Approved Authorization Number [id number]  Authorization Start Date 02-12-2021 Authorization End Date 02-12-2022

## 2021-02-26 NOTE — Telephone Encounter (Signed)
ATC to book [facility]lia - mailbox was full

## 2021-03-29 ENCOUNTER — Other Ambulatory Visit: Payer: Self-pay

## 2021-03-29 DIAGNOSIS — Z5321 Procedure and treatment not carried out due to patient leaving prior to being seen by health care provider: Secondary | ICD-10-CM | POA: Insufficient documentation

## 2021-03-29 DIAGNOSIS — G8918 Other acute postprocedural pain: Secondary | ICD-10-CM | POA: Diagnosis not present

## 2021-03-29 LAB — COMPREHENSIVE METABOLIC PANEL
ALT: 20 U/L (ref 0–44)
AST: 22 U/L (ref 15–41)
Albumin: 2.8 g/dL — ABNORMAL LOW (ref 3.5–5.0)
Alkaline Phosphatase: 136 U/L — ABNORMAL HIGH (ref 38–126)
Anion gap: 7 (ref 5–15)
BUN: 14 mg/dL (ref 6–20)
CO2: 23 mmol/L (ref 22–32)
Calcium: 7.5 mg/dL — ABNORMAL LOW (ref 8.9–10.3)
Chloride: 107 mmol/L (ref 98–111)
Creatinine, Ser: 0.73 mg/dL (ref 0.44–1.00)
GFR, Estimated: >60 mL/min (ref >60)
Glucose, Bld: 109 mg/dL — ABNORMAL HIGH (ref 70–99)
Potassium: 3.5 mmol/L (ref 3.5–5.1)
Sodium: 137 mmol/L (ref 135–145)
Total Bilirubin: 0.5 mg/dL (ref 0.3–1.2)
Total Protein: 6.2 g/dL — ABNORMAL LOW (ref 6.5–8.1)

## 2021-03-29 LAB — CBC WITH DIFFERENTIAL/PLATELET
Abs Immature Granulocytes: 0.02 10*3/uL (ref 0.00–0.07)
Basophils Absolute: 0 10*3/uL (ref 0.0–0.1)
Basophils Relative: 0 %
Eosinophils Absolute: 0.3 10*3/uL (ref 0.0–0.5)
Eosinophils Relative: 3 %
HCT: 28.5 % — ABNORMAL LOW (ref 36.0–46.0)
Hemoglobin: 9.1 g/dL — ABNORMAL LOW (ref 12.0–15.0)
Immature Granulocytes: 0 %
Lymphocytes Relative: 15 %
Lymphs Abs: 1.2 10*3/uL (ref 0.7–4.0)
MCH: 28.8 pg (ref 26.0–34.0)
MCHC: 31.9 g/dL (ref 30.0–36.0)
MCV: 90.2 fL (ref 80.0–100.0)
Monocytes Absolute: 0.6 10*3/uL (ref 0.1–1.0)
Monocytes Relative: 7 %
Neutro Abs: 6.3 10*3/uL (ref 1.7–7.7)
Neutrophils Relative %: 75 %
Platelets: 344 10*3/uL (ref 150–400)
RBC: 3.16 MIL/uL — ABNORMAL LOW (ref 3.87–5.11)
RDW: 13.2 % (ref 11.5–15.5)
WBC: 8.5 10*3/uL (ref 4.0–10.5)
nRBC: 0 % (ref 0.0–0.2)

## 2021-03-29 LAB — LACTIC ACID, PLASMA: Lactic Acid, Venous: 0.7 mmol/L (ref 0.5–1.9)

## 2021-03-29 NOTE — ED Triage Notes (Signed)
Plastic surgery by dr. Salley Scarlet ([facility] removed 10lbs skin from bilateral arms, abdomen x2 with drains x2, brazilian butt lift) on 5/13, cefadroxil 500mg  completed tues, low grade fevers at night since surgery, 1500 increased pain and took norco. tmax 102.9 (tylenol 2200). R arm red/hot, reports umbilicus site has foul odor

## 2021-03-29 NOTE — ED Notes (Signed)
Lav, mint, lactic, blood cx x2 sent to lab

## 2021-03-30 ENCOUNTER — Emergency Department
Admission: EM | Admit: 2021-03-30 | Discharge: 2021-03-30 | Disposition: A | Discharge status: Home / Self Care | Payer: 59 | Attending: Emergency Medicine | Admitting: Emergency Medicine

## 2021-04-01 ENCOUNTER — Other Ambulatory Visit: Payer: Self-pay

## 2021-04-01 ENCOUNTER — Emergency Department (HOSPITAL_BASED_OUTPATIENT_CLINIC_OR_DEPARTMENT_OTHER): Payer: 59

## 2021-04-01 ENCOUNTER — Encounter (HOSPITAL_BASED_OUTPATIENT_CLINIC_OR_DEPARTMENT_OTHER): Payer: Self-pay | Admitting: *Deleted

## 2021-04-01 ENCOUNTER — Emergency Department (HOSPITAL_BASED_OUTPATIENT_CLINIC_OR_DEPARTMENT_OTHER)
Admission: EM | Admit: 2021-04-01 | Discharge: 2021-04-01 | Disposition: A | Discharge status: Home / Self Care | Payer: 59 | Attending: Emergency Medicine | Admitting: Emergency Medicine

## 2021-04-01 DIAGNOSIS — E119 Type 2 diabetes mellitus without complications: Secondary | ICD-10-CM | POA: Insufficient documentation

## 2021-04-01 DIAGNOSIS — J45909 Unspecified asthma, uncomplicated: Secondary | ICD-10-CM | POA: Insufficient documentation

## 2021-04-01 DIAGNOSIS — T8149XA Infection following a procedure, other surgical site, initial encounter: Secondary | ICD-10-CM | POA: Diagnosis not present

## 2021-04-01 DIAGNOSIS — Z91040 Latex allergy status: Secondary | ICD-10-CM | POA: Insufficient documentation

## 2021-04-01 DIAGNOSIS — I10 Essential (primary) hypertension: Secondary | ICD-10-CM | POA: Diagnosis not present

## 2021-04-01 DIAGNOSIS — X58XXXA Exposure to other specified factors, initial encounter: Secondary | ICD-10-CM | POA: Insufficient documentation

## 2021-04-01 DIAGNOSIS — R1010 Upper abdominal pain, unspecified: Secondary | ICD-10-CM | POA: Insufficient documentation

## 2021-04-01 LAB — BASIC METABOLIC PANEL
Anion gap: 7 (ref 5–15)
BUN: 13 mg/dL (ref 6–20)
CO2: 23 mmol/L (ref 22–32)
Calcium: 7.7 mg/dL — ABNORMAL LOW (ref 8.9–10.3)
Chloride: 108 mmol/L (ref 98–111)
Creatinine, Ser: 0.71 mg/dL (ref 0.44–1.00)
GFR, Estimated: >60 mL/min (ref >60)
Glucose, Bld: 92 mg/dL (ref 70–99)
Potassium: 3.5 mmol/L (ref 3.5–5.1)
Sodium: 138 mmol/L (ref 135–145)

## 2021-04-01 LAB — CBC WITH DIFFERENTIAL/PLATELET
Abs Immature Granulocytes: 0.03 10*3/uL (ref 0.00–0.07)
Basophils Absolute: 0 10*3/uL (ref 0.0–0.1)
Basophils Relative: 0 %
Eosinophils Absolute: 0.4 10*3/uL (ref 0.0–0.5)
Eosinophils Relative: 5 %
HCT: 26.4 % — ABNORMAL LOW (ref 36.0–46.0)
Hemoglobin: 8.5 g/dL — ABNORMAL LOW (ref 12.0–15.0)
Immature Granulocytes: 0 %
Lymphocytes Relative: 16 %
Lymphs Abs: 1.4 10*3/uL (ref 0.7–4.0)
MCH: 29.3 pg (ref 26.0–34.0)
MCHC: 32.2 g/dL (ref 30.0–36.0)
MCV: 91 fL (ref 80.0–100.0)
Monocytes Absolute: 0.4 10*3/uL (ref 0.1–1.0)
Monocytes Relative: 5 %
Neutro Abs: 6.5 10*3/uL (ref 1.7–7.7)
Neutrophils Relative %: 74 %
Platelets: 347 10*3/uL (ref 150–400)
RBC: 2.9 MIL/uL — ABNORMAL LOW (ref 3.87–5.11)
RDW: 13.2 % (ref 11.5–15.5)
WBC: 8.8 10*3/uL (ref 4.0–10.5)
nRBC: 0 % (ref 0.0–0.2)

## 2021-04-01 LAB — LACTIC ACID, PLASMA: Lactic Acid, Venous: 0.7 mmol/L (ref 0.5–1.9)

## 2021-04-01 MED ORDER — VANCOMYCIN HCL 500 MG IV SOLR
INTRAVENOUS | Status: AC
  Filled 2021-04-01: qty 500

## 2021-04-01 MED ORDER — VANCOMYCIN HCL 10 G IV SOLR
500.0000 mg | Freq: Once | INTRAVENOUS | Status: AC
  Administered 2021-04-01: 500 mg via INTRAVENOUS
  Filled 2021-04-01: qty 500

## 2021-04-01 MED ORDER — IOHEXOL 300 MG/ML  SOLN
75.0000 mL | Freq: Once | INTRAMUSCULAR | Status: AC | PRN
  Administered 2021-04-01: 75 mL via INTRAVENOUS

## 2021-04-01 MED ORDER — VANCOMYCIN HCL 1500 MG/300ML IV SOLN
1500.0000 mg | Freq: Once | INTRAVENOUS | Status: DC
  Filled 2021-04-01: qty 300

## 2021-04-01 MED ORDER — VANCOMYCIN HCL IN DEXTROSE 1-5 GM/200ML-% IV SOLN
1000.0000 mg | INTRAVENOUS | Status: AC
  Administered 2021-04-01: 1000 mg via INTRAVENOUS
  Filled 2021-04-01: qty 200

## 2021-04-01 MED ORDER — SODIUM CHLORIDE 0.9 % IV SOLN: 1.5000 g | Freq: Once | INTRAVENOUS | Status: DC

## 2021-04-01 MED ORDER — SODIUM CHLORIDE 0.9 % IV SOLN
3.0000 g | Freq: Once | INTRAVENOUS | Status: AC
  Administered 2021-04-01: 3 g via INTRAVENOUS
  Filled 2021-04-01: qty 8

## 2021-04-01 MED ORDER — DEXTROSE 5 % IV SOLN: 1500.0000 mg | Freq: Once | INTRAVENOUS | Status: DC

## 2021-04-01 MED ORDER — VANCOMYCIN HCL IN DEXTROSE 1-5 GM/200ML-% IV SOLN: 1000.0000 mg | Freq: Once | INTRAVENOUS | Status: DC

## 2021-04-01 MED ORDER — VANCOMYCIN HCL 500 MG/100ML IV SOLN: 500.0000 mg | Freq: Once | INTRAVENOUS | Status: DC

## 2021-04-01 MED ORDER — VANCOMYCIN HCL 1500 MG/300ML IV SOLN: 1500.0000 mg | Freq: Once | INTRAVENOUS | Status: DC

## 2021-04-01 NOTE — ED Triage Notes (Signed)
C/o fever , redness around incision  sites from plastic surgery on 5/13, change in drainage in JP Drain x 1 day ago

## 2021-04-01 NOTE — ED Provider Notes (Addendum)
[name] EMERGENCY DEPARTMENT Provider Note   CSN: [id number] Arrival date & time: 04/01/21  0019     History Chief Complaint  Patient presents with  . Wound Check    [name] is a 55 y.o. female.  Patient presents to the emergency department for evaluation of possible surgical infection.  Patient had multiple areas of plastic surgery on May 13.  Patient has been running intermittent fevers.  She was seen at [facility] 2 days ago and had labs but did not stay for results.  She called her plastic surgeon and had Bactrim initiated, has taken 3 doses.  She was on a cephalosporin since surgery.  In the last day or so she has noticed a change in the fluid in her surgical drains, it now looks like pus.  Lower abdomen, where the drains are, is now painful, swollen and tender.  She reports that she has had a significant appetite change today and has had some nausea and dry heaving.        Past Medical History:  Diagnosis Date  . Asthma due to seasonal allergies    no inhaler  . History of diabetes mellitus, type II    per pt resolved after bariatric surgery 2017  . History of hypertension    per pt resolved after bariatric surgery 2017  . History of kidney stones   . History of obstructive sleep apnea    per pt hx osa w/ cpap,  retested after bariatric surgery 2017 result negative  . History of skin cancer    nose, not melanoma  . Hydronephrosis, right   . Hyperlipidemia   . MRSA carrier   . NAFL (nonalcoholic fatty liver)   . Osteoporosis   . Renal calculi    per CT 04-18-2019 bilateral , non-obstructive  . S/P bariatric surgery 08/03/2016   duodenal switch w/ hh repair  . Ureteral calculus, right   . Wears glasses     Patient Active Problem List   Diagnosis Date Noted  . Low back pain 12/09/2020  . 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 Headache 07/05/2020  . Osteomalacia 12/27/2019  . History of weight loss 11/28/2019  . 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 Localized osteoporosis without current pathological fracture 06/26/2019  . Hypocalcemia 06/26/2019  . Secondary hyperparathyroidism ([place]) 06/26/2019  . Multinodular goiter 06/26/2019  . Dysphagia 05/05/2019  . Osteoporosis 05/03/2019  . Memory loss 02/13/2019  . Elevated liver function tests 10/13/2017  . Epigastric abdominal pain 10/13/2017  . History of kidney stones 10/13/2017  . Hypertension 10/13/2017  . Status post bariatric surgery 09/03/2017  . Right ureteral calculus 08/31/2017  . Hydronephrosis with urinary obstruction due to ureteral calculus 08/31/2017  . Nephrolithiasis 08/31/2017  . Screen for colon cancer 07/26/2017  . Sleep apnea 10/06/2016  . Candidal intertrigo 09/21/2016  . H/O gastric bypass 09/21/2016  . Hiatal hernia 04/21/2016  . Elevated liver enzymes 09/04/2014  . Vitamin D deficiency 09/03/2014  . Routine general medical examination at a health care facility 01/11/2014    Past Surgical History:  Procedure Laterality Date  . BARIATRIC SURGERY  08-03-2016    dr Darnell Level @[facility] ,  [place]   LAPAROSCOPY DUODENAL SWITCH AND HIATAL HERNIA REPAIR  . BREAST BIOPSY Right 2017   FIBROCYSTIC CHANGES WITH CALCIFICATIONS  . CESAREAN SECTION  x2  last one 09-10-2000  . COLONOSCOPY WITH PROPOFOL N/A 09/11/2019   Procedure: COLONOSCOPY WITH PROPOFOL;  Surgeon: Jonathon Bellows, MD;  Location: [facility];  Service: Gastroenterology;  Laterality: N/A;  . CYSTOSCOPY WITH RETROGRADE PYELOGRAM, URETEROSCOPY AND STENT PLACEMENT Right 02-07-2012  @[facility]   . CYSTOSCOPY WITH STENT PLACEMENT Right 09/10/2017   Procedure: CYSTOSCOPY WITH STENT PLACEMENT;  Surgeon: Abbie Sons, MD;  Location: [facility];  Service: Urology;  Laterality: Right;  . CYSTOSCOPY/URETEROSCOPY/HOLMIUM LASER/STENT PLACEMENT Right 04/24/2019   Procedure: CYSTOSCOPY/URETEROSCOPY/STENT PLACEMENT/ RIGHT RETROGRADE;  Surgeon: Festus Aloe, MD;  Location: [facility];  Service: Urology;  Laterality: Right;  . DX LAPAROSCOPY/  EGD/  HIATAL HERNIA REPAIR CLOSURE OF INTERNAL MENSENTRY DEFECT  10-28-2017   @[facility] ,  [place]  . EXTRACORPOREAL SHOCK WAVE LITHOTRIPSY N/A 01/30/2019   Procedure: EXTRACORPOREAL SHOCK WAVE LITHOTRIPSY (ESWL) LEFT/ POSSIBLE RIGHT;  Surgeon: Festus Aloe, MD;  Location: [facility];  Service: Urology;  Laterality: N/A;  . EXTRACORPOREAL SHOCK WAVE LITHOTRIPSY Right 03/20/2019   Procedure: EXTRACORPOREAL SHOCK WAVE LITHOTRIPSY (ESWL);  Surgeon: Franchot Gallo, MD;  Location: [facility];  Service: Urology;  Laterality: Right;  . LAPAROSCOPIC CHOLECYSTECTOMY  10-15-2016   @[facility]  ,  [place]  . TONSILLECTOMY AND ADENOIDECTOMY  child  . URETEROSCOPY WITH HOLMIUM LASER LITHOTRIPSY Right 09/10/2017   Procedure: URETEROSCOPY WITH HOLMIUM LASER LITHOTRIPSY;  Surgeon: Abbie Sons, MD;  Location: [facility];  Service: Urology;  Laterality: Right;     OB History    Gravida  3   Para  2   Term      Preterm      AB  1   Living        SAB  1   IAB      Ectopic      Multiple      Live Births           Obstetric Comments  1st Menstrual Cycle:  ? 1st Pregnancy:  38        Family History  Problem Relation Age of Onset  . Alcohol abuse Mother   . Hyperlipidemia Mother   . Hypertension Mother   . Lymphoma Mother        primary site : brain  . Hyperlipidemia Father   . Hypertension Father   . Parkinson's disease Father   . Drug abuse Sister   . Cancer Maternal Grandmother        breast cancer  . Breast cancer Maternal Grandmother   . Liver cancer Paternal Grandfather   . Kidney cancer Neg Hx   . Prostate cancer Neg Hx     Social History   Tobacco Use  . Smoking status: Never Smoker  . Smokeless tobacco: Never Used  Vaping Use  . Vaping Use: Never used  Substance Use Topics  . Alcohol use: No    Alcohol/week: 0.0 standard drinks  . Drug use: Never    Home Medications Prior to Admission medications    Medication Sig Start Date End Date Taking? Authorizing Provider  HYDROcodone-acetaminophen (NORCO) 7.5-325 MG tablet Take 1 tablet by mouth every 6 (six) hours as needed. 03/13/21   [provider]  Multiple Vitamins-Minerals (BARIATRIC MULTIVITAMINS/IRON PO) Take by mouth.    [provider]  pantoprazole (PROTONIX) 20 MG tablet TAKE 1 TABLET BY MOUTH EVERY DAY 12/25/20   [name], FNP  phentermine (ADIPEX-P) 37.5 MG tablet Take 0.5 tablets by mouth daily. 06/05/20 12/26/20  [provider]  PREVIDENT 5000 BOOSTER PLUS 1.1 % PSTE Place onto teeth as directed. 08/23/20   [provider]  sertraline (ZOLOFT) 50 MG tablet Take 2 tablets by mouth  daily. 05/28/20 05/28/21  [provider]  sulfamethoxazole-trimethoprim (BACTRIM DS) 800-160 MG tablet Take 1 tablet by mouth 2 (two) times daily. 03/30/21   [provider]  ursodiol (ACTIGALL) 500 MG tablet Take 250 mg by mouth in the morning and at bedtime. Take two tablets by mouth in the morning & three tablets by mouth in the evening.    [provider]  Vitamin D, Ergocalciferol, (DRISDOL) 1.25 MG (50000 UNIT) CAPS capsule TAKE 1 CAPSULE BY MOUTH 1 TIME A WEEK 12/03/20   Shamleffer, Melanie Crazier, MD    Allergies    Latex, Tolmetin, Gallium nitrate, Insulin detemir, Nsaids, and Gadolinium derivatives  Review of Systems   Review of Systems  Constitutional: Positive for fever.  Gastrointestinal: Positive for nausea and vomiting.  Skin: Positive for color change and wound.  All other systems reviewed and are negative.   Physical Exam Updated Vital Signs BP (!) 148/98 (BP Location: Left Arm)   Pulse 75   Temp 98.4 F (36.9 C) (Oral)   Resp 17   Ht 5\' 7"  (1.702 m)   Wt 78.9 kg   LMP 07/11/2016 (Exact Date)   SpO2 99%   BMI 27.25 kg/m   Physical Exam Vitals and nursing note reviewed.  Constitutional:      General: She is not in acute distress.    Appearance: Normal  appearance. She is well-developed.  HENT:     Head: Normocephalic and atraumatic.     Right Ear: Hearing normal.     Left Ear: Hearing normal.     Nose: Nose normal.  Eyes:     Conjunctiva/sclera: Conjunctivae normal.     Pupils: Pupils are equal, round, and reactive to light.  Cardiovascular:     Rate and Rhythm: Regular rhythm.     Heart sounds: S1 normal and S2 normal. No murmur heard. No friction rub. No gallop.   Pulmonary:     Effort: Pulmonary effort is normal. No respiratory distress.     Breath sounds: Normal breath sounds.  Chest:     Chest wall: No tenderness.  Abdominal:     General: Bowel sounds are normal.     Palpations: Abdomen is soft.     Tenderness: There is no abdominal tenderness. There is no guarding or rebound. Negative signs include Murphy's sign and McBurney's sign.     Hernia: No hernia is present.  Musculoskeletal:        General: Normal range of motion.     Cervical back: Normal range of motion and neck supple.  Skin:    General: Skin is warm and dry.     Findings: No rash.     Comments: Posterior right upper arm surgical incision erythematous  Multiple areas of erythema and swelling of surgical incisions across lower abdomen  Small amount of cloudy viscous fluid and drains  Neurological:     Mental Status: She is alert and oriented to person, place, and time.     GCS: GCS eye subscore is 4. GCS verbal subscore is 5. GCS motor subscore is 6.     Cranial Nerves: No cranial nerve deficit.     Sensory: No sensory deficit.     Coordination: Coordination normal.  Psychiatric:        Speech: Speech normal.        Behavior: Behavior normal.        Thought Content: Thought content normal.         ED Results / Procedures / Treatments  Labs (all labs ordered are listed, but only abnormal results are displayed) Labs Reviewed  CBC WITH DIFFERENTIAL/PLATELET - Abnormal; Notable for the following components:      Result Value   RBC 2.90 (*)     Hemoglobin 8.5 (*)    HCT 26.4 (*)    All other components within normal limits  BASIC METABOLIC PANEL - Abnormal; Notable for the following components:   Calcium 7.7 (*)    All other components within normal limits  AEROBIC/ANAEROBIC CULTURE W GRAM STAIN (SURGICAL/DEEP WOUND)  LACTIC ACID, PLASMA    EKG None  Radiology CT ABDOMEN PELVIS W CONTRAST  Result Date: 04/01/2021 CLINICAL DATA:  Upper abdominal pain. Status post anterior abdominal soft tissues plastic surgery. Edema and pus coming from drains the last 2 days as well as fever. EXAM: CT ABDOMEN AND PELVIS WITH CONTRAST TECHNIQUE: Multidetector CT imaging of the abdomen and pelvis was performed using the standard protocol following bolus administration of intravenous contrast. CONTRAST:  56mL OMNIPAQUE IOHEXOL 300 MG/ML  SOLN COMPARISON:  CT abdomen pelvis 04/18/2019 FINDINGS: Lower chest: No acute abnormality. Hepatobiliary: No focal liver abnormality. Status post cholecystectomy. No biliary dilatation. Pancreas: No focal lesion. Normal pancreatic contour. No surrounding inflammatory changes. No main pancreatic ductal dilatation. Spleen: Normal in size without focal abnormality. Adrenals/Urinary Tract: No adrenal nodule bilaterally. Bilateral kidneys enhance symmetrically. Several calcified stones measuring up to 9 mm on the left and punctate on the right. No hydronephrosis. No hydroureter. The urinary bladder is unremarkable. On delayed imaging, there is no urothelial wall thickening and there are no filling defects in the opacified portions of the bilateral collecting systems or ureters. Stomach/Bowel: Surgical changes related to a gastric sleeve formation and duodenal switch. Stomach is within normal limits. No evidence of bowel wall thickening or dilatation. Appendix appears normal. Vascular/Lymphatic: No significant vascular findings are present. No enlarged abdominal or pelvic lymph nodes. Reproductive: Uterus and bilateral adnexa are  unremarkable. Other: No intraperitoneal free fluid. No intraperitoneal free gas. No organized fluid collection. Musculoskeletal: Subcutaneus soft tissue edema and fat stranding along the anterior and lateral subcutaneus soft tissues. What is first thought to represent a thick-walled 1 cm fluid collection within the umbilical region is likely related to postsurgical changes in the setting of plication of the rectus abdominus muscles together in this plastic surgery setting. No definite organized fluid collection identified. No subcutaneus soft tissue emphysema. Couple lower/mid abdominal surgical drains are noted. No suspicious lytic or blastic osseous lesions. No acute displaced fracture. Multilevel degenerative changes of the spine. IMPRESSION: 1. No definite organized fluid collection within the subcutaneus soft tissues in a patient status post plastic surgery. Subcutaneus soft tissue edema and fat stranding may be postsurgical with superimposed infection not excluded. 2. No acute intra-abdominal or intrapelvic abnormality. 3. Nonobstructive bilateral nephrolithiasis. Electronically Signed   By: Iven Finn M.D.   On: 04/01/2021 04:00    Procedures Procedures   Medications Ordered in ED Medications  dalbavancin (DALVANCE) 1,500 mg in dextrose 5 % 500 mL IVPB (has no administration in time range)  iohexol (OMNIPAQUE) 300 MG/ML solution 75 mL (75 mLs Intravenous Contrast Given 04/01/21 0258)    ED Course  I have reviewed the triage vital signs and the nursing notes.  Pertinent labs & imaging results that were available during my care of the patient were reviewed by me and considered in my medical decision making (see chart for details).    MDM Rules/Calculators/A&P  Patient presents with complaints of redness and some drainage from surgical sites.  Patient had plastic surgery on May 13.  She has been on antibiotics since surgery.  She has developed increasing redness  at the surgical incisions and there is some cloudy drainage into her JP drains.  Patient was seen at [facility] 2 days ago.  Lab work at that time was reassuring including normal white blood cell count and normal lactic acid.  These labs were repeated today and once again normal.  White blood cell count is 8.8, lactic acid is 0.7.  Patient is not tachycardic, tachypneic and is afebrile.  This is reassuring that she is not septic.  A CT scan was performed and does not show any fluid collections at the surgical site.  No gas formation.  This is most consistent with superficial cellulitis and not deep infection.  Patient has had previous bariatric surgery.  There is concern that she is not absorbing enough of the antibiotic orally for adequate treatment.  She also had suboptimal treatment with a cephalosporin initially, was recently changed to Bactrim but has only had 3 doses.  Because of the amount of infection, I do believe she requires parenteral treatment.  Patient would prefer not to be hospitalized.  As she is not septic and appears well, treatment with Dalvance seems to be a good option that would prevent her from being admitted to the hospital for treatment.  Visitation seems to suggest that this is likely staph.  Work-up has essentially ruled out deep infection.  Unfortunately, Dalvance administration is not supported by pharmacy guidelines because she has postoperative.  I contacted Dr. Kate Sable, the partner of Dr. Dellia Nims who is on-call tonight.  He agreed that if the patient did not look toxic or septic, that initiating antibiotics in the department and discharging her to go to the office this morning would be appropriate.  She can be seen after 8 AM.  If she needs the wound opened or if she needs hospitalization that can be arranged through the office.  Final Clinical Impression(s) / ED Diagnoses Final diagnoses:  Cellulitis, wound, post-operative    Rx / DC Orders ED Discharge Orders         Ordered     Ambulatory referral to Infectious Disease       Comments: Cellulitis patient:  Received dalbavancin on 04/01/2021.   04/01/21 9211           Orpah Greek, MD 04/01/21 9417    Orpah Greek, MD 04/01/21 [phone]

## 2021-04-01 NOTE — Discharge Instructions (Addendum)
Go to Dr. Janalyn Rouse office this morning for a recheck.

## 2021-04-04 LAB — CULTURE, BLOOD (ROUTINE X 2)
Culture: NO GROWTH
Culture: NO GROWTH
Special Requests: ADEQUATE

## 2021-04-06 LAB — AEROBIC/ANAEROBIC CULTURE W GRAM STAIN (SURGICAL/DEEP WOUND)

## 2021-04-07 ENCOUNTER — Telehealth: Payer: Self-pay | Admitting: Emergency Medicine

## 2021-04-07 ENCOUNTER — Encounter: Payer: Self-pay | Admitting: Family

## 2021-04-07 NOTE — Telephone Encounter (Signed)
Post ED Visit - Positive Culture Follow-up  Culture report reviewed by antimicrobial stewardship pharmacist: [facility] Team []  Elenor Quinones, Pharm.D. []  Heide Guile, Pharm.D., BCPS AQ-ID []  Parks Neptune, Pharm.D., BCPS []  Alycia Rossetti, Pharm.D., BCPS []  [employer], Pharm.D., BCPS, AAHIVP []  Legrand Como, Pharm.D., BCPS, AAHIVP [x]  Salome Arnt, PharmD, BCPS []  Johnnette Gourd, PharmD, BCPS []  Hughes Better, PharmD, BCPS []  Leeroy Cha, PharmD []  Laqueta Linden, PharmD, BCPS []  Albertina Parr, PharmD  [facility] Team []  Leodis Sias, PharmD []  Lindell Spar, PharmD []  Royetta Asal, PharmD []  Graylin Shiver, Rph []  Rema Fendt) Glennon Mac, PharmD []  Arlyn Dunning, PharmD []  Netta Cedars, PharmD []  Dia Sitter, PharmD []  Leone Haven, PharmD []  Gretta Arab, PharmD []  Theodis Shove, PharmD []  Peggyann Juba, PharmD []  Reuel Boom, PharmD   Positive wound culture Treated with bactrim DS, organism sensitive to the same and no further patient follow-up is required at this time.  Hazle Nordmann 04/07/2021, 9:58 AM

## 2021-04-20 NOTE — Telephone Encounter (Signed)
Please follow up with pt regarding scheduling Prolia injection.   Thanks!

## 2021-04-21 NOTE — Telephone Encounter (Signed)
ATC to schedule prolia, VM box was full

## 2021-05-13 NOTE — Telephone Encounter (Signed)
Please f/u with pt to see if they wish to proceed with Prolia therapy.   Thank you!

## 2021-05-15 NOTE — Telephone Encounter (Signed)
Mailbox full

## 2021-05-16 ENCOUNTER — Encounter: Payer: Self-pay | Admitting: Family

## 2021-05-27 NOTE — Telephone Encounter (Signed)
Pt archived in [email or web address]  Please advise if patient/provider wishes to proceed with Prolia treatment.

## 2021-05-29 ENCOUNTER — Telehealth: Payer: Self-pay | Admitting: Family

## 2021-05-29 NOTE — Telephone Encounter (Signed)
Patient requested an appointment through [facility] for abdominal pain.Patient was called; she was offered an appointment with our female provider on Friday, patient stated she would rather see [name]. Explained to her that she did not have anything available this week or next. Patient was offered another appointment with a female provider and she took the appointment for 06/03/2021. Patient was asked what symptoms she had and she stated she has bad pain around her waist. Patient was asked to hold and be transferred to a nurse ([facility]). Patient stated she was not going to a urgent care.  After hanging up with patient front office went into her chart to document call. It was notice that patient tested positive for covid on 05/16/2021. Patient denied being tested or any symptoms when asked. Concern is now, since she had covid could her issues be worse than patient thinks.

## 2021-05-30 NOTE — Telephone Encounter (Signed)
FYI  Pt seeing you next week for GI sxs She declines going to UC

## 2021-06-02 ENCOUNTER — Encounter: Payer: Self-pay | Admitting: Emergency Medicine

## 2021-06-02 ENCOUNTER — Ambulatory Visit
Admission: EM | Admit: 2021-06-02 | Discharge: 2021-06-02 | Disposition: A | Discharge status: Home / Self Care | Payer: 59 | Attending: Emergency Medicine | Admitting: Emergency Medicine

## 2021-06-02 ENCOUNTER — Other Ambulatory Visit: Payer: Self-pay

## 2021-06-02 DIAGNOSIS — L03113 Cellulitis of right upper limb: Secondary | ICD-10-CM | POA: Diagnosis not present

## 2021-06-02 MED ORDER — DOXYCYCLINE HYCLATE 100 MG PO CAPS: 100.0000 mg | ORAL_CAPSULE | Freq: Two times a day (BID) | ORAL | 0 refills | Status: AC

## 2021-06-02 MED ORDER — CEFTRIAXONE SODIUM 1 G IJ SOLR
1.0000 g | Freq: Once | INTRAMUSCULAR | Status: AC
  Administered 2021-06-02: 1 g via INTRAMUSCULAR

## 2021-06-02 MED ORDER — CEPHALEXIN 500 MG PO CAPS: 1000.0000 mg | ORAL_CAPSULE | Freq: Two times a day (BID) | ORAL | 0 refills | Status: AC

## 2021-06-02 NOTE — Discharge Instructions (Addendum)
May take 1000 mg of Tylenol 3-4 times a day as needed for pain.  I have given you 1 g of Rocephin.  Finish the doxycycline and Keflex, even if you feel better.  Warm compresses.

## 2021-06-02 NOTE — ED Triage Notes (Signed)
PT had skin removal surgery in May, reports repeat drain infection in abdomen.  Today, presents with right upper arm redness and swelling along scar from May surgery. Started yesterday.

## 2021-06-02 NOTE — ED Provider Notes (Signed)
HPI  SUBJECTIVE:  [name] is a 55 y.o. female who presents with constant dull, throbbing pain, swelling, erythema along surgical scar right medial arm starting last night.  Reports some itching, increased temperature in this area.  States that she felt feverish, but did not check her temperature.  She denies body aches, trauma to the area.  She has no sensation at the surgical site since skin reduction surgery on 03/21/2021.  She has had similar symptoms before on 5/24 when she was found to have a postoperative, pansensitive staph infection at her drains. This Required 10 days of Bactrim, 7 days of cephalosporin and two IV antibiotics.  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  Diabetes, hypertension resolved with weight loss.  [id number], Yvetta Coder, FNP    Past Medical History:  Diagnosis Date   Asthma due to seasonal allergies    no inhaler   History of diabetes mellitus, type II    per pt resolved after bariatric surgery 2017   History of hypertension    per pt resolved after bariatric surgery 2017   History of kidney stones    History of obstructive sleep apnea    per pt hx osa w/ cpap,  retested after bariatric surgery 2017 result negative   History of skin cancer    nose, not melanoma   Hydronephrosis, right    Hyperlipidemia    MRSA carrier    NAFL (nonalcoholic fatty liver)    Osteoporosis    Renal calculi    per CT 04-18-2019 bilateral , non-obstructive   S/P bariatric surgery 08/03/2016   duodenal switch w/ hh repair   Ureteral calculus, right    Wears glasses     Past Surgical History:  Procedure Laterality Date   BARIATRIC SURGERY  08-03-2016    dr Darnell Level '@[facility] ,  [place]   LAPAROSCOPY DUODENAL SWITCH AND HIATAL HERNIA REPAIR   BREAST BIOPSY Right 2017   FIBROCYSTIC CHANGES WITH CALCIFICATIONS   CESAREAN SECTION  x2  last one 09-10-2000   COLONOSCOPY WITH PROPOFOL N/A 09/11/2019   Procedure: COLONOSCOPY  WITH PROPOFOL;  Surgeon: Jonathon Bellows, MD;  Location: [facility] ENDOSCOPY;  Service: Gastroenterology;  Laterality: N/A;   CYSTOSCOPY WITH RETROGRADE PYELOGRAM, URETEROSCOPY AND STENT PLACEMENT Right 02-07-2012  '@[facility]'$    CYSTOSCOPY WITH STENT PLACEMENT Right 09/10/2017   Procedure: CYSTOSCOPY WITH STENT PLACEMENT;  Surgeon: Abbie Sons, MD;  Location: [facility] ORS;  Service: Urology;  Laterality: Right;   CYSTOSCOPY/URETEROSCOPY/HOLMIUM LASER/STENT PLACEMENT Right 04/24/2019   Procedure: CYSTOSCOPY/URETEROSCOPY/STENT PLACEMENT/ RIGHT RETROGRADE;  Surgeon: Festus Aloe, MD;  Location: [facility];  Service: Urology;  Laterality: Right;   DX LAPAROSCOPY/  EGD/  HIATAL HERNIA REPAIR CLOSURE OF INTERNAL MENSENTRY DEFECT  10-28-2017   '@[facility]'$ ,  [place]   EXTRACORPOREAL SHOCK WAVE LITHOTRIPSY N/A 01/30/2019   Procedure: EXTRACORPOREAL SHOCK WAVE LITHOTRIPSY (ESWL) LEFT/ POSSIBLE RIGHT;  Surgeon: Festus Aloe, MD;  Location: [facility] OR[facility];  Service: Urology;  Laterality: N/A;   EXTRACORPOREAL SHOCK WAVE LITHOTRIPSY Right 03/20/2019   Procedure: EXTRACORPOREAL SHOCK WAVE LITHOTRIPSY (ESWL);  Surgeon: Franchot Gallo, MD;  Location: [facility];  Service: Urology;  Laterality: Right;   LAPAROSCOPIC CHOLECYSTECTOMY  10-15-2016   '@[facility]'$  ,  [place]   TONSILLECTOMY AND ADENOIDECTOMY  child   URETEROSCOPY WITH HOLMIUM LASER LITHOTRIPSY Right 09/10/2017   Procedure: URETEROSCOPY WITH HOLMIUM LASER LITHOTRIPSY;  Surgeon: Abbie Sons, MD;  Location: [facility] ORS;  Service: Urology;  Laterality: Right;  Family History  Problem Relation Age of Onset   Alcohol abuse Mother    Hyperlipidemia Mother    Hypertension Mother    Lymphoma Mother        primary site : brain   Hyperlipidemia Father    Hypertension Father    Parkinson's disease Father    Drug abuse Sister    Cancer Maternal Grandmother        breast cancer   Breast cancer Maternal Grandmother    Liver cancer Paternal Grandfather    Kidney  cancer Neg Hx    Prostate cancer Neg Hx     Social History   Tobacco Use   Smoking status: Never   Smokeless tobacco: Never  Vaping Use   Vaping Use: Never used  Substance Use Topics   Alcohol use: No    Alcohol/week: 0.0 standard drinks   Drug use: Never    No current facility-administered medications for this encounter.  Current Outpatient Medications:    cephALEXin (KEFLEX) 500 MG capsule, Take 2 capsules (1,000 mg total) by mouth 2 (two) times daily for 7 days., Disp: 28 capsule, Rfl: 0   doxycycline (VIBRAMYCIN) 100 MG capsule, Take 1 capsule (100 mg total) by mouth 2 (two) times daily for 7 days., Disp: 14 capsule, Rfl: 0   HYDROcodone-acetaminophen (NORCO) 7.5-325 MG tablet, Take 1 tablet by mouth every 6 (six) hours as needed., Disp: , Rfl:    Multiple Vitamins-Minerals (BARIATRIC MULTIVITAMINS/IRON PO), Take by mouth., Disp: , Rfl:    pantoprazole (PROTONIX) 20 MG tablet, TAKE 1 TABLET BY MOUTH EVERY DAY, Disp: 90 tablet, Rfl: 1   phentermine (ADIPEX-P) 37.5 MG tablet, Take 0.5 tablets by mouth daily., Disp: , Rfl:    PREVIDENT 5000 BOOSTER PLUS 1.1 % PSTE, Place onto teeth as directed., Disp: , Rfl:    ursodiol (ACTIGALL) 500 MG tablet, Take 250 mg by mouth in the morning and at bedtime. Take two tablets by mouth in the morning & three tablets by mouth in the evening., Disp: , Rfl:    Vitamin D, Ergocalciferol, (DRISDOL) 1.25 MG (50000 UNIT) CAPS capsule, TAKE 1 CAPSULE BY MOUTH 1 TIME A WEEK, Disp: 12 capsule, Rfl: 0  Allergies  Allergen Reactions   Latex Shortness Of Breath and Cough   Tolmetin Hives   Gallium Nitrate Hives    AND Gadolinium Derivatives    Insulin Detemir Hives and Swelling    Lump at injection site   Nsaids Hives    Per pt stated has to take ibuprofen with a antihistimine   Gadolinium Derivatives Hives    Pt c/o itching after contrast injection 03/15/19 @ 8:45am. [facility] Per Dr. Kathlene Cote document this as allergic reaction.      ROS  As noted in  HPI.   Physical Exam  BP (!) 154/84   Pulse 70   Temp 99.9 F (37.7 C) (Oral)   Resp 16   LMP 07/11/2016 (Exact Date)   SpO2 96%   Constitutional: Well developed, well nourished, no acute distress Eyes:  EOMI, conjunctiva normal bilaterally HENT: Normocephalic, atraumatic,mucus membranes moist Respiratory: Normal inspiratory effort Cardiovascular: Normal rate GI: nondistended skin: 11 x 9 cm tender area of erythema, edema, induration at the surgical site right medial bicep.  Positive increased temperature.  No expressible purulent drainage.  Scar appears well-healed..  No subcutaneous crepitus.  Marked area of erythema with a marker for reference.   Lymph: No axillary lymphadenopathy Musculoskeletal: no deformities Neurologic: Alert & oriented x 3, no focal  neuro deficits Psychiatric: Speech and behavior appropriate   ED Course   Medications  cefTRIAXone (ROCEPHIN) injection 1 g (1 g Intramuscular Given 06/02/21 0856)    No orders of the defined types were placed in this encounter.   No results found for this or any previous visit (from the past 24 hour(s)). No results found.  ED Clinical Impression  1. Cellulitis of right upper extremity      ED Assessment/Plan  Patient with cellulitis.  Does not appear to be an abscess or anything I&D at this time.  Will give 1 g of Rocephin because she does not absorb antibiotics well due to the gastric bypass surgery.  Home with Doxy given history of MRSA and Keflex for 7 days, Tylenol as needed for pain.  Follow-up with plastics, to the ER if she gets worse.  Discussed MDM, treatment plan, and plan for follow-up with patient. Discussed sn/sx that should prompt return to the ED. patient agrees with plan.   Meds ordered this encounter  Medications   cefTRIAXone (ROCEPHIN) injection 1 g   doxycycline (VIBRAMYCIN) 100 MG capsule    Sig: Take 1 capsule (100 mg total) by mouth 2 (two) times daily for 7 days.    Dispense:  14  capsule    Refill:  0   cephALEXin (KEFLEX) 500 MG capsule    Sig: Take 2 capsules (1,000 mg total) by mouth 2 (two) times daily for 7 days.    Dispense:  28 capsule    Refill:  0       *This clinic note was created using [employer]. Therefore, there may be occasional mistakes despite careful proofreading.  ?    Melynda Ripple, MD 06/02/21 1042

## 2021-06-03 ENCOUNTER — Ambulatory Visit: Payer: 59 | Admitting: Internal Medicine

## 2021-06-03 ENCOUNTER — Encounter: Payer: Self-pay | Admitting: Internal Medicine

## 2021-06-03 ENCOUNTER — Other Ambulatory Visit: Payer: Self-pay

## 2021-06-03 VITALS — BP 130/84 | HR 72 | Temp 98.5°F | Ht 67.0 in | Wt 174.6 lb

## 2021-06-03 DIAGNOSIS — L03113 Cellulitis of right upper limb: Secondary | ICD-10-CM

## 2021-06-03 DIAGNOSIS — R1084 Generalized abdominal pain: Secondary | ICD-10-CM | POA: Diagnosis not present

## 2021-06-03 DIAGNOSIS — N200 Calculus of kidney: Secondary | ICD-10-CM

## 2021-06-03 DIAGNOSIS — Z22322 Carrier or suspected carrier of Methicillin resistant Staphylococcus aureus: Secondary | ICD-10-CM

## 2021-06-03 DIAGNOSIS — Z09 Encounter for follow-up examination after completed treatment for conditions other than malignant neoplasm: Secondary | ICD-10-CM

## 2021-06-03 MED ORDER — MUPIROCIN 2 % EX OINT: 1.0000 "application " | TOPICAL_OINTMENT | Freq: Two times a day (BID) | CUTANEOUS | 0 refills | Status: DC

## 2021-06-03 NOTE — Progress Notes (Signed)
Patient states she is having abdominal pain, slightly to the left and above the umbilical area. Pain ongoing for 2 weeks with no known injury or changes at time of onset. Pain rated 10/10 stabbing pain.

## 2021-06-03 NOTE — Progress Notes (Addendum)
Chief Complaint  Patient presents with   Pain    Around the waist    Acute visit  03/21/21 pt had excess skin removal from b/l tricep areas and abdomen with scars in all areas 06/02/21 went to urgent care for cellulitis right upper arm given keflex and doxycycline had not started yet and rocephine x 1 given urgent care and after surgery she had 3 rounds of Abx and iv Abx and had cT abdomen/pelvis 04/01/21 which did not show abscess currently 10/10 today 7-8/10 ab pain around umbilical belly button x 2 weeks no redness stabbing pain she bears down to use bathroom and not sure if could have hernia and also h/o 9 mm keft kidney stone but b/l (f/u Dr. Junious Silk) noted ct 04/01/21 pressing in abdomen helps sitting and coughing worse and bearing down worse. Standing helps pain  Plastics is Dr. Norman Herrlich has not contacted them rec she do today  +n/v yesterday has left over phenerghan and yesterday + low grade fever 99.9 and redness to tricpes right arm and hardness.  Denies constipation/diarrhea  She has leftover oxycodone not tried for pain  H/o MRSA in nose not tried bactroban yet  Review of Systems  Constitutional:  Positive for fever. Negative for weight loss.  HENT:  Negative for hearing loss.   Eyes:  Negative for blurred vision.  Cardiovascular:  Negative for chest pain.  Gastrointestinal:  Positive for abdominal pain. Negative for constipation, diarrhea, nausea and vomiting.  Skin:  Positive for rash.  Past Medical History:  Diagnosis Date   Asthma due to seasonal allergies    no inhaler   History of diabetes mellitus, type II    per pt resolved after bariatric surgery 2017   History of hypertension    per pt resolved after bariatric surgery 2017   History of kidney stones    History of obstructive sleep apnea    per pt hx osa w/ cpap,  retested after bariatric surgery 2017 result negative   History of skin cancer    nose, not melanoma   Hydronephrosis, right     Hyperlipidemia    MRSA carrier    NAFL (nonalcoholic fatty liver)    Osteoporosis    Renal calculi    per CT 04-18-2019 bilateral , non-obstructive   S/P bariatric surgery 08/03/2016   duodenal switch w/ hh repair   Ureteral calculus, right    Wears glasses    Past Surgical History:  Procedure Laterality Date   BARIATRIC SURGERY  08-03-2016    dr Darnell Level '@[facility] ,  [place]   LAPAROSCOPY DUODENAL SWITCH AND HIATAL HERNIA REPAIR   BREAST BIOPSY Right 2017   FIBROCYSTIC CHANGES WITH CALCIFICATIONS   CESAREAN SECTION  x2  last one 09-10-2000   COLONOSCOPY WITH PROPOFOL N/A 09/11/2019   Procedure: COLONOSCOPY WITH PROPOFOL;  Surgeon: Jonathon Bellows, MD;  [facility];  Service: Gastroenterology;  Laterality: N/A;   CYSTOSCOPY WITH RETROGRADE PYELOGRAM, URETEROSCOPY AND STENT PLACEMENT Right 02-07-2012  '@[facility]'$    CYSTOSCOPY WITH STENT PLACEMENT Right 09/10/2017   Procedure: CYSTOSCOPY WITH STENT PLACEMENT;  Surgeon: Abbie Sons, MD;  Location: [facility] ORS;  Service: Urology;  Laterality: Right;   CYSTOSCOPY/URETEROSCOPY/HOLMIUM LASER/STENT PLACEMENT Right 04/24/2019   Procedure: CYSTOSCOPY/URETEROSCOPY/STENT PLACEMENT/ RIGHT RETROGRADE;  Surgeon: Festus Aloe, MD;  Location: [facility];  Service: Urology;  Laterality: Right;   DX LAPAROSCOPY/  EGD/  HIATAL HERNIA REPAIR CLOSURE OF INTERNAL MENSENTRY DEFECT  10-28-2017   '@[facility]'$ ,  [employer]  EXTRACORPOREAL SHOCK WAVE LITHOTRIPSY N/A 01/30/2019   Procedure: EXTRACORPOREAL SHOCK WAVE LITHOTRIPSY (ESWL) LEFT/ POSSIBLE RIGHT;  Surgeon: Festus Aloe, MD;  Location: [facility];  Service: Urology;  Laterality: N/A;   EXTRACORPOREAL SHOCK WAVE LITHOTRIPSY Right 03/20/2019   Procedure: EXTRACORPOREAL SHOCK WAVE LITHOTRIPSY (ESWL);  Surgeon: Franchot Gallo, MD;  Location: [facility];  Service: Urology;  Laterality: Right;   LAPAROSCOPIC CHOLECYSTECTOMY  10-15-2016   '@[facility]'$  ,  [place]   TONSILLECTOMY AND ADENOIDECTOMY  child    URETEROSCOPY WITH HOLMIUM LASER LITHOTRIPSY Right 09/10/2017   Procedure: URETEROSCOPY WITH HOLMIUM LASER LITHOTRIPSY;  Surgeon: Abbie Sons, MD;  Location: [facility] ORS;  Service: Urology;  Laterality: Right;   Family History  Problem Relation Age of Onset   Alcohol abuse Mother    Hyperlipidemia Mother    Hypertension Mother    Lymphoma Mother        primary site : brain   Hyperlipidemia Father    Hypertension Father    Parkinson's disease Father    Drug abuse Sister    Cancer Maternal Grandmother        breast cancer   Breast cancer Maternal Grandmother    Liver cancer Paternal Grandfather    Kidney cancer Neg Hx    Prostate cancer Neg Hx    Social History   Socioeconomic History   Marital status: Single    Spouse name: Not on file   Number of children: 2   Years of education: 14   Highest education level: Not on file  Occupational History   Occupation: Mudlogger: [employer]   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 She lives in [place] with her two daughter. 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 She enjoys the outdoors, gardening.    Right handed   Two story home   Takes caffeine   Social Determinants of Health   Financial Resource Strain: Not on file  Food Insecurity: Not on file  Transportation Needs: Not on file  Physical Activity: Not on file  Stress: Not on file  Social Connections: Not on  file  Intimate Partner Violence: Not on file   Current Meds  Medication Sig   CALCIUM PO Take by mouth.   Multiple Vitamins-Minerals (BARIATRIC MULTIVITAMINS/IRON PO) Take by mouth.   mupirocin ointment (BACTROBAN) 2 % Apply 1 application topically 2 (two) times daily. X 5 days nose   PREVIDENT 5000 BOOSTER PLUS 1.1 % PSTE Place onto teeth as directed.   sertraline (ZOLOFT) 50 MG tablet Take 2 tablets by mouth daily.   ursodiol (ACTIGALL) 500 MG tablet Take 250 mg by mouth in the morning and at bedtime. Take two tablets by mouth in the morning & three tablets by mouth in the evening.   vitamin A 3 MG (10000 UNITS) capsule Take 10,000 Units by mouth daily.   Vitamin D, Ergocalciferol, (DRISDOL)  1.25 MG (50000 UNIT) CAPS capsule TAKE 1 CAPSULE BY MOUTH 1 TIME A WEEK   Allergies  Allergen Reactions   Latex Shortness Of Breath and Cough   Tolmetin Hives   Gallium Nitrate Hives    AND Gadolinium Derivatives    Insulin Detemir Hives and Swelling    Lump at injection site   Nsaids Hives    Per pt stated has to take ibuprofen with a antihistimine   Gadolinium Derivatives Hives    Pt c/o itching after contrast injection 03/15/19 @ 8:45am. [facility] Per Dr. Kathlene Cote document this as allergic reaction.    Recent Results (from the past 2160 hour(s))  Comprehensive metabolic panel     Status: Abnormal   Collection Time: 03/29/21 11:25 PM  Result Value Ref Range   Sodium 137 135 - 145 mmol/L   Potassium 3.5 3.5 - 5.1 mmol/L   Chloride 107 98 - 111 mmol/L   CO2 23 22 - 32 mmol/L   Glucose, Bld 109 (H) 70 - 99 mg/dL    Comment: Glucose reference range applies only to samples taken after fasting for at least 8 hours.   BUN 14 6 - 20 mg/dL   Creatinine, Ser 0.73 0.44 - 1.00 mg/dL   Calcium 7.5 (L) 8.9 - 10.3 mg/dL   Total Protein 6.2 (L) 6.5 - 8.1 g/dL   Albumin 2.8 (L) 3.5 - 5.0 g/dL   AST 22 15 - 41 U/L   ALT 20 0 - 44 U/L   Alkaline Phosphatase 136 (H) 38 - 126 U/L   Total Bilirubin 0.5 0.3 -  1.2 mg/dL   GFR, Estimated >60 >60 mL/min    Comment: (NOTE) Calculated using the CKD-EPI Creatinine Equation (2021)    Anion gap 7 5 - 15    Comment: Performed at [facility], [address] [place], [place] [address]  Lactic acid, plasma     Status: None   Collection Time: 03/29/21 11:25 PM  Result Value Ref Range   Lactic Acid, Venous 0.7 0.5 - 1.9 mmol/L    Comment: Performed at [facility], [address] [place], [place] [address]  CBC with Differential     Status: Abnormal   Collection Time: 03/29/21 11:25 PM  Result Value Ref Range   WBC 8.5 4.0 - 10.5 K/uL   RBC 3.16 (L) 3.87 - 5.11 MIL/uL   Hemoglobin 9.1 (L) 12.0 - 15.0 g/dL   HCT 28.5 (L) 36.0 - 46.0 %   MCV 90.2 80.0 - 100.0 fL   MCH 28.8 26.0 - 34.0 pg   MCHC 31.9 30.0 - 36.0 g/dL   RDW 13.2 11.5 - 15.5 %   Platelets 344 150 - 400 K/uL   nRBC 0.0 0.0 - 0.2 %   Neutrophils Relative % 75 %   Neutro Abs 6.3 1.7 - 7.7 K/uL   Lymphocytes Relative 15 %   Lymphs Abs 1.2 0.7 - 4.0 K/uL   Monocytes Relative 7 %   Monocytes Absolute 0.6 0.1 - 1.0 K/uL   Eosinophils Relative 3 %   Eosinophils Absolute 0.3 0.0 - 0.5 K/uL   Basophils Relative 0 %   Basophils Absolute 0.0 0.0 - 0.1 K/uL   Immature Granulocytes 0 %   Abs Immature Granulocytes 0.02 0.00 - 0.07 K/uL    Comment: Performed at [facility], [address] [place], [place] [address]  Blood culture (routine x 2)     Status: None   Collection Time: 03/29/21 11:26 PM  Specimen: BLOOD  Result Value Ref Range   Specimen Description BLOOD LEFT ANTECUBITAL    Special Requests      BOTTLES DRAWN AEROBIC AND ANAEROBIC Blood Culture adequate volume   Culture      NO GROWTH 6 DAYS Performed at [facility], [address] [place], [place] [address]    Report Status 04/04/2021 FINAL   Blood culture (routine x 2)     Status: None   Collection Time: 03/29/21 11:26 PM   Specimen: BLOOD  Result Value Ref Range   Specimen  Description BLOOD BLOOD LEFT FOREARM    Special Requests      BOTTLES DRAWN AEROBIC AND ANAEROBIC Blood Culture results may not be optimal due to an excessive volume of blood received in culture bottles   Culture      NO GROWTH 6 DAYS Performed at [facility], [address] [place], [place] [address]    Report Status 04/04/2021 FINAL   CBC with Differential/Platelet     Status: Abnormal   Collection Time: 04/01/21  1:27 AM  Result Value Ref Range   WBC 8.8 4.0 - 10.5 K/uL   RBC 2.90 (L) 3.87 - 5.11 MIL/uL   Hemoglobin 8.5 (L) 12.0 - 15.0 g/dL   HCT 26.4 (L) 36.0 - 46.0 %   MCV 91.0 80.0 - 100.0 fL   MCH 29.3 26.0 - 34.0 pg   MCHC 32.2 30.0 - 36.0 g/dL   RDW 13.2 11.5 - 15.5 %   Platelets 347 150 - 400 K/uL   nRBC 0.0 0.0 - 0.2 %   Neutrophils Relative % 74 %   Neutro Abs 6.5 1.7 - 7.7 K/uL   Lymphocytes Relative 16 %   Lymphs Abs 1.4 0.7 - 4.0 K/uL   Monocytes Relative 5 %   Monocytes Absolute 0.4 0.1 - 1.0 K/uL   Eosinophils Relative 5 %   Eosinophils Absolute 0.4 0.0 - 0.5 K/uL   Basophils Relative 0 %   Basophils Absolute 0.0 0.0 - 0.1 K/uL   Immature Granulocytes 0 %   Abs Immature Granulocytes 0.03 0.00 - 0.07 K/uL    Comment: Performed at [facility], [address] [place], [place] [id number]  Basic metabolic panel     Status: Abnormal   Collection Time: 04/01/21  1:27 AM  Result Value Ref Range   Sodium 138 135 - 145 mmol/L   Potassium 3.5 3.5 - 5.1 mmol/L   Chloride 108 98 - 111 mmol/L   CO2 23 22 - 32 mmol/L   Glucose, Bld 92 70 - 99 mg/dL    Comment: Glucose reference range applies only to samples taken after fasting for at least 8 hours.   BUN 13 6 - 20 mg/dL   Creatinine, Ser 0.71 0.44 - 1.00 mg/dL   Calcium 7.7 (L) 8.9 - 10.3 mg/dL   GFR, Estimated >60 >60 mL/min    Comment: (NOTE) Calculated using the CKD-EPI Creatinine Equation (2021)    Anion gap 7 5 - 15    Comment: Performed at [facility], [address] [place], [place] [address]  Lactic acid, plasma     Status: None   Collection Time: 04/01/21  1:27 AM  Result Value Ref Range   Lactic Acid, Venous 0.7 0.5 - 1.9 mmol/L    Comment: Performed at [facility], [address] [place], [place] [address]  Aerobic/Anaerobic Culture w Gram Stain (surgical/deep wound)  Status: None   Collection Time: 04/01/21  1:27 AM   Specimen: Wound  Result Value Ref Range   Specimen Description      WOUND ABDOMEN Performed at [facility], [address] [place], [place] [address]    Special Requests      SURGICAL DRAIN Performed at [facility], [address] [place], [place] [address]    Gram Stain      FEW WBC PRESENT,BOTH PMN AND MONONUCLEAR RARE GRAM POSITIVE COCCI IN PAIRS    Culture      ABUNDANT STAPHYLOCOCCUS AUREUS ABUNDANT CORYNEBACTERIUM SPECIES Standardized susceptibility testing for this organism is not available. NO ANAEROBES ISOLATED Performed at [facility], [address] [place], [place] [address]    Report Status 04/06/2021 FINAL    Organism ID, Bacteria STAPHYLOCOCCUS AUREUS       Susceptibility   Staphylococcus aureus - MIC*    CIPROFLOXACIN <=0.5 SENSITIVE Sensitive     ERYTHROMYCIN <=0.25 SENSITIVE Sensitive     GENTAMICIN <=0.5 SENSITIVE Sensitive     OXACILLIN 0.5 SENSITIVE Sensitive     TETRACYCLINE <=1 SENSITIVE Sensitive     VANCOMYCIN <=0.5 SENSITIVE Sensitive     TRIMETH/SULFA <=10 SENSITIVE Sensitive     CLINDAMYCIN <=0.25 SENSITIVE Sensitive     RIFAMPIN <=0.5 SENSITIVE Sensitive     Inducible Clindamycin NEGATIVE Sensitive     * ABUNDANT STAPHYLOCOCCUS AUREUS   Objective  Body mass index is 27.35 kg/m. Wt Readings from Last 3 Encounters:  06/03/21 174 lb 9.6 oz (79.2 kg)  04/01/21 174 lb (78.9 kg)  03/29/21 178 lb 9.2 oz (81 kg)   Temp Readings from Last 3 Encounters:  06/03/21 98.5 F (36.9 C) (Oral)  06/02/21 99.9 F (37.7 C) (Oral)  04/01/21 99 F  (37.2 C) (Oral)   BP Readings from Last 3 Encounters:  06/03/21 130/84  06/02/21 (!) 154/84  04/01/21 138/78   Pulse Readings from Last 3 Encounters:  06/03/21 72  06/02/21 70  04/01/21 72    Physical Exam Vitals and nursing note reviewed.  Constitutional:      Appearance: Normal appearance. She is well-developed and well-groomed. She is obese.  HENT:     Head: Normocephalic and atraumatic.  Eyes:     Conjunctiva/sclera: Conjunctivae normal.     Pupils: Pupils are equal, round, and reactive to light.  Cardiovascular:     Rate and Rhythm: Normal rate and regular rhythm.     Heart sounds: Normal heart sounds. No murmur heard. Pulmonary:     Effort: Pulmonary effort is normal.     Breath sounds: Normal breath sounds.  Abdominal:     Tenderness: There is abdominal tenderness in the periumbilical area.    Skin:    General: Skin is warm and dry.  Neurological:     General: No focal deficit present.     Mental Status: She is alert and oriented to person, place, and time. Mental status is at baseline.     Gait: Gait normal.  Psychiatric:        Attention and Perception: Attention and perception normal.        Mood and Affect: Mood and affect normal.        Speech: Speech normal.        Behavior: Behavior normal. Behavior is cooperative.        Thought Content: Thought content normal.        Cognition and Memory: Cognition and memory  normal.        Judgment: Judgment normal.   Assessment  Plan  Abdominal pain, generalized in incision s/p surgery 03/21/21 (hernia vs wound infection/abscess internally?)_- Plan: CT Abdomen Pelvis W Contrast, CBC w/Diff, Comprehensive metabolic panel, Creatinine,, Ua Pt to contact surgeon  [facility] plastics Dr. Dellia Nims asap  On keflex and doxy will start today  Rec f/u [place] plastics Dr. Dellia Nims asap called his office 06/04/21 detailed and inform what was going on with patient to reach out to her for appt also advised pt to reach out to this office   Insurance denied CT ab/pelvis Spoke with urology Dr. Junious Silk who rec KUB and renal US to r/u hydro with h/o kidney stones   Right arm cellulitis See above  S/P abdominal surgery, follow-up exam - Plan: CT Abdomen Pelvis W Contrast  MRSA carrier - Plan: mupirocin ointment (BACTROBAN) 2 % to nose b/l  Kidney stones b/l 9 mm left F/u Dr. Junious Silk   Provider: Dr. Olivia Mackie McLean-Scocuzza-Internal Medicine

## 2021-06-03 NOTE — Patient Instructions (Signed)
Dove antibacterial body wash  Chlorohexadine   Cellulitis, Adult  Cellulitis is a skin infection. The infected area is usually warm, red, swollen, and tender. This condition occurs most often in the arms and lower legs. The infection can travel to the muscles, blood, and underlying tissue andbecome serious. It is very important to get treated for this condition. What are the causes? Cellulitis is caused by bacteria. The bacteria enter through a break in theskin, such as a cut, burn, insect bite, open sore, or crack. What increases the risk? This condition is more likely to occur in people who: Have a weak body defense system (immune system). Have open wounds on the skin, such as cuts, burns, bites, and scrapes. Bacteria can enter the body through these open wounds. Are older than 55 years of age. Have diabetes. Have a type of long-lasting (chronic) liver disease (cirrhosis) or kidney disease. Are obese. Have a skin condition such as: Itchy rash (eczema). Slow movement of blood in the veins (venous stasis). Fluid buildup below the skin (edema). Have had radiation therapy. Use IV drugs. What are the signs or symptoms? Symptoms of this condition include: Redness, streaking, or spotting on the skin. Swollen area of the skin. Tenderness or pain when an area of the skin is touched. Warm skin. A fever. Chills. Blisters. How is this diagnosed? This condition is diagnosed based on a medical history and physical exam. You may also have tests, including: Blood tests. Imaging tests. How is this treated? Treatment for this condition may include: Medicines, such as antibiotic medicines or medicines to treat allergies (antihistamines). Supportive care, such as rest and application of cold or warm cloths (compresses) to the skin. Hospital care, if the condition is severe. The infection usually starts to get better within 1-2 days of treatment. Follow these instructions at  home:  Medicines Take over-the-counter and prescription medicines only as told by your health care provider. If you were prescribed an antibiotic medicine, take it as told by your health care provider. Do not stop taking the antibiotic even if you start to feel better. General instructions Drink enough fluid to keep your urine pale yellow. Do not touch or rub the infected area. Raise (elevate) the infected area above the level of your heart while you are sitting or lying down. Apply warm or cold compresses to the affected area as told by your health care provider. Keep all follow-up visits as told by your health care provider. This is important. These visits let your health care provider make sure a more serious infection is not developing. Contact a health care provider if: You have a fever. Your symptoms do not begin to improve within 1-2 days of starting treatment. Your bone or joint underneath the infected area becomes painful after the skin has healed. Your infection returns in the same area or another area. You notice a swollen bump in the infected area. You develop new symptoms. You have a general ill feeling (malaise) with muscle aches and pains. Get help right away if: Your symptoms get worse. You feel very sleepy. You develop vomiting or diarrhea that persists. You notice red streaks coming from the infected area. Your red area gets larger or turns dark in color. These symptoms may represent a serious problem that is an emergency. Do not wait to see if the symptoms will go away. Get medical help right away. Call your local emergency services (911 in the [place] Do not drive yourself to the hospital. Summary Cellulitis is a  skin infection. This condition occurs most often in the arms and lower legs. Treatment for this condition may include medicines, such as antibiotic medicines or antihistamines. Take over-the-counter and prescription medicines only as told by your health care  provider. If you were prescribed an antibiotic medicine, do not stop taking the antibiotic even if you start to feel better. Contact a health care provider if your symptoms do not begin to improve within 1-2 days of starting treatment or your symptoms get worse. Keep all follow-up visits as told by your health care provider. This is important. These visits let your health care provider make sure that a more serious infection is not developing. This information is not intended to replace advice given to you by your health care provider. Make sure you discuss any questions you have with your healthcare provider. Document Revised: 11/06/2019 Document Reviewed: 03/17/2018 Elsevier Patient Education  [place].

## 2021-06-04 LAB — CBC WITH DIFFERENTIAL/PLATELET
Basophils Absolute: 0 10*3/uL (ref 0.0–0.2)
Basos: 0 %
EOS (ABSOLUTE): 0.1 10*3/uL (ref 0.0–0.4)
Eos: 2 %
Hematocrit: 33.1 % — ABNORMAL LOW (ref 34.0–46.6)
Hemoglobin: 9.7 g/dL — ABNORMAL LOW (ref 11.1–15.9)
Immature Grans (Abs): 0 10*3/uL (ref 0.0–0.1)
Immature Granulocytes: 0 %
Lymphocytes Absolute: 2.5 10*3/uL (ref 0.7–3.1)
Lymphs: 36 %
MCH: 24 pg — ABNORMAL LOW (ref 26.6–33.0)
MCHC: 29.3 g/dL — ABNORMAL LOW (ref 31.5–35.7)
MCV: 82 fL (ref 79–97)
Monocytes Absolute: 0.6 10*3/uL (ref 0.1–0.9)
Monocytes: 8 %
Neutrophils Absolute: 3.7 10*3/uL (ref 1.4–7.0)
Neutrophils: 54 %
Platelets: 331 10*3/uL (ref 150–450)
RBC: 4.05 x10E6/uL (ref 3.77–5.28)
RDW: 15.5 % — ABNORMAL HIGH (ref 11.7–15.4)
WBC: 6.9 10*3/uL (ref 3.4–10.8)

## 2021-06-04 LAB — COMPREHENSIVE METABOLIC PANEL
ALT: 16 IU/L (ref 0–32)
AST: 21 IU/L (ref 0–40)
Albumin/Globulin Ratio: 1.3 (ref 1.2–2.2)
Albumin: 4 g/dL (ref 3.8–4.9)
Alkaline Phosphatase: 166 IU/L — ABNORMAL HIGH (ref 44–121)
BUN/Creatinine Ratio: 14 (ref 9–23)
BUN: 10 mg/dL (ref 6–24)
Bilirubin Total: <0.2 mg/dL (ref 0.0–1.2)
CO2: 24 mmol/L (ref 20–29)
Calcium: 8.7 mg/dL (ref 8.7–10.2)
Chloride: 106 mmol/L (ref 96–106)
Creatinine, Ser: 0.71 mg/dL (ref 0.57–1.00)
Globulin, Total: 3.2 g/dL (ref 1.5–4.5)
Glucose: 72 mg/dL (ref 65–99)
Potassium: 4.1 mmol/L (ref 3.5–5.2)
Sodium: 141 mmol/L (ref 134–144)
Total Protein: 7.2 g/dL (ref 6.0–8.5)
eGFR: 101 mL/min/{1.73_m2} (ref >59)

## 2021-06-04 LAB — URINALYSIS, ROUTINE W REFLEX MICROSCOPIC
Bilirubin, UA: NEGATIVE
Glucose, UA: NEGATIVE
Ketones, UA: NEGATIVE
Nitrite, UA: NEGATIVE
Protein,UA: NEGATIVE
RBC, UA: NEGATIVE
Specific Gravity, UA: 1.013 (ref 1.005–1.030)
Urobilinogen, Ur: 0.2 mg/dL (ref 0.2–1.0)
pH, UA: 6 (ref 5.0–7.5)

## 2021-06-04 LAB — MICROSCOPIC EXAMINATION
Bacteria, UA: NONE SEEN
Casts: NONE SEEN /lpf
RBC, Urine: NONE SEEN /hpf (ref 0–2)

## 2021-06-08 ENCOUNTER — Telehealth: Payer: Self-pay | Admitting: Internal Medicine

## 2021-06-08 NOTE — Telephone Encounter (Signed)
Inform pt below Please schedule [place] ab complete and ab xray if both negative maybe insurance will cover CT ab/pelvis though pt encouraged to f/u surgery and kidney doctors Dr. Dellia Nims and [facility]   Visit [id number] umbilical ab pain and h/o kidney stones s/p abdominoplasty/tummy tuck 03/21/21 she had CT abdomen pelvis 04/01/21   Pt to contact surgeon  [facility] plastics Dr. Dellia Nims asap informed 06/03/21   On keflex and doxy will start today 06/03/21 given from [facility] 06/02/21 due to right upper arm cellulitis after skin removal surgery for excess skin 03/21/21 b/l arms and abdominoplasty with linear scar all 3 areas   Rec f/u [place] plastics Dr. Dellia Nims asap called his office 06/04/21 left detailed message and informed what was going on with patient to reach out to her for appt also advised pt to reach out to this office  Insurance denied CT ab/pelvis had one 04/01/21 with contrast   Spoke with urology Dr. Junious Silk who rec KUB and renal US to r/u hydro with h/o kidney stones     Dr. Olivia Mackie McLean-Scocuzza

## 2021-06-08 NOTE — Addendum Note (Signed)
Addended by: Orland Mustard on: 06/08/2021 07:32 PM   Modules accepted: Orders

## 2021-06-09 NOTE — Telephone Encounter (Signed)
Left message to return call 

## 2021-06-11 ENCOUNTER — Other Ambulatory Visit: Payer: Self-pay

## 2021-06-11 ENCOUNTER — Ambulatory Visit
Admission: RE | Admit: 2021-06-11 | Discharge: 2021-06-11 | Disposition: A | Discharge status: Home / Self Care | Payer: 59 | Source: Ambulatory Visit | Attending: Internal Medicine | Admitting: Internal Medicine

## 2021-06-11 DIAGNOSIS — Z09 Encounter for follow-up examination after completed treatment for conditions other than malignant neoplasm: Secondary | ICD-10-CM | POA: Insufficient documentation

## 2021-06-11 DIAGNOSIS — N200 Calculus of kidney: Secondary | ICD-10-CM | POA: Diagnosis present

## 2021-06-11 DIAGNOSIS — R1084 Generalized abdominal pain: Secondary | ICD-10-CM | POA: Insufficient documentation

## 2021-06-11 NOTE — Telephone Encounter (Signed)
US abdomen completed today

## 2021-06-11 NOTE — Telephone Encounter (Signed)
Noted  

## 2021-06-18 NOTE — Progress Notes (Signed)
Subjective:    Patient ID: [name], female    DOB: [birth year], 55 y.o.   MRN: [id number]  CC: [name] is a 55 y.o. female who presents today for physical exam and new complaints.    HPI: She complains of dysuria, urinary frequency x 2 days  No fever, hematuria, flank pain.   Recently sexually active after 15 years. Describes pain with penetration. She hasnt tried lubricates. No abnormal vaginal discharge.  She has no concerns for STD and declines testing.  H/o renal stone.  No pain to remind of her renal stone.    Alkaline phos elevated Anemia- compliant with ferrous sulfate in her bariatric surgery vitamin; she is unsure of dose.    Dr. Jenny Reichmann plastic surgery; she has surgery 07/10/21 for skin removal and breast implants.   She was seen by my colleague Dr Aundra Dubin 06/03/2021 for abdominal pain  Follows with primary biliary cholangitis , Dr Dorris Fetch, last seen 05/2020. Suspected ursodiol 2500mg /day  elevated alk phos.    Colorectal Cancer Screening: UTD , Dr Vicente Males, 09/11/2019.  No polyps. Repeat in 5 years   breast Cancer Screening: Mammogram UTD Cervical Cancer Screening: due  Bone Health screening/DEXA for 65+:known osteoporosis , she declines treatment at this time.  She discussed this with endocrine, Dr. Elisabeth Cara who advised Prolia 06/25/21  Lung Cancer Screening: Doesn't have 20 year pack year history and age > 69 years yo 34 years          Tetanus - UTD         Labs: Screening labs today. Exercise: Gets regular exercise.   Alcohol use:  none Smoking/tobacco use: Nonsmoker.     HISTORY:  Past Medical History:  Diagnosis Date   Asthma due to seasonal allergies    no inhaler   History of diabetes mellitus, type II    per pt resolved after bariatric surgery 2017   History of hypertension    per pt resolved after bariatric surgery 2017   History of kidney stones    History of obstructive sleep apnea    per pt hx osa w/ cpap,  retested  after bariatric surgery 2017 result negative   History of skin cancer    nose, not melanoma   Hydronephrosis, right    Hyperlipidemia    MRSA carrier    NAFL (nonalcoholic fatty liver)    Osteoporosis    Renal calculi    per CT 04-18-2019 bilateral , non-obstructive   S/P bariatric surgery 08/03/2016   duodenal switch w/ hh repair   Ureteral calculus, right    Wears glasses     Past Surgical History:  Procedure Laterality Date   BARIATRIC SURGERY  08-03-2016    dr Darnell Level @[facility] ,  [place]   LAPAROSCOPY DUODENAL SWITCH AND HIATAL HERNIA REPAIR   BREAST BIOPSY Right 2017   FIBROCYSTIC CHANGES WITH CALCIFICATIONS   CESAREAN SECTION  x2  last one 09-10-2000   COLONOSCOPY WITH PROPOFOL N/A 09/11/2019   Procedure: COLONOSCOPY WITH PROPOFOL;  Surgeon: Jonathon Bellows, MD;  Location: [facility];  Service: Gastroenterology;  Laterality: N/A;   CYSTOSCOPY WITH RETROGRADE PYELOGRAM, URETEROSCOPY AND STENT PLACEMENT Right 02-07-2012  @[facility]    CYSTOSCOPY WITH STENT PLACEMENT Right 09/10/2017   Procedure: CYSTOSCOPY WITH STENT PLACEMENT;  Surgeon: Abbie Sons, MD;  Location: [facility] ORS;  Service: Urology;  Laterality: Right;   CYSTOSCOPY/URETEROSCOPY/HOLMIUM LASER/STENT PLACEMENT Right 04/24/2019   Procedure: CYSTOSCOPY/URETEROSCOPY/STENT PLACEMENT/ RIGHT RETROGRADE;  Surgeon: Festus Aloe, MD;  Location: [facility];  Service: Urology;  Laterality: Right;   DX LAPAROSCOPY/  EGD/  HIATAL HERNIA REPAIR CLOSURE OF INTERNAL MENSENTRY DEFECT  10-28-2017   '@[facility]'$ ,  [place]   EXTRACORPOREAL SHOCK WAVE LITHOTRIPSY N/A 01/30/2019   Procedure: EXTRACORPOREAL SHOCK WAVE LITHOTRIPSY (ESWL) LEFT/ POSSIBLE RIGHT;  Surgeon: Festus Aloe, MD;  Location: [facility];  Service: Urology;  Laterality: N/A;   EXTRACORPOREAL SHOCK WAVE LITHOTRIPSY Right 03/20/2019   Procedure: EXTRACORPOREAL SHOCK WAVE LITHOTRIPSY (ESWL);  Surgeon: Franchot Gallo, MD;  Location: [facility];  Service: Urology;  Laterality:  Right;   LAPAROSCOPIC CHOLECYSTECTOMY  10-15-2016   '@[facility]'$  ,  [place]   TONSILLECTOMY AND ADENOIDECTOMY  child   URETEROSCOPY WITH HOLMIUM LASER LITHOTRIPSY Right 09/10/2017   Procedure: URETEROSCOPY WITH HOLMIUM LASER LITHOTRIPSY;  Surgeon: Abbie Sons, MD;  Location: [facility];  Service: Urology;  Laterality: Right;   Family History  Problem Relation Age of Onset   Alcohol abuse Mother    Hyperlipidemia Mother    Hypertension Mother    Lymphoma Mother        primary site : brain   Hyperlipidemia Father    Hypertension Father    Parkinson's disease Father    Drug abuse Sister    Cancer Maternal Grandmother        breast cancer   Breast cancer Maternal Grandmother 72   Liver cancer Paternal Grandfather    Kidney cancer Neg Hx    Prostate cancer Neg Hx       ALLERGIES: Latex, Tolmetin, Gallium nitrate, Insulin detemir, Nsaids, and Gadolinium derivatives  Current Outpatient Medications on File Prior to Visit  Medication Sig Dispense Refill   CALCIUM PO Take by mouth.     Cholecalciferol (VITAMIN D3 PO) Take by mouth daily.     Multiple Vitamins-Minerals (BARIATRIC MULTIVITAMINS/IRON PO) Take by mouth.     mupirocin ointment (BACTROBAN) 2 % Apply 1 application topically 2 (two) times daily. X 5 days nose 30 g 0   PREVIDENT 5000 BOOSTER PLUS 1.1 % PSTE Place onto teeth as directed.     sertraline (ZOLOFT) 50 MG tablet Take 2 tablets by mouth daily.     ursodiol (ACTIGALL) 500 MG tablet Take 250 mg by mouth in the morning and at bedtime. Take two tablets by mouth in the morning & three tablets by mouth in the evening.     VITAMIN A PO Take by mouth daily.     No current facility-administered medications on file prior to visit.    Social History   Tobacco Use   Smoking status: Never   Smokeless tobacco: Never  Vaping Use   Vaping Use: Never used  Substance Use Topics   Alcohol use: No    Alcohol/week: 0.0 standard drinks   Drug use: Never    Review of Systems   Constitutional:  Negative for chills, fever and unexpected weight change.  HENT:  Negative for congestion.   Respiratory:  Negative for cough.   Cardiovascular:  Negative for chest pain, palpitations and leg swelling.  Gastrointestinal:  Negative for nausea and vomiting.  Genitourinary:  Positive for dysuria, frequency, pelvic pain and vaginal pain. Negative for flank pain, genital sores, menstrual problem, vaginal bleeding and vaginal discharge.  Musculoskeletal:  Negative for arthralgias and myalgias.  Skin:  Negative for rash.  Neurological:  Negative for headaches.  Hematological:  Negative for adenopathy.  Psychiatric/Behavioral:  Negative for confusion.      Objective:    BP 140/86   Pulse 67  Temp 98 F (36.7 C)   Ht 5' 7.01" (1.702 m)   Wt 180 lb (81.6 kg)   LMP 07/11/2016 (Exact Date)   SpO2 97%   BMI 28.19 kg/m   BP Readings from Last 3 Encounters:  06/30/21 140/86  06/25/21 120/78  06/03/21 130/84   Wt Readings from Last 3 Encounters:  06/30/21 180 lb (81.6 kg)  06/25/21 173 lb 6.4 oz (78.7 kg)  06/03/21 174 lb 9.6 oz (79.2 kg)    Physical Exam Vitals reviewed.  Constitutional:      Appearance: Normal appearance. She is well-developed.  Eyes:     Conjunctiva/sclera: Conjunctivae normal.  Neck:     Thyroid: No thyroid mass or thyromegaly.  Cardiovascular:     Rate and Rhythm: Normal rate and regular rhythm.     Pulses: Normal pulses.     Heart sounds: Normal heart sounds.  Pulmonary:     Effort: Pulmonary effort is normal.     Breath sounds: Normal breath sounds. No wheezing, rhonchi or rales.  Chest:  Breasts:    Breasts are symmetrical.     Right: No inverted nipple, mass, nipple discharge, skin change or tenderness.     Left: No inverted nipple, mass, nipple discharge, skin change or tenderness.  Abdominal:     General: Bowel sounds are normal. There is no distension.     Palpations: Abdomen is soft. Abdomen is not rigid. There is no fluid  wave or mass.     Tenderness: There is no abdominal tenderness. There is no guarding or rebound.       Comments: Surgical scar is noted on diagram  Genitourinary:    Cervix: No cervical motion tenderness, discharge or friability.     Uterus: Not enlarged, not fixed and not tender.      Adnexa:        Right: No mass, tenderness or fullness.         Left: No mass, tenderness or fullness.       Comments: Vulvovaginal pallor and decreased vaginal secretions noted on exam . pap performed. No CMT. Unable to appreciated ovaries. Lymphadenopathy:     Head:     Right side of head: No submental, submandibular, tonsillar, preauricular, posterior auricular or occipital adenopathy.     Left side of head: No submental, submandibular, tonsillar, preauricular, posterior auricular or occipital adenopathy.     Cervical: No cervical adenopathy.     Right cervical: No superficial, deep or posterior cervical adenopathy.    Left cervical: No superficial, deep or posterior cervical adenopathy.     Upper Body:     Right upper body: No pectoral adenopathy.     Left upper body: No pectoral adenopathy.  Skin:    General: Skin is warm and dry.  Neurological:     Mental Status: She is alert.  Psychiatric:        Speech: Speech normal.        Behavior: Behavior normal.        Thought Content: Thought content normal.       Assessment & Plan:   Problem List Items Addressed This Visit       Digestive   Primary biliary cirrhosis ([place])    History of elevated alkaline phosphatase.  Reviewed Dr. Caroll Rancher note and he is aware of this.  She is compliant with ursodiol. pending repeat alk phos to trend.  She will continue to follow with Dr. Dorris Fetch, will follow.       Relevant  Orders   Alkaline phosphatase, isoenzymes     Musculoskeletal and Integument   Osteoporosis    Osteoporosis.  Question whether elevated alk phosphatase is related to bone versus liver disease.  Pending isoenzymes to fractionate.  She is  currently following with endocrine for this and has declined medication therapy at this time.  She will like to further consider her options.  Will follow      Relevant Medications   VITAMIN A PO   Cholecalciferol (VITAMIN D3 PO)   Other Relevant Orders   Alkaline phosphatase, isoenzymes     Other   Anemia    Worsening of anemia, likely escalated after plastic surgery.  Pending CBC, iron stores.  She has upcoming surgery on the next month.  I provided her with stool cards and reiterated the importance of returning these.       Dyspareunia, female    Presentation most consistent with all vulvovaginal atrophy, postmenopausal.  Discussed with patient lifestyle modifications including foreplay, water-based lubrication.  She is a family history of breast cancer and we jointly agreed not to trial vaginal estrogen.  For thoroughness, pelvic US ordered to ensure no ovarian pathology.      Relevant Orders   US Pelvic Complete With Transvaginal   POCT Urinalysis Dipstick (Completed)   CA 125   Dysuria    Afebrile and not septic in appearance.  No flank pain.  Hematuria.  Pending urine culture.  Advised patient to stay vigilant and if she  develops flank pain or migratory pain she will let me know ASAP so we can order imaging to evaluate for renal calculus      Routine general medical examination at a health care facility - Primary    Clinical breast exam and Pap smear performed.       Relevant Orders   Fecal occult blood, imunochemical   US Pelvic Complete With Transvaginal   Pap LB (liquid-based)   Alkaline phosphatase, isoenzymes   B12 and Folate Panel   CA 125   CBC with Differential/Platelet   Comprehensive metabolic panel   Iron, TIBC and Ferritin Panel   Lipid panel   Other Visit Diagnoses     Need for vaccination       Relevant Orders   Varicella-zoster vaccine IM (Shingrix) (Completed)        I am having Ellarae D. Eichenberger maintain her ursodiol, PreviDent 5000 Booster  Plus, Multiple Vitamins-Minerals (BARIATRIC MULTIVITAMINS/IRON PO), sertraline, CALCIUM PO, mupirocin ointment, VITAMIN A PO, and Cholecalciferol (VITAMIN D3 PO).   No orders of the defined types were placed in this encounter.   Return precautions given.   Risks, benefits, and alternatives of the medications and treatment plan prescribed today were discussed, and patient expressed understanding.   Education regarding symptom management and diagnosis given to patient on AVS.   Continue to follow with [name], FNP for routine health maintenance.   [name] and I agreed with plan.   [name], FNP

## 2021-06-25 ENCOUNTER — Ambulatory Visit: Payer: 59 | Admitting: Internal Medicine

## 2021-06-25 ENCOUNTER — Encounter: Payer: Self-pay | Admitting: Internal Medicine

## 2021-06-25 ENCOUNTER — Other Ambulatory Visit: Payer: Self-pay

## 2021-06-25 VITALS — BP 120/78 | HR 54 | Ht 67.0 in | Wt 173.4 lb

## 2021-06-25 DIAGNOSIS — M810 Age-related osteoporosis without current pathological fracture: Secondary | ICD-10-CM

## 2021-06-25 DIAGNOSIS — E042 Nontoxic multinodular goiter: Secondary | ICD-10-CM

## 2021-06-25 NOTE — Progress Notes (Signed)
Name: [name]  MRN/ DOB: [id number], Feb 03, 1966    Age/ Sex: 55 y.o., female     PCP: [name], FNP   Reason for Endocrinology Evaluation: Thyroid nodule /osteoporosis     Initial Endocrinology Clinic Visit: 06/26/2019    PATIENT IDENTIFIER: [name] is a 55 y.o., female with a past medical history of HTN,T2DM and Hx of gastric bypass and primary biliary cholangitis . She has followed with [facility] since 06/26/2019 for consultative assistance with management of her thyroid nodules .   HISTORICAL SUMMARY:  Was diagnosed with MNG in 2006. Per pt she had drainage of a thyroid cyst with benign cytology. In 2020 she presented to her PCP with c/o worsening neck enlargement, associated with dysphagia,and discomfort. An ultrasound showed a left thyroid nodule that met criteria for FNA. The nodule was smaller then it has been years ago and the pt opted not to proceed with FNA .     Osteoporosis History: She is S/P Gastric Bypass sx in  07/2016 , Pre-surgery weight was 333 Lbs, lowest weight since sx 150 lbs. She was on 2 bariatric vitamins up to a year ago and was stopped due to recurrent renal stones, stopping the vitamins did not help and had ~ 17 renal stones in the past year. Pt had a DXA 04/14/2019 with a T-Score of -3.0 which prompted her to start taking her vitamins .  She agreed to start Prolia in 12/2020 but did not receive any due to inability of the staff to reach the pt    SUBJECTIVE:   During last visit (12/26/2020): She was advised to continue with Vitamin D, calcium and MVI     Today (06/25/2021):  [name] is here for a follow up on Osteoporosis  and secondary hyperparathyroidism  As well as MNG  She continues to follow up at [facility] for primary biliary cholangitis. On ursodiol   Denies any local neck enlargement, she denies any dysphagia Denies perioral  tingling and numbness Denies hand spasms  Denies constipation   Weight stable , recently removed 10 lbs of skin    She is apprehensive about the side effects of Prolia    Bariatric advantage 2 tabs daily  ( each tab contains 85 mg of calcium and 1500 iu of vitamin D3)  4 of the Raw calcium ( 1600 iu vitamin D and 1100 mg of calcium)  Vitamin D 2000 iu  Vitamin A 10,000    HISTORY:  Past Medical History:  Past Medical History:  Diagnosis Date   Asthma due to seasonal allergies    no inhaler   History of diabetes mellitus, type II    per pt resolved after bariatric surgery 2017   History of hypertension    per pt resolved after bariatric surgery 2017   History of kidney stones    History of obstructive sleep apnea    per pt hx osa w/ cpap,  retested after bariatric surgery 2017 result negative   History of skin cancer    nose, not melanoma   Hydronephrosis, right    Hyperlipidemia    MRSA carrier    NAFL (nonalcoholic fatty liver)    Osteoporosis    Renal calculi    per CT 04-18-2019 bilateral , non-obstructive   S/P bariatric surgery 08/03/2016   duodenal switch w/ hh repair   Ureteral calculus, right    Wears glasses    Past Surgical History:  Past Surgical History:  Procedure Laterality Date   BARIATRIC SURGERY  08-03-2016    dr Darnell Level _0 ,  [facility]   LAPAROSCOPY DUODENAL SWITCH AND HIATAL HERNIA REPAIR   BREAST BIOPSY Right 2017   FIBROCYSTIC CHANGES WITH CALCIFICATIONS   CESAREAN SECTION  x2  last one 09-10-2000   COLONOSCOPY WITH PROPOFOL N/A 09/11/2019   Procedure: COLONOSCOPY WITH PROPOFOL;  Surgeon: Jonathon Bellows, MD;  Location: [facility];  Service: Gastroenterology;  Laterality: N/A;   CYSTOSCOPY WITH RETROGRADE PYELOGRAM, URETEROSCOPY AND STENT PLACEMENT Right 02-07-2012  _1    CYSTOSCOPY WITH STENT PLACEMENT Right 09/10/2017   Procedure: CYSTOSCOPY WITH STENT PLACEMENT;  Surgeon: Abbie Sons, MD;  Location: [facility];  Service: Urology;  Laterality: Right;   CYSTOSCOPY/URETEROSCOPY/HOLMIUM LASER/STENT  PLACEMENT Right 04/24/2019   Procedure: CYSTOSCOPY/URETEROSCOPY/STENT PLACEMENT/ RIGHT RETROGRADE;  Surgeon: Festus Aloe, MD;  Location: [facility];  Service: Urology;  Laterality: Right;   DX LAPAROSCOPY/  EGD/  HIATAL HERNIA REPAIR CLOSURE OF INTERNAL MENSENTRY DEFECT  10-28-2017   _2 ,  [place]   EXTRACORPOREAL SHOCK WAVE LITHOTRIPSY N/A 01/30/2019   Procedure: EXTRACORPOREAL SHOCK WAVE LITHOTRIPSY (ESWL) LEFT/ POSSIBLE RIGHT;  Surgeon: Festus Aloe, MD;  Location: [facility];  Service: Urology;  Laterality: N/A;   EXTRACORPOREAL SHOCK WAVE LITHOTRIPSY Right 03/20/2019   Procedure: EXTRACORPOREAL SHOCK WAVE LITHOTRIPSY (ESWL);  Surgeon: Franchot Gallo, MD;  Location: [facility];  Service: Urology;  Laterality: Right;   LAPAROSCOPIC CHOLECYSTECTOMY  10-15-2016   _3  ,  [place]   TONSILLECTOMY AND ADENOIDECTOMY  child   URETEROSCOPY WITH HOLMIUM LASER LITHOTRIPSY Right 09/10/2017   Procedure: URETEROSCOPY WITH HOLMIUM LASER LITHOTRIPSY;  Surgeon: Abbie Sons, MD;  Location: [facility];  Service: Urology;  Laterality: Right;   Social History:  reports that she has never smoked. She has never used smokeless tobacco. She reports that she does not drink alcohol and does not use drugs. Family History:  Family History  Problem Relation Age of Onset   Alcohol abuse Mother    Hyperlipidemia Mother    Hypertension Mother    Lymphoma Mother        primary site : brain   Hyperlipidemia Father    Hypertension Father    Parkinson's disease Father    Drug abuse Sister    Cancer Maternal Grandmother        breast cancer   Breast cancer Maternal Grandmother    Liver cancer Paternal Grandfather    Kidney cancer Neg Hx    Prostate cancer Neg Hx      HOME MEDICATIONS: Allergies as of 06/25/2021       Reactions   Latex Shortness Of Breath, Cough   Tolmetin Hives   Gallium Nitrate Hives   AND Gadolinium Derivatives   Insulin Detemir Hives, Swelling   Lump at injection  site   Nsaids Hives   Per pt stated has to take ibuprofen with a antihistimine   Gadolinium Derivatives Hives   Pt c/o itching after contrast injection 03/15/19 @ 8:45am. [facility] Per Dr. Kathlene Cote document this as allergic reaction.         Medication List        Accurate as of June 25, 2021  7:32 AM. If you have any questions, ask your nurse or doctor.          BARIATRIC MULTIVITAMINS/IRON PO Take by mouth.   CALCIUM PO Take by mouth.   HYDROcodone-acetaminophen 7.5-325 MG tablet Commonly known as: NORCO Take 1 tablet by mouth every 6 (six) hours as needed.  mupirocin ointment 2 % Commonly known as: BACTROBAN Apply 1 application topically 2 (two) times daily. X 5 days nose   pantoprazole 20 MG tablet Commonly known as: PROTONIX TAKE 1 TABLET BY MOUTH EVERY DAY   phentermine 37.5 MG tablet Commonly known as: ADIPEX-P Take 0.5 tablets by mouth daily.   PreviDent 5000 Booster Plus 1.1 % Pste Generic drug: Sodium Fluoride Place onto teeth as directed.   sertraline 50 MG tablet Commonly known as: ZOLOFT Take 2 tablets by mouth daily.   ursodiol 500 MG tablet Commonly known as: ACTIGALL Take 250 mg by mouth in the morning and at bedtime. Take two tablets by mouth in the morning & three tablets by mouth in the evening.   Vitamin D (Ergocalciferol) 1.25 MG (50000 UNIT) Caps capsule Commonly known as: DRISDOL TAKE 1 CAPSULE BY MOUTH 1 TIME A WEEK          OBJECTIVE:   PHYSICAL EXAM: VS: BP 120/78 (BP Location: Right Arm, Patient Position: Sitting, Cuff Size: Normal)   Pulse (!) 54   Ht 5' 7" (1.702 m)   Wt 173 lb 6.4 oz (78.7 kg)   LMP 07/11/2016 (Exact Date)   SpO2 99%   BMI 27.16 kg/m    EXAM: General: Pt appears well and is in NAD  Neck: General: Supple without adenopathy. Thyroid: Thyroid size normal. Left  nodule appreciated.   Lungs: Clear with good BS bilat with no rales, rhonchi, or wheezes  Heart: Auscultation: RRR.  Abdomen: Normoactive  bowel sounds, soft, nontender, without masses or organomegaly palpable  Extremities:  BL LE: No pretibial edema normal ROM and strength.  Neuro: Cranial nerves: II - XII grossly intact  Motor: Normal strength throughout  Mental Status: Judgment, insight: Intact Orientation: Oriented to time, place, and person Mood and affect: No depression, anxiety, or agitation     DATA REVIEWED: Results for [name], [name] (MRN [id number]) as of 06/26/2021 19:34  Ref. Range 06/03/2021 09:24  Sodium Latest Ref Range: 134 - 144 mmol/L 141  Potassium Latest Ref Range: 3.5 - 5.2 mmol/L 4.1  Chloride Latest Ref Range: 96 - 106 mmol/L 106  CO2 Latest Ref Range: 20 - 29 mmol/L 24  Glucose Latest Ref Range: 65 - 99 mg/dL 72  BUN Latest Ref Range: 6 - 24 mg/dL 10  Creatinine Latest Ref Range: 0.57 - 1.00 mg/dL 0.71  Calcium Latest Ref Range: 8.7 - 10.2 mg/dL 8.7  BUN/Creatinine Ratio Latest Ref Range: 9 - 23  14  eGFR Latest Ref Range: >59 mL/min/1.73 101  Alkaline Phosphatase Latest Ref Range: 44 - 121 IU/L 166 (H)  Albumin Latest Ref Range: 3.8 - 4.9 g/dL 4.0  Albumin/Globulin Ratio Latest Ref Range: 1.2 - 2.2  1.3  AST Latest Ref Range: 0 - 40 IU/L 21  ALT Latest Ref Range: 0 - 32 IU/L 16  Total Protein Latest Ref Range: 6.0 - 8.5 g/dL 7.2  Total Bilirubin Latest Ref Range: 0.0 - 1.2 mg/dL <0.2      Thyroid Ultrasound 07/25/2020 Nodule # 1:   Prior biopsy: No   Location: Right; Inferior   Maximum size: 1.1 cm; Other 2 dimensions: 0.7 x 0.9 cm, previously, 1.1 x 0.6 x 1.1 cm   Composition: solid/almost completely solid (2)   Echogenicity: hypoechoic (2)   Shape: not taller-than-wide (0)   Margins: ill-defined (0)   Echogenic foci: none (0)   ACR TI-RADS total points: 4.   ACR TI-RADS risk category:  TR4 (4-6 points).  Significant change in size (>/= 20% in two dimensions and minimal increase of 2 mm): No   Change in features: No   Change in ACR TI-RADS risk category:  No   ACR TI-RADS recommendations:   *Given size (>/= 1 - 1.4 cm) and appearance, a follow-up ultrasound in 1 year should be considered based on TI-RADS criteria.   _________________________________________________________   Nodule # 4:   Prior biopsy: No   Location: Left; Inferior   Maximum size: 2.7 cm; Other 2 dimensions: 1.9 x 2 cm, previously, 2.5 x 1.7 x 2.2 cm   Composition: solid/almost completely solid (2)   Echogenicity: isoechoic (1)   Shape: not taller-than-wide (0)   Margins: ill-defined (0)   Echogenic foci: macrocalcifications (1)   ACR TI-RADS total points: 4.   ACR TI-RADS risk category:  TR4 (4-6 points).   Significant change in size (>/= 20% in two dimensions and minimal increase of 2 mm): No   Change in features: No   Change in ACR TI-RADS risk category: No   ACR TI-RADS recommendations:   **Given size (>/= 1.5 cm) and appearance, fine needle aspiration of this moderately suspicious nodule should be considered based on TI-RADS criteria.   _________________________________________________________   IMPRESSION: 1. No significant interval change in the size or appearance of the approximately 2.7 cm TI-RADS category 4 nodule in the left inferior gland dating back to July of 2020. This lesion continues to meet criteria to consider fine-needle aspiration biopsy. 2. No significant interval change in the size or appearance of the 1.1 cm TI-RADS category 4 nodule in the right inferior gland. This nodule continues to meet criteria for annual imaging surveillance. Recommend follow-up ultrasound in 1 year.     FNA 09/18/2020 Clinical History: Left; Inferior 2.7 cm;  Other 2 dimensions: 1.9 x 2  cm; TI-RADS - 4  Specimen Submitted:  A. THYROID GLAND, LEFT, LLP, FINE NEEDLE  ASPIRATION:    FINAL MICROSCOPIC DIAGNOSIS:  - Consistent with benign follicular nodule ([place] category II)     DXA 11/06/2020  AP Spine L2-L4 11/06/2020 54.1  Osteoporosis -3.7 0.757 g/cm2 - -   DualFemur Total Right 11/06/2020 54.1 Osteopenia -1.3 0.849 g/cm2 -2.5% Yes DualFemur Total Right 04/14/2019 52.6 Osteopenia -1.1 0.871 g/cm2 - -   DualFemur Total Mean 11/06/2020 54.1 Osteopenia -1.3 0.849 g/cm2 -3.4% Yes DualFemur Total Mean 04/14/2019 52.6 Normal -1.0 0.879 g/cm2 - -  ASSESSMENT / PLAN / RECOMMENDATIONS:   Multinodular Goiter:    - Pt is clinically and biochemically euthyroid  - No local neck symptoms  - FNA of the left inferior nodule was benign 07/25/2020 - I have ordered a thyroid ultrasound for stability      2. Osteoporosis  :    - Declines Prolia or any other anti-resorptive therapy due to concerns about side effects  - She opted to adhere to vitamins and well as muscle strengthening exercise  - I explained to her that she is at very high risk for fracture and she expressed understanding of this    Medications  Bariatric advantage 2 tabs daily  ( each tab contains 85 mg of calcium and 1500 iu of vitamin D3)  4 of the Raw calcium ( 1600 iu vitamin D and 1100 mg of calcium) Prolia 60 mg [place] Q 6 months      F/U in 1 yr     Signed electronically by: Mack Guise, MD  [facility]  [address]  [place], [place] [address] Phone: [phone] FAX: [phone]      CC: [name], FNP [address] [place] [address] Phone: [phone]  Fax: [phone]   Return to Endocrinology clinic as below: Future Appointments  Date Time Provider [place]  06/25/2021  7:50 AM Shamleffer, Melanie Crazier, MD [facility] None  06/30/2021  8:30 AM Vidal Schwalbe Yvetta Coder, FNP [facility]

## 2021-06-26 ENCOUNTER — Encounter: Payer: Self-pay | Admitting: Internal Medicine

## 2021-06-30 ENCOUNTER — Ambulatory Visit (INDEPENDENT_AMBULATORY_CARE_PROVIDER_SITE_OTHER): Payer: 59 | Admitting: Family

## 2021-06-30 ENCOUNTER — Other Ambulatory Visit: Payer: Self-pay

## 2021-06-30 ENCOUNTER — Telehealth: Payer: Self-pay | Admitting: Family

## 2021-06-30 ENCOUNTER — Encounter: Payer: Self-pay | Admitting: Family

## 2021-06-30 VITALS — BP 140/86 | HR 67 | Temp 98.0°F | Ht 67.01 in | Wt 180.0 lb

## 2021-06-30 DIAGNOSIS — D649 Anemia, unspecified: Secondary | ICD-10-CM | POA: Diagnosis not present

## 2021-06-30 DIAGNOSIS — M810 Age-related osteoporosis without current pathological fracture: Secondary | ICD-10-CM | POA: Diagnosis not present

## 2021-06-30 DIAGNOSIS — Z0000 Encounter for general adult medical examination without abnormal findings: Secondary | ICD-10-CM

## 2021-06-30 DIAGNOSIS — Z23 Encounter for immunization: Secondary | ICD-10-CM | POA: Diagnosis not present

## 2021-06-30 DIAGNOSIS — N941 Unspecified dyspareunia: Secondary | ICD-10-CM | POA: Diagnosis not present

## 2021-06-30 DIAGNOSIS — K743 Primary biliary cirrhosis: Secondary | ICD-10-CM

## 2021-06-30 DIAGNOSIS — R300 Dysuria: Secondary | ICD-10-CM

## 2021-06-30 LAB — POCT URINALYSIS DIPSTICK
Bilirubin, UA: NEGATIVE
Glucose, UA: NEGATIVE
Ketones, UA: NEGATIVE
Leukocytes, UA: NEGATIVE
Nitrite, UA: NEGATIVE
Protein, UA: NEGATIVE
Spec Grav, UA: >=1.03 — AB (ref 1.010–1.025)
Urobilinogen, UA: 0.2 E.U./dL
pH, UA: 6 (ref 5.0–8.0)

## 2021-06-30 NOTE — Patient Instructions (Addendum)
It is imperative that you are seen AT least twice per year for labs and monitoring. Monitor blood pressure at home and me 5-6 reading on separate days. Goal is less than 120/80, based on newest guidelines, however we certainly want to be less than 130/80;  if persistently higher, please make sooner follow up appointment so we can recheck you blood pressure and manage/ adjust medications.  Please make a follow up with Dr Dorris Fetch as discussed  Trial of water based lubricant  Ordered pelvic ultrasound  Let us know if you dont hear back within a week in regards to an appointment being scheduled.   Health Maintenance for Postmenopausal Women Menopause is a normal process in which your ability to get pregnant comes to an end. This process happens slowly over many months or years, usually between the ages of 69 and 65. Menopause is complete when you have missed your menstrualperiods for 12 months. It is important to talk with your health care provider about some of the most common conditions that affect women after menopause (postmenopausal women). These include heart disease, cancer, and bone loss (osteoporosis). Adopting a healthy lifestyle and getting preventive care can help to promote your health and wellness. The actions you take can also lower your chances ofdeveloping some of these common conditions. What should I know about menopause? During menopause, you may get a number of symptoms, such as: Hot flashes. These can be moderate or severe. Night sweats. Decrease in sex drive. Mood swings. Headaches. Tiredness. Irritability. Memory problems. Insomnia. Choosing to treat or not to treat these symptoms is a decision that you makewith your health care provider. Do I need hormone replacement therapy? Hormone replacement therapy is effective in treating symptoms that are caused by menopause, such as hot flashes and night sweats. Hormone replacement carries certain risks, especially as you become  older. If you are thinking about using estrogen or estrogen with progestin, discuss the benefits and risks with your health care provider. What is my risk for heart disease and stroke? The risk of heart disease, heart attack, and stroke increases as you age. One of the causes may be a change in the body's hormones during menopause. This can affect how your body uses dietary fats, triglycerides, and cholesterol. Heart attack and stroke are medical emergencies. There are many things that you cando to help prevent heart disease and stroke. Watch your blood pressure High blood pressure causes heart disease and increases the risk of stroke. This is more likely to develop in people who have high blood pressure readings, are of African descent, or are overweight. Have your blood pressure checked: Every 3-5 years if you are 52-27 years of age. Every year if you are 81 years old or older. Eat a healthy diet  Eat a diet that includes plenty of vegetables, fruits, low-fat dairy products, and lean protein. Do not eat a lot of foods that are high in solid fats, added sugars, or sodium.  Get regular exercise Get regular exercise. This is one of the most important things you can do for your health. Most adults should: Try to exercise for at least 150 minutes each week. The exercise should increase your heart rate and make you sweat (moderate-intensity exercise). Try to do strengthening exercises at least twice each week. Do these in addition to the moderate-intensity exercise. Spend less time sitting. Even light physical activity can be beneficial. Other tips Work with your health care provider to achieve or maintain a healthy weight. Do not  use any products that contain nicotine or tobacco, such as cigarettes, e-cigarettes, and chewing tobacco. If you need help quitting, ask your health care provider. Know your numbers. Ask your health care provider to check your cholesterol and your blood sugar (glucose).  Continue to have your blood tested as directed by your health care provider. Do I need screening for cancer? Depending on your health history and family history, you may need to have cancer screening at different stages of your life. This may include screening for: Breast cancer. Cervical cancer. Lung cancer. Colorectal cancer. What is my risk for osteoporosis? After menopause, you may be at increased risk for osteoporosis. Osteoporosis is a condition in which bone destruction happens more quickly than new bone creation. To help prevent osteoporosis or the bone fractures that can happen because of osteoporosis, you may take the following actions: If you are 10-30 years old, get at least 1,000 mg of calcium and at least 600 mg of vitamin D per day. If you are older than age 82 but younger than age 70, get at least 1,200 mg of calcium and at least 600 mg of vitamin D per day. If you are older than age 27, get at least 1,200 mg of calcium and at least 800 mg of vitamin D per day. Smoking and drinking excessive alcohol increase the risk of osteoporosis. Eat foods that are rich in calcium and vitamin D, and do weight-bearing exercisesseveral times each week as directed by your health care provider. How does menopause affect my mental health? Depression may occur at any age, but it is more common as you become older. Common symptoms of depression include: Low or sad mood. Changes in sleep patterns. Changes in appetite or eating patterns. Feeling an overall lack of motivation or enjoyment of activities that you previously enjoyed. Frequent crying spells. Talk with your health care provider if you think that you are experiencingdepression. General instructions See your health care provider for regular wellness exams and vaccines. This may include: Scheduling regular health, dental, and eye exams. Getting and maintaining your vaccines. These include: Influenza vaccine. Get this vaccine each year  before the flu season begins. Pneumonia vaccine. Shingles vaccine. Tetanus, diphtheria, and pertussis (Tdap) booster vaccine. Your health care provider may also recommend other immunizations. Tell your health care provider if you have ever been abused or do not feel safeat home. Summary Menopause is a normal process in which your ability to get pregnant comes to an end. This condition causes hot flashes, night sweats, decreased interest in sex, mood swings, headaches, or lack of sleep. Treatment for this condition may include hormone replacement therapy. Take actions to keep yourself healthy, including exercising regularly, eating a healthy diet, watching your weight, and checking your blood pressure and blood sugar levels. Get screened for cancer and depression. Make sure that you are up to date with all your vaccines. This information is not intended to replace advice given to you by your health care provider. Make sure you discuss any questions you have with your healthcare provider. Document Revised: 10/19/2018 Document Reviewed: 10/19/2018 Elsevier Patient Education  2022 [place].    Atrophic Vaginitis  Atrophic vaginitis is a condition in which the tissues that line the vagina become dry and thin. This condition is most common in women who have stopped having regular menstrual periods (are in menopause). This usually starts when a woman is 84 to 55 years old. That is the timewhen a woman's estrogen levels begin to decrease. Estrogen  is a female hormone. It helps to keep the tissues of the vagina moist. It stimulates the vagina to produce a clear fluid that lubricates the vagina for sex. This fluid also protects the vagina from infection. Lack of estrogen can cause the lining of the vagina to get thinner and dryer. The vagina may also shrink in size. It may become less elastic. Atrophic vaginitis tends toget worse over time as a woman's estrogen level drops. What are the  causes? This condition is caused by the normal drop in estrogen that happens around thetime of menopause. What increases the risk? Certain conditions or situations may lower a woman's estrogen level, leading to a higher risk for atrophic vaginitis. You are more likely to develop this condition if: You are taking medicines that block estrogen. You have had your ovaries removed. You are being treated for cancer with radiation or medicines (chemotherapy). You have given birth or are breastfeeding. You are older than age 16. You smoke. What are the signs or symptoms? Symptoms of this condition include: Pain, soreness, a feeling of pressure, or bleeding during sex (dyspareunia). Vaginal burning, irritation, or itching. Pain or bleeding when a speculum is used in a vaginal exam. Having burning pain while urinating. Vaginal discharge. In some cases, there are no symptoms. How is this diagnosed? This condition is diagnosed based on your medical history and a physical exam. This will include a pelvic exam that checks the vaginal tissues. Though rare, you may also have other tests, including: A urine test. A test that checks the acid balance in your vagina (acid balance test). How is this treated? Treatment for this condition depends on how severe your symptoms are. Treatment may include: Using an over-the-counter vaginal lubricant before sex. Using a long-acting vaginal moisturizer. Using low-dose estrogen for moderate to severe symptoms that do not respond to other treatments. Options include creams, tablets, and inserts (vaginal rings). Before you use a vaginal estrogen, tell your health care provider if you have a history of: Breast cancer. Endometrial cancer. Blood clots. If you are not sexually active and your symptoms are very mild, you may notneed treatment. Follow these instructions at home: Medicines Take over-the-counter and prescription medicines only as told by your health care  provider. Do not use herbal or alternative medicines unless your health care provider says that you can. Use over-the-counter creams, lubricants, or moisturizers for dryness only as told by your health care provider. General instructions If your atrophic vaginitis is caused by menopause, discuss all of your menopause symptoms and treatment options with your health care provider. Do not douche. Do not use products that can make your vagina dry. These include: Scented feminine sprays. Scented tampons. Scented soaps. Vaginal sex can help to improve blood flow and elasticity of vaginal tissue. If you choose to have sex and it hurts, try using a water-soluble lubricant or moisturizer right before having sex. Contact a health care provider if: Your discharge looks different than normal. Your vagina has an unusual smell. You have new symptoms. Your symptoms do not improve with treatment. Your symptoms get worse. Summary Atrophic vaginitis is a condition in which the tissues that line the vagina become dry and thin. It is most common in women who have stopped having regular menstrual periods (are in menopause). Treatment options include using vaginal lubricants and low-dose vaginal estrogen. Contact a health care provider if your vagina has an unusual smell, or if your symptoms get worse or do not improve after treatment. This information  is not intended to replace advice given to you by your health care provider. Make sure you discuss any questions you have with your healthcare provider. Document Revised: 04/25/2020 Document Reviewed: 04/25/2020 Elsevier Patient Education  [place].

## 2021-06-30 NOTE — Telephone Encounter (Signed)
Lft pt vm to call ofc to sch US pelvic. thanks

## 2021-07-01 DIAGNOSIS — D649 Anemia, unspecified: Secondary | ICD-10-CM | POA: Insufficient documentation

## 2021-07-01 DIAGNOSIS — R300 Dysuria: Secondary | ICD-10-CM | POA: Insufficient documentation

## 2021-07-01 NOTE — Assessment & Plan Note (Signed)
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  Discussed with patient lifestyle modifications including foreplay, water-based lubrication.  She is a family history of breast cancer and we jointly agreed not to trial vaginal estrogen.  For thoroughness, pelvic US ordered to ensure no ovarian pathology.

## 2021-07-01 NOTE — Assessment & Plan Note (Addendum)
Osteoporosis.  Question whether elevated alk phosphatase is related to bone versus liver disease.  Pending isoenzymes to fractionate.  She is currently following with endocrine for this and has declined medication therapy at this time.  She will like to further consider her options.  Will follow

## 2021-07-01 NOTE — Assessment & Plan Note (Signed)
Clinical breast exam and Pap smear performed.

## 2021-07-01 NOTE — Assessment & Plan Note (Signed)
Afebrile and not septic in appearance.  No flank pain.  Hematuria.  Pending urine culture.  Advised patient to stay vigilant and if she  develops flank pain or migratory pain she will let me know ASAP so we can order imaging to evaluate for renal calculus

## 2021-07-01 NOTE — Assessment & Plan Note (Signed)
History of elevated alkaline phosphatase.  Reviewed Dr. Caroll Rancher note and he is aware of this.  She is compliant with ursodiol. pending repeat alk phos to trend.  She will continue to follow with Dr. Dorris Fetch, will follow.

## 2021-07-01 NOTE — Assessment & Plan Note (Signed)
Worsening of anemia, likely escalated after plastic surgery.  Pending CBC, iron stores.  She has upcoming surgery on the next month.  I provided her with stool cards and reiterated the importance of returning these.

## 2021-07-03 LAB — PAP LB (LIQUID-BASED)

## 2021-07-07 ENCOUNTER — Other Ambulatory Visit: Payer: Self-pay | Admitting: Family

## 2021-07-07 DIAGNOSIS — Z124 Encounter for screening for malignant neoplasm of cervix: Secondary | ICD-10-CM

## 2021-07-09 ENCOUNTER — Telehealth: Payer: Self-pay

## 2021-07-09 NOTE — Telephone Encounter (Signed)
LMTCB for lab results.  

## 2021-07-10 HISTORY — PX: AUGMENTATION MAMMAPLASTY: SUR837

## 2021-07-11 LAB — SPECIMEN STATUS REPORT

## 2021-07-11 LAB — HPV APTIMA: HPV Aptima: NEGATIVE

## 2021-07-15 ENCOUNTER — Telehealth: Payer: Self-pay

## 2021-07-15 NOTE — Telephone Encounter (Signed)
LMTCB for lab results.  

## 2021-07-21 ENCOUNTER — Other Ambulatory Visit: Payer: Self-pay | Admitting: Family

## 2021-07-21 ENCOUNTER — Ambulatory Visit
Admission: RE | Admit: 2021-07-21 | Discharge: 2021-07-21 | Disposition: A | Discharge status: Home / Self Care | Payer: 59 | Source: Ambulatory Visit | Attending: Family | Admitting: Family

## 2021-07-21 ENCOUNTER — Other Ambulatory Visit: Payer: Self-pay

## 2021-07-21 DIAGNOSIS — N941 Unspecified dyspareunia: Secondary | ICD-10-CM | POA: Diagnosis present

## 2021-07-21 DIAGNOSIS — Z0000 Encounter for general adult medical examination without abnormal findings: Secondary | ICD-10-CM | POA: Diagnosis present

## 2021-07-22 ENCOUNTER — Ambulatory Visit
Admission: RE | Admit: 2021-07-22 | Discharge: 2021-07-22 | Disposition: A | Discharge status: Home / Self Care | Payer: 59 | Source: Ambulatory Visit | Attending: Internal Medicine | Admitting: Internal Medicine

## 2021-07-22 DIAGNOSIS — E042 Nontoxic multinodular goiter: Secondary | ICD-10-CM

## 2021-07-24 ENCOUNTER — Telehealth: Payer: Self-pay

## 2021-07-24 NOTE — Telephone Encounter (Signed)
LMTCB for results. 

## 2021-07-25 NOTE — Telephone Encounter (Signed)
See result note.  

## 2021-07-25 NOTE — Telephone Encounter (Signed)
Patient returned office phone call for results 

## 2021-07-26 LAB — CBC WITH DIFFERENTIAL/PLATELET
Basophils Absolute: 0 10*3/uL (ref 0.0–0.2)
Basos: 1 %
EOS (ABSOLUTE): 0.7 10*3/uL — ABNORMAL HIGH (ref 0.0–0.4)
Eos: 12 %
Hematocrit: 27.2 % — ABNORMAL LOW (ref 34.0–46.6)
Hemoglobin: 8.1 g/dL — ABNORMAL LOW (ref 11.1–15.9)
Immature Grans (Abs): 0 10*3/uL (ref 0.0–0.1)
Immature Granulocytes: 0 %
Lymphocytes Absolute: 1.5 10*3/uL (ref 0.7–3.1)
Lymphs: 25 %
MCH: 22.4 pg — ABNORMAL LOW (ref 26.6–33.0)
MCHC: 29.8 g/dL — ABNORMAL LOW (ref 31.5–35.7)
MCV: 75 fL — ABNORMAL LOW (ref 79–97)
Monocytes Absolute: 0.4 10*3/uL (ref 0.1–0.9)
Monocytes: 7 %
Neutrophils Absolute: 3.4 10*3/uL (ref 1.4–7.0)
Neutrophils: 55 %
Platelets: 367 10*3/uL (ref 150–450)
RBC: 3.61 x10E6/uL — ABNORMAL LOW (ref 3.77–5.28)
RDW: 17.3 % — ABNORMAL HIGH (ref 11.7–15.4)
WBC: 6.2 10*3/uL (ref 3.4–10.8)

## 2021-07-26 LAB — COMPREHENSIVE METABOLIC PANEL
ALT: 29 IU/L (ref 0–32)
AST: 49 IU/L — ABNORMAL HIGH (ref 0–40)
Albumin/Globulin Ratio: 1.3 (ref 1.2–2.2)
Albumin: 3.7 g/dL — ABNORMAL LOW (ref 3.8–4.9)
Alkaline Phosphatase: 227 IU/L — ABNORMAL HIGH (ref 44–121)
BUN/Creatinine Ratio: 17 (ref 9–23)
BUN: 11 mg/dL (ref 6–24)
Bilirubin Total: 0.3 mg/dL (ref 0.0–1.2)
CO2: 24 mmol/L (ref 20–29)
Calcium: 8.3 mg/dL — ABNORMAL LOW (ref 8.7–10.2)
Chloride: 106 mmol/L (ref 96–106)
Creatinine, Ser: 0.66 mg/dL (ref 0.57–1.00)
Globulin, Total: 2.9 g/dL (ref 1.5–4.5)
Glucose: 78 mg/dL (ref 65–99)
Potassium: 4.4 mmol/L (ref 3.5–5.2)
Sodium: 142 mmol/L (ref 134–144)
Total Protein: 6.6 g/dL (ref 6.0–8.5)
eGFR: 104 mL/min/{1.73_m2} (ref >59)

## 2021-07-26 LAB — LIPID PANEL
Chol/HDL Ratio: 2.9 ratio (ref 0.0–4.4)
Cholesterol, Total: 125 mg/dL (ref 100–199)
HDL: 43 mg/dL (ref >39)
LDL Chol Calc (NIH): 69 mg/dL (ref 0–99)
Triglycerides: 59 mg/dL (ref 0–149)
VLDL Cholesterol Cal: 13 mg/dL (ref 5–40)

## 2021-07-26 LAB — ALKALINE PHOSPHATASE, ISOENZYMES
BONE FRACTION: 28 % (ref 14–68)
INTESTINAL FRAC.: 3 % (ref 0–18)
LIVER FRACTION: 69 % (ref 18–85)

## 2021-07-26 LAB — IRON,TIBC AND FERRITIN PANEL
Ferritin: 21 ng/mL (ref 15–150)
Iron Saturation: 6 % — CL (ref 15–55)
Iron: 21 ug/dL — ABNORMAL LOW (ref 27–159)
Total Iron Binding Capacity: 370 ug/dL (ref 250–450)
UIBC: 349 ug/dL (ref 131–425)

## 2021-07-26 LAB — B12 AND FOLATE PANEL
Folate: 7.1 ng/mL (ref >3.0)
Vitamin B-12: 1419 pg/mL — ABNORMAL HIGH (ref 232–1245)

## 2021-07-26 LAB — CA 125: Cancer Antigen (CA) 125: 13.1 U/mL (ref 0.0–38.1)

## 2021-07-28 ENCOUNTER — Other Ambulatory Visit: Payer: Self-pay | Admitting: Family

## 2021-07-28 ENCOUNTER — Encounter: Payer: Self-pay | Admitting: Obstetrics and Gynecology

## 2021-07-28 DIAGNOSIS — N941 Unspecified dyspareunia: Secondary | ICD-10-CM

## 2021-07-28 NOTE — Progress Notes (Signed)
close

## 2021-09-24 ENCOUNTER — Ambulatory Visit: Payer: 59 | Admitting: Obstetrics and Gynecology

## 2021-09-24 ENCOUNTER — Encounter: Payer: Self-pay | Admitting: Obstetrics and Gynecology

## 2021-09-24 ENCOUNTER — Other Ambulatory Visit: Payer: Self-pay

## 2021-09-24 VITALS — BP 135/79 | HR 74 | Ht 67.0 in | Wt 178.4 lb

## 2021-09-24 DIAGNOSIS — N9419 Other specified dyspareunia: Secondary | ICD-10-CM | POA: Diagnosis not present

## 2021-09-24 DIAGNOSIS — N952 Postmenopausal atrophic vaginitis: Secondary | ICD-10-CM

## 2021-09-24 LAB — POCT URINALYSIS DIPSTICK
Bilirubin, UA: NEGATIVE
Glucose, UA: NEGATIVE
Ketones, UA: NEGATIVE
Leukocytes, UA: NEGATIVE
Nitrite, UA: NEGATIVE
Protein, UA: POSITIVE — AB
Spec Grav, UA: >=1.03 — AB (ref 1.010–1.025)
Urobilinogen, UA: 0.2 E.U./dL
pH, UA: 6 (ref 5.0–8.0)

## 2021-09-24 MED ORDER — ESTRADIOL 0.1 MG/GM VA CREA: 0.2500 | TOPICAL_CREAM | Freq: Every day | VAGINAL | 0 refills | Status: DC

## 2021-09-24 NOTE — Progress Notes (Signed)
Pain with intercourse and urination, also reports current kidney stones. Patient reports being postmenopausal with no cycle for 10 years. Last pap smear 06/30/21. U/A completed and documented.

## 2021-09-24 NOTE — Progress Notes (Signed)
HPI:      [name] is a 55 y.o. [id number] who LMP was Patient's last menstrual period was 07/11/2016 (exact date).  Subjective:   She presents today as a referral from [employer].  Patient is menopausal and has recently begun having intercourse again.  She reports bleeding the first few times she had intercourse but this is no longer occurring.  She also complains of pain with penetration as well as deep thrusting dyspareunia.  She has experienced no other postmenopausal bleeding.  She feels as if she is lubricated well.  She has not tried lubricants. She recently had a pelvic ultrasound which revealed no significant abnormalities.  Endometrial lining was approximately 5 mm. Of significant note, patient has had a gastric bypass with excellent results of greater than 175 pounds lost.    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 She does not have any pertinent problems on file. She  has a past surgical history that includes Cesarean section (x2  last one 09-10-2000); Bariatric Surgery (08-03-2016    dr Darnell Level @[facility] ,  [place]); Ureteroscopy with holmium laser lithotripsy (Right, 09/10/2017); Cystoscopy with stent placement (Right, 09/10/2017); Extracorporeal shock wave lithotripsy (N/A, 01/30/2019); Extracorporeal shock wave lithotripsy (Right, 03/20/2019); Laparoscopic cholecystectomy (10-15-2016   @[facility]  ,  [place]); DX LAPAROSCOPY/  EGD/  HIATAL HERNIA  REPAIR CLOSURE OF INTERNAL MENSENTRY DEFECT (10-28-2017   @[facility] ,  [place]); Cystoscopy with retrograde pyelogram, ureteroscopy and stent placement (Right, 02-07-2012  @[facility] ); Tonsillectomy and adenoidectomy (child); Cystoscopy/ureteroscopy/holmium laser/stent placement (Right, 04/24/2019); Colonoscopy with propofol (N/A, 09/11/2019); Breast biopsy (Right, 2017); Placement of breast implants (Bilateral); Augmentation mammaplasty; Brachioplasty; Panniculectomy; and Liposuction. Her family history includes Alcohol abuse in her mother; Breast cancer (age of onset: 74) in her maternal grandmother; Cancer in her maternal grandmother; Drug abuse in her sister; Hyperlipidemia in her father and mother; Hypertension in her father and mother; Leukemia in her mother; Liver cancer in her paternal grandfather; Lymphoma in her mother; Parkinson's disease in her father. She  reports that she has never smoked. She has never used smokeless tobacco. She reports that she does not drink alcohol and does not use drugs. She has a current medication list which includes the following prescription(s): calcium, cholecalciferol, multiple vitamins-minerals, mupirocin ointment, prevident 5000 booster plus, sertraline, ursodiol, and vitamin a. She is allergic to latex, tolmetin, gallium nitrate, insulin detemir, nsaids, and gadolinium derivatives.       Review of Systems:  Review of Systems  Constitutional: Denied constitutional symptoms, night sweats, recent illness, fatigue, fever, insomnia and weight loss.  Eyes: Denied eye symptoms, eye pain, photophobia, vision change and visual disturbance.  Ears/Nose/Throat/Neck: Denied ear, nose, throat or neck symptoms, hearing loss, nasal discharge, sinus congestion and sore throat.  Cardiovascular: Denied cardiovascular symptoms, arrhythmia, chest pain/pressure, edema, exercise intolerance, orthopnea and palpitations.  Respiratory: Denied pulmonary symptoms, asthma, pleuritic pain,  productive sputum, cough, dyspnea and wheezing.  Gastrointestinal: Denied, gastro-esophageal reflux, melena, nausea and vomiting.  Genitourinary: See HPI for additional information.  Musculoskeletal: Denied musculoskeletal  symptoms, stiffness, swelling, muscle weakness and myalgia.  Dermatologic: Denied dermatology symptoms, rash and scar.  Neurologic: Denied neurology symptoms, dizziness, headache, neck pain and syncope.  Psychiatric: Denied psychiatric symptoms, anxiety and depression.  Endocrine: Denied endocrine symptoms including hot flashes and night sweats.   Meds:   Current Outpatient Medications on File Prior to Visit  Medication Sig Dispense Refill   CALCIUM PO Take by mouth.     Cholecalciferol (VITAMIN D3 PO) Take by mouth daily.     Multiple Vitamins-Minerals (BARIATRIC MULTIVITAMINS/IRON PO) Take by mouth.     mupirocin ointment (BACTROBAN) 2 % Apply 1 application topically 2 (two) times daily. X 5 days nose 30 g 0   PREVIDENT 5000 BOOSTER PLUS 1.1 % PSTE Place onto teeth as directed.     sertraline (ZOLOFT) 50 MG tablet Take 2 tablets by mouth daily.     ursodiol (ACTIGALL) 500 MG tablet Take 250 mg by mouth in the morning and at bedtime. Take two tablets by mouth in the morning & three tablets by mouth in the evening.     VITAMIN A PO Take by mouth daily.     No current facility-administered medications on file prior to visit.      Objective:     Vitals:   09/24/21 1437  BP: 135/79  Pulse: 74   Filed Weights   09/24/21 1437  Weight: 178 lb 6.4 oz (80.9 kg)              Ultrasound results reviewed directly with the patient.          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   Headache 07/05/2020   Osteomalacia 12/27/2019   History of weight loss 11/28/2019   Primary biliary  cirrhosis ([place]) 11/28/2019   Localized osteoporosis without current pathological fracture 06/26/2019   Hypocalcemia 06/26/2019   Secondary hyperparathyroidism ([place]) 06/26/2019   Multinodular goiter 06/26/2019   Dysphagia 05/05/2019   Osteoporosis 05/03/2019   Memory loss 02/13/2019   Elevated liver function tests 10/13/2017   Epigastric abdominal pain 10/13/2017   History of kidney stones 10/13/2017   Hypertension 10/13/2017   Status post bariatric surgery 09/03/2017   Right ureteral calculus 08/31/2017   Hydronephrosis with urinary obstruction due to ureteral calculus 08/31/2017   Nephrolithiasis 08/31/2017   Screen for colon cancer 07/26/2017   Sleep apnea 10/06/2016   Candidal intertrigo 09/21/2016   H/O gastric bypass 09/21/2016   Hiatal hernia 04/21/2016   Elevated liver enzymes 09/04/2014   Vitamin D deficiency 09/03/2014   Routine general medical examination at a health care facility 01/11/2014     1. Dyspareunia due to medical condition in female   2. Atrophic vaginitis     Patient likely has pain secondary to some vaginal atrophy and possibly lack of lubrication as well as elasticity of a postmenopausal vagina. Because patient had no episodes of postmenopausal bleeding and is not currently having postmenopausal bleeding and the fact that her ultrasound shows approximately 5 mm endometrial thickness I do not feel the need to do an endometrial biopsy at this time.  I believe her risk of endometrial cancer is low.   Plan:            1.  Estrogen vaginal cream every other night -she is to check back in with [place] in  approximately 1 month to see if she has had resolution of her pain.  2.  Lubrication with intercourse  3.  We have discussed endometrial biopsy in detail.  I believe her risk is low based on her ultrasound and the fact that she is not having any postmenopausal bleeding.  I have nevertheless offered her a biopsy and she has declined at this time.  If she changes her  mind or if she begins having postmenopausal bleeding I would recommend the biopsy.  Orders Orders Placed This Encounter  Procedures   POCT urinalysis dipstick    No orders of the defined types were placed in this encounter.     F/U  No follow-ups on file. I spent 31 minutes involved in the care of this patient preparing to see the patient by obtaining and reviewing her medical history (including labs, imaging tests and prior procedures), documenting clinical information in the electronic health record (EHR), counseling and coordinating care plans, writing and sending prescriptions, ordering tests or procedures and in direct communicating with the patient and medical staff discussing pertinent items from her history and physical exam.  Finis Bud, M.D. 09/24/2021 3:10 PM

## 2021-10-13 ENCOUNTER — Other Ambulatory Visit: Payer: Self-pay | Admitting: Urology

## 2021-10-28 NOTE — Progress Notes (Signed)
Patient to arrive at 0600 on 10/30/2021. History and medications reviewed. Pre-procedure instructions given. NPO after MN on Wednesday. Driver secured.

## 2021-10-30 ENCOUNTER — Encounter (HOSPITAL_BASED_OUTPATIENT_CLINIC_OR_DEPARTMENT_OTHER): Payer: Self-pay | Admitting: Urology

## 2021-10-30 ENCOUNTER — Encounter (HOSPITAL_BASED_OUTPATIENT_CLINIC_OR_DEPARTMENT_OTHER)
Admission: RE | Disposition: A | Discharge status: Home / Self Care | Payer: Self-pay | Source: Home / Self Care | Attending: Urology

## 2021-10-30 ENCOUNTER — Other Ambulatory Visit: Payer: Self-pay

## 2021-10-30 ENCOUNTER — Ambulatory Visit (HOSPITAL_BASED_OUTPATIENT_CLINIC_OR_DEPARTMENT_OTHER)
Admission: RE | Admit: 2021-10-30 | Discharge: 2021-10-30 | Disposition: A | Discharge status: Home / Self Care | Payer: 59 | Attending: Urology | Admitting: Urology

## 2021-10-30 ENCOUNTER — Ambulatory Visit (HOSPITAL_COMMUNITY): Payer: 59

## 2021-10-30 DIAGNOSIS — N200 Calculus of kidney: Secondary | ICD-10-CM | POA: Diagnosis not present

## 2021-10-30 DIAGNOSIS — M8580 Other specified disorders of bone density and structure, unspecified site: Secondary | ICD-10-CM | POA: Insufficient documentation

## 2021-10-30 DIAGNOSIS — Z9884 Bariatric surgery status: Secondary | ICD-10-CM | POA: Insufficient documentation

## 2021-10-30 DIAGNOSIS — Z87442 Personal history of urinary calculi: Secondary | ICD-10-CM | POA: Insufficient documentation

## 2021-10-30 HISTORY — PX: EXTRACORPOREAL SHOCK WAVE LITHOTRIPSY: SHX1557

## 2021-10-30 SURGERY — LITHOTRIPSY, ESWL
Anesthesia: LOCAL | Laterality: Left

## 2021-10-30 MED ORDER — DIAZEPAM 5 MG PO TABS
10.0000 mg | ORAL_TABLET | ORAL | Status: AC
  Administered 2021-10-30: 07:00:00 10 mg via ORAL

## 2021-10-30 MED ORDER — DIAZEPAM 5 MG PO TABS
ORAL_TABLET | ORAL | Status: AC
  Filled 2021-10-30: qty 2

## 2021-10-30 MED ORDER — CEFAZOLIN SODIUM-DEXTROSE 2-4 GM/100ML-% IV SOLN
2.0000 g | INTRAVENOUS | Status: AC
  Administered 2021-10-30: 07:00:00 2 g via INTRAVENOUS

## 2021-10-30 MED ORDER — DIPHENHYDRAMINE HCL 25 MG PO CAPS
ORAL_CAPSULE | ORAL | Status: AC
  Filled 2021-10-30: qty 1

## 2021-10-30 MED ORDER — TRAMADOL HCL 50 MG PO TABS: 50.0000 mg | ORAL_TABLET | Freq: Four times a day (QID) | ORAL | 0 refills | Status: DC | PRN

## 2021-10-30 MED ORDER — TAMSULOSIN HCL 0.4 MG PO CAPS: 0.4000 mg | ORAL_CAPSULE | Freq: Every day | ORAL | 0 refills | Status: DC

## 2021-10-30 MED ORDER — SODIUM CHLORIDE 0.9 % IV SOLN: INTRAVENOUS | Status: DC

## 2021-10-30 MED ORDER — DIPHENHYDRAMINE HCL 25 MG PO CAPS
25.0000 mg | ORAL_CAPSULE | ORAL | Status: AC
  Administered 2021-10-30: 07:00:00 25 mg via ORAL

## 2021-10-30 MED ORDER — CEFAZOLIN SODIUM-DEXTROSE 2-4 GM/100ML-% IV SOLN
INTRAVENOUS | Status: AC
  Filled 2021-10-30: qty 100

## 2021-10-30 NOTE — Op Note (Signed)
See [facility] OP note scanned into chart. Also because of the size, density, location and other factors that cannot be anticipated I feel this will likely be a staged procedure. This fact supersedes any indication in the scanned [facility] operative note to the contrary.  

## 2021-10-30 NOTE — H&P (Signed)
F/u -   1) kidney stones - She has a h/o stones and a number of lithotripsies and ureteroscopies. She does not do that well with a stent. 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   She underwent 03/20 left distal ESWL, 05/20 right renal pelvis ESWL followed by 06/20 right URS with string.   F/u 02/22 CT without hydro and bilateral stones largest 6 mm RLP and 8 mm LLP. She had nausea and emesis 03/22, with urgency and hesitancy. She passed a stone. Symptoms resolved. F/u UA clear. She declined imaging.   She sees endocrine and had an elevated PTH recently - Dr. Kelton Pillar. Pt has thyroid issues and osteopenia. PTH = 80 (15-65) on 01/24/2021 which Dr. Chauncey Cruel reports as secondary due to low calcium absorption and low vitamin D, all due to history of gastric bypass. We suspect primary hypercalcemia if calcium is high, but she has never had that issue.   [place] today 10/22 benign. No hydro. KUB with 11 mm LMP stone. No flank pain or stone passage.     08/21/2021: seen for the above. She has had dysuria and urgency. She saw bacteria on her urine and cx grew e coli. She's been abx for plastic surgery and post-op infection May 2022 and then more plastic surgery Sep 2022.    She works at [employer] and [employer] in [employer].   08/29/2021: Patient currently being scheduled for shockwave lithotripsy to treat an enlarging but nonobstructing left renal calculus. Patient was treated for an infection based on last available culture data at last office visit with nitrofurantoin. Symptoms stabilized and improved while on the antibiotic but upon completion earlier this week she has had rapid return of cystitis symptoms. She is endorsing increased frequency/urgency, bladder pain and discomfort as well as moderately severe urethral burning. Denies hematuria, interval stone material passage, left-sided pain or discomfort suggestive of obstructive uropathy. She states having a low-grade fever earlier this  week but that has since resolved. She has had some nausea but no vomiting. Also with some diarrhea.     ALLERGIES: Contrast Dyes latex Levemir NSAIDs    MEDICATIONS: Calcium  Multivitamin  Ursodiol  Vitamin A  Vitamin D3  Vitamin E  Zoloft     GU PSH: Cystoscopy Insert Stent, Right - 2020 Cystoscopy Ureteroscopy, Right - 2020 ESWL, Right - 2020, Left - 2020       PSH Notes: skin removal from skin and arms   NON-GU PSH: Breast augmentation Breast Prosthesis, Not Otherwise Specified Cholecystectomy (laparoscopic)     GU PMH: Acute Cystitis/UTI, Course of NF and I ordered cefazolin for her eswl. - 08/21/2021 Renal calculus, The left renal stone has enlarged and we discussed the nature r/b/a to left ESWL vs AS vs URS> She'll proceed with left ESWL. - 08/21/2021, I gave her a 24hr urine form. I'll message Dr. Kelton Pillar. Will refer to Dr. Harlow Asa for eval if indicated. , - 01/30/2021, - 12/19/2020, Increase citrate. Check 24hr urine. , - 05/02/2020, - 2020, - 2020, - 2020, - 2020, - 2020 Flank Pain - 12/19/2020, - 2020 Microscopic hematuria - 12/19/2020 Ureteral calculus - 12/19/2020, (Improving), Right, - 2020, - 2020, - 2020, - 2020, - 2020      PMH Notes: spinal fracture   NON-GU PMH: Diabetes Type 2 Hypertension Sleep Apnea    FAMILY HISTORY: Death - Mother Kidney Stones - Runs in Family   SOCIAL HISTORY: Marital Status: Single Preferred Language: English; Ethnicity: Not Hispanic Or  Latino; Race: White Current Smoking Status: Patient has never smoked.   Tobacco Use Assessment Completed: Used Tobacco in last 30 days? Does not drink anymore.  Drinks 4+ caffeinated drinks per day. 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     Notes: 2 daughters   REVIEW OF SYSTEMS:    GU Review Female:   Patient reports frequent urination, hard to postpone urination, burning /pain with urination, get up at night to urinate, and trouble starting your stream. Patient denies leakage of  urine, stream starts and stops, have to strain to urinate, and being pregnant.  Gastrointestinal (Upper):   Patient reports nausea. Patient denies vomiting and indigestion/ heartburn.  Gastrointestinal (Lower):   Patient reports diarrhea. Patient denies constipation.  Constitutional:   Patient denies fever, night sweats, weight loss, and fatigue.  Skin:   Patient denies skin rash/ lesion and itching.  Eyes:   Patient denies blurred vision and double vision.  Ears/ Nose/ Throat:   Patient denies sore throat and sinus problems.  Hematologic/Lymphatic:   Patient denies swollen glands and easy bruising.  Cardiovascular:   Patient denies leg swelling and chest pains.  Respiratory:   Patient denies cough and shortness of breath.  Endocrine:   Patient denies excessive thirst.  Musculoskeletal:   Patient reports back pain and joint pain.   Neurological:   Patient denies headaches and dizziness.  Psychologic:   Patient denies depression and anxiety.   VITAL SIGNS:      08/29/2021 10:00 AM  Weight 176 lb / 79.83 kg  Height 67 in / 170.18 cm  BP 159/90 mmHg  Pulse 65 /min  Temperature 97.3 F / 36.2 C  BMI 27.6 kg/m   MULTI-SYSTEM PHYSICAL EXAMINATION:    Constitutional: Well-nourished. No physical deformities. Normally developed. Good grooming.  Neck: Neck symmetrical, not swollen. Normal tracheal position.  Respiratory: No labored breathing, no use of accessory muscles.   Cardiovascular: Normal temperature, normal extremity pulses, no swelling, no varicosities.  Neurologic / Psychiatric: Oriented to time, oriented to place, oriented to person. No depression, no anxiety, no agitation.  Gastrointestinal: No mass, no tenderness, no rigidity, non obese abdomen.  Musculoskeletal: Normal gait and station of head and neck.     Complexity of Data:  Source Of History:  Patient, Medical Record Summary  Records Review:   Previous Doctor Records, Previous Hospital Records, Previous Patient Records   Urine Test Review:   Urinalysis, Urine Culture  X-Ray Review: KUB: Reviewed Films.  Renal Ultrasound: Reviewed Films.     08/29/21 08/29/21  Urinalysis  Urine Appearance Cloudy  Cloudy   Urine Color Yellow  Yellow   Urine Glucose Neg  Neg mg/dL  Urine Bilirubin Neg  Neg mg/dL  Urine Ketones Neg  Neg mg/dL  Urine Specific Gravity 1.020  1.020   Urine Blood 3+  3+ ery/uL  Urine pH 5.5  5.5   Urine Protein 1+  1+ mg/dL  Urine Urobilinogen 0.2  0.2 mg/dL  Urine Nitrites Positive  Positive   Urine Leukocyte Esterase 2+  2+ leu/uL  Urine WBC/hpf 20 - 40/hpf  20 - 40/hpf   Urine RBC/hpf 20 - 40/hpf  20 - 40/hpf   Urine Epithelial Cells 0 - 5/hpf  0 - 5/hpf   Urine Bacteria Many (>50/hpf)  Many (>50/hpf)   Urine Mucous Not Present  Not Present   Urine Yeast NS (Not Seen)  NS (Not Seen)   Urine Trichomonas Not Present  Not Present   Urine Cystals Ca Oxalate  Ca Oxalate  Urine Casts NS (Not Seen)  NS (Not Seen)   Urine Sperm Not Present  Not Present    PROCEDURES:          Urinalysis w/Scope - 81001 Dipstick Dipstick Cont'd Micro  Color: Yellow Bilirubin: Neg WBC/hpf: 20 - 40/hpf  Appearance: Cloudy Ketones: Neg RBC/hpf: 20 - 40/hpf  Specific Gravity: 1.020 Blood: 3+ Bacteria: Many (>50/hpf)  pH: 5.5 Protein: 1+ Cystals: Ca Oxalate  Glucose: Neg Urobilinogen: 0.2 Casts: NS (Not Seen)    Nitrites: Positive Trichomonas: Not Present    Leukocyte Esterase: 2+ Mucous: Not Present      Epithelial Cells: 0 - 5/hpf      Yeast: NS (Not Seen)      Sperm: Not Present    Notes:            Ceftriaxone 1g - 09381, W2993 Qty: 1 Adm. By: Alcide Goodness  Unit: gram Lot No [id number]  Route: IM Exp. Date 10/10/2023  Freq: None Mfgr.:   Site: Right Hip   ASSESSMENT:      ICD-10 Details  1 GU:   Renal calculus - N20.0 Left, Chronic, Worsening  2   Acute Cystitis/UTI - N30.00 Acute, Systemic Symptoms   PLAN:            Medications New Meds: Cephalexin 500 mg capsule 1 capsule PO TID    #21  0 Refill(s)            Orders Labs Urine Culture          Schedule Return Visit/Planned Activity: Keep Scheduled Appointment - Schedule Surgery  Procedure: 08/29/2021 at [facility] - [address] - Ceftriaxone 1g (Injection, Ceftriaxone Sodium, Per 250 Mg) - E9381, 01751          Document Letter(s):  Created for Patient: Clinical Summary         Notes:   Patient has had recurrence of UTI symptoms. Presentation today not consistent with obstructive uropathy from her known enlarging left renal calculus. I will restart treatment for infection with injection of Rocephin given today. She will begin cephalexin 3 times daily for the next week. I will consult with her urologist and likely have her continue with suppressive antimicrobial dose until time of her pending/upcoming shockwave lithotripsy.

## 2021-10-30 NOTE — Discharge Instructions (Signed)
See [facility] discharge instructions in chart.  

## 2021-11-04 ENCOUNTER — Encounter (HOSPITAL_BASED_OUTPATIENT_CLINIC_OR_DEPARTMENT_OTHER): Payer: Self-pay | Admitting: Urology

## 2022-01-09 ENCOUNTER — Telehealth: Payer: Self-pay | Admitting: Family

## 2022-01-09 ENCOUNTER — Ambulatory Visit
Admission: RE | Admit: 2022-01-09 | Discharge: 2022-01-09 | Disposition: A | Discharge status: Home / Self Care | Payer: Managed Care, Other (non HMO) | Source: Ambulatory Visit | Attending: Family | Admitting: Family

## 2022-01-09 ENCOUNTER — Encounter: Payer: Self-pay | Admitting: Family

## 2022-01-09 ENCOUNTER — Other Ambulatory Visit: Payer: Self-pay

## 2022-01-09 ENCOUNTER — Ambulatory Visit: Payer: Managed Care, Other (non HMO) | Admitting: Family

## 2022-01-09 VITALS — BP 161/88 | HR 71 | Temp 98.4°F | Ht 68.0 in | Wt 177.2 lb

## 2022-01-09 DIAGNOSIS — R140 Abdominal distension (gaseous): Secondary | ICD-10-CM | POA: Insufficient documentation

## 2022-01-09 DIAGNOSIS — R935 Abnormal findings on diagnostic imaging of other abdominal regions, including retroperitoneum: Secondary | ICD-10-CM

## 2022-01-09 MED ORDER — IOHEXOL 300 MG/ML  SOLN
100.0000 mL | Freq: Once | INTRAMUSCULAR | Status: AC | PRN
  Administered 2022-01-09: 100 mL via INTRAVENOUS

## 2022-01-09 NOTE — Assessment & Plan Note (Addendum)
Discussed excess gas and likely contributory.  She has a history of bariatric surgery duodenal switch, more recently panniculectomy 03/2021.  Presentation is concerning for postoperative changes.  She is afebrile and nontoxic in appearance.  There is obvious tenderness with deep palpation in the lower abdomen beneath surgical scar.  We agreed stat CT abdomen and pelvis to further evaluate etiology.  No obvious hernia with cough today.  She does have a history of hernia.  Pending labs which she prefers to obtain at [place] her to call Dr. Dellia Nims, plastic surgeon to schedule sooner follow-up.  Chronic loose to diarrhea school stools.  This is not unusual for her and she is not concerned as this has been her baseline since her duodenal switch.  ?Of note, [place] ordered after 5 years without menstrual periods.  Patient would like reassurance as she is currently sexually active.  Advised her clinically she is menopausal and it would be very unlikely that she would be able to be become pregnant however this is not impossible.  ?encouraged probiotics and avoidance of sugar. Close follow up.  ?

## 2022-01-09 NOTE — Patient Instructions (Addendum)
Urine and labs ordered for [facility].  Please obtain these as soon as possible  ? ?as discussed, please call Dr. Dellia Nims and advise him of symptoms. ? ? ?CT of abdomen today , I will call you with urgent results.  If nonurgent, we will wait until you may see plastic surgery.  Please  schedule a sooner appointment.  Certainly if symptoms were to progress or worsen over the weekend or you develop fever, nausea vomiting more severe pain, please go to emergency room for in person reevaluation ?

## 2022-01-09 NOTE — Progress Notes (Signed)
Subjective:    Patient ID: [name], female    DOB: 05-29-1966, 56 y.o.   MRN: [id number]  CC: [name] is a 56 y.o. female who presents today for an acute visit.    HPI: Acute  right lower abdomin swelling and 'hard' , more noticeable x 5 days  No fever, chills, nausea, vomiting, constipation, epigastric burning Endorses excess gas. She thinks exacerbated by candy of late.She avoids caffeine and articifical sugar.  No alcohol use.     She reports that since duodenal switch, she has had loose to watery brown stools since then.  Happens multiple times a day.  Nonbloody.  No recent antibiotics.  Gas-X is not helpful.  She has a follow-up with Dr. Darnell Level in 2 weeks time.  She has not had a.  In 5 years.  She is currently sexually active.  She did take a home pregnancy test because of the abdominal swelling which was negative.  She would like to know if she is in menopause  Dr Louis Meckel lithotripsy 10/30/21  History of bariatric surgery duodenal switch with hernia repair 07/2016, Dr Darnell Level History of panniculectomy,arm lift 03/2021 Dr High  Breast lift and implants 07/2021  History of melanoma, primary biliary cirrhosis  History of cholecystectomy 2017 HISTORY:  Past Medical History:  Diagnosis Date   Asthma due to seasonal allergies    no inhaler   History of diabetes mellitus, type II    per pt resolved after bariatric surgery 2017   History of hypertension    per pt resolved after bariatric surgery 2017   History of kidney stones    History of obstructive sleep apnea    per pt hx osa w/ cpap,  retested after bariatric surgery 2017 result negative   History of skin cancer    nose, not melanoma   Hydronephrosis, right    Hyperlipidemia    MRSA carrier    NAFL (nonalcoholic fatty liver)    Osteoporosis    Primary biliary cirrhosis ([place])    Primary biliary cirrhosis ([place])    Renal calculi    per CT 04-18-2019 bilateral , non-obstructive   S/P  bariatric surgery 08/03/2016   duodenal switch w/ hh repair   Ureteral calculus, right    Wears glasses    Past Surgical History:  Procedure Laterality Date   AUGMENTATION MAMMAPLASTY     BARIATRIC SURGERY  08-03-2016    dr Darnell Level @[facility] ,  [place]   LAPAROSCOPY DUODENAL SWITCH AND HIATAL HERNIA REPAIR   BRACHIOPLASTY     BREAST BIOPSY Right 2017   FIBROCYSTIC CHANGES WITH CALCIFICATIONS   CESAREAN SECTION  x2  last one 09-10-2000   COLONOSCOPY WITH PROPOFOL N/A 09/11/2019   Procedure: COLONOSCOPY WITH PROPOFOL;  Surgeon: Jonathon Bellows, MD;  Location: [facility] ENDOSCOPY;  Service: Gastroenterology;  Laterality: N/A;   CYSTOSCOPY WITH RETROGRADE PYELOGRAM, URETEROSCOPY AND STENT PLACEMENT Right 02-07-2012  @[facility]    CYSTOSCOPY WITH STENT PLACEMENT Right 09/10/2017   Procedure: CYSTOSCOPY WITH STENT PLACEMENT;  Surgeon: Abbie Sons, MD;  Location: [facility] ORS;  Service: Urology;  Laterality: Right;   CYSTOSCOPY/URETEROSCOPY/HOLMIUM LASER/STENT PLACEMENT Right 04/24/2019   Procedure: CYSTOSCOPY/URETEROSCOPY/STENT PLACEMENT/ RIGHT RETROGRADE;  Surgeon: Festus Aloe, MD;  Location[facility];  Service: Urology;  Laterality: Right;   DX LAPAROSCOPY/  EGD/  HIATAL HERNIA REPAIR CLOSURE OF INTERNAL MENSENTRY DEFECT  10-28-2017   @[facility] ,  [place]   EXTRACORPOREAL SHOCK WAVE LITHOTRIPSY N/A 01/30/2019   Procedure: EXTRACORPOREAL SHOCK WAVE LITHOTRIPSY (ESWL)  LEFT/ POSSIBLE RIGHT;  Surgeon: Festus Aloe, MD;  Location: [facility] ORS;  Service: Urology;  Laterality: N/A;   EXTRACORPOREAL SHOCK WAVE LITHOTRIPSY Right 03/20/2019   Procedure: EXTRACORPOREAL SHOCK WAVE LITHOTRIPSY (ESWL);  Surgeon: Franchot Gallo, MD;  Location: [facility] ORS;  Service: Urology;  Laterality: Right;   EXTRACORPOREAL SHOCK WAVE LITHOTRIPSY Left 10/30/2021   Procedure: EXTRACORPOREAL SHOCK WAVE LITHOTRIPSY (ESWL);  Surgeon: Ardis Hughs, MD;  Location: [facility];  Service: Urology;  Laterality:  Left;   LAPAROSCOPIC CHOLECYSTECTOMY  10-15-2016   @[facility]  ,  [place]   LIPOSUCTION     PANNICULECTOMY     PLACEMENT OF BREAST IMPLANTS Bilateral    TONSILLECTOMY AND ADENOIDECTOMY  child   URETEROSCOPY WITH HOLMIUM LASER LITHOTRIPSY Right 09/10/2017   Procedure: URETEROSCOPY WITH HOLMIUM LASER LITHOTRIPSY;  Surgeon: Abbie Sons, MD;  Location: [facility] ORS;  Service: Urology;  Laterality: Right;   Family History  Problem Relation Age of Onset   Alcohol abuse Mother    Hyperlipidemia Mother    Hypertension Mother    Lymphoma Mother        primary site : brain   Leukemia Mother    Hyperlipidemia Father    Hypertension Father    Parkinson's disease Father    Drug abuse Sister    Cancer Maternal Grandmother        breast cancer   Breast cancer Maternal Grandmother 86   Liver cancer Paternal Grandfather    Kidney cancer Neg Hx    Prostate cancer Neg Hx     Allergies: Latex, Tolmetin, Gallium nitrate, Insulin detemir, Nsaids, and Gadolinium derivatives Current Outpatient Medications on File Prior to Visit  Medication Sig Dispense Refill   CALCIUM PO Take by mouth.     Cholecalciferol (VITAMIN D3 PO) Take by mouth daily.     estradiol (ESTRACE) 0.1 MG/GM vaginal cream Place 3.50 Applicatorfuls vaginally at bedtime. 90 g 0   Multiple Vitamins-Minerals (BARIATRIC MULTIVITAMINS/IRON PO) Take by mouth.     mupirocin ointment (BACTROBAN) 2 % Apply 1 application topically 2 (two) times daily. X 5 days nose 30 g 0   PREVIDENT 5000 BOOSTER PLUS 1.1 % PSTE Place onto teeth as directed.     ursodiol (ACTIGALL) 500 MG tablet Take 250 mg by mouth in the morning and at bedtime. Take two tablets by mouth in the morning & three tablets by mouth in the evening.     VITAMIN A PO Take by mouth daily.     No current facility-administered medications on file prior to visit.    Social History   Tobacco Use   Smoking status: Never   Smokeless tobacco: Never  Vaping Use   Vaping Use: Never used   Substance Use Topics   Alcohol use: No    Alcohol/week: 0.0 standard drinks   Drug use: Never    Review of Systems  Constitutional:  Negative for chills and fever.  Respiratory:  Negative for cough.   Cardiovascular:  Negative for chest pain and palpitations.  Gastrointestinal:  Positive for abdominal distention, abdominal pain and diarrhea. Negative for blood in stool, constipation, nausea and vomiting.  Genitourinary:  Negative for difficulty urinating, dysuria and vaginal bleeding.     Objective:    BP (!) 161/88 (BP Location: Left Arm, Patient Position: Sitting, Cuff Size: Normal)    Pulse 71    Temp 98.4 F (36.9 C) (Oral)    Ht 5\' 8"  (1.727 m)    Wt 177 lb 3.2 oz (80.4  kg)    LMP 07/11/2016 (Exact Date)    SpO2 99%    BMI 26.94 kg/m    Physical Exam Vitals reviewed.  Constitutional:      Appearance: Normal appearance. She is well-developed.  Eyes:     Conjunctiva/sclera: Conjunctivae normal.  Cardiovascular:     Rate and Rhythm: Normal rate and regular rhythm.     Pulses: Normal pulses.     Heart sounds: Normal heart sounds.  Pulmonary:     Effort: Pulmonary effort is normal.     Breath sounds: Normal breath sounds. No wheezing, rhonchi or rales.  Abdominal:     General: Bowel sounds are normal. There is no distension.     Palpations: Abdomen is soft. Abdomen is not rigid. There is no fluid wave or mass.     Tenderness: There is abdominal tenderness in the right lower quadrant and left lower quadrant. There is no guarding or rebound.       Comments: Surgical scars as noted as on diagram.  Beneath transverse scar, the area is enlarged.  The area is nonfluctuant, no increased heat.  No erythema.  Tender with deep palpation and firm.   Skin:    General: Skin is warm and dry.  Neurological:     Mental Status: She is alert.  Psychiatric:        Speech: Speech normal.        Behavior: Behavior normal.        Thought Content: Thought content normal.        Assessment & Plan:   Problem List Items Addressed This Visit       Other   Abdominal distention - Primary    Discussed excess gas and likely contributory.  She has a history of bariatric surgery duodenal switch, more recently panniculectomy 03/2021.  Presentation is concerning for postoperative changes.  She is afebrile and nontoxic in appearance.  There is obvious tenderness with deep palpation in the lower abdomen beneath surgical scar.  We agreed stat CT abdomen and pelvis to further evaluate etiology.  No obvious hernia with cough today.  She does have a history of hernia.  Pending labs which she prefers to obtain at [place] her to call Dr. Dellia Nims, plastic surgeon to schedule sooner follow-up.  Chronic loose to diarrhea school stools.  This is not unusual for her and she is not concerned as this has been her baseline since her duodenal switch.  Of note, [place] ordered after 5 years without menstrual periods.  Patient would like reassurance as she is currently sexually active.  Advised her clinically she is menopausal and it would be very unlikely that she would be able to be become pregnant however this is not impossible.  encouraged probiotics and avoidance of sugar. Close follow up.       Relevant Orders   CBC with Differential/Platelet   Comprehensive metabolic panel   FSH   Urinalysis, Routine w reflex microscopic   Urine Culture   CT ABDOMEN PELVIS W CONTRAST       I have discontinued Nyara D. Danek's sertraline, traMADol, and tamsulosin. I am also having her maintain her ursodiol, PreviDent 5000 Booster Plus, Multiple Vitamins-Minerals (BARIATRIC MULTIVITAMINS/IRON PO), CALCIUM PO, mupirocin ointment, VITAMIN A PO, Cholecalciferol (VITAMIN D3 PO), and estradiol.   No orders of the defined types were placed in this encounter.   Return precautions given.   Risks, benefits, and alternatives of the medications and treatment plan prescribed today were discussed, and  patient expressed understanding.   Education regarding symptom management and diagnosis given to patient on AVS.  Continue to follow with [name], FNP for routine health maintenance.   [name] and I agreed with plan.   [name], FNP

## 2022-01-09 NOTE — Telephone Encounter (Signed)
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 She is concerned for bruising.  ?Denies increased heat, redness, rash over lower abdomen.  ?She has chronic low back pain however unchanged. Describes occassional muscle spasm. H/o osteoporosis.  ? ?Advised that she go to ED tonight for inflammatory markers, MRI thoracic and lumbar spine for evaluation of osteomyelitis. Discussed concern for early cellulitis which can spread to back and very dangerous infection. She declines as she doesn't feel back pain has changed .  ?She does note IV health bar in the last week which she does approx 1 /month.  ?She prefers to have labs done as [facility] and declines labs stat at [place]. She would like to go in 3 days on Monday to have labs drawn. I agreed to order Crp, sed rate, cmp, cbc , mri studies. She will remain extra viligant and any concerns for skin infection or back pain, fever, general malaise, she understands to go immediately to the ED.  ?

## 2022-01-12 ENCOUNTER — Telehealth: Payer: Self-pay | Admitting: Family

## 2022-01-12 ENCOUNTER — Encounter: Payer: Self-pay | Admitting: Family

## 2022-01-12 NOTE — Telephone Encounter (Signed)
Thanks rasheedah, please DISCONTINUE MRI orders from chart. I will await lab orders ? ?Jenate ?Call pt ?I have canceled MRI as they had constrast. I will confer with radiology to see if without contrast will help identify infection.  ? ?How is she feeling ? How is lower abdominal swelling?  Does she have any heat redness increased swelling or pain? ? ?Please ask about back pain and if any change or certainly increase in pain. ? ?I do not have labs back yet. Did she have these done today? ?

## 2022-01-12 NOTE — Telephone Encounter (Signed)
Was unable to speak to patient but voicemail was full therefore I was unable to leave message ?

## 2022-01-12 NOTE — Telephone Encounter (Signed)
error 

## 2022-01-12 NOTE — Telephone Encounter (Signed)
I spoke with pt to sch the MRI's stat. Pt stated she's not able to do the MRI's she has a schedule conflict and she will wait until the labs come back before having the scans done and call back if needed. Also pt has a allergy to Gallium nitrate contrast the last time pt stated she coded.   ? ?Thank you! ?

## 2022-01-13 ENCOUNTER — Ambulatory Visit: Admission: RE | Admit: 2022-01-13 | Payer: Managed Care, Other (non HMO) | Source: Ambulatory Visit

## 2022-01-13 ENCOUNTER — Ambulatory Visit: Payer: Managed Care, Other (non HMO)

## 2022-01-13 ENCOUNTER — Telehealth: Payer: Self-pay | Admitting: Family

## 2022-01-13 DIAGNOSIS — R300 Dysuria: Secondary | ICD-10-CM

## 2022-01-13 LAB — CBC WITH DIFFERENTIAL/PLATELET
Basophils Absolute: 0 10*3/uL (ref 0.0–0.2)
Basos: 1 %
EOS (ABSOLUTE): 0.2 10*3/uL (ref 0.0–0.4)
Eos: 4 %
Hematocrit: 29 % — ABNORMAL LOW (ref 34.0–46.6)
Hemoglobin: 8.6 g/dL — ABNORMAL LOW (ref 11.1–15.9)
Immature Grans (Abs): 0 10*3/uL (ref 0.0–0.1)
Immature Granulocytes: 0 %
Lymphocytes Absolute: 2 10*3/uL (ref 0.7–3.1)
Lymphs: 39 %
MCH: 22.6 pg — ABNORMAL LOW (ref 26.6–33.0)
MCHC: 29.7 g/dL — ABNORMAL LOW (ref 31.5–35.7)
MCV: 76 fL — ABNORMAL LOW (ref 79–97)
Monocytes Absolute: 0.5 10*3/uL (ref 0.1–0.9)
Monocytes: 9 %
Neutrophils Absolute: 2.5 10*3/uL (ref 1.4–7.0)
Neutrophils: 47 %
Platelets: 306 10*3/uL (ref 150–450)
RBC: 3.81 x10E6/uL (ref 3.77–5.28)
RDW: 17.9 % — ABNORMAL HIGH (ref 11.7–15.4)
WBC: 5.3 10*3/uL (ref 3.4–10.8)

## 2022-01-13 LAB — C-REACTIVE PROTEIN: CRP: <1 mg/L (ref 0–10)

## 2022-01-13 LAB — COMPREHENSIVE METABOLIC PANEL
ALT: 28 IU/L (ref 0–32)
AST: 43 IU/L — ABNORMAL HIGH (ref 0–40)
Albumin/Globulin Ratio: 1.3 (ref 1.2–2.2)
Albumin: 3.8 g/dL (ref 3.8–4.9)
Alkaline Phosphatase: 195 IU/L — ABNORMAL HIGH (ref 44–121)
BUN/Creatinine Ratio: 17 (ref 9–23)
BUN: 11 mg/dL (ref 6–24)
Bilirubin Total: <0.2 mg/dL (ref 0.0–1.2)
CO2: 19 mmol/L — ABNORMAL LOW (ref 20–29)
Calcium: 8.5 mg/dL — ABNORMAL LOW (ref 8.7–10.2)
Chloride: 110 mmol/L — ABNORMAL HIGH (ref 96–106)
Creatinine, Ser: 0.65 mg/dL (ref 0.57–1.00)
Globulin, Total: 2.9 g/dL (ref 1.5–4.5)
Glucose: 74 mg/dL (ref 70–99)
Potassium: 4.1 mmol/L (ref 3.5–5.2)
Sodium: 142 mmol/L (ref 134–144)
Total Protein: 6.7 g/dL (ref 6.0–8.5)
eGFR: 104 mL/min/{1.73_m2} (ref >59)

## 2022-01-13 LAB — URINALYSIS, ROUTINE W REFLEX MICROSCOPIC
Bilirubin, UA: NEGATIVE
Glucose, UA: NEGATIVE
Ketones, UA: NEGATIVE
Nitrite, UA: POSITIVE — AB
Protein,UA: NEGATIVE
RBC, UA: NEGATIVE
Specific Gravity, UA: 1.012 (ref 1.005–1.030)
Urobilinogen, Ur: 0.2 mg/dL (ref 0.2–1.0)
pH, UA: 6.5 (ref 5.0–7.5)

## 2022-01-13 LAB — MICROSCOPIC EXAMINATION
Casts: NONE SEEN /lpf
Epithelial Cells (non renal): NONE SEEN /hpf (ref 0–10)
RBC, Urine: NONE SEEN /hpf (ref 0–2)

## 2022-01-13 LAB — FOLLICLE STIMULATING HORMONE: FSH: 42.5 m[IU]/mL

## 2022-01-13 LAB — SEDIMENTATION RATE: Sed Rate: 57 mm/hr — ABNORMAL HIGH (ref 0–40)

## 2022-01-13 NOTE — Telephone Encounter (Signed)
Pt must have had her labs drawn somewhere else. It doesn't look like the urine culture was collected. And they will not add on a urine culture. Pt would need to recollect another urine for culture. ?

## 2022-01-13 NOTE — Telephone Encounter (Signed)
[name] and [name] ?Can we ensure urine culture via [facility] is processing? I ordered again to be sure. Urine analysis has been collected yesterday ? ? ?I spoke with pt today ?Lower abdominal swelling is unchanged.  No fever, erythema rash.  No back pain. ? ?She endorses odor to urine, urinary frequency.  ?Discussed slight elevation in sedimentation rate.  Normal CRP.  No elevation of neutrophils or white blood cells.  Urine culture shows leukocytes, nitrates.  Advised that  will result all labs once they are available. ? ?Also advised her of discussion with radiologist per below ? ?I consulted with radiology on the phone 01/13/22 Dr Elmer Picker M.D. regarding CT a/p. He assured me no focal abscess in soft tissue. He questioned scarring. There is no break in cortical margins , fluid collection or pockets of air upon re -review of thoracic lumbar spine. He advised that though contrast preferable, he would be able to assess patient with MRI lumbar and thoracic WITHOUT contrast and further evaluate for diskitis due to patient allergy.  ? ?Plan:  ?She has follow-up with Dr. Dellia Nims, plastic surgery next week.  Advised her to discuss symptoms and lower abdominal swelling.  Advised her to ask him if there was any band that she could wear for added support particularly with her line of work ?In the absence of back pain, we discussed likely DDD and osteoporosis,  ?he declines MRI WITHOUT CONTRAST  (she cannot have MRI with contrast).  ?Discussed urinalysis and she politely declines starting antibiotics ahead of urine culture.  She has no concerns for skin infection at this time.  She prefers to follow-up with Dr. Dellia Nims.  She will let me know how she is doing ? ?

## 2022-01-15 ENCOUNTER — Other Ambulatory Visit: Payer: Self-pay

## 2022-01-15 ENCOUNTER — Telehealth: Payer: Self-pay

## 2022-01-15 ENCOUNTER — Encounter: Payer: Self-pay | Admitting: Family

## 2022-01-15 DIAGNOSIS — K743 Primary biliary cirrhosis: Secondary | ICD-10-CM

## 2022-01-15 DIAGNOSIS — R748 Abnormal levels of other serum enzymes: Secondary | ICD-10-CM

## 2022-01-15 DIAGNOSIS — R1013 Epigastric pain: Secondary | ICD-10-CM

## 2022-01-15 LAB — URINE CULTURE

## 2022-01-15 NOTE — Telephone Encounter (Signed)
Called no answer voicemail was full ?

## 2022-01-15 NOTE — Telephone Encounter (Signed)
FYI patient [place]. She will do at [place] when she returns on Monday.  ?

## 2022-01-16 ENCOUNTER — Other Ambulatory Visit: Payer: Self-pay | Admitting: Family

## 2022-01-16 ENCOUNTER — Telehealth: Payer: Self-pay

## 2022-01-16 DIAGNOSIS — N3000 Acute cystitis without hematuria: Secondary | ICD-10-CM

## 2022-01-16 MED ORDER — AMOXICILLIN-POT CLAVULANATE 875-125 MG PO TABS: 1.0000 | ORAL_TABLET | Freq: Two times a day (BID) | ORAL | 0 refills | Status: AC

## 2022-01-16 NOTE — Telephone Encounter (Signed)
Called no answer and voicemail is full ?

## 2022-01-16 NOTE — Telephone Encounter (Signed)
Urine culture returned ?Pt doesn't need to repeat ?Please let her know ?

## 2022-01-19 ENCOUNTER — Encounter: Payer: Self-pay | Admitting: Family

## 2022-01-19 NOTE — Telephone Encounter (Signed)
Just FYI patient stated that someone had called her in cephalexin or ciprofloxacin she was unsure what she was taking? She did look at lab online and was sensitive to antibiotic that she had. I did not resend the Augmentin & she knows not to pick up if [facility] did have. She did not believe that they did bc she had checked over the weekend. ?

## 2022-01-19 NOTE — Telephone Encounter (Signed)
Noted ?I sent her a mychart ?

## 2022-01-22 NOTE — Telephone Encounter (Signed)
Spoke to patient about not taking the Cephalexin  and pick up the Augmentin instead, but patient ended up taking the Cephalexin because she had not had a response from [place] yet.[name] stated to me that she had already spoke to the patient and informed her not to take the Cephalexin after I had spoken to her ?

## 2022-02-06 HISTORY — PX: CYSTOSCOPY WITH RETROGRADE PYELOGRAM, URETEROSCOPY AND STENT PLACEMENT: SHX5789

## 2022-02-12 ENCOUNTER — Encounter: Payer: Self-pay | Admitting: Family

## 2022-02-12 ENCOUNTER — Ambulatory Visit: Payer: Managed Care, Other (non HMO) | Admitting: Family

## 2022-02-12 VITALS — BP 123/84 | HR 83 | Temp 97.7°F | Ht 67.0 in | Wt 178.4 lb

## 2022-02-12 DIAGNOSIS — K743 Primary biliary cirrhosis: Secondary | ICD-10-CM

## 2022-02-12 DIAGNOSIS — R300 Dysuria: Secondary | ICD-10-CM

## 2022-02-12 LAB — POCT URINALYSIS DIPSTICK
Bilirubin, UA: NEGATIVE
Blood, UA: NEGATIVE
Glucose, UA: NEGATIVE
Nitrite, UA: POSITIVE
Protein, UA: NEGATIVE
Spec Grav, UA: 1.025 (ref 1.010–1.025)
Urobilinogen, UA: 1 E.U./dL
pH, UA: 6.5 (ref 5.0–8.0)

## 2022-02-12 NOTE — Progress Notes (Signed)
? ?Subjective:  ? ? Patient ID: [name], female    DOB: [birth year], 56 y.o.   MRN: [id number] ? ?CC: [name] is a 56 y.o. female who presents today for an acute visit.   ? ?HPI: She complains of dysuria, right sided low back pain x 1-2 days.  ? ?Urine also has foul odor.  ? ?She has chronic low back pain ? ?No fever, nausea, vomiting ? ?H/o renal stone.  ? ? ?She is currently not using Estrace ? ?Previous urinary tract infection 1 month ago showed E. coli.  Treated with keflex which was sent in by urology previously as preventative. She didn't take  augmentin.  ? ?she is following Dr. Dellia Nims, plastic surgeon whom saw her for abdominal swelling and assured her normal post operatively. ( Unable to see notes in epic) ? ? 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  Increased ursodiol. Due to anemia, Planning EGD, colonoscopy which Dr Dorris Fetch will arrange at [facility] per his note 01/29/22 ? ?She has follow-up scheduled with Dr. Darnell Level, bariatric surgeon; she plans to discuss iron deficiency with him.  She politely declines referral to hematology oncology at this time for discussion of IV iron  ? ?HISTORY:  ?Past Medical History:  ?Diagnosis Date  ? Asthma due to seasonal allergies   ? no inhaler  ? History of diabetes mellitus, type II   ? per pt resolved after bariatric surgery 2017  ? History of hypertension   ? per pt resolved after bariatric surgery 2017  ? History of kidney stones   ? History of obstructive sleep apnea   ? per pt hx osa w/ cpap,  retested after bariatric surgery 2017 result negative  ? History of skin cancer   ? nose, not melanoma  ? Hydronephrosis, right   ? Hyperlipidemia   ? MRSA carrier   ? NAFL (nonalcoholic fatty liver)   ? Osteoporosis   ? Primary biliary cirrhosis ([place])   ? Primary biliary cirrhosis ([place])   ? Renal calculi   ? per CT 04-18-2019 bilateral , non-obstructive  ? S/P bariatric surgery 08/03/2016  ? duodenal switch w/ hh repair  ?  Ureteral calculus, right   ? Wears glasses   ? ?Past Surgical History:  ?Procedure Laterality Date  ? AUGMENTATION MAMMAPLASTY    ? BARIATRIC SURGERY  08-03-2016    dr Darnell Level '@[facility] ,  [place]  ? LAPAROSCOPY DUODENAL SWITCH AND HIATAL HERNIA REPAIR  ? BRACHIOPLASTY    ? BREAST BIOPSY Right 2017  ? FIBROCYSTIC CHANGES WITH CALCIFICATIONS  ? CESAREAN SECTION  x2  last one 09-10-2000  ? COLONOSCOPY WITH PROPOFOL N/A 09/11/2019  ? Procedure: COLONOSCOPY WITH PROPOFOL;  Surgeon: Jonathon Bellows, MD;  Location[facility] ENDOSCOPY;  Service: Gastroenterology;  Laterality: N/A;  ? CYSTOSCOPY WITH RETROGRADE PYELOGRAM, URETEROSCOPY AND STENT PLACEMENT Right 02-07-2012  '@[facility]   ? CYSTOSCOPY WITH STENT PLACEMENT Right 09/10/2017  ? Procedure: CYSTOSCOPY WITH STENT PLACEMENT;  Surgeon: Abbie Sons, MD;  Location: [facility] ORS;  Service: Urology;  Laterality: Right;  ? CYSTOSCOPY/URETEROSCOPY/HOLMIUM LASER/STENT PLACEMENT Right 04/24/2019  ? Procedure: CYSTOSCOPY/URETEROSCOPY/STENT PLACEMENT/ RIGHT RETROGRADE;  Surgeon: Festus Aloe, MD;  Location: [facility];  Service: Urology;  Laterality: Right;  ? DX LAPAROSCOPY/  EGD/  HIATAL HERNIA REPAIR CLOSURE OF INTERNAL MENSENTRY DEFECT  10-28-2017   '@[facility] ,  [place]  ? EXTRACORPOREAL SHOCK WAVE LITHOTRIPSY N/A 01/30/2019  ? Procedure: EXTRACORPOREAL SHOCK WAVE LITHOTRIPSY (ESWL) LEFT/ POSSIBLE RIGHT;  Surgeon: Festus Aloe,  MD;  Location: [facility] ORS;  Service: Urology;  Laterality: N/A;  ? EXTRACORPOREAL SHOCK WAVE LITHOTRIPSY Right 03/20/2019  ? Procedure: EXTRACORPOREAL SHOCK WAVE LITHOTRIPSY (ESWL);  Surgeon: Franchot Gallo, MD;  Location: [facility] ORS;  Service: Urology;  Laterality: Right;  ? EXTRACORPOREAL SHOCK WAVE LITHOTRIPSY Left 10/30/2021  ? Procedure: EXTRACORPOREAL SHOCK WAVE LITHOTRIPSY (ESWL);  Surgeon: Ardis Hughs, MD;  Location: [facility];  Service: Urology;  Laterality: Left;  ? LAPAROSCOPIC CHOLECYSTECTOMY  10-15-2016   '@[facility]'$  ,   [place]  ? LIPOSUCTION    ? PANNICULECTOMY    ? PLACEMENT OF BREAST IMPLANTS Bilateral   ? TONSILLECTOMY AND ADENOIDECTOMY  child  ? URETEROSCOPY WITH HOLMIUM LASER LITHOTRIPSY Right 09/10/2017  ? Procedure: URETEROSCOPY WITH HOLMIUM LASER LITHOTRIPSY;  Surgeon: Abbie Sons, MD;  Location: [facility] ORS;  Service: Urology;  Laterality: Right;  ? ?Family History  ?Problem Relation Age of Onset  ? Alcohol abuse Mother   ? Hyperlipidemia Mother   ? Hypertension Mother   ? Lymphoma Mother   ?     primary site : brain  ? Leukemia Mother   ? Hyperlipidemia Father   ? Hypertension Father   ? Parkinson's disease Father   ? Drug abuse Sister   ? Cancer Maternal Grandmother   ?     breast cancer  ? Breast cancer Maternal Grandmother 60  ? Liver cancer Paternal Grandfather   ? Kidney cancer Neg Hx   ? Prostate cancer Neg Hx   ? ? ?Allergies: Ivp dye [iodinated contrast media], Latex, Tolmetin, Gallium nitrate, Insulin detemir, Nsaids, and Gadolinium derivatives ?Current Outpatient Medications on File Prior to Visit  ?Medication Sig Dispense Refill  ? CALCIUM PO Take by mouth.    ? Cholecalciferol (VITAMIN D3 PO) Take by mouth daily.    ? estradiol (ESTRACE) 0.1 MG/GM vaginal cream Place 3.50 Applicatorfuls vaginally at bedtime. 90 g 0  ? Multiple Vitamins-Minerals (BARIATRIC MULTIVITAMINS/IRON PO) Take by mouth.    ? mupirocin ointment (BACTROBAN) 2 % Apply 1 application topically 2 (two) times daily. X 5 days nose 30 g 0  ? PREVIDENT 5000 BOOSTER PLUS 1.1 % PSTE Place onto teeth as directed.    ? ursodiol (ACTIGALL) 500 MG tablet Take 250 mg by mouth in the morning and at bedtime. Take two tablets by mouth in the morning & three tablets by mouth in the evening.    ? VITAMIN A PO Take by mouth daily.    ? ?No current facility-administered medications on file prior to visit.  ? ? ?Social History  ? ?Tobacco Use  ? Smoking status: Never  ? Smokeless tobacco: Never  ?Vaping Use  ? Vaping Use: Never used  ?Substance Use Topics  ?  Alcohol use: No  ?  Alcohol/week: 0.0 standard drinks  ? Drug use: Never  ? ? ?Review of Systems  ?Constitutional:  Negative for chills and fever.  ?Respiratory:  Negative for cough.   ?Cardiovascular:  Negative for chest pain and palpitations.  ?Gastrointestinal:  Negative for nausea and vomiting.  ?Genitourinary:  Positive for dysuria and flank pain (right).  ?   ?Objective:  ?  ?BP 123/84 (BP Location: Left Arm, Patient Position: Sitting, Cuff Size: Normal)   Pulse 83   Temp 97.7 ?F (36.5 ?C) (Temporal)   Ht '5\' 7"'$  (1.702 m)   Wt 178 lb 6.4 oz (80.9 kg)   LMP 07/11/2016 (Exact Date)   SpO2 99%   BMI 27.94 kg/m?  ? ? ?Physical  Exam ?Vitals reviewed.  ?Constitutional:   ?   Appearance: She is well-developed.  ?Cardiovascular:  ?   Rate and Rhythm: Normal rate and regular rhythm.  ?   Pulses: Normal pulses.  ?   Heart sounds: Normal heart sounds.  ?Pulmonary:  ?   Effort: Pulmonary effort is normal.  ?   Breath sounds: Normal breath sounds. No wheezing, rhonchi or rales.  ?Skin: ?   General: Skin is warm and dry.  ?Neurological:  ?   Mental Status: She is alert.  ?Psychiatric:     ?   Speech: Speech normal.     ?   Behavior: Behavior normal.     ?   Thought Content: Thought content normal.  ? ? ?   ?Assessment & Plan:  ? ?Problem List Items Addressed This Visit   ? ?  ? Digestive  ? Primary biliary cirrhosis ([place])  ?  She has had follow-up Dr. Dorris Fetch. Compliant with ursodiol. Will follow.  ?  ?  ?  ? Other  ? Dysuria - Primary  ?  Point-of-care urine positive for nitrites.  Negative RBCs, WBCs.   presentation not consistent with renal stone at this time however we will remain  vigilant for any progression of back pain certainly develop fever nausea.  patient and I jointly agreed with await urine culture.  Advised her to resume and be more consistent with vaginal estrogen due to recurrent nature of UTIs.  She will also maintain follow-up with urology. ?  ?  ? Relevant Orders  ? Urine Culture  ? POCT Urinalysis  Dipstick (Completed)  ? Urinalysis, Routine w reflex microscopic  ? ? ? ? ?I am having Raeanne D. Cromley maintain her ursodiol, PreviDent 5000 Booster Plus, Multiple Vitamins-Minerals (BARIATRIC MULTIVITAMINS/IRON PO),

## 2022-02-12 NOTE — Telephone Encounter (Signed)
LMTCB to office to try and schedule appointment today for possible UTI ?

## 2022-02-12 NOTE — Assessment & Plan Note (Addendum)
Point-of-care urine positive for nitrites.  Negative RBCs, WBCs.   presentation not consistent with renal stone at this time however we will remain  vigilant for any progression of back pain certainly develop fever nausea.  patient and I jointly agreed with await urine culture.  Advised her to resume and be more consistent with vaginal estrogen due to recurrent nature of UTIs.  She will also maintain follow-up with urology. ?

## 2022-02-12 NOTE — Assessment & Plan Note (Signed)
She has had follow-up Dr. Dorris Fetch. Compliant with ursodiol. Will follow.  ?

## 2022-02-13 ENCOUNTER — Encounter: Payer: Self-pay | Admitting: Family

## 2022-02-13 LAB — URINALYSIS, ROUTINE W REFLEX MICROSCOPIC
Bilirubin, UA: NEGATIVE
Glucose, UA: NEGATIVE
Nitrite, UA: POSITIVE — AB
Protein,UA: NEGATIVE
RBC, UA: NEGATIVE
Specific Gravity, UA: 1.019 (ref 1.005–1.030)
Urobilinogen, Ur: 0.2 mg/dL (ref 0.2–1.0)
pH, UA: 6.5 (ref 5.0–7.5)

## 2022-02-13 LAB — MICROSCOPIC EXAMINATION
Casts: NONE SEEN /lpf
RBC, Urine: NONE SEEN /hpf (ref 0–2)

## 2022-02-16 ENCOUNTER — Other Ambulatory Visit: Payer: Self-pay

## 2022-02-16 LAB — URINE CULTURE

## 2022-02-16 MED ORDER — AMOXICILLIN-POT CLAVULANATE 500-125 MG PO TABS
1.0000 | ORAL_TABLET | Freq: Two times a day (BID) | ORAL | 0 refills | Status: AC
Start: 2022-02-16 — End: 2022-02-23

## 2022-02-16 NOTE — Progress Notes (Signed)
Augmentin 500 mg-'125mg'$  one tablet by mouth two times daily for 7 days ?

## 2022-03-02 ENCOUNTER — Other Ambulatory Visit: Payer: Self-pay

## 2022-03-02 MED ORDER — NITROFURANTOIN MONOHYD MACRO 100 MG PO CAPS: 100.0000 mg | ORAL_CAPSULE | Freq: Two times a day (BID) | ORAL | 0 refills | Status: DC

## 2022-03-30 ENCOUNTER — Encounter: Payer: Self-pay | Admitting: Family

## 2022-04-01 ENCOUNTER — Other Ambulatory Visit: Payer: Self-pay

## 2022-04-01 DIAGNOSIS — D649 Anemia, unspecified: Secondary | ICD-10-CM

## 2022-04-03 ENCOUNTER — Other Ambulatory Visit: Payer: Self-pay | Admitting: Family

## 2022-04-04 LAB — CBC WITH DIFFERENTIAL/PLATELET
Basophils Absolute: 0 10*3/uL (ref 0.0–0.2)
Basos: 1 %
EOS (ABSOLUTE): 0.4 10*3/uL (ref 0.0–0.4)
Eos: 6 %
Hematocrit: 34.8 % (ref 34.0–46.6)
Hemoglobin: 10.4 g/dL — ABNORMAL LOW (ref 11.1–15.9)
Immature Grans (Abs): 0 10*3/uL (ref 0.0–0.1)
Immature Granulocytes: 0 %
Lymphocytes Absolute: 2 10*3/uL (ref 0.7–3.1)
Lymphs: 34 %
MCH: 24 pg — ABNORMAL LOW (ref 26.6–33.0)
MCHC: 29.9 g/dL — ABNORMAL LOW (ref 31.5–35.7)
MCV: 80 fL (ref 79–97)
Monocytes Absolute: 0.5 10*3/uL (ref 0.1–0.9)
Monocytes: 8 %
Neutrophils Absolute: 3 10*3/uL (ref 1.4–7.0)
Neutrophils: 51 %
Platelets: 242 10*3/uL (ref 150–450)
RBC: 4.33 x10E6/uL (ref 3.77–5.28)
RDW: 22.4 % — ABNORMAL HIGH (ref 11.7–15.4)
WBC: 5.9 10*3/uL (ref 3.4–10.8)

## 2022-04-04 LAB — COMPREHENSIVE METABOLIC PANEL
ALT: 30 IU/L (ref 0–32)
AST: 28 IU/L (ref 0–40)
Albumin/Globulin Ratio: 1.4 (ref 1.2–2.2)
Albumin: 3.8 g/dL (ref 3.8–4.9)
Alkaline Phosphatase: 203 IU/L — ABNORMAL HIGH (ref 44–121)
BUN/Creatinine Ratio: 17 (ref 9–23)
BUN: 12 mg/dL (ref 6–24)
Bilirubin Total: <0.2 mg/dL (ref 0.0–1.2)
CO2: 23 mmol/L (ref 20–29)
Calcium: 8.8 mg/dL (ref 8.7–10.2)
Chloride: 109 mmol/L — ABNORMAL HIGH (ref 96–106)
Creatinine, Ser: 0.71 mg/dL (ref 0.57–1.00)
Globulin, Total: 2.8 g/dL (ref 1.5–4.5)
Glucose: 109 mg/dL — ABNORMAL HIGH (ref 70–99)
Potassium: 4.2 mmol/L (ref 3.5–5.2)
Sodium: 146 mmol/L — ABNORMAL HIGH (ref 134–144)
Total Protein: 6.6 g/dL (ref 6.0–8.5)
eGFR: 100 mL/min/{1.73_m2} (ref >59)

## 2022-04-04 LAB — IRON AND TIBC
Iron Saturation: 7 % — CL (ref 15–55)
Iron: 27 ug/dL (ref 27–159)
Total Iron Binding Capacity: 401 ug/dL (ref 250–450)
UIBC: 374 ug/dL (ref 131–425)

## 2022-04-04 LAB — FERRITIN: Ferritin: 17 ng/mL (ref 15–150)

## 2022-04-04 LAB — SEDIMENTATION RATE: Sed Rate: 31 mm/hr (ref 0–40)

## 2022-04-13 ENCOUNTER — Telehealth: Payer: Self-pay

## 2022-04-13 NOTE — Telephone Encounter (Signed)
LMTCB to go over results

## 2022-04-13 NOTE — Progress Notes (Signed)
LMTCB to go over results

## 2022-04-15 ENCOUNTER — Telehealth: Payer: Self-pay

## 2022-04-15 NOTE — Progress Notes (Signed)
Patient has seen [employer].... her results.Written by [name], FNP on 04/12/2022  7:14 AM EDT View Full Comments Seen by patient [name] on 04/12/2022  7:17 AM

## 2022-04-15 NOTE — Telephone Encounter (Signed)
Was unable to reach patient but she has viewed the results via mychart...Marland KitchenMarland KitchenWritten by [name], FNP on 04/12/2022  7:14 AM EDT  Seen by patient [name] on 04/12/2022  7:17 AM

## 2022-06-08 ENCOUNTER — Ambulatory Visit
Admission: RE | Admit: 2022-06-08 | Discharge: 2022-06-08 | Disposition: A | Discharge status: Home / Self Care | Payer: Managed Care, Other (non HMO) | Source: Ambulatory Visit | Attending: Family | Admitting: Family

## 2022-06-08 ENCOUNTER — Other Ambulatory Visit: Payer: Self-pay | Admitting: Family

## 2022-06-08 DIAGNOSIS — Z1231 Encounter for screening mammogram for malignant neoplasm of breast: Secondary | ICD-10-CM | POA: Diagnosis not present

## 2022-06-29 ENCOUNTER — Encounter: Payer: Self-pay | Admitting: Internal Medicine

## 2022-06-29 ENCOUNTER — Ambulatory Visit: Payer: Managed Care, Other (non HMO) | Admitting: Internal Medicine

## 2022-06-29 VITALS — BP 126/80 | HR 58 | Ht 67.0 in | Wt 169.4 lb

## 2022-06-29 DIAGNOSIS — R001 Bradycardia, unspecified: Secondary | ICD-10-CM

## 2022-06-29 DIAGNOSIS — E042 Nontoxic multinodular goiter: Secondary | ICD-10-CM | POA: Diagnosis not present

## 2022-06-29 DIAGNOSIS — M810 Age-related osteoporosis without current pathological fracture: Secondary | ICD-10-CM

## 2022-06-29 NOTE — Patient Instructions (Signed)
Check out Evenity , this is once a month injection for 12 months  for osteoporosis  Check out Zoledronic acid, this is once a year injections for osteoporosis

## 2022-06-29 NOTE — Progress Notes (Unsigned)
 Name: [name]  MRN/ DOB: [id number], 08/15/1966    Age/ Sex: 55 y.o., female     PCP: [name], Margaret G, FNP   Reason for Endocrinology Evaluation: Thyroid nodule /osteoporosis     Initial Endocrinology Clinic Visit: 06/26/2019    PATIENT IDENTIFIER: [name] is a 55 y.o., female with a past medical history of HTN,T2DM and Hx of gastric bypass and primary biliary cholangitis . She has followed with [facility] since 06/26/2019 for consultative assistance with management of her thyroid nodules .   HISTORICAL SUMMARY:  Was diagnosed with MNG in 2006. Per pt she had drainage of a thyroid cyst with benign cytology. In 2020 she presented to her PCP with c/o worsening neck enlargement, associated with dysphagia,and discomfort. An ultrasound showed a left thyroid nodule that met criteria for FNA. The nodule was smaller then it has been years ago and the pt opted not to proceed with FNA .     Osteoporosis History: She is S/P Gastric Bypass sx in  07/2016 , Pre-surgery weight was 333 Lbs, lowest weight since sx 150 lbs. She was on 2 bariatric vitamins up to a year ago and was stopped due to recurrent renal stones, stopping the vitamins did not help and had ~ 17 renal stones in the past year. Pt had a DXA 04/14/2019 with a T-Score of -3.0 which prompted her to start taking her vitamins .  Pt opted to avoid all anti-resorptive medications due to fear of side effects    SUBJECTIVE:     Today (06/29/2022):  [name] is here for a follow up on  MNG.  She continues to follow up at [facility] for primary biliary cholangitis. On ursodiol  Alk. Phos fluctuating , this has been attributed to bone loss, she continues to decline anti-resorptive therapy due to hypocalcemia   Weight has decreased   Denies any local neck enlargement Denies constipation  Denies tremors  She had an episode of palpitations last night with syncope x 2  Denies nausea  Denies  chest pain  Denies seizures    Bariatric advantage 2 tabs daily  ( each tab contains 85 mg of calcium and 1500 iu of vitamin D3)  4 of the Raw calcium ( 1600 iu vitamin D and 1100 mg of calcium)  Vitamin D 2000 iu  Vitamin A 10,000    HISTORY:  Past Medical History:  Past Medical History:  Diagnosis Date   Asthma due to seasonal allergies    no inhaler   History of diabetes mellitus, type II    per pt resolved after bariatric surgery 2017   History of hypertension    per pt resolved after bariatric surgery 2017   History of kidney stones    History of obstructive sleep apnea    per pt hx osa w/ cpap,  retested after bariatric surgery 2017 result negative   History of skin cancer    nose, not melanoma   Hydronephrosis, right    Hyperlipidemia    MRSA carrier    NAFL (nonalcoholic fatty liver)    Osteoporosis    Primary biliary cirrhosis (HCC)    Primary biliary cirrhosis (HCC)    Renal calculi    per CT 04-18-2019 bilateral , non-obstructive   S/P bariatric surgery 08/03/2016   duodenal switch w/ hh repair   Ureteral calculus, right    Wears glasses    Past Surgical History:  Past Surgical History:  Procedure Laterality Date     AUGMENTATION MAMMAPLASTY Bilateral 07/2021   Implants and lift   BARIATRIC SURGERY  08-03-2016    dr Darnell Level _0 ,  [place]   LAPAROSCOPY DUODENAL SWITCH AND HIATAL HERNIA REPAIR   BRACHIOPLASTY     BREAST BIOPSY Right 2017   FIBROCYSTIC CHANGES WITH CALCIFICATIONS   CESAREAN SECTION  x2  last one 09-10-2000   COLONOSCOPY WITH PROPOFOL N/A 09/11/2019   Procedure: COLONOSCOPY WITH PROPOFOL;  Surgeon: Jonathon Bellows, MD;  Location: [facility] ENDOSCOPY;  Service: Gastroenterology;  Laterality: N/A;   CYSTOSCOPY WITH RETROGRADE PYELOGRAM, URETEROSCOPY AND STENT PLACEMENT Right 02-07-2012  _1    CYSTOSCOPY WITH STENT PLACEMENT Right 09/10/2017   Procedure: CYSTOSCOPY WITH STENT PLACEMENT;  Surgeon: Abbie Sons, MD;  Location: [facility] ORS;  Service:  Urology;  Laterality: Right;   CYSTOSCOPY/URETEROSCOPY/HOLMIUM LASER/STENT PLACEMENT Right 04/24/2019   Procedure: CYSTOSCOPY/URETEROSCOPY/STENT PLACEMENT/ RIGHT RETROGRADE;  Surgeon: Festus Aloe, MD;  Location: [facility];  Service: Urology;  Laterality: Right;   DX LAPAROSCOPY/  EGD/  HIATAL HERNIA REPAIR CLOSURE OF INTERNAL MENSENTRY DEFECT  10-28-2017   _2 ,  [place]   EXTRACORPOREAL SHOCK WAVE LITHOTRIPSY N/A 01/30/2019   Procedure: EXTRACORPOREAL SHOCK WAVE LITHOTRIPSY (ESWL) LEFT/ POSSIBLE RIGHT;  Surgeon: Festus Aloe, MD;  Location: [facility];  Service: Urology;  Laterality: N/A;   EXTRACORPOREAL SHOCK WAVE LITHOTRIPSY Right 03/20/2019   Procedure: EXTRACORPOREAL SHOCK WAVE LITHOTRIPSY (ESWL);  Surgeon: Franchot Gallo, MD;  Location: [facility];  Service: Urology;  Laterality: Right;   EXTRACORPOREAL SHOCK WAVE LITHOTRIPSY Left 10/30/2021   Procedure: EXTRACORPOREAL SHOCK WAVE LITHOTRIPSY (ESWL);  Surgeon: Ardis Hughs, MD;  Location[facility];  Service: Urology;  Laterality: Left;   LAPAROSCOPIC CHOLECYSTECTOMY  10-15-2016   _3  ,  [place]   LIPOSUCTION     PANNICULECTOMY     PLACEMENT OF BREAST IMPLANTS Bilateral    TONSILLECTOMY AND ADENOIDECTOMY  child   URETEROSCOPY WITH HOLMIUM LASER LITHOTRIPSY Right 09/10/2017   Procedure: URETEROSCOPY WITH HOLMIUM LASER LITHOTRIPSY;  Surgeon: Abbie Sons, MD;  Location: [facility] ORS;  Service: Urology;  Laterality: Right;   Social History:  reports that she has never smoked. She has never used smokeless tobacco. She reports that she does not drink alcohol and does not use drugs. Family History:  Family History  Problem Relation Age of Onset   Alcohol abuse Mother    Hyperlipidemia Mother    Hypertension Mother    Lymphoma Mother        primary site : brain   Leukemia Mother    Hyperlipidemia Father    Hypertension Father    Parkinson's disease Father    Drug abuse Sister    Cancer  Maternal Grandmother        breast cancer   Breast cancer Maternal Grandmother 22   Liver cancer Paternal Grandfather    Kidney cancer Neg Hx    Prostate cancer Neg Hx      HOME MEDICATIONS: Allergies as of 06/29/2022       Reactions   Ivp Dye [iodinated Contrast Viola    Patient 'codes' with IV contrast   Latex Shortness Of Breath, Cough   Tolmetin Hives   Gallium Nitrate Hives   AND Gadolinium Derivatives   Insulin Detemir Hives, Swelling   Lump at injection site   Nsaids Hives   Per pt stated has to take ibuprofen with a antihistimine   Gadolinium Derivatives Hives   Pt c/o itching after contrast injection 03/15/19 @ 8:45am. [facility] Per Dr. Kathlene Cote document this  as allergic reaction.         Medication List        Accurate as of June 29, 2022 10:47 AM. If you have any questions, ask your nurse or doctor.          STOP taking these medications    nitrofurantoin (macrocrystal-monohydrate) 100 MG capsule Commonly known as: Macrobid Stopped by: Ibtehal J Shamleffer, MD       TAKE these medications    BARIATRIC MULTIVITAMINS/IRON PO Take by mouth.   CALCIUM PO Take by mouth.   estradiol 0.1 MG/GM vaginal cream Commonly known as: ESTRACE Place 0.25 Applicatorfuls vaginally at bedtime.   mupirocin ointment 2 % Commonly known as: BACTROBAN Apply 1 application topically 2 (two) times daily. X 5 days nose   PreviDent 5000 Booster Plus 1.1 % Pste Generic drug: Sodium Fluoride Place onto teeth as directed.   ursodiol 500 MG tablet Commonly known as: ACTIGALL Take 250 mg by mouth in the morning and at bedtime. Take two tablets by mouth in the morning & three tablets by mouth in the evening.   VITAMIN A PO Take by mouth daily.   VITAMIN D3 PO Take by mouth daily.          OBJECTIVE:   PHYSICAL EXAM: VS: BP 126/80 (BP Location: Left Arm, Patient Position: Sitting, Cuff Size: Small)   Pulse (!) 58   Ht 5' 7" (1.702 m)   Wt 169 lb 6.4 oz (76.8  kg)   LMP 07/11/2016 (Exact Date)   SpO2 97%   BMI 26.53 kg/m    EXAM: General: Pt appears well and is in NAD  Neck: General: Supple without adenopathy. Thyroid: Thyroid size normal. Left  nodule appreciated.   Lungs: Clear with good BS bilat with no rales, rhonchi, or wheezes  Heart: Auscultation: RRR.  Abdomen: Normoactive bowel sounds, soft, nontender, without masses or organomegaly palpable  Extremities:  BL LE: No pretibial edema normal ROM and strength.  Mental Status: Judgment, insight: Intact Orientation: Oriented to time, place, and person Mood and affect: No depression, anxiety, or agitation     DATA REVIEWED:  Latest Reference Range & Units 04/03/22 08:08  COMPREHENSIVE METABOLIC PANEL  Rpt !  Sodium 134 - 144 mmol/L 146 (H)  Potassium 3.5 - 5.2 mmol/L 4.2  Chloride 96 - 106 mmol/L 109 (H)  CO2 20 - 29 mmol/L 23  Glucose 70 - 99 mg/dL 109 (H)  BUN 6 - 24 mg/dL 12  Creatinine 0.57 - 1.00 mg/dL 0.71  Calcium 8.7 - 10.2 mg/dL 8.8  BUN/Creatinine Ratio 9 - 23  17  eGFR >59 mL/min/1.73 100  Alkaline Phosphatase 44 - 121 IU/L 203 (H)  Albumin 3.8 - 4.9 g/dL 3.8  Albumin/Globulin Ratio 1.2 - 2.2  1.4  AST 0 - 40 IU/L 28  ALT 0 - 32 IU/L 30  Total Protein 6.0 - 8.5 g/dL 6.6  Total Bilirubin 0.0 - 1.2 mg/dL <0.2     Thyroid Ultrasound 07/22/2022 Estimated total number of nodules >/= 1 cm: 2   Number of spongiform nodules >/=  2 cm not described below (TR1): 0   Number of mixed cystic and solid nodules >/= 1.5 cm not described below (TR2): 0   _________________________________________________________   Nodule # 1:   Prior biopsy: No   Location: Right; Inferior   Maximum size: 1.1 cm; Other 2 dimensions: 0.9 x 0.6 cm, previously, 1.1 x 0.9 x 0.7 cm   Composition: solid/almost completely solid (2)     Echogenicity: hypoechoic (2)   Shape: not taller-than-wide (0)   Margins: ill-defined (0)   Echogenic foci: none (0)   ACR TI-RADS total points:  4.   ACR TI-RADS risk category:  TR4 (4-6 points).   Significant change in size (>/= 20% in two dimensions and minimal increase of 2 mm): No   Change in features: No   Change in ACR TI-RADS risk category: No   ACR TI-RADS recommendations:   *Given size (>/= 1 - 1.4 cm) and appearance, a follow-up ultrasound in 1 year should be considered based on TI-RADS criteria.   _________________________________________________________   Nodule # 3: Previously biopsied nodule in the left inferior gland remains stable at 2.9 x 1.7 x 1.7 cm.   No new nodules or suspicious features.   IMPRESSION: 1. Continued stability of previously biopsied nodule in the left inferior gland. 2. Confirmed 2 year stability of right lower pole TI-RADS category 4 nodule. Recommend continued ultrasound surveillance with annual ultrasound follow-up until 5 year stability has been confirmed. 3. No new nodules or suspicious features.     FNA 09/18/2020 Clinical History: Left; Inferior 2.7 cm;  Other 2 dimensions: 1.9 x 2  cm; TI-RADS - 4  Specimen Submitted:  A. THYROID GLAND, LEFT, LLP, FINE NEEDLE  ASPIRATION:    FINAL MICROSCOPIC DIAGNOSIS:  - Consistent with benign follicular nodule ([place] category II)      ASSESSMENT / PLAN / RECOMMENDATIONS:   Multinodular Goiter:    - Pt is clinically euthyroid  - No local neck symptoms  - FNA of the left inferior nodule was benign 07/25/2020 - I have ordered a thyroid ultrasound for stability   -Patient will have labs done at [facility]   2. Osteoporosis :  - Historically she has declined proia due to fear of side effects  - She has primary biliary cholangitis and was recently noted with elevated Alk. Phos that was mainly attributed to bone loss  - Not a candidate for oral bisphosphonate's  - We discussed alternative therapy to prolia such as Zolidronic acid and evenity     3. Syncope:  - She has two episodes of syncope yesterday that she  attributes to heat as this happened after mowing the lawn, she is asymptomatic at this time but noted with bradycardia  - Pt advised to follow up with PCP ASAP or urgent care as this is beyond the scope of endocrinology   F/U in 1 yr     Signed electronically by: Mack Guise, MD  [facility]  [facility] [address] [place], [place] [address] Phone: [phone] FAX: [phone]      CC: [name], FNP [address] [place] [address] Phone: [phone]  Fax: [phone]   Return to Endocrinology clinic as below: Future Appointments  Date Time Provider [place]  06/23/2023  9:10 AM Asna Muldrow, Melanie Crazier, MD [facility] None

## 2022-07-05 HISTORY — PX: AUGMENTATION MAMMAPLASTY: SUR837

## 2022-07-07 LAB — TSH: TSH: 1.6 u[IU]/mL (ref 0.450–4.500)

## 2022-07-07 LAB — T4, FREE: Free T4: 1 ng/dL (ref 0.82–1.77)

## 2022-07-19 ENCOUNTER — Other Ambulatory Visit: Payer: Self-pay | Admitting: Obstetrics and Gynecology

## 2022-07-19 DIAGNOSIS — N952 Postmenopausal atrophic vaginitis: Secondary | ICD-10-CM

## 2022-07-19 DIAGNOSIS — N9419 Other specified dyspareunia: Secondary | ICD-10-CM

## 2022-09-18 ENCOUNTER — Encounter: Payer: Self-pay | Admitting: Family

## 2022-09-18 ENCOUNTER — Ambulatory Visit: Payer: Managed Care, Other (non HMO) | Admitting: Family

## 2022-09-18 VITALS — BP 138/88 | HR 81 | Temp 98.4°F | Ht 68.0 in | Wt 160.8 lb

## 2022-09-18 DIAGNOSIS — M810 Age-related osteoporosis without current pathological fracture: Secondary | ICD-10-CM

## 2022-09-18 DIAGNOSIS — K469 Unspecified abdominal hernia without obstruction or gangrene: Secondary | ICD-10-CM

## 2022-09-18 DIAGNOSIS — Z23 Encounter for immunization: Secondary | ICD-10-CM | POA: Diagnosis not present

## 2022-09-18 DIAGNOSIS — Z113 Encounter for screening for infections with a predominantly sexual mode of transmission: Secondary | ICD-10-CM

## 2022-09-18 DIAGNOSIS — Z124 Encounter for screening for malignant neoplasm of cervix: Secondary | ICD-10-CM | POA: Diagnosis not present

## 2022-09-18 NOTE — Assessment & Plan Note (Addendum)
Presentation consistent with abdominal hernia.  Pending CT abdomen and pelvis for further evaluation.  I have ordered without contrast due to patient's IV contrast allergy.

## 2022-09-18 NOTE — Assessment & Plan Note (Signed)
Fortunately asymptomatic.  Obtain Pap smear, vaginal swab and also screened for serum STDs.

## 2022-09-18 NOTE — Patient Instructions (Addendum)
Please call  and schedule your 3D mammogram and /or bone density scan as we discussed.   [facility]  ( new location in 2023)  [address], [place], [place] [address]  [place], [place]  I have ordered CT of the pelvis to evaluate for suspected hernia.  Let us know if you dont hear back within a week in regards to an appointment being scheduled.   So that you are aware, if you are [facility] MyChart user , please pay attention to your MyChart messages as you may receive a MyChart message with a phone number to call and schedule this test/appointment own your own from our referral coordinator. This is a new process so I do not want you to miss this message.  If you are not a MyChart user, you will receive a phone call.    Nice to see you

## 2022-09-18 NOTE — Assessment & Plan Note (Signed)
History of chronic hypocalcemia.  She is following with endocrine.  Due for bone density and ordered today.  Patient will schedule

## 2022-09-18 NOTE — Progress Notes (Signed)
Subjective:    Patient ID: [name], female    DOB: [birth year], 56 y.o.   MRN: [id number]  CC: [name] is a 56 y.o. female who presents today for an acute visit.    HPI: She has been weightlifting and her trainer is noticing a bulge in the epigastric region.  Not painful. Describes a hardened area close to surgical incision.   History of  abdominoplasty, breast reconstruction  She has recently become sexually active.  No vaginal pain, unusual vaginal discharge.  She would like to be screened for STDs.     Pap smear obtained 06/30/2021, negative malignancy, HPV  Following with Dr Kelton Pillar for osteoporosis  She is highly allergic to IV contrast  HISTORY:  Past Medical History:  Diagnosis Date   Asthma due to seasonal allergies    no inhaler   History of diabetes mellitus, type II    per pt resolved after bariatric surgery 2017   History of hypertension    per pt resolved after bariatric surgery 2017   History of kidney stones    History of obstructive sleep apnea    per pt hx osa w/ cpap,  retested after bariatric surgery 2017 result negative   History of skin cancer    nose, not melanoma   Hydronephrosis, right    Hyperlipidemia    MRSA carrier    NAFL (nonalcoholic fatty liver)    Osteoporosis    Primary biliary cirrhosis ([place])    Primary biliary cirrhosis ([place])    Renal calculi    per CT 04-18-2019 bilateral , non-obstructive   S/P bariatric surgery 08/03/2016   duodenal switch w/ hh repair   Ureteral calculus, right    Wears glasses    Past Surgical History:  Procedure Laterality Date   AUGMENTATION MAMMAPLASTY Bilateral 07/2021   Implants and lift   AUGMENTATION MAMMAPLASTY Bilateral 07/05/2022   Dr Ysidro Evert, replaced implants   BARIATRIC SURGERY  08-03-2016    dr Darnell Level '@[facility] ,  [place]   LAPAROSCOPY DUODENAL SWITCH AND HIATAL HERNIA REPAIR   BRACHIOPLASTY     BREAST BIOPSY Right 2017   FIBROCYSTIC CHANGES WITH CALCIFICATIONS    CESAREAN SECTION  x2  last one 09-10-2000   COLONOSCOPY WITH PROPOFOL N/A 09/11/2019   Procedure: COLONOSCOPY WITH PROPOFOL;  Surgeon: Jonathon Bellows, MD;  Location: [facility] ENDOSCOPY;  Service: Gastroenterology;  Laterality: N/A;   CYSTOSCOPY WITH RETROGRADE PYELOGRAM, URETEROSCOPY AND STENT PLACEMENT Right 02-07-2012  '@[facility]'$    CYSTOSCOPY WITH STENT PLACEMENT Right 09/10/2017   Procedure: CYSTOSCOPY WITH STENT PLACEMENT;  Surgeon: Abbie Sons, MD;  Location: [facility] ORS;  Service: Urology;  Laterality: Right;   CYSTOSCOPY/URETEROSCOPY/HOLMIUM LASER/STENT PLACEMENT Right 04/24/2019   Procedure: CYSTOSCOPY/URETEROSCOPY/STENT PLACEMENT/ RIGHT RETROGRADE;  Surgeon: Festus Aloe, MD;  Location: [facility];  Service: Urology;  Laterality: Right;   DX LAPAROSCOPY/  EGD/  HIATAL HERNIA REPAIR CLOSURE OF INTERNAL MENSENTRY DEFECT  10-28-2017   '@[facility]'$ ,  [place]   EXTRACORPOREAL SHOCK WAVE LITHOTRIPSY N/A 01/30/2019   Procedure: EXTRACORPOREAL SHOCK WAVE LITHOTRIPSY (ESWL) LEFT/ POSSIBLE RIGHT;  Surgeon: Festus Aloe, MD;  Location: [facility];  Service: Urology;  Laterality: N/A;   EXTRACORPOREAL SHOCK WAVE LITHOTRIPSY Right 03/20/2019   Procedure: EXTRACORPOREAL SHOCK WAVE LITHOTRIPSY (ESWL);  Surgeon: Franchot Gallo, MD;  Location: [facility];  Service: Urology;  Laterality: Right;   EXTRACORPOREAL SHOCK WAVE LITHOTRIPSY Left 10/30/2021   Procedure: EXTRACORPOREAL SHOCK WAVE LITHOTRIPSY (ESWL);  Surgeon: Ardis Hughs, MD;  Location: [facility];  Service: Urology;  Laterality: Left;   LAPAROSCOPIC CHOLECYSTECTOMY  10-15-2016   '@[facility]'$  ,  [place]   LIPOSUCTION     PANNICULECTOMY     PLACEMENT OF BREAST IMPLANTS Bilateral    TONSILLECTOMY AND ADENOIDECTOMY  child   URETEROSCOPY WITH HOLMIUM LASER LITHOTRIPSY Right 09/10/2017   Procedure: URETEROSCOPY WITH HOLMIUM LASER LITHOTRIPSY;  Surgeon: Abbie Sons, MD;  Location: [facility];  Service: Urology;  Laterality:  Right;   Family History  Problem Relation Age of Onset   Alcohol abuse Mother    Hyperlipidemia Mother    Hypertension Mother    Lymphoma Mother        primary site : brain   Leukemia Mother    Hyperlipidemia Father    Hypertension Father    Parkinson's disease Father    Drug abuse Sister    Cancer Maternal Grandmother        breast cancer   Breast cancer Maternal Grandmother 55   Liver cancer Paternal Grandfather    Kidney cancer Neg Hx    Prostate cancer Neg Hx     Allergies: Ivp dye [iodinated contrast media], Latex, Tolmetin, Gallium nitrate, Insulin detemir, Nsaids, and Gadolinium derivatives Current Outpatient Medications on File Prior to Visit  Medication Sig Dispense Refill   CALCIUM PO Take by mouth.     Cholecalciferol (VITAMIN D3 PO) Take by mouth daily.     estradiol (ESTRACE) 0.1 MG/GM vaginal cream PLACE 1/4 APPLICATORFUL IN THE VAGINA AT BEDTIME 42.5 g 0   Multiple Vitamins-Minerals (BARIATRIC MULTIVITAMINS/IRON PO) Take by mouth.     mupirocin ointment (BACTROBAN) 2 % Apply 1 application topically 2 (two) times daily. X 5 days nose 30 g 0   PREVIDENT 5000 BOOSTER PLUS 1.1 % PSTE Place onto teeth as directed.     ursodiol (ACTIGALL) 500 MG tablet Take 250 mg by mouth in the morning and at bedtime. Take two tablets by mouth in the morning & three tablets by mouth in the evening.     VITAMIN A PO Take by mouth daily.     No current facility-administered medications on file prior to visit.    Social History   Tobacco Use   Smoking status: Never   Smokeless tobacco: Never  Vaping Use   Vaping Use: Never used  Substance Use Topics   Alcohol use: No    Alcohol/week: 0.0 standard drinks of alcohol   Drug use: Never    Review of Systems  Constitutional:  Negative for chills and fever.  Respiratory:  Negative for cough.   Cardiovascular:  Negative for chest pain and palpitations.  Gastrointestinal:  Negative for nausea and vomiting.  Genitourinary:   Negative for vaginal discharge.      Objective:    BP 138/88 (BP Location: Left Arm, Patient Position: Sitting, Cuff Size: Normal)   Pulse 81   Temp 98.4 F (36.9 C) (Oral)   Ht '5\' 8"'$  (1.727 m)   Wt 160 lb 12.8 oz (72.9 kg)   LMP 07/11/2016 (Exact Date)   SpO2 97%   BMI 24.45 kg/m    Physical Exam Vitals reviewed.  Constitutional:      Appearance: She is well-developed.  Eyes:     Conjunctiva/sclera: Conjunctivae normal.  Cardiovascular:     Rate and Rhythm: Normal rate and regular rhythm.     Pulses: Normal pulses.     Heart sounds: Normal heart sounds.  Pulmonary:     Effort: Pulmonary effort is normal.  Breath sounds: Normal breath sounds. No wheezing, rhonchi or rales.  Abdominal:       Comments: Approximately 3 to 4 cm hardened area.  More noticeable as patient was laying on exam table or coughs. Reducible.   Genitourinary:    Labia:        Right: No rash, tenderness or lesion.        Left: No rash, tenderness or lesion.      Vagina: No foreign body. No vaginal discharge, erythema, tenderness or bleeding.     Cervix: No cervical motion tenderness or discharge.     Uterus: Not deviated and not enlarged.      Adnexa:        Right: No mass, tenderness or fullness.         Left: No mass, tenderness or fullness.       Comments: No vulvovaginal erythema. No lesions. Discharge is thin and clear, not purulent.  Pap obtained. Skin:    General: Skin is warm and dry.  Neurological:     Mental Status: She is alert.  Psychiatric:        Speech: Speech normal.        Behavior: Behavior normal.        Thought Content: Thought content normal.        Assessment & Plan:   Problem List Items Addressed This Visit       Musculoskeletal and Integument   Osteoporosis    History of chronic hypocalcemia.  She is following with endocrine.  Due for bone density and ordered today.  Patient will schedule      Relevant Orders   DG Bone Density     Other   Abdominal  hernia    Presentation consistent with abdominal hernia.  Pending CT abdomen and pelvis for further evaluation.  I have ordered without contrast due to patient's IV contrast allergy.      Relevant Orders   CT Abdomen Pelvis Wo Contrast   Screen for STD (sexually transmitted disease) - Primary    Fortunately asymptomatic.  Obtain Pap smear, vaginal swab and also screened for serum STDs.      Relevant Orders   Cytology - non gyn   RPR   HIV Antibody (routine testing w rflx)   Acute Hep Panel & Hep B Surface Ab   Other Visit Diagnoses     Screening for cervical cancer       Relevant Orders   IGP, Aptima HPV   Pap LB (liquid-based)   Need for shingles vaccine       Relevant Orders   Zoster Recombinant (Shingrix ) (Completed)         I am having Hanaa D. Cottman maintain her ursodiol, PreviDent 5000 Booster Plus, Multiple Vitamins-Minerals (BARIATRIC MULTIVITAMINS/IRON PO), CALCIUM PO, mupirocin ointment, VITAMIN A PO, Cholecalciferol (VITAMIN D3 PO), and estradiol.   No orders of the defined types were placed in this encounter.   Return precautions given.   Risks, benefits, and alternatives of the medications and treatment plan prescribed today were discussed, and patient expressed understanding.   Education regarding symptom management and diagnosis given to patient on AVS.  Continue to follow with [name], FNP for routine health maintenance.   [name] and I agreed with plan.   [name], FNP

## 2022-09-23 ENCOUNTER — Other Ambulatory Visit: Payer: Self-pay | Admitting: Family

## 2022-09-23 LAB — IGP, APTIMA HPV: HPV Aptima: POSITIVE — AB

## 2022-09-24 LAB — ACUTE HEP PANEL AND HEP B SURFACE AB
Hep A IgM: NEGATIVE
Hep B C IgM: NEGATIVE
Hep C Virus Ab: NONREACTIVE
Hepatitis B Surf Ab Quant: 20 m[IU]/mL (ref >9.9)
Hepatitis B Surface Ag: NEGATIVE

## 2022-09-26 ENCOUNTER — Encounter: Payer: Self-pay | Admitting: Family

## 2022-09-28 ENCOUNTER — Ambulatory Visit
Admission: RE | Admit: 2022-09-28 | Discharge: 2022-09-28 | Disposition: A | Discharge status: Home / Self Care | Payer: Managed Care, Other (non HMO) | Source: Ambulatory Visit | Attending: Family | Admitting: Family

## 2022-09-28 DIAGNOSIS — K469 Unspecified abdominal hernia without obstruction or gangrene: Secondary | ICD-10-CM | POA: Insufficient documentation

## 2022-09-29 ENCOUNTER — Other Ambulatory Visit: Payer: Self-pay | Admitting: Family

## 2022-09-29 DIAGNOSIS — R87810 Cervical high risk human papillomavirus (HPV) DNA test positive: Secondary | ICD-10-CM

## 2022-09-29 LAB — CHLAMYDIA/GONOCOCCUS/TRICHOMONAS, NAA
Chlamydia by NAA: NEGATIVE
Gonococcus by NAA: NEGATIVE
Trich vag by NAA: NEGATIVE

## 2022-09-29 NOTE — Progress Notes (Signed)
close

## 2022-10-30 ENCOUNTER — Ambulatory Visit: Payer: Managed Care, Other (non HMO) | Admitting: Obstetrics and Gynecology

## 2022-10-30 ENCOUNTER — Other Ambulatory Visit (HOSPITAL_COMMUNITY)
Admission: RE | Admit: 2022-10-30 | Discharge: 2022-10-30 | Disposition: A | Discharge status: Home / Self Care | Payer: Managed Care, Other (non HMO) | Source: Ambulatory Visit | Attending: Obstetrics and Gynecology | Admitting: Obstetrics and Gynecology

## 2022-10-30 ENCOUNTER — Encounter: Payer: Self-pay | Admitting: Obstetrics and Gynecology

## 2022-10-30 VITALS — BP 151/83 | HR 64 | Ht 68.0 in | Wt 165.1 lb

## 2022-10-30 DIAGNOSIS — N952 Postmenopausal atrophic vaginitis: Secondary | ICD-10-CM

## 2022-10-30 DIAGNOSIS — N9419 Other specified dyspareunia: Secondary | ICD-10-CM

## 2022-10-30 DIAGNOSIS — A630 Anogenital (venereal) warts: Secondary | ICD-10-CM | POA: Insufficient documentation

## 2022-10-30 DIAGNOSIS — N72 Inflammatory disease of cervix uteri: Secondary | ICD-10-CM | POA: Insufficient documentation

## 2022-10-30 DIAGNOSIS — B977 Papillomavirus as the cause of diseases classified elsewhere: Secondary | ICD-10-CM

## 2022-10-30 DIAGNOSIS — N870 Mild cervical dysplasia: Secondary | ICD-10-CM | POA: Diagnosis not present

## 2022-10-30 MED ORDER — ESTRADIOL 0.1 MG/GM VA CREA: TOPICAL_CREAM | VAGINAL | 1 refills | Status: DC

## 2022-10-30 NOTE — Progress Notes (Signed)
Patient presents today for colposcopy due to HPV. She states in her youth having abnormal pap smears, she does not recall any results. Patient states no additional questions or concerns at this time.

## 2022-10-30 NOTE — Progress Notes (Signed)
Referring Provider: [name]  HPI:  [name] is a 56 y.o.  [id number]  who presents today for evaluation and management of abnormal cervical cytology.    Dysplasia History: Remote history of "abnormal Pap".  Most recent Pap shows negative cytology     HPV: Positive  ROS:  Pertinent items noted in HPI and remainder of comprehensive ROS otherwise negative.  OB History  Gravida Para Term Preterm AB Living  '5 2 2   3 2  '$ SAB IAB Ectopic Multiple Live Births  '2 1     2    '$ # Outcome Date GA Lbr Len/2nd Weight Sex Delivery Anes PTL Lv  5 Term 2001   8 lb 11 oz (3.941 kg) F CS-Unspec   LIV  4 Term 1992   6 lb 7 oz (2.92 kg) F CS-Unspec   LIV  3 IAB 1988          2 SAB           1 SAB             Obstetric Comments  1st Menstrual Cycle:  ?  1st Pregnancy:  21    Past Medical History:  Diagnosis Date   Asthma due to seasonal allergies    no inhaler   History of diabetes mellitus, type II    per pt resolved after bariatric surgery 2017   History of hypertension    per pt resolved after bariatric surgery 2017   History of kidney stones    History of obstructive sleep apnea    per pt hx osa w/ cpap,  retested after bariatric surgery 2017 result negative   History of skin cancer    nose, not melanoma   Hydronephrosis, right    Hyperlipidemia    MRSA carrier    NAFL (nonalcoholic fatty liver)    Osteoporosis    Primary biliary cirrhosis ([place])    Primary biliary cirrhosis ([place])    Renal calculi    per CT 04-18-2019 bilateral , non-obstructive   S/P bariatric surgery 08/03/2016   duodenal switch w/ hh repair   Ureteral calculus, right    Wears glasses     Past Surgical History:  Procedure Laterality Date   AUGMENTATION MAMMAPLASTY Bilateral 07/2021   Implants and lift   AUGMENTATION MAMMAPLASTY Bilateral 07/05/2022   Dr Ysidro Evert, replaced implants   BARIATRIC SURGERY  08-03-2016    dr Darnell Level [facility] ,  [place]   LAPAROSCOPY DUODENAL SWITCH AND HIATAL HERNIA  REPAIR   BRACHIOPLASTY     BREAST BIOPSY Right 2017   FIBROCYSTIC CHANGES WITH CALCIFICATIONS   CESAREAN SECTION  x2  last one 09-10-2000   COLONOSCOPY WITH PROPOFOL N/A 09/11/2019   Procedure: COLONOSCOPY WITH PROPOFOL;  Surgeon: Jonathon Bellows, MD;  Location: [facility] ENDOSCOPY;  Service: Gastroenterology;  Laterality: N/A;   CYSTOSCOPY WITH RETROGRADE PYELOGRAM, URETEROSCOPY AND STENT PLACEMENT Right 02-07-2012  '@[facility]    CYSTOSCOPY WITH STENT PLACEMENT Right 09/10/2017   Procedure: CYSTOSCOPY WITH STENT PLACEMENT;  Surgeon: Abbie Sons, MD;  Location: [facility]S;  Service: Urology;  Laterality: Right;   CYSTOSCOPY/URETEROSCOPY/HOLMIUM LASER/STENT PLACEMENT Right 04/24/2019   Procedure: CYSTOSCOPY/URETEROSCOPY/STENT PLACEMENT/ RIGHT RETROGRADE;  Surgeon: Festus Aloe, MD;  Location: [facility];  Service: Urology;  Laterality: Right;   DX LAPAROSCOPY/  EGD/  HIATAL HERNIA REPAIR CLOSURE OF INTERNAL MENSENTRY DEFECT  10-28-2017   '@[facility] ,  [place]   EXTRACORPOREAL SHOCK WAVE LITHOTRIPSY N/A 01/30/2019   Procedure: EXTRACORPOREAL SHOCK WAVE LITHOTRIPSY (ESWL) LEFT/  POSSIBLE RIGHT;  Surgeon: Festus Aloe, MD;  Location: [facility];  Service: Urology;  Laterality: N/A;   EXTRACORPOREAL SHOCK WAVE LITHOTRIPSY Right 03/20/2019   Procedure: EXTRACORPOREAL SHOCK WAVE LITHOTRIPSY (ESWL);  Surgeon: Franchot Gallo, MD;  [facility]: [facility];  Service: Urology;  Laterality: Right;   EXTRACORPOREAL SHOCK WAVE LITHOTRIPSY Left 10/30/2021   Procedure: EXTRACORPOREAL SHOCK WAVE LITHOTRIPSY (ESWL);  Surgeon: Ardis Hughs, MD;  Location: [facility];  Service: Urology;  Laterality: Left;   LAPAROSCOPIC CHOLECYSTECTOMY  10-15-2016   '@[facility]  ,  [place]   LIPOSUCTION     PANNICULECTOMY     PLACEMENT OF BREAST IMPLANTS Bilateral    TONSILLECTOMY AND ADENOIDECTOMY  child   URETEROSCOPY WITH HOLMIUM LASER LITHOTRIPSY Right 09/10/2017   Procedure: URETEROSCOPY WITH HOLMIUM LASER  LITHOTRIPSY;  Surgeon: Abbie Sons, MD;  Location: [facility];  Service: Urology;  Laterality: Right;    SOCIAL HISTORY:  Social History   Substance and Sexual Activity  Alcohol Use No   Alcohol/week: 0.0 standard drinks of alcohol    Social History   Substance and Sexual Activity  Drug Use Never     Family History  Problem Relation Age of Onset   Alcohol abuse Mother    Hyperlipidemia Mother    Hypertension Mother    Lymphoma Mother        primary site : brain   Leukemia Mother    Hyperlipidemia Father    Hypertension Father    Parkinson's disease Father    Drug abuse Sister    Cancer Maternal Grandmother        breast cancer   Breast cancer Maternal Grandmother 26   Liver cancer Paternal Grandfather    Kidney cancer Neg Hx    Prostate cancer Neg Hx     ALLERGIES:  Ivp dye [iodinated contrast media], Latex, Tolmetin, Gallium nitrate, Insulin detemir, Nsaids, and Gadolinium derivatives  She has a current medication list which includes the following prescription(s): calcium, cholecalciferol, multiple vitamins-minerals, mupirocin ointment, prevident 5000 booster plus, ursodiol, vitamin a, and estradiol.  Physical Exam: -Vitals:  BP (!) 151/83   Pulse 64   Ht '5\' 8"'$  (1.727 m)   Wt 165 lb 1.6 oz (74.9 kg)   LMP 07/11/2016 (Exact Date)   BMI 25.10 kg/m   PROCEDURE: Colposcopy performed with 4% acetic acid after verbal consent obtained                           -Aceto-white Lesions Location(s): See above              -Biopsy performed at 7 o'clock               -ECC indicated and performed: Yes.       -Biopsy sites made hemostatic with pressure and Monsel's solution   -Satisfactory colposcopy: Yes.      -Evidence of Invasive cervical CA :  NO  ASSESSMENT:  [name] is a 56 y.o. 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 Atrophic vaginitis   3. Dyspareunia due to medical condition in female   .  PLAN: 1.  I discussed the grading system of pap  smears and HPV high risk viral types.  We will discuss management after colpo results return.  Meds ordered this encounter  Medications   estradiol (ESTRACE) 0.1 MG/GM vaginal cream    Sig: PLACE 1/4 APPLICATORFUL IN THE VAGINA AT BEDTIME  Dispense:  42.5 g    Refill:  1           F/U  Return in about 2 weeks (around 11/13/2022).  Jeannie Fend ,MD 10/30/2022,12:02 PM

## 2022-11-03 LAB — SURGICAL PATHOLOGY

## 2022-11-04 ENCOUNTER — Encounter: Payer: Self-pay | Admitting: Obstetrics and Gynecology

## 2022-11-11 ENCOUNTER — Encounter: Payer: Self-pay | Admitting: Obstetrics and Gynecology

## 2022-11-11 ENCOUNTER — Telehealth: Payer: Managed Care, Other (non HMO) | Admitting: Obstetrics and Gynecology

## 2022-11-11 DIAGNOSIS — N870 Mild cervical dysplasia: Secondary | ICD-10-CM | POA: Diagnosis not present

## 2022-11-11 NOTE — Progress Notes (Signed)
Virtual Visit via Video Note  I connected with [name] on 11/11/22 at  7:45 AM EST by video and verified that I was speaking with the correct person using two identifiers.    [name] is a 57 y.o. [id number] who LMP was Patient's last menstrual period was 07/11/2016 (exact date). I discussed the limitations, risks, security and privacy concerns of performing an evaluation and management service by video and the availability of in person appointments. I also discussed with the patient that there may be a patient responsible charge related to this service. The patient expressed understanding and agreed to proceed.  Location of patient:  Home  Patient gave explicit verbal consent for video visit:  YES  Location of provider:  [facility] office  Persons other than physician and patient involved in provider conference:  None   Subjective:   History of Present Illness:    She has a history of abnormal cytology but positive HPV.  She presented for and underwent a colposcopy with a single cervical biopsy and an ECC.  Hx: The following portions of the patient's history were reviewed and updated as appropriate:             She  has a past medical history of Asthma due to seasonal allergies, History of diabetes mellitus, type II, History of hypertension, History of kidney stones, History of obstructive sleep apnea, History of skin cancer, Hydronephrosis, right, Hyperlipidemia, MRSA carrier, NAFL (nonalcoholic fatty liver), Osteoporosis, Primary biliary cirrhosis ([place]), Primary biliary cirrhosis ([place]), Renal calculi, S/P bariatric surgery (08/03/2016), Ureteral calculus, right, and Wears glasses. She does not have any pertinent problems on file. She  has a past surgical history that includes Cesarean section (x2  last one 09-10-2000); Bariatric Surgery (08-03-2016    dr Darnell Level [facility] ,  [place]); Ureteroscopy with holmium laser lithotripsy (Right, 09/10/2017); Cystoscopy with stent  placement (Right, 09/10/2017); Extracorporeal shock wave lithotripsy (N/A, 01/30/2019); Extracorporeal shock wave lithotripsy (Right, 03/20/2019); Laparoscopic cholecystectomy (10-15-2016   '@[facility]  ,  [place]); DX LAPAROSCOPY/  EGD/  HIATAL HERNIA REPAIR CLOSURE OF INTERNAL MENSENTRY DEFECT (10-28-2017   '@[facility] ,  [place]); Cystoscopy with retrograde pyelogram, ureteroscopy and stent placement (Right, 02-07-2012  '@[facility] ); Tonsillectomy and adenoidectomy (child); Cystoscopy/ureteroscopy/holmium laser/stent placement (Right, 04/24/2019); Colonoscopy with propofol (N/A, 09/11/2019); Breast biopsy (Right, 2017); Placement of breast implants (Bilateral); Augmentation mammaplasty (Bilateral, 07/2021); Brachioplasty; Panniculectomy; Liposuction; Extracorporeal shock wave lithotripsy (Left, 10/30/2021); and Augmentation mammaplasty (Bilateral, 07/05/2022). Her family history includes Alcohol abuse in her mother; Breast cancer (age of onset: 85) in her maternal grandmother; Cancer in her maternal grandmother; Drug abuse in her sister; Hyperlipidemia in her father and mother; Hypertension in her father and mother; Leukemia in her mother; Liver cancer in her paternal grandfather; Lymphoma in her mother; Parkinson's disease in her father. She  reports that she has never smoked. She has never used smokeless tobacco. She reports that she does not drink alcohol and does not use drugs. She has a current medication list which includes the following prescription(s): calcium, cholecalciferol, estradiol, multiple vitamins-minerals, mupirocin ointment, prevident 5000 booster plus, ursodiol, and vitamin a. She is allergic to ivp dye [iodinated contrast media], latex, tolmetin, gallium nitrate, insulin detemir, nsaids, and gadolinium derivatives.       Review of Systems:  Review of Systems  Constitutional: Denied constitutional symptoms, night sweats, recent illness, fatigue, fever, insomnia and weight loss.  Eyes: Denied eye  symptoms, eye pain, photophobia, vision change and visual disturbance.  Ears/Nose/Throat/Neck: Denied ear, nose, throat or neck  symptoms, hearing loss, nasal discharge, sinus congestion and sore throat.  Cardiovascular: Denied cardiovascular symptoms, arrhythmia, chest pain/pressure, edema, exercise intolerance, orthopnea and palpitations.  Respiratory: Denied pulmonary symptoms, asthma, pleuritic pain, productive sputum, cough, dyspnea and wheezing.  Gastrointestinal: Denied, gastro-esophageal reflux, melena, nausea and vomiting.  Genitourinary: Denied genitourinary symptoms including symptomatic vaginal discharge, pelvic relaxation issues, and urinary complaints.  Musculoskeletal: Denied musculoskeletal symptoms, stiffness, swelling, muscle weakness and myalgia.  Dermatologic: Denied dermatology symptoms, rash and scar.  Neurologic: Denied neurology symptoms, dizziness, headache, neck pain and syncope.  Psychiatric: Denied psychiatric symptoms, anxiety and depression.  Endocrine: Denied endocrine symptoms including hot flashes and night sweats.   Meds:   Current Outpatient Medications on File Prior to Visit  Medication Sig Dispense Refill   CALCIUM PO Take by mouth.     Cholecalciferol (VITAMIN D3 PO) Take by mouth daily.     estradiol (ESTRACE) 0.1 MG/GM vaginal cream PLACE 1/4 APPLICATORFUL IN THE VAGINA AT BEDTIME 42.5 g 1   Multiple Vitamins-Minerals (BARIATRIC MULTIVITAMINS/IRON PO) Take by mouth.     mupirocin ointment (BACTROBAN) 2 % Apply 1 application topically 2 (two) times daily. X 5 days nose 30 g 0   PREVIDENT 5000 BOOSTER PLUS 1.1 % PSTE Place onto teeth as directed.     ursodiol (ACTIGALL) 500 MG tablet Take 250 mg by mouth in the morning and at bedtime. Take two tablets by mouth in the morning & three tablets by mouth in the evening.     VITAMIN A PO Take by mouth daily.     No current facility-administered medications on file prior to visit.    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   Headache 07/05/2020   Osteomalacia 12/27/2019   History of weight loss 11/28/2019   Primary biliary cirrhosis ([place]) 11/28/2019   Localized osteoporosis without current pathological fracture 06/26/2019   Hypocalcemia 06/26/2019   Secondary hyperparathyroidism ([place]) 06/26/2019   Multinodular goiter 06/26/2019   Dysphagia 05/05/2019   Osteoporosis 05/03/2019   Memory loss 02/13/2019   Elevated liver function tests 10/13/2017   Epigastric abdominal pain 10/13/2017   History of kidney stones 10/13/2017   Hypertension 10/13/2017   Status post bariatric surgery 09/03/2017   Right ureteral calculus 08/31/2017   Hydronephrosis with urinary obstruction due to ureteral calculus 08/31/2017   Nephrolithiasis 08/31/2017   Screen for STD (sexually transmitted disease) 07/26/2017   Sleep apnea 10/06/2016   Candidal intertrigo 09/21/2016   H/O gastric bypass 09/21/2016   Hiatal hernia 04/21/2016   Elevated liver enzymes 09/04/2014   Vitamin D deficiency 09/03/2014   Routine general medical examination at a health care facility 01/11/2014     1. Dysplasia of cervix, low grade (CIN 1)     Also found to have a positive ECC with CIN-1  Plan:            1.  Have discussed natural course of history of HPV and cervical dysplasia.  Have recommended follow-up Pap smear in 1 year.  Plan extensive Cytobrush ECC at time of Pap in 1 year as well as viral typing.  All questions answered. Orders No orders of the defined types were placed in this encounter.   No orders of the defined types were placed in this encounter.  F/U  No follow-ups on file. I spent 22 minutes involved in the care of this patient preparing to see the  patient by obtaining and reviewing her medical history (including labs, imaging tests and prior procedures), documenting clinical information in the electronic health record (EHR), counseling and coordinating care plans, writing and sending prescriptions, ordering tests or procedures and in direct communicating with the patient and medical staff discussing pertinent items from her history and physical exam.  Finis Bud, M.D. 11/11/2022 7:57 AM

## 2023-01-05 ENCOUNTER — Ambulatory Visit: Payer: Managed Care, Other (non HMO) | Admitting: Podiatry

## 2023-01-05 VITALS — BP 124/75 | HR 61

## 2023-01-05 DIAGNOSIS — B351 Tinea unguium: Secondary | ICD-10-CM

## 2023-01-05 NOTE — Progress Notes (Signed)
Chief Complaint  Patient presents with   Nail Problem    "My legs are lifting from the bed.  My daughter said the bulges on the side of my feet aren't normal, they don't hurt." N - nails lifting L - bilateral D - couple of years O - gradually worse C - vertical ridges, peeling up from the bed A - none T - change my shoes and socks every 8 hours    Subjective: 57 y.o. female presenting today as a new patient for evaluation of discoloration with lifting of the toenail plates to the bilateral great toes.  This is been ongoing for a few years now.  Gradual onset.  She also stated that her daughter who is a massage therapist told her she had abnormal 'bumps' to the lateral aspect of bilateral feet.  They have never bothered her before but she would like to have them evaluated.  Past Medical History:  Diagnosis Date   Asthma due to seasonal allergies    no inhaler   History of diabetes mellitus, type II    per pt resolved after bariatric surgery 2017   History of hypertension    per pt resolved after bariatric surgery 2017   History of kidney stones    History of obstructive sleep apnea    per pt hx osa w/ cpap,  retested after bariatric surgery 2017 result negative   History of skin cancer    nose, not melanoma   Hydronephrosis, right    Hyperlipidemia    MRSA carrier    NAFL (nonalcoholic fatty liver)    Osteoporosis    Primary biliary cirrhosis ([place])    Primary biliary cirrhosis ([place])    Renal calculi    per CT 04-18-2019 bilateral , non-obstructive   S/P bariatric surgery 08/03/2016   duodenal switch w/ hh repair   Ureteral calculus, right    Wears glasses     Past Surgical History:  Procedure Laterality Date   AUGMENTATION MAMMAPLASTY Bilateral 07/2021   Implants and lift   AUGMENTATION MAMMAPLASTY Bilateral 07/05/2022   Dr Ysidro Evert, replaced implants   BARIATRIC SURGERY  08-03-2016    dr Darnell Level [facility] ,  [place]   LAPAROSCOPY DUODENAL SWITCH AND HIATAL HERNIA REPAIR    BRACHIOPLASTY     BREAST BIOPSY Right 2017   FIBROCYSTIC CHANGES WITH CALCIFICATIONS   CESAREAN SECTION  x2  last one 09-10-2000   COLONOSCOPY WITH PROPOFOL N/A 09/11/2019   Procedure: COLONOSCOPY WITH PROPOFOL;  Surgeon: Jonathon Bellows, MD;  Location: [facility] ENDOSCOPY;  Service: Gastroenterology;  Laterality: N/A;   CYSTOSCOPY WITH RETROGRADE PYELOGRAM, URETEROSCOPY AND STENT PLACEMENT Right 02-07-2012  '@[facility]    CYSTOSCOPY WITH STENT PLACEMENT Right 09/10/2017   Procedure: CYSTOSCOPY WITH STENT PLACEMENT;  Surgeon: Abbie Sons, MD;  Location: [facility];  Service: Urology;  Laterality: Right;   CYSTOSCOPY/URETEROSCOPY/HOLMIUM LASER/STENT PLACEMENT Right 04/24/2019   Procedure: CYSTOSCOPY/URETEROSCOPY/STENT PLACEMENT/ RIGHT RETROGRADE;  Surgeon: Festus Aloe, MD;  [facility];  Service: Urology;  Laterality: Right;   DX LAPAROSCOPY/  EGD/  HIATAL HERNIA REPAIR CLOSURE OF INTERNAL MENSENTRY DEFECT  10-28-2017   '@[facility] ,  [place]   EXTRACORPOREAL SHOCK WAVE LITHOTRIPSY N/A 01/30/2019   Procedure: EXTRACORPOREAL SHOCK WAVE LITHOTRIPSY (ESWL) LEFT/ POSSIBLE RIGHT;  Surgeon: Festus Aloe, MD;  Location[facility];  Service: Urology;  Laterality: N/A;   EXTRACORPOREAL SHOCK WAVE LITHOTRIPSY Right 03/20/2019   Procedure: EXTRACORPOREAL SHOCK WAVE LITHOTRIPSY (ESWL);  Surgeon: Franchot Gallo, MD;  Location: [facility];  Service: Urology;  Laterality: Right;   EXTRACORPOREAL SHOCK WAVE LITHOTRIPSY Left 10/30/2021   Procedure: EXTRACORPOREAL SHOCK WAVE LITHOTRIPSY (ESWL);  Surgeon: Ardis Hughs, MD;  Location: [facility];  Service: Urology;  Laterality: Left;   LAPAROSCOPIC CHOLECYSTECTOMY  10-15-2016   '@[facility]'$  ,  [place]   LIPOSUCTION     PANNICULECTOMY     PLACEMENT OF BREAST IMPLANTS Bilateral    TONSILLECTOMY AND ADENOIDECTOMY  child   URETEROSCOPY WITH HOLMIUM LASER LITHOTRIPSY Right 09/10/2017   Procedure: URETEROSCOPY WITH HOLMIUM LASER  LITHOTRIPSY;  Surgeon: Abbie Sons, MD;  Location: [facility];  Service: Urology;  Laterality: Right;    Allergies  Allergen Reactions   Ivp Dye [Iodinated Contrast Media]     Patient 'codes' with IV contrast   Latex Shortness Of Breath and Cough   Tolmetin Hives   Gallium Nitrate Hives    AND Gadolinium Derivatives    Insulin Detemir Hives and Swelling    Lump at injection site   Nsaids Hives    Per pt stated has to take ibuprofen with a antihistimine   Gadolinium Derivatives Hives    Pt c/o itching after contrast injection 03/15/19 @ 8:45am. [facility] Per Dr. Kathlene Cote document this as allergic reaction.     Objective: Physical Exam General: The patient is alert and oriented x3 in no acute distress.  Dermatology: There is a small focal area of discoloration with thickening to the lateral portion of the bilateral great toenails.  Skin is warm, dry and supple bilateral lower extremities. Negative for open lesions or macerations.  Vascular: Palpable pedal pulses bilaterally. No edema or erythema noted. Capillary refill within normal limits.  Neurological: Epicritic and protective threshold grossly intact bilaterally.   Musculoskeletal Exam: No pedal deformity noted  Assessment: #1 Onychomycosis of toenails bilateral great toes #2 prominent fifth metatarsal tubercles; asymptomatic  Plan of Care:  #1 Patient was evaluated.  Explained that the 'bumps' to the lateral aspect of the bilateral feet are normal anatomy.  She does have prominent fifth metatarsal tubercles however they are completely asymptomatic. #2  Today we discussed different treatment options including oral, topical, and laser antifungal treatment modalities.  We discussed their efficacies and side effects.  The patient's fungal nail and discoloration is very localized to the lateral aspects of the bilateral great toenail plates.  I do believe topical antifungal would be effective and beneficial for the patient.  She does  have history of liver pathology and no oral antifungals were recommended today #3 OTC Tolcylen antifungal topical dispensed at checkout.  Apply daily #4 mechanical debridement of the bilateral great toenails was also performed as a courtesy for the patient  #5 5 return to clinic as needed   Edrick Kins, DPM [facility]  Dr. Edrick Kins, DPM    [address], [place] [address]                Office [phone]  Fax [phone]

## 2023-01-11 ENCOUNTER — Encounter: Payer: Self-pay | Admitting: Family

## 2023-01-11 ENCOUNTER — Ambulatory Visit (INDEPENDENT_AMBULATORY_CARE_PROVIDER_SITE_OTHER): Payer: Managed Care, Other (non HMO)

## 2023-01-11 ENCOUNTER — Telehealth: Payer: Self-pay | Admitting: Family

## 2023-01-11 ENCOUNTER — Ambulatory Visit: Payer: Managed Care, Other (non HMO) | Admitting: Family

## 2023-01-11 VITALS — BP 118/74 | HR 56 | Temp 98.0°F | Ht 68.0 in | Wt 170.0 lb

## 2023-01-11 DIAGNOSIS — R251 Tremor, unspecified: Secondary | ICD-10-CM

## 2023-01-11 DIAGNOSIS — Z9884 Bariatric surgery status: Secondary | ICD-10-CM | POA: Diagnosis not present

## 2023-01-11 NOTE — Assessment & Plan Note (Addendum)
New ( to me). Question if benign essential tremor.  Question if wrist pain and weakness in hands is related to carpal tunnel syndrome.  Pending thorough lab evaluation, MRI brain.  Consider neurology referral, particularly if heightened concern for Parkinson's disease, as well as referral to neurology for EMG testing for suspected CTS.

## 2023-01-11 NOTE — Progress Notes (Signed)
Assessment & Plan:  Tremor Assessment & Plan: New ( to me). Question if benign essential tremor.  Question if wrist pain and weakness in hands is related to carpal tunnel syndrome.  Pending thorough lab evaluation, MRI brain.  Consider neurology referral, particularly if heightened concern for Parkinson's disease, as well as referral to neurology for EMG testing for suspected CTS.   Orders: -     TSH -     VITAMIN D 25 Hydroxy (Vit-D Deficiency, Fractures) -     Vitamin B12 -     Vitamin A -     Vitamin K1, Serum -     Vitamin E -     Iron, TIBC and Ferritin Panel -     DG Wrist Complete Left; Future -     DG Wrist Complete Right; Future -     MR BRAIN W WO CONTRAST; Future  H/O gastric bypass -     TSH -     VITAMIN D 25 Hydroxy (Vit-D Deficiency, Fractures) -     Vitamin B12 -     Vitamin A -     Vitamin K1, Serum -     Vitamin E -     Iron, TIBC and Ferritin Panel -     Amb ref to Medical Nutrition Therapy-MNT     Return precautions given.   Risks, benefits, and alternatives of the medications and treatment plan prescribed today were discussed, and patient expressed understanding.   Education regarding symptom management and diagnosis given to patient on AVS either electronically or printed.  Return in about 6 weeks (around 02/22/2023).  [name], FNP  Subjective:    Patient ID: [name], female    DOB: [birth year], 57 y.o.   MRN: [id number]  CC: [name] is a 57 y.o. female who presents today for an acute visit.    HPI: Complains of left wrist stabbing pain for couple of months, comes and goes. It has been happening more frequently.  She has noticed that is is hard to open chip bags and she is dropping plates. Weakness in both hands, worse in left hand.    She limits use of NSAIDs.   No pain necessarily when she is working out.  Right hand dominant  She is lifting weights , however she is very careful and no known injury.    No numbness in hands.   She has noticed tremor in bilateral hands and also in neck 'for a long time'. Worse when tired, at rest.   She has noticed migrainous symptom with left sided aura 'ring of light'. She sees 'halo in eye' which lasts 30 min to 1 hour and then resolved on its own.  No vision loss.  No associated head pain.    Endorses imbalance.  No alcohol.   She has decreased caffeine use.     H/o osteoporosis    Father has h/o PD.     She is concerned for vitamin deficiency History of bariatric surgery.  She is compliant with bariatric vitamin   Allergies: Ivp dye [iodinated contrast media], Latex, Tolmetin, Gallium nitrate, Insulin detemir, Nsaids, and Gadolinium derivatives Current Outpatient Medications on File Prior to Visit  Medication Sig Dispense Refill   calcium citrate (CALCITRATE - DOSED IN MG ELEMENTAL CALCIUM) 950 (200 Ca) MG tablet Take 200 mg of elemental calcium by mouth daily.     CALCIUM PO Take by mouth.     Cholecalciferol (VITAMIN D3 PO) Take by  mouth daily.     estradiol (ESTRACE) 0.1 MG/GM vaginal cream PLACE 1/4 APPLICATORFUL IN THE VAGINA AT BEDTIME 42.5 g 1   Multiple Vitamins-Minerals (BARIATRIC MULTIVITAMINS/IRON PO) Take by mouth.     mupirocin ointment (BACTROBAN) 2 % Apply 1 application topically 2 (two) times daily. X 5 days nose 30 g 0   Pediatric Multivitamins-Iron (FRUITY CHEWS/IRON PO) Take by mouth.     PREVIDENT 5000 BOOSTER PLUS 1.1 % PSTE Place onto teeth as directed.     saw palmetto 80 MG capsule Take 80 mg by mouth 2 (two) times daily.     sulfamethoxazole-trimethoprim (BACTRIM DS) 800-160 MG tablet Take 1 tablet by mouth 2 (two) times daily.     ursodiol (ACTIGALL) 500 MG tablet Take 250 mg by mouth in the morning and at bedtime. Take two tablets by mouth in the morning & three tablets by mouth in the evening.     VITAMIN A PO Take by mouth daily.     No current facility-administered medications on file prior to visit.     Review of Systems  Constitutional:  Negative for chills and fever.  Eyes:  Positive for visual disturbance.  Respiratory:  Negative for cough.   Cardiovascular:  Negative for chest pain and palpitations.  Gastrointestinal:  Negative for nausea and vomiting.  Musculoskeletal:  Positive for arthralgias. Negative for joint swelling.  Neurological:  Positive for tremors and weakness. Negative for numbness and headaches.      Objective:    BP 118/74   Pulse (!) 56   Temp 98 F (36.7 C)   Ht '5\' 8"'$  (1.727 m)   Wt 170 lb (77.1 kg)   LMP 07/11/2016 (Exact Date)   SpO2 98%   BMI 25.85 kg/m   BP Readings from Last 3 Encounters:  01/11/23 118/74  01/05/23 124/75  10/30/22 (!) 151/83   Wt Readings from Last 3 Encounters:  01/11/23 170 lb (77.1 kg)  10/30/22 165 lb 1.6 oz (74.9 kg)  09/18/22 160 lb 12.8 oz (72.9 kg)    Physical Exam Vitals reviewed.  Constitutional:      Appearance: She is well-developed.  HENT:     Mouth/Throat:     Pharynx: Uvula midline.  Eyes:     Conjunctiva/sclera: Conjunctivae normal.     Pupils: Pupils are equal, round, and reactive to light.     Comments: Fundus normal bilaterally.   Cardiovascular:     Rate and Rhythm: Normal rate and regular rhythm.     Pulses: Normal pulses.     Heart sounds: Normal heart sounds.  Pulmonary:     Effort: Pulmonary effort is normal.     Breath sounds: Normal breath sounds. No wheezing, rhonchi or rales.  Musculoskeletal:     Right wrist: No swelling or tenderness.     Left wrist: Tenderness present. No swelling.     Right hand: No bony tenderness. Normal range of motion.     Left hand: No bony tenderness. Normal range of motion.       Arms:     Comments: Negative Phalen, Tinel test bilaterally.  Skin:    General: Skin is warm and dry.  Neurological:     Mental Status: She is alert.     Cranial Nerves: No cranial nerve deficit.     Sensory: No sensory deficit.     Motor: Tremor present.     Deep  Tendon Reflexes:     Reflex Scores:      Bicep reflexes  are 2+ on the right side and 2+ on the left side.      Patellar reflexes are 2+ on the right side and 2+ on the left side.    Comments: No cogwheeling , pill rolling nor shuffling gait. fine tremor of intention noticed with finger-to-nose bilaterally.   Psychiatric:        Speech: Speech normal.        Behavior: Behavior normal.        Thought Content: Thought content normal.

## 2023-01-11 NOTE — Patient Instructions (Addendum)
Consider eye exam due to aura.   Let me know how you are doing.

## 2023-01-11 NOTE — Telephone Encounter (Signed)
Lft pt vm to call ofc to sch MRI. thanks

## 2023-01-16 ENCOUNTER — Encounter: Payer: Self-pay | Admitting: Family

## 2023-01-18 ENCOUNTER — Other Ambulatory Visit: Payer: Self-pay | Admitting: Family

## 2023-01-18 DIAGNOSIS — Z91041 Radiographic dye allergy status: Secondary | ICD-10-CM

## 2023-01-18 MED ORDER — PREDNISONE 50 MG PO TABS: ORAL_TABLET | ORAL | 0 refills | Status: DC

## 2023-01-19 ENCOUNTER — Ambulatory Visit
Admission: RE | Admit: 2023-01-19 | Discharge: 2023-01-19 | Disposition: A | Discharge status: Home / Self Care | Payer: Managed Care, Other (non HMO) | Source: Ambulatory Visit | Attending: Family | Admitting: Family

## 2023-01-19 DIAGNOSIS — R251 Tremor, unspecified: Secondary | ICD-10-CM | POA: Diagnosis present

## 2023-01-19 MED ORDER — GADOBUTROL 1 MMOL/ML IV SOLN
7.0000 mL | Freq: Once | INTRAVENOUS | Status: AC | PRN
  Administered 2023-01-19: 7 mL via INTRAVENOUS

## 2023-01-22 ENCOUNTER — Encounter: Payer: Self-pay | Admitting: Family

## 2023-01-25 ENCOUNTER — Other Ambulatory Visit: Payer: Self-pay | Admitting: Family

## 2023-01-25 DIAGNOSIS — R251 Tremor, unspecified: Secondary | ICD-10-CM

## 2023-01-25 DIAGNOSIS — R9089 Other abnormal findings on diagnostic imaging of central nervous system: Secondary | ICD-10-CM

## 2023-01-27 ENCOUNTER — Ambulatory Visit (INDEPENDENT_AMBULATORY_CARE_PROVIDER_SITE_OTHER): Payer: Managed Care, Other (non HMO) | Admitting: Obstetrics and Gynecology

## 2023-01-27 ENCOUNTER — Encounter: Payer: Self-pay | Admitting: Obstetrics and Gynecology

## 2023-01-27 VITALS — BP 156/83 | HR 69 | Ht 68.0 in | Wt 167.0 lb

## 2023-01-27 DIAGNOSIS — N930 Postcoital and contact bleeding: Secondary | ICD-10-CM

## 2023-01-27 DIAGNOSIS — Z113 Encounter for screening for infections with a predominantly sexual mode of transmission: Secondary | ICD-10-CM

## 2023-01-27 DIAGNOSIS — N941 Unspecified dyspareunia: Secondary | ICD-10-CM

## 2023-01-27 DIAGNOSIS — N952 Postmenopausal atrophic vaginitis: Secondary | ICD-10-CM

## 2023-01-27 NOTE — Progress Notes (Signed)
Patient presents today due to postcoital bleeding and STD screening. She states over the past year experiencing spotting during and after intercourse. She would also like STD testing, [employer] employee. No additional concerns.

## 2023-01-27 NOTE — Progress Notes (Signed)
HPI:      [name] is a 57 y.o. [id number] who LMP was Patient's last menstrual period was 07/11/2016 (exact date).  Subjective:   She presents today because she would like to have STD testing.  She has some mild concern about her current sexual partner.  She has no symptoms of STDs that she knows.  She states that she stopped using her estrogen vaginal cream and she has noticed some spotting almost every time after intercourse.  She does use lubrication.  She reports that she often has unprotected intercourse. 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 She does not have any pertinent problems on file. She  has a past surgical history that includes Cesarean section (x2  last one 09-10-2000); Bariatric Surgery (08-03-2016    dr Darnell Level @[facility] ,  [place]); Ureteroscopy with holmium laser lithotripsy (Right, 09/10/2017); Cystoscopy with stent placement (Right, 09/10/2017); Extracorporeal shock wave lithotripsy (N/A, 01/30/2019); Extracorporeal shock wave lithotripsy (Right, 03/20/2019); Laparoscopic cholecystectomy (10-15-2016   @[facility]  ,  [place]); DX LAPAROSCOPY/  EGD/  HIATAL HERNIA REPAIR CLOSURE OF INTERNAL MENSENTRY DEFECT (10-28-2017   @[facility] ,  [place]); Cystoscopy with retrograde pyelogram, ureteroscopy and stent placement (Right, 02-07-2012  @[facility] ); Tonsillectomy  and adenoidectomy (child); Cystoscopy/ureteroscopy/holmium laser/stent placement (Right, 04/24/2019); Colonoscopy with propofol (N/A, 09/11/2019); Breast biopsy (Right, 2017); Placement of breast implants (Bilateral); Augmentation mammaplasty (Bilateral, 07/2021); Brachioplasty; Panniculectomy; Liposuction; Extracorporeal shock wave lithotripsy (Left, 10/30/2021); and Augmentation mammaplasty (Bilateral, 07/05/2022). Her family history includes Alcohol abuse in her mother; Breast cancer (age of onset: 63) in her maternal grandmother; Cancer in her maternal grandmother; Drug abuse in her sister; Hyperlipidemia in her father and mother; Hypertension in her father and mother; Leukemia in her mother; Liver cancer in her paternal grandfather; Lymphoma in her mother; Parkinson's disease in her father. She  reports that she has never smoked. She has never used smokeless tobacco. She reports that she does not drink alcohol and does not use drugs. She has a current medication list which includes the following prescription(s): calcium citrate, calcium, cholecalciferol, estradiol, multiple vitamins-minerals, mupirocin ointment, pediatric multivitamins-iron, prednisone, prevident 5000 booster plus, saw palmetto, and vitamin a. She is allergic to ivp dye [iodinated contrast media], latex, tolmetin, gallium nitrate, insulin detemir, nsaids, and gadolinium derivatives.       Review of Systems:  Review of Systems  Constitutional: Denied constitutional symptoms, night sweats, recent illness, fatigue, fever, insomnia and weight loss.  Eyes: Denied eye symptoms, eye pain, photophobia, vision change and visual disturbance.  Ears/Nose/Throat/Neck: Denied ear, nose, throat or neck symptoms, hearing loss, nasal discharge, sinus congestion and sore throat.  Cardiovascular: Denied cardiovascular symptoms, arrhythmia, chest pain/pressure, edema, exercise intolerance, orthopnea and palpitations.  Respiratory: Denied pulmonary  symptoms, asthma, pleuritic pain, productive sputum, cough, dyspnea and wheezing.  Gastrointestinal: Denied, gastro-esophageal reflux, melena, nausea and vomiting.  Genitourinary: See HPI for additional information.  Musculoskeletal: Denied musculoskeletal  symptoms, stiffness, swelling, muscle weakness and myalgia.  Dermatologic: Denied dermatology symptoms, rash and scar.  Neurologic: Denied neurology symptoms, dizziness, headache, neck pain and syncope.  Psychiatric: Denied psychiatric symptoms, anxiety and depression.  Endocrine: Denied endocrine symptoms including hot flashes and night sweats.   Meds:   Current Outpatient Medications on File Prior to Visit  Medication Sig Dispense Refill   calcium citrate (CALCITRATE - DOSED IN MG ELEMENTAL CALCIUM) 950 (200 Ca) MG tablet Take 200 mg of elemental calcium by mouth daily.     CALCIUM PO Take by mouth.     Cholecalciferol (VITAMIN D3 PO) Take by mouth daily.     estradiol (ESTRACE) 0.1 MG/GM vaginal cream PLACE 1/4 APPLICATORFUL IN THE VAGINA AT BEDTIME 42.5 g 1   Multiple Vitamins-Minerals (BARIATRIC MULTIVITAMINS/IRON PO) Take by mouth.     mupirocin ointment (BACTROBAN) 2 % Apply 1 application topically 2 (two) times daily. X 5 days nose 30 g 0   Pediatric Multivitamins-Iron (FRUITY CHEWS/IRON PO) Take by mouth.     predniSONE (DELTASONE) 50 MG tablet Take 1 tablet PO at 13, 7, and 1 hour prior to contrast administration. 5 tablet 0   PREVIDENT 5000 BOOSTER PLUS 1.1 % PSTE Place onto teeth as directed.     saw palmetto 80 MG capsule Take 80 mg by mouth 2 (two) times daily.     VITAMIN A PO Take by mouth daily.     No current facility-administered medications on file prior to visit.      Objective:     Vitals:   01/27/23 1532  BP: (!) 153/89  Pulse: 69   Filed Weights   01/27/23 1532  Weight: 167 lb (75.8 kg)              Physical examination   Pelvic:   Vulva: Normal appearance.  No lesions.  Vagina: No lesions  noted, moderate vaginal atrophy noted  Support: Normal pelvic support.  Urethra No masses tenderness or scarring.  Meatus Normal size without lesions or prolapse.  Cervix: Normal appearance.  No lesions.  Small area on the cervix noted to be friable-possibly the source of her bleeding-sclarified using silver nitrate.  Anus: Normal exam.  No lesions.  Perineum: Normal exam.  No lesions.             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   Headache 07/05/2020   Osteomalacia 12/27/2019   History of weight loss 11/28/2019   Primary biliary cirrhosis ([place]) 11/28/2019   Localized osteoporosis without current pathological fracture 06/26/2019   Hypocalcemia 06/26/2019   Secondary hyperparathyroidism ([place]) 06/26/2019   Multinodular goiter 06/26/2019   Dysphagia 05/05/2019   Osteoporosis 05/03/2019   Memory loss 02/13/2019   Elevated liver function tests 10/13/2017   Epigastric abdominal pain 10/13/2017   History of kidney stones 10/13/2017   Hypertension 10/13/2017   Status post bariatric surgery 09/03/2017   Right ureteral calculus 08/31/2017   Hydronephrosis with urinary obstruction due to ureteral calculus 08/31/2017   Nephrolithiasis 08/31/2017   Screen for STD (sexually transmitted disease) 07/26/2017   Sleep apnea 10/06/2016   Candidal intertrigo 09/21/2016   H/O gastric bypass 09/21/2016   Hiatal hernia 04/21/2016   Elevated liver enzymes 09/04/2014  Vitamin D deficiency 09/03/2014   Routine general medical examination at a health care facility 01/11/2014     1. Dyspareunia, female   2. Atrophic vaginitis   3. Screening for STD (sexually transmitted disease)   4. Bleeding after intercourse        Plan:             1.  Patient will restart vaginal estrogen.  Use 1 time per day then 3 times per week after 1 month.  One quarter of an applicator.  2.  STD testing performed Orders Orders Placed This Encounter  Procedures   HepB+HepC+HIV Panel   NuSwab Vaginitis Plus (VG+)   RPR   HSV 1 and 2 Ab, IgG    No orders of the defined types were placed in this encounter.     F/U  No follow-ups on file. I spent 23  minutes involved in the care of this patient preparing to see the patient by obtaining and reviewing her medical history (including labs, imaging tests and prior procedures), documenting clinical information in the electronic health record (EHR), counseling and coordinating care plans, writing and sending prescriptions, ordering tests or procedures and in direct communicating with the patient and medical staff discussing pertinent items from her history and physical exam.  Finis Bud, M.D. 01/27/2023 4:00 PM

## 2023-01-29 LAB — RPR: RPR Ser Ql: NONREACTIVE

## 2023-01-29 LAB — HEPB+HEPC+HIV PANEL
HIV Screen 4th Generation wRfx: NONREACTIVE
Hep B C IgM: NEGATIVE
Hep B Core Total Ab: NEGATIVE
Hep B E Ab: NEGATIVE
Hep B E Ag: NEGATIVE
Hep B Surface Ab, Qual: REACTIVE
Hep C Virus Ab: NONREACTIVE
Hepatitis B Surface Ag: NEGATIVE

## 2023-01-29 LAB — HSV 1 AND 2 AB, IGG
HSV 1 Glycoprotein G Ab, IgG: <0.91 index (ref 0.00–0.90)
HSV 2 IgG, Type Spec: 1.27 index — ABNORMAL HIGH (ref 0.00–0.90)

## 2023-01-30 LAB — NUSWAB VAGINITIS PLUS (VG+)
Candida albicans, NAA: NEGATIVE
Candida glabrata, NAA: NEGATIVE
Chlamydia trachomatis, NAA: NEGATIVE
Neisseria gonorrhoeae, NAA: NEGATIVE
Trich vag by NAA: NEGATIVE

## 2023-02-04 NOTE — Progress Notes (Signed)
[name]: Not sure if she already knows she has genital herpes.  If she does not it would be a very good discussion to have in person or on video visit if she desires.

## 2023-02-08 ENCOUNTER — Encounter: Payer: Self-pay | Admitting: Obstetrics and Gynecology

## 2023-02-08 NOTE — Progress Notes (Signed)
LVM to call back.

## 2023-02-22 ENCOUNTER — Ambulatory Visit: Payer: Managed Care, Other (non HMO) | Admitting: Family

## 2023-02-22 ENCOUNTER — Encounter: Payer: Self-pay | Admitting: Family

## 2023-02-22 VITALS — BP 138/88 | HR 72 | Temp 98.4°F | Ht 68.0 in | Wt 164.4 lb

## 2023-02-22 DIAGNOSIS — I159 Secondary hypertension, unspecified: Secondary | ICD-10-CM

## 2023-02-22 DIAGNOSIS — R9089 Other abnormal findings on diagnostic imaging of central nervous system: Secondary | ICD-10-CM | POA: Diagnosis not present

## 2023-02-22 DIAGNOSIS — R632 Polyphagia: Secondary | ICD-10-CM

## 2023-02-22 DIAGNOSIS — Z1231 Encounter for screening mammogram for malignant neoplasm of breast: Secondary | ICD-10-CM

## 2023-02-22 MED ORDER — BUPROPION HCL ER (XL) 150 MG PO TB24: 150.0000 mg | ORAL_TABLET | Freq: Every day | ORAL | 1 refills | Status: DC

## 2023-02-22 NOTE — Assessment & Plan Note (Addendum)
Trial Wellbutrin and titrate.  Counseled on side effects.  We did discuss phentermine however in the context of elevated blood pressure ,we opted to hold on starting this medication at this time due to cardiovascular risks.

## 2023-02-22 NOTE — Assessment & Plan Note (Addendum)
MRI brain partially empty sella turcica, which raises concern for idiopathic intracranial hypertension.   No HA's at this time. She has h/o aura which can obscure vision however no vision loss at this time.  Chronic tinnitus  Eye exam  02/21/13   she reports 'normal' at [facility]. Scanned to record however unable to tell if dilated eye exam. Await appointment with Dr Everlena Cooper 03/24/23 for recommendation. Consider ophthalmology consult for dilated eye exam.

## 2023-02-22 NOTE — Progress Notes (Signed)
Spoke to [place] at [facility] and she will fax over to [place], Fax # given

## 2023-02-22 NOTE — Progress Notes (Signed)
Assessment & Plan:  Overeating Assessment & Plan: Trial Wellbutrin and titrate.  Counseled on side effects.  We did discuss phentermine however in the context of elevated blood pressure ,we opted to hold on starting this medication at this time due to cardiovascular risks.  Orders: -     buPROPion HCl ER (XL); Take 1 tablet (150 mg total) by mouth daily. After 5 days, increase to  qam.  Dispense: 90 tablet; Refill: 1  Encounter for screening mammogram for malignant neoplasm of breast -     3D Screening Mammogram, Left and Right; Future -     3D Screening Mammogram w/Implants, Left and Right; Future  Abnormal brain MRI Assessment & Plan: MRI brain partially empty sella turcica, which raises concern for idiopathic intracranial hypertension.   No HA's at this time. She has h/o aura which can obscure vision however no vision loss at this time.  Chronic tinnitus  Eye exam  57/15/14   she reports 'normal' at [facility]. Scanned to record however unable to tell if dilated eye exam. Await appointment with Dr Everlena Cooper 03/24/23 for recommendation. Consider ophthalmology consult for dilated eye exam.      Secondary hypertension Assessment & Plan: Elevated today.  Reviewed previous blood pressures and they appear more labile.  Patient previously on lisinopril prior to duodenal switch, weight loss.  Discussed with patient it being more prudent to obtain more information and monitor blood pressure closely at home.  Consider antihypertensive once patient shares blood pressure log with me.       Return precautions given.   Risks, benefits, and alternatives of the medications and treatment plan prescribed today were discussed, and patient expressed understanding.   Education regarding symptom management and diagnosis given to patient on AVS either electronically or printed.  No follow-ups on file.  Rennie Plowman, FNP  Subjective:    Patient ID: [name], female    DOB:  10-18-1966, 57 y.o.   MRN: [id number]  CC: [name] is a 57 y.o. female who presents today for follow up.   HPI: Here today to discuss weight and desire to lose weight.  She would like to be 150lb for which she thinks she would feel her best and allow her a cushion if she were to gain weight.   She is working out [employer] and she has gained muscle .   When tired and anxious, she eats more which concerns her.  She feels more anxious when her employer asks for annual health information as insurance premium prices go up if she does not meet certain criteria.   She has lost 50% of body weight since duodenal switch in 2017.  At that time she was 330 pounds.  When she is in the doctor's office, she does have a blood pressure is elevated.  Previously on lisinopril for HTN    She has been on phentermine in the past.  She is interested in trying this again   She describes an  aura that can obscure vision in both eyes. HA overall less frequent.  Chronic tinnitus  Consult Dr Logan Bores dyspareunia. Positive HSV2  MRI brain partially empty sella turcica, which raises concern for idiopathic intracranial hypertension.   Eye exam  57/15/14   she report normal at [facility].  No h/o anorexia, bulimia seizure  No alcohol use  Appointment with Dr Everlena Cooper 57/15/24  Allergies: Ivp dye [iodinated contrast media], Latex, Tolmetin, Gallium nitrate, Insulin detemir, Nsaids, and Gadolinium derivatives Current Outpatient Medications  on File Prior to Visit  Medication Sig Dispense Refill   calcium citrate (CALCITRATE - DOSED IN MG ELEMENTAL CALCIUM) 950 (200 Ca) MG tablet Take 200 mg of elemental calcium by mouth daily.     CALCIUM PO Take by mouth.     Cholecalciferol (VITAMIN D3 PO) Take by mouth daily.     estradiol (ESTRACE) 0.1 MG/GM vaginal cream PLACE 1/4 APPLICATORFUL IN THE VAGINA AT BEDTIME 42.5 g 1   Multiple Vitamins-Minerals (BARIATRIC MULTIVITAMINS/IRON PO) Take by mouth.      mupirocin ointment (BACTROBAN) 2 % Apply 1 application topically 2 (two) times daily. X 5 days nose 30 g 0   Pediatric Multivitamins-Iron (FRUITY CHEWS/IRON PO) Take by mouth.     PREVIDENT 5000 BOOSTER PLUS 1.1 % PSTE Place onto teeth as directed.     saw palmetto 80 MG capsule Take 80 mg by mouth 2 (two) times daily.     sertraline (ZOLOFT) 25 MG tablet Take 25-50 mg by mouth daily as needed (itching).     VITAMIN A PO Take by mouth daily.     No current facility-administered medications on file prior to visit.    Review of Systems  Constitutional:  Negative for chills and fever.  Eyes:  Positive for visual disturbance.  Respiratory:  Negative for cough.   Cardiovascular:  Negative for chest pain and palpitations.  Gastrointestinal:  Negative for nausea and vomiting.  Neurological:  Positive for tremors and headaches.      Objective:    BP 138/88   Pulse 72   Temp 98.4 F (36.9 C) (Oral)   Ht 5\' 8"  (1.727 m)   Wt 164 lb 6.4 oz (74.6 kg)   LMP 07/11/2016 (Exact Date)   SpO2 97%   BMI 25.00 kg/m  BP Readings from Last 3 Encounters:  57/15/24 138/88  01/27/23 (!) 156/83  01/11/23 118/74   Wt Readings from Last 3 Encounters:  57/15/24 164 lb 6.4 oz (74.6 kg)  01/27/23 167 lb (75.8 kg)  01/11/23 170 lb (77.1 kg)    Physical Exam Vitals reviewed.  Constitutional:      Appearance: She is well-developed.  Eyes:     Conjunctiva/sclera: Conjunctivae normal.  Cardiovascular:     Rate and Rhythm: Normal rate and regular rhythm.     Pulses: Normal pulses.     Heart sounds: Normal heart sounds.  Pulmonary:     Effort: Pulmonary effort is normal.     Breath sounds: Normal breath sounds. No wheezing, rhonchi or rales.  Skin:    General: Skin is warm and dry.  Neurological:     Mental Status: She is alert.  Psychiatric:        Speech: Speech normal.        Behavior: Behavior normal.        Thought Content: Thought content normal.

## 2023-02-22 NOTE — Assessment & Plan Note (Signed)
Elevated today.  Reviewed previous blood pressures and they appear more labile.  Patient previously on lisinopril prior to duodenal switch, weight loss.  Discussed with patient it being more prudent to obtain more information and monitor blood pressure closely at home.  Consider antihypertensive once patient shares blood pressure log with me.

## 2023-02-22 NOTE — Patient Instructions (Addendum)
Monitor blood pressure at home and me 5-6 reading on separate days. Goal is less than 120/80, based on newest guidelines, however we certainly want to be less than 130/80;  if persistently higher, please make sooner follow up appointment so we can recheck you blood pressure and manage/ adjust medications.  Trial of wellbutrin and titrate as discussed.  Please let me know how you are doing

## 2023-03-23 NOTE — Progress Notes (Unsigned)
NEUROLOGY FOLLOW UP OFFICE NOTE  Itati Pagnozzi [id number]  Assessment/Plan:   Essential tremor - she does not exhibit any signs or symptoms of Parkinson's disease. 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 Balance instability - unclear etiology.  Exam was unremarkable.   Working up ptosis, check Myasthenia panel Tremors not severe enough to warrant medication.  Continue to monitor.   Follow up if symptoms worsen or if blood work positive.  Subjective:  VASHAWN RIGGEN is a 57 year old right-handed female with HLD, nonalcoholic fatty liver, s/p bariatric surgery and history of kidney stones who presents for tremor.  History supplemented by referring provider's note.  For 3 years she has noticed balance problems.  When she walks, she may start tilting to either side.  She has had infrequent episodes of falls.  Usually getting out of bed.  She will typically fall sideways or forward.  No associated dizziness.  Sometimes she will suddenly have freezing of gait while walking.  She needs to force herself to pick up her feet.  This past year, she has had head tremor.  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  Worse with fatigue.  Recent TSH was normal.  She is concerned because her father had Parkinson's disease.  She has no deficits in sense of smell.  No history of REM sleep behavior disorder.    On a couple of occasions over the past month, she has noticed a flashing light moving across her vision.  First time, it lasted 5 minutes.  The second time, it lasted 1 minute.  No headache.  She saw the eye doctor and did not have any retinal problem or other potential ocular cause.  She has history of migraines, but these are different. Migraines are preceded  by visual aura with halo that becomes so bright, she loses vision. Headache is a sharp pain across the forehead with photophobia. They occur about twice a month.   She has noticed that her right eyelid droops.  She has sensation of pressure in her eye.  This is not new.  It does not fluctuate.  Recent TSH was normal.    MRI of brain with and without contrast on 01/19/2023 personally reviewed revealed mild scattered T2 FLAIR non-enhancing hyperintensities within the bilateral cerebral white matter as well as partial empty sella.    PAST MEDICAL HISTORY: Past Medical History:  Diagnosis Date   Asthma due to seasonal allergies    no inhaler   History of diabetes mellitus, type II    per pt resolved after bariatric surgery 2017   History of hypertension    per pt resolved after bariatric surgery 2017   History of kidney stones    History of obstructive sleep apnea    per pt hx osa w/ cpap,  retested after bariatric surgery 2017 result negative   History of skin cancer    nose, not melanoma   Hydronephrosis, right    Hyperlipidemia    MRSA carrier    NAFL (nonalcoholic fatty liver)    Osteoporosis    Primary biliary cirrhosis (HCC)    Primary biliary cirrhosis (HCC)    Renal calculi    per CT 04-18-2019 bilateral , non-obstructive   S/P bariatric surgery 08/03/2016   duodenal switch w/ hh repair  Ureteral calculus, right    Wears glasses     MEDICATIONS: Current Outpatient Medications on File Prior to Visit  Medication Sig Dispense Refill   buPROPion (WELLBUTRIN XL) 150 MG 24 hr tablet Take 1 tablet (150 mg total) by mouth daily. After 5 days, increase to 300mg  qam. 90 tablet 1   calcium citrate (CALCITRATE - DOSED IN MG ELEMENTAL CALCIUM) 950 (200 Ca) MG tablet Take 200 mg of elemental calcium by mouth daily.     CALCIUM PO Take by mouth.     Cholecalciferol (VITAMIN D3 PO) Take by mouth daily.     estradiol (ESTRACE) 0.1 MG/GM vaginal cream PLACE 1/4 APPLICATORFUL IN THE VAGINA  AT BEDTIME 42.5 g 1   Multiple Vitamins-Minerals (BARIATRIC MULTIVITAMINS/IRON PO) Take by mouth.     mupirocin ointment (BACTROBAN) 2 % Apply 1 application topically 2 (two) times daily. X 5 days nose 30 g 0   Pediatric Multivitamins-Iron (FRUITY CHEWS/IRON PO) Take by mouth.     PREVIDENT 5000 BOOSTER PLUS 1.1 % PSTE Place onto teeth as directed.     saw palmetto 80 MG capsule Take 80 mg by mouth 2 (two) times daily.     sertraline (ZOLOFT) 25 MG tablet Take 25-50 mg by mouth daily as needed (itching).     VITAMIN A PO Take by mouth daily.     No current facility-administered medications on file prior to visit.    ALLERGIES: Allergies  Allergen Reactions   Ivp Dye [Iodinated Contrast Media]     Patient 'codes' with IV contrast   Latex Shortness Of Breath and Cough   Tolmetin Hives   Gallium Nitrate Hives    AND Gadolinium Derivatives    Insulin Detemir Hives and Swelling    Lump at injection site   Nsaids Hives    Per pt stated has to take ibuprofen with a antihistimine   Gadolinium Derivatives Hives    Pt c/o itching after contrast injection 03/15/19 @ 8:45am. [facility] Per Dr. Fredia Sorrow document this as allergic reaction.     FAMILY HISTORY: Family History  Problem Relation Age of Onset   Alcohol abuse Mother    Hyperlipidemia Mother    Hypertension Mother    Lymphoma Mother        primary site : brain   Leukemia Mother    Hyperlipidemia Father    Hypertension Father    Parkinson's disease Father    Drug abuse Sister    Cancer Maternal Grandmother        breast cancer   Breast cancer Maternal Grandmother 57   Liver cancer Paternal Grandfather    Kidney cancer Neg Hx    Prostate cancer Neg Hx       Objective:  Blood pressure (!) 145/79, pulse 74, height 5\' 8"  (1.727 m), weight 160 lb (72.6 kg), last menstrual period 07/11/2016, SpO2 95 %. General: No acute distress.  Patient appears well-groomed.   Head:  Normocephalic/atraumatic Eyes:  Fundi examined but not  visualized Neck: supple, no paraspinal tenderness, full range of motion Heart:  Regular rate and rhythm Neurological Exam: alert and oriented to person, place, and time.  Speech fluent and not dysarthric, language intact.  CN II-XII intact. Bulk and tone normal, no rigidity; muscle strength 5/5 throughout.  No tremor appreciated.  Bradykinesia absent.  Sensation to light touch intact.  Deep tendon reflexes 2+ throughout, toes downgoing.  Finger to nose testing intact.  Gait normal with upright posture, normal arm swing and stride, able to  turn and tandem walk.  Romberg negative.   Shon Millet, DO  CC: Rennie Plowman, FNP

## 2023-03-24 ENCOUNTER — Other Ambulatory Visit (INDEPENDENT_AMBULATORY_CARE_PROVIDER_SITE_OTHER): Payer: Managed Care, Other (non HMO)

## 2023-03-24 ENCOUNTER — Ambulatory Visit: Payer: Managed Care, Other (non HMO) | Admitting: Neurology

## 2023-03-24 ENCOUNTER — Encounter: Payer: Self-pay | Admitting: Neurology

## 2023-03-24 VITALS — BP 145/79 | HR 74 | Ht 68.0 in | Wt 160.0 lb

## 2023-03-24 DIAGNOSIS — R2681 Unsteadiness on feet: Secondary | ICD-10-CM

## 2023-03-24 DIAGNOSIS — G43109 Migraine with aura, not intractable, without status migrainosus: Secondary | ICD-10-CM | POA: Diagnosis not present

## 2023-03-24 DIAGNOSIS — G250 Essential tremor: Secondary | ICD-10-CM

## 2023-03-24 DIAGNOSIS — H02401 Unspecified ptosis of right eyelid: Secondary | ICD-10-CM

## 2023-03-24 NOTE — Patient Instructions (Signed)
I think you have essential tremor but I do not see any signs of Parkinson's on your exam The findings on brain MRI are nothing concerning.  They are mild, nonspecific and likely of no clinical significance.  They often can be seen in people with history of high blood pressure, diabetes, high cholesterol and migraine The episodes of flashing light may be migraine as well For the eyelid lag, will evaluate for myasthenia gravis Follow up if tremors get worse or if blood test is positive.

## 2023-03-26 ENCOUNTER — Encounter: Payer: Self-pay | Admitting: Family

## 2023-03-29 ENCOUNTER — Other Ambulatory Visit: Payer: Self-pay

## 2023-03-29 DIAGNOSIS — I10 Essential (primary) hypertension: Secondary | ICD-10-CM

## 2023-03-29 DIAGNOSIS — I159 Secondary hypertension, unspecified: Secondary | ICD-10-CM

## 2023-03-29 DIAGNOSIS — E559 Vitamin D deficiency, unspecified: Secondary | ICD-10-CM

## 2023-03-29 DIAGNOSIS — M810 Age-related osteoporosis without current pathological fracture: Secondary | ICD-10-CM

## 2023-03-29 NOTE — Telephone Encounter (Signed)
Labs ordered and printed off

## 2023-03-31 LAB — MYASTHENIA GRAVIS PROFILE: Acetylchol Block Ab: 9 % (ref 0–25)

## 2023-04-08 LAB — HEMOGLOBIN A1C: Est. average glucose Bld gHb Est-mCnc: 97 mg/dL

## 2023-04-08 LAB — COMPREHENSIVE METABOLIC PANEL
ALT: 19 IU/L (ref 0–32)
Albumin: 3.8 g/dL (ref 3.8–4.9)
BUN/Creatinine Ratio: 24 — ABNORMAL HIGH (ref 9–23)
Bilirubin Total: <0.2 mg/dL (ref 0.0–1.2)

## 2023-04-09 LAB — COMPREHENSIVE METABOLIC PANEL
AST: 24 IU/L (ref 0–40)
Total Protein: 6.5 g/dL (ref 6.0–8.5)
eGFR: 95 mL/min/{1.73_m2} (ref >59)

## 2023-04-09 LAB — VITAMIN E: Vitamin E (Alpha Tocopherol): 5.4 mg/L — ABNORMAL LOW (ref 7.0–25.1)

## 2023-04-09 LAB — HEMOGLOBIN A1C: Hgb A1c MFr Bld: 5 % (ref 4.8–5.6)

## 2023-04-09 LAB — VITAMIN A: Vitamin A: 15.8 ug/dL — ABNORMAL LOW (ref 20.1–62.0)

## 2023-04-09 LAB — VITAMIN C: Vitamin C: 0.9 mg/dL (ref 0.4–2.0)

## 2023-04-15 LAB — MYASTHENIA GRAVIS PROFILE
AChR Binding Ab, Serum: 0.04 nmol/L (ref 0.00–0.24)
AChR-modulating Ab: 0 % (ref 0–45)
Anti-striation Abs: NEGATIVE

## 2023-04-15 LAB — MUSK ANTIBODIES: MuSK Antibodies: <1 U/mL

## 2023-04-15 LAB — VITAMIN E: Vitamin E(Gamma Tocopherol): 0.4 mg/L — ABNORMAL LOW (ref 0.5–5.5)

## 2023-04-15 LAB — COMPREHENSIVE METABOLIC PANEL
CO2: 20 mmol/L (ref 20–29)
Chloride: 107 mmol/L — ABNORMAL HIGH (ref 96–106)
Sodium: 142 mmol/L (ref 134–144)

## 2023-04-15 LAB — VITAMIN D 25 HYDROXY (VIT D DEFICIENCY, FRACTURES): Vit D, 25-Hydroxy: 22.6 ng/mL — ABNORMAL LOW (ref 30.0–100.0)

## 2023-04-17 LAB — VITAMIN D 1,25 DIHYDROXY
Vitamin D 1, 25 (OH)2 Total: 98 pg/mL — ABNORMAL HIGH
Vitamin D2 1, 25 (OH)2: <10 pg/mL
Vitamin D3 1, 25 (OH)2: 97 pg/mL

## 2023-04-17 LAB — COMPREHENSIVE METABOLIC PANEL
Albumin/Globulin Ratio: 1.4 (ref 1.2–2.2)
Alkaline Phosphatase: 209 IU/L — ABNORMAL HIGH (ref 44–121)
BUN: 18 mg/dL (ref 6–24)
Calcium: 8.6 mg/dL — ABNORMAL LOW (ref 8.7–10.2)
Creatinine, Ser: 0.74 mg/dL (ref 0.57–1.00)
Globulin, Total: 2.7 g/dL (ref 1.5–4.5)
Glucose: 70 mg/dL (ref 70–99)
Potassium: 4.2 mmol/L (ref 3.5–5.2)

## 2023-04-17 LAB — VITAMIN K1, SERUM: VITAMIN K1: 0.2 ng/mL (ref 0.10–2.20)

## 2023-04-19 NOTE — Progress Notes (Signed)
Telephone call to patient no answer. LMOVM to call the office back.

## 2023-04-29 ENCOUNTER — Telehealth: Payer: Self-pay

## 2023-04-29 NOTE — Telephone Encounter (Signed)
Called about lab results MG panel normal patient wants to know next step to determine her eye drooping

## 2023-05-03 ENCOUNTER — Encounter: Payer: Self-pay | Admitting: Family

## 2023-05-03 ENCOUNTER — Encounter: Payer: Self-pay | Admitting: Family Medicine

## 2023-05-03 ENCOUNTER — Ambulatory Visit: Payer: Managed Care, Other (non HMO) | Admitting: Family Medicine

## 2023-05-03 VITALS — BP 118/74 | HR 73 | Temp 98.4°F | Ht 68.0 in | Wt 165.4 lb

## 2023-05-03 DIAGNOSIS — L237 Allergic contact dermatitis due to plants, except food: Secondary | ICD-10-CM

## 2023-05-03 MED ORDER — PREDNISONE 10 MG PO TABS
ORAL_TABLET | ORAL | 0 refills | Status: AC
Start: 2023-05-03 — End: 2023-05-15

## 2023-05-03 NOTE — Telephone Encounter (Signed)
Called patient and scheduled with Dr Birdie Sons

## 2023-05-03 NOTE — Assessment & Plan Note (Signed)
Rash is consistent with poison ivy dermatitis.  We will treat with steroid taper as outlined.  Discussed that I doubted that a 12-day course of prednisone with greatly affect her wound though if it started to seem to be getting worse she could discontinue the steroid.  Discussed risk of sleep issues and agitation with the steroids.  She will follow-up with her PCP for the midline wound as that is already scheduled for 1 week from now.

## 2023-05-03 NOTE — Progress Notes (Signed)
Marikay Alar, MD Phone: [phone]  [name] is a 57 y.o. female who presents today for poison ivy.  Notes onset of symptoms since 04/30/2023.  She went hiking and noticed this later.  Started on her right wrist and back and has spread to both legs and her face.  She has taken steroids in the past for this and tolerated them well.  She has been using Clorox and calamine to help with this.  She is concerned about taking steroids at this time given that she has a nonhealing wound in the middle of the surgical scar that has been present for about 3 months.  Notes her surgery was over a year ago.  Social History   Tobacco Use  Smoking Status Never  Smokeless Tobacco Never    Current Outpatient Medications on File Prior to Visit  Medication Sig Dispense Refill   buPROPion (WELLBUTRIN XL) 150 MG 24 hr tablet Take 1 tablet (150 mg total) by mouth daily. After 5 days, increase to 300mg  qam. 90 tablet 1   calcium citrate (CALCITRATE - DOSED IN MG ELEMENTAL CALCIUM) 950 (200 Ca) MG tablet Take 200 mg of elemental calcium by mouth daily.     CALCIUM PO Take by mouth.     Cholecalciferol (VITAMIN D3 PO) Take by mouth daily.     estradiol (ESTRACE) 0.1 MG/GM vaginal cream PLACE 1/4 APPLICATORFUL IN THE VAGINA AT BEDTIME 42.5 g 1   Multiple Vitamins-Minerals (BARIATRIC MULTIVITAMINS/IRON PO) Take by mouth.     mupirocin ointment (BACTROBAN) 2 % Apply 1 application topically 2 (two) times daily. X 5 days nose 30 g 0   Pediatric Multivitamins-Iron (FRUITY CHEWS/IRON PO) Take by mouth.     PREVIDENT 5000 BOOSTER PLUS 1.1 % PSTE Place onto teeth as directed.     saw palmetto 80 MG capsule Take 80 mg by mouth 2 (two) times daily.     sertraline (ZOLOFT) 25 MG tablet Take 25-50 mg by mouth daily as needed (itching).     ursodiol (ACTIGALL) 250 MG tablet Take 250 mg by mouth in the morning, at noon, in the evening, and at bedtime.     VITAMIN A PO Take by mouth daily.     vitamin E 180 MG  (400 UNITS) capsule Take 400 Units by mouth daily.     vitamin k 100 MCG tablet Take 100 mcg by mouth daily.     No current facility-administered medications on file prior to visit.     ROS see history of present illness  Objective  Physical Exam Vitals:   05/03/23 1523  BP: 118/74  Pulse: 73  Temp: 98.4 F (36.9 C)  SpO2: 98%    BP Readings from Last 3 Encounters:  05/03/23 118/74  03/24/23 (!) 145/79  02/22/23 138/88   Wt Readings from Last 3 Encounters:  05/03/23 165 lb 6.4 oz (75 kg)  03/24/23 160 lb (72.6 kg)  02/22/23 164 lb 6.4 oz (74.6 kg)    Physical Exam Abdominal:    Skin:    Comments: Erythematous vesicular rash on her right wrist, scattered erythematous papulovesicular rash on her legs bilaterally and around her mandible bilaterally      Assessment/Plan: Please see individual problem list.  Poison ivy Assessment & Plan: Rash is consistent with poison ivy dermatitis.  We will treat with steroid taper as outlined.  Discussed that I doubted that a 12-day course of prednisone with greatly affect her wound though if it started to seem to be getting  worse she could discontinue the steroid.  Discussed risk of sleep issues and agitation with the steroids.  She will follow-up with her PCP for the midline wound as that is already scheduled for 1 week from now.  Orders: -     predniSONE; Take 4 tablets (40 mg total) by mouth daily with breakfast for 3 days, THEN 3 tablets (30 mg total) daily with breakfast for 3 days, THEN 2 tablets (20 mg total) daily with breakfast for 3 days, THEN 1 tablet (10 mg total) daily with breakfast for 3 days.  Dispense: 30 tablet; Refill: 0    No follow-ups on file.   Marikay Alar, MD [facility]

## 2023-05-10 ENCOUNTER — Ambulatory Visit: Payer: Managed Care, Other (non HMO) | Admitting: Family

## 2023-05-10 ENCOUNTER — Encounter: Payer: Self-pay | Admitting: Family

## 2023-05-10 VITALS — BP 118/82 | HR 81 | Temp 98.2°F | Ht 68.0 in | Wt 168.8 lb

## 2023-05-10 DIAGNOSIS — M810 Age-related osteoporosis without current pathological fracture: Secondary | ICD-10-CM | POA: Diagnosis not present

## 2023-05-10 DIAGNOSIS — T8131XS Disruption of external operation (surgical) wound, not elsewhere classified, sequela: Secondary | ICD-10-CM | POA: Diagnosis not present

## 2023-05-10 NOTE — Patient Instructions (Signed)
Referral to neurosurgery.  Please let me know how you are doing

## 2023-05-10 NOTE — Progress Notes (Signed)
Assessment & Plan:  Wound dehiscence, surgical, sequela Assessment & Plan: Afebrile, nontoxic in appearance.  We discussed in depth concern for dehiscence over previous panniculectomy. Insurance doesn't cover for her to return to follow up to Dr [employer], Government social research officer, in [place]. Patient would like to be seen locally. Cultured wound again today. Reviewed prior CT a/p 09/2022.  Question of infection versus postsurgical change.  Patient has completed Bactrim.  We opted not to pursue further antibiotics until culture has resulted.  I placed a referral to general surgery in [place] to evaluate patient. I recommended repeat CT a/p to determine possible extent of fluid collection ( tunneling); patient declines in the absence of systemic or worsening features. Will follow.   Orders: -     Ambulatory referral to General Surgery -     WOUND CULTURE  Osteoporosis, unspecified osteoporosis type, unspecified pathological fracture presence -     DG Bone Density; Future     Return precautions given.   Risks, benefits, and alternatives of the medications and treatment plan prescribed today were discussed, and patient expressed understanding.   Education regarding symptom management and diagnosis given to patient on AVS either electronically or printed.  No follow-ups on file.  Rennie Plowman, FNP  Subjective:    Patient ID: [name], female    DOB: [birth year], 57 y.o.   MRN: [id number]  CC: [name] is a 57 y.o. female who presents today for follow up.   HPI: She complains of draining opening hole over previous surgical incision for panniculectomy over a year ago.    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 Negative for anaerobic bacteria.   She started bactroban ointment, cleaning the area and then bactrim which was prescribed by urology, taking BID x 3 weeks. Discharge has improved from prior.    No fever, chills, N, V, abdominal  pain.   History of panniculectomy Dr Charmian Muff in [place]     Recently treated by Dr Birdie Sons for poison ivy with prednisone  09/2022 CT a/p Stranding throughout the subcutaneous tissues with 2 ill-defined areas of soft tissue/fluid.  Minimally progressed from CT March 2023.  Likely reflect postsurgical change.  Superimposed infection cannot be excluded  Allergies: Ivp dye [iodinated contrast media], Latex, Tolmetin, Gallium nitrate, Insulin detemir, Nsaids, and Gadolinium derivatives Current Outpatient Medications on File Prior to Visit  Medication Sig Dispense Refill   calcium citrate (CALCITRATE - DOSED IN MG ELEMENTAL CALCIUM) 950 (200 Ca) MG tablet Take 200 mg of elemental calcium by mouth daily.     CALCIUM PO Take by mouth.     Cholecalciferol (VITAMIN D3 PO) Take by mouth daily.     estradiol (ESTRACE) 0.1 MG/GM vaginal cream PLACE 1/4 APPLICATORFUL IN THE VAGINA AT BEDTIME 42.5 g 1   Multiple Vitamins-Minerals (BARIATRIC MULTIVITAMINS/IRON PO) Take by mouth.     mupirocin ointment (BACTROBAN) 2 % Apply 1 application topically 2 (two) times daily. X 5 days nose 30 g 0   Pediatric Multivitamins-Iron (FRUITY CHEWS/IRON PO) Take by mouth.     predniSONE (DELTASONE) 10 MG tablet Take 4 tablets (40 mg total) by mouth daily with breakfast for 3 days, THEN 3 tablets (30 mg total) daily with breakfast for 3 days, THEN 2 tablets (20 mg total) daily with breakfast for 3 days, THEN 1 tablet (10 mg total) daily with breakfast for 3 days. 30 tablet 0   PREVIDENT 5000 BOOSTER PLUS 1.1 % PSTE Place onto teeth  as directed.     saw palmetto 80 MG capsule Take 80 mg by mouth 2 (two) times daily.     sertraline (ZOLOFT) 25 MG tablet Take 25-50 mg by mouth daily as needed (itching).     ursodiol (ACTIGALL) 250 MG tablet Take 250 mg by mouth in the morning, at noon, in the evening, and at bedtime.     VITAMIN A PO Take by mouth daily.     vitamin E 180 MG (400 UNITS) capsule Take 400 Units by mouth daily.      vitamin k 100 MCG tablet Take 100 mcg by mouth daily.     buPROPion (WELLBUTRIN XL) 150 MG 24 hr tablet Take 1 tablet (150 mg total) by mouth daily. After 5 days, increase to 300mg  qam. (Patient not taking: Reported on 05/10/2023) 90 tablet 1   No current facility-administered medications on file prior to visit.    Review of Systems  Constitutional:  Negative for chills and fever.  Respiratory:  Negative for cough.   Cardiovascular:  Negative for chest pain and palpitations.  Gastrointestinal:  Negative for abdominal pain, nausea and vomiting.  Skin:  Positive for wound.      Objective:    BP 118/82   Pulse 81   Temp 98.2 F (36.8 C) (Oral)   Ht 5\' 8"  (1.727 m)   Wt 168 lb 12.8 oz (76.6 kg)   LMP 07/11/2016 (Exact Date)   SpO2 96%   BMI 25.67 kg/m  BP Readings from Last 3 Encounters:  05/10/23 118/82  05/03/23 118/74  03/24/23 (!) 145/79   Wt Readings from Last 3 Encounters:  05/10/23 168 lb 12.8 oz (76.6 kg)  05/03/23 165 lb 6.4 oz (75 kg)  03/24/23 160 lb (72.6 kg)     Physical Exam Vitals reviewed.  Constitutional:      Appearance: She is well-developed.  Eyes:     Conjunctiva/sclera: Conjunctivae normal.  Cardiovascular:     Rate and Rhythm: Normal rate and regular rhythm.     Pulses: Normal pulses.     Heart sounds: Normal heart sounds.  Pulmonary:     Effort: Pulmonary effort is normal.     Breath sounds: Normal breath sounds. No wheezing, rhonchi or rales.  Abdominal:       Comments: Draining yellow to clear nonodorous fluid. It is not spontaneous but expressed by patient. There is firmness noted around area. No red streaks, bogginess, increased warmth.   Skin:    General: Skin is warm and dry.  Neurological:     Mental Status: She is alert.  Psychiatric:        Speech: Speech normal.        Behavior: Behavior normal.        Thought Content: Thought content normal.

## 2023-05-10 NOTE — Assessment & Plan Note (Signed)
Afebrile, nontoxic in appearance.  We discussed in depth concern for dehiscence over previous panniculectomy. Insurance doesn't cover for her to return to follow up to Dr [employer], Government social research officer, in [place]. Patient would like to be seen locally. Cultured wound again today. Reviewed prior CT a/p 09/2022.  Question of infection versus postsurgical change.  Patient has completed Bactrim.  We opted not to pursue further antibiotics until culture has resulted.  I placed a referral to general surgery in [place] to evaluate patient. I recommended repeat CT a/p to determine possible extent of fluid collection ( tunneling); patient declines in the absence of systemic or worsening features. Will follow.

## 2023-05-12 ENCOUNTER — Encounter: Payer: Self-pay | Admitting: Family

## 2023-05-13 LAB — WOUND CULTURE
MICRO NUMBER:: 15147240
SPECIMEN QUALITY:: ADEQUATE

## 2023-05-17 ENCOUNTER — Telehealth: Payer: Self-pay | Admitting: Family

## 2023-05-17 DIAGNOSIS — T8131XS Disruption of external operation (surgical) wound, not elsewhere classified, sequela: Secondary | ICD-10-CM

## 2023-05-17 NOTE — Telephone Encounter (Signed)
[name] What is status of surgery consult?  I have also placed a referral to plastic surgery to see if she could be seen sooner.    Please let me know

## 2023-05-19 ENCOUNTER — Telehealth: Payer: Self-pay | Admitting: Plastic Surgery

## 2023-05-19 ENCOUNTER — Other Ambulatory Visit: Payer: Self-pay | Admitting: Family

## 2023-05-19 DIAGNOSIS — T8131XS Disruption of external operation (surgical) wound, not elsewhere classified, sequela: Secondary | ICD-10-CM

## 2023-05-27 NOTE — Telephone Encounter (Signed)
I just received a call from [place] at [facility] and pt is scheduled for 07/19 .

## 2023-05-28 ENCOUNTER — Encounter: Payer: Managed Care, Other (non HMO) | Attending: Internal Medicine | Admitting: Physician Assistant

## 2023-05-28 DIAGNOSIS — K743 Primary biliary cirrhosis: Secondary | ICD-10-CM | POA: Diagnosis not present

## 2023-05-28 DIAGNOSIS — Y838 Other surgical procedures as the cause of abnormal reaction of the patient, or of later complication, without mention of misadventure at the time of the procedure: Secondary | ICD-10-CM | POA: Insufficient documentation

## 2023-05-28 DIAGNOSIS — L98492 Non-pressure chronic ulcer of skin of other sites with fat layer exposed: Secondary | ICD-10-CM | POA: Insufficient documentation

## 2023-05-28 DIAGNOSIS — R109 Unspecified abdominal pain: Secondary | ICD-10-CM | POA: Diagnosis not present

## 2023-05-28 DIAGNOSIS — Z9884 Bariatric surgery status: Secondary | ICD-10-CM | POA: Diagnosis not present

## 2023-05-28 DIAGNOSIS — T8131XA Disruption of external operation (surgical) wound, not elsewhere classified, initial encounter: Secondary | ICD-10-CM | POA: Insufficient documentation

## 2023-05-28 NOTE — Telephone Encounter (Signed)
Noted pt has had US  done

## 2023-05-31 NOTE — Progress Notes (Addendum)
[name], [name] ([id number]) [id number] Page 1 of 8 Visit Report for 05/28/2023 Allergy List Details Patient Name: Date of Service: [name], Sarah NN Stout. 05/28/2023 11:30 A M Medical Record Number: [id number] Patient Account Number: [id number] Date of Birth/Sex: Treating RN: Feb 23, 1966 (57 y.o. [name] Primary Care [name]: [name] Other Clinician: Referring [name]: Treating [name]/Extender: [name] in Treatment: 0 Allergies Active Allergies Gadolinium-Containing Contrast Media Iodinated Contrast Media tolmetin NSAIDS (Non-Steroidal Anti-Inflammatory Drug) gallium nitrate Allergy Notes Electronic Signature(s) Signed: 05/31/2023 11:03:51 AM By: Sarah Pax RN Entered By: [name] on 05/31/2023 11:03:50 -------------------------------------------------------------------------------- Arrival Information Details Patient Name: Date of Service: [name]. 05/28/2023 11:30 A M Medical Record Number: [id number] Patient Account Number: [id number] Date of Birth/Sex: Treating RN: 04/16/1966 (57 y.o. [name] Primary Care [name]: [name] Other Clinician: Referring [name]: Treating [name]/Extender: [name] in Treatment: 0 Visit Information Patient Arrived: Ambulatory Arrival Time: 11:30 Accompanied By: self Transfer Assistance: None Patient Identification Verified: Yes Secondary Verification Process CompletedALLYSE, [name] ([id number]) [id number] Page 2 of 8 Patient Requires Transmission-Based Precautions: No Patient Has Alerts: No Electronic Signature(s) Signed: 05/31/2023 10:58:43 AM By: Sarah Pax RN Entered By: [name] on 05/31/2023 10:58:43 -------------------------------------------------------------------------------- Clinic Level of Care Assessment Details Patient Name: Date of Service: [name], Sarah  NN Stout. 05/28/2023 11:30 A M Medical Record Number: [id number] Patient Account Number: [id number] Date of Birth/Sex: Treating RN: 03/25/1966 (57 y.o. [name] Primary Care Melodi Happel: [name] Other Clinician: Referring [name]: Treating [name]/Extender: [name] in Treatment: 0 Clinic Level of Care Assessment Items TOOL 2 Quantity Score X- 1 0 Use when only an EandM is performed on the INITIAL visit ASSESSMENTS - Nursing Assessment / Reassessment X- 1 20 General Physical Exam (combine w/ comprehensive assessment (listed just below) when performed on new pt. evals) X- 1 25 Comprehensive Assessment (HX, ROS, Risk Assessments, Wounds Hx, etc.) ASSESSMENTS - Wound and Skin A ssessment / Reassessment X - Simple Wound Assessment / Reassessment - one wound 1 5 []  - 0 Complex Wound Assessment / Reassessment - multiple wounds []  - 0 Dermatologic / Skin Assessment (not related to wound area) ASSESSMENTS - Ostomy and/or Continence Assessment and Care []  - 0 Incontinence Assessment and Management []  - 0 Ostomy Care Assessment and Management (repouching, etc.) PROCESS - Coordination of Care X - Simple Patient / Family Education for ongoing care 1 15 []  - 0 Complex (extensive) Patient / Family Education for ongoing care []  - 0 Staff obtains Chiropractor, Records, T Results / Process Orders est []  - 0 Staff telephones HHA, Nursing Homes / Clarify orders / etc []  - 0 Routine Transfer to another Facility (non-emergent condition) []  - 0 Routine Hospital Admission (non-emergent condition) X- 1 15 New Admissions / Manufacturing engineer / Ordering NPWT Apligraf, etc. , []  - 0 Emergency Hospital Admission (emergent condition) []  - 0 Simple Discharge Coordination []  - 0 Complex (extensive) Discharge Coordination PROCESS - Special Needs []  - 0 Pediatric / Minor Patient Management []  - 0 Isolation Patient Management []  - 0 Hearing / Language /  Visual special needs [name], [name] ([id number]) [id number] Page 3 of 8 []  - 0 Assessment of Community assistance (transportation, Stout/C planning, etc.) []  - 0 Additional assistance / Altered mentation []  - 0 Support Surface(s) Assessment (bed, cushion, seat, etc.) INTERVENTIONS - Wound Cleansing / Measurement X- 1 5 Wound Imaging (photographs - any number of wounds) []  -  0 Wound Tracing (instead of photographs) X- 1 5 Simple Wound Measurement - one wound []  - 0 Complex Wound Measurement - multiple wounds X- 1 5 Simple Wound Cleansing - one wound []  - 0 Complex Wound Cleansing - multiple wounds INTERVENTIONS - Wound Dressings X - Small Wound Dressing one or multiple wounds 1 10 []  - 0 Medium Wound Dressing one or multiple wounds []  - 0 Large Wound Dressing one or multiple wounds []  - 0 Application of Medications - injection INTERVENTIONS - Miscellaneous []  - 0 External ear exam []  - 0 Specimen Collection (cultures, biopsies, blood, body fluids, etc.) []  - 0 Specimen(s) / Culture(s) sent or taken to Lab for analysis []  - 0 Patient Transfer (multiple staff / Nurse, adult / Similar devices) []  - 0 Simple Staple / Suture removal (25 or less) []  - 0 Complex Staple / Suture removal (26 or more) []  - 0 Hypo / Hyperglycemic Management (close monitor of Blood Glucose) []  - 0 Ankle / Brachial Index (ABI) - do not check if billed separately Has the patient been seen at the hospital within the last three years: Yes Total Score: 105 Level Of Care: New/Established - Level 3 Electronic Signature(s) Signed: 06/01/2023 4:16:14 PM By: Sarah Pax RN Entered By: [name] on 05/31/2023 11:21:05 -------------------------------------------------------------------------------- Encounter Discharge Information Details Patient Name: Date of Service: [name]. 05/28/2023 11:30 A M Medical Record Number: [id number] Patient Account Number:  [id number] Date of Birth/Sex: Treating RN: 25-Oct-1966 (57 y.o. [name] Primary Care Anel Creighton: [name] Other Clinician: Referring Zell Doucette: Treating Donte Kary/Extender: [name] in Treatment: 0 Encounter Discharge Information Items Post Procedure Vitals Discharge Condition: Stable Temperature (F): 98.2 Ambulatory Status: Ambulatory Pulse (bpm): [address] Stout ([id number]) [phone].pdf Page 4 of 8 Discharge Destination: Home Respiratory Rate (breaths/min): 18 Transportation: Private Auto Blood Pressure (mmHg): 174/80 Accompanied By: self Schedule Follow-up Appointment: Yes Clinical Summary of Care: Notes late entry: data entered at this time due to national computer outage Electronic Signature(s) Signed: 05/31/2023 12:49:24 PM By: Sarah Pax RN Previous Signature: 05/31/2023 11:25:12 AM Version By: Sarah Pax RN Entered By: [name] on 05/31/2023 12:49:24 -------------------------------------------------------------------------------- Lower Extremity Assessment Details Patient Name: Date of Service: [name]. 05/28/2023 11:30 A M Medical Record Number: [id number] Patient Account Number: [id number] Date of Birth/Sex: Treating RN: Jul 28, 1966 (57 y.o. [name] Primary Care Jakara Blatter: [name] Other Clinician: Referring Ikran Patman: Treating Jalei Shibley/Extender: [name] in Treatment: 0 Electronic Signature(s) Signed: 05/31/2023 11:02:18 AM By: Sarah Pax RN Entered By: [name] on 05/31/2023 11:02:18 -------------------------------------------------------------------------------- Multi Wound Chart Details Patient Name: Date of Service: [name]. 05/28/2023 11:30 A M Medical Record Number: [id number] Patient Account Number: [id number] Date of Birth/Sex: Treating RN: 1966-09-14 (57 y.o. [name] Primary Care Zong Mcquarrie: [name]  Other Clinician: Referring Bonny Egger: Treating Nianna Igo/Extender: [name] in Treatment: 0 Vital Signs Height(in): 68 Pulse(bpm): 61 Weight(lbs): 175 Blood Pressure(mmHg): 174/80 Body Mass Index(BMI): 26.6 Temperature(F): 98.2 Respiratory Rate(breaths/min): 18 [1:Photos: No Photos] [N/A:N/A] [name], [name] ([id number]) [1:Proximal, Midline Abdomen - midline N/A Wound Location: Surgical Injury Wounding Event: Dehisced Wound Primary Etiology: 05/09/2022 Date Acquired: 0 Weeks of Treatment: Open Wound Status: No Wound Recurrence: 0.3x0.3x0.6 Measurements L x W x Stout (cm)  0.071 A (cm) : rea 0.042 Volume (cm) : 9 Starting Position 1 (o'clock): 12 Ending Position 1 (o'clock): 4 Maximum Distance 1 (cm): Yes Undermining: Full Thickness Without Exposed  Classification: Support Structures Medium Exudate Amount: Serosanguineous  Exudate Type: red, brown Exudate Color: Large (67-100%) Granulation Amount: Red Granulation Quality: Small (1-33%) Necrotic Amount: Fat Layer (Subcutaneous Tissue): Yes N/A Exposed Structures: Fascia: No Tendon: No Muscle: No Joint: No Bone: No None  Epithelialization:] [N/A:N/A N/A N/A N/A N/A N/A N/A N/A N/A N/A N/A N/A N/A N/A N/A N/A N/A N/A] Treatment Notes Electronic Signature(s) Signed: 05/31/2023 11:07:16 AM By: Sarah Pax RN Entered By: [name] on 05/31/2023 11:07:16 -------------------------------------------------------------------------------- Multi-Disciplinary Care Plan Details Patient Name: Date of Service: [name]. 05/28/2023 11:30 A M Medical Record Number: [id number] Patient Account Number: [id number] Date of Birth/Sex: Treating RN: 1966/08/13 (57 y.o. [name] Primary Care Jasmaine Rochel: [name] Other Clinician: Referring Capricia Serda: Treating Charan Prieto/Extender: [name] in Treatment: 0 Active Inactive Wound/Skin Impairment Nursing Diagnoses: Knowledge deficit related to  ulceration/compromised skin integrity Goals: Patient/caregiver will verbalize understanding of skin care regimen Date Initiated: 05/31/2023 Target Resolution Date: 06/28/2023 Goal Status: Active Ulcer/skin breakdown will have a volume reduction of 30% by week 4 Date Initiated: 05/31/2023 Target Resolution Date: 06/28/2023 Goal Status: Active Ulcer/skin breakdown will have a volume reduction of 50% by week 8 Date Initiated: 05/31/2023 Target Resolution Date: 07/29/2023 Goal Status: Active [name], [name] ([id number]) [phone].pdf Page 6 of 8 Ulcer/skin breakdown will have a volume reduction of 80% by week 12 Date Initiated: 05/31/2023 Target Resolution Date: 08/28/2023 Goal Status: Active Ulcer/skin breakdown will heal within 14 weeks Date Initiated: 05/31/2023 Target Resolution Date: 09/28/2023 Goal Status: Active Interventions: Assess patient/caregiver ability to obtain necessary supplies Assess patient/caregiver ability to perform ulcer/skin care regimen upon admission and as needed Assess ulceration(s) every visit Notes: Electronic Signature(s) Signed: 05/31/2023 11:23:22 AM By: Sarah Pax RN Entered By: [name] on 05/31/2023 11:23:22 -------------------------------------------------------------------------------- Pain Assessment Details Patient Name: Date of Service: [name]. 05/28/2023 11:30 A M Medical Record Number: [id number] Patient Account Number: [id number] Date of Birth/Sex: Treating RN: [birth year] (57 y.o. [name] Primary Care Bernabe Dorce: [name] Other Clinician: Referring Kimorah Ridolfi: Treating Suhas Estis/Extender: [name] in Treatment: 0 Active Problems Location of Pain Severity and Description of Pain Patient Has Paino No Site Locations Pain Management and Medication Current Pain Management: Electronic Signature(s) Signed: 05/31/2023 10:58:50 AM By: Sarah Pax RN Entered By: [name] on 05/31/2023 10:58:50 [name] ([id number]) [phone].pdf Page 7 of 8 -------------------------------------------------------------------------------- Patient/Caregiver Education Details Patient Name: Date of Service: [name], Sarah NN Stout. 7/19/2024andnbsp11:30 A M Medical Record Number: [id number] Patient Account Number: [id number] Date of Birth/Gender: Treating RN: 1966/08/10 (57 y.o. [name] Primary Care Physician: [name] Other Clinician: Referring Physician: Treating Physician/Extender: [name] in Treatment: 0 Education Assessment Education Provided To: Patient Education Topics Provided Welcome [facility]-New Patient Packet: o Handouts: Welcome [facility] o Methods: Explain/Verbal Responses: State content correctly Electronic Signature(s) Signed: 06/01/2023 4:16:14 PM By: Sarah Pax RN Entered By: [name] on 05/31/2023 11:23:54 -------------------------------------------------------------------------------- Wound Assessment Details Patient Name: Date of Service: [name]. 05/28/2023 11:30 A M Medical Record Number: [id number] Patient Account Number: [id number] Date of Birth/Sex: Treating RN: April 26, 1966 (57 y.o. [name] Primary Care Nola Botkins: [name] Other Clinician: Referring Valentina Alcoser: Treating Arbor Leer/Extender: [name] in Treatment: 0 Wound Status Wound Number: 1 Primary Etiology: Dehisced Wound Wound Location: Proximal, Midline Abdomen - midline Wound Status: Open Wounding Event: Surgical Injury Date Acquired: 05/09/2022  Weeks Of Treatment: 0 Clustered Wound: No Wound Measurements Length: (cm) 0.3 Width: (cm) 0.3 Depth: (cm) 0.6 Area: (cm) 0.0 [name], [name] ([id number]) Volume: (cm) 0. % Reduction in Area: % Reduction in Volume: Epithelialization: None 71 Undermining:  Yes [phone].pdf Page 8 of 8 042 Starting Position (o'clock): 9 Ending Position (o'clock): 12 Maximum Distance: (cm) 4 Wound Description Classification: Full Thickness Without Exposed Support Structures Exudate Amount: Medium Exudate Type: Serosanguineous Exudate Color: red, brown Foul Odor After Cleansing: No Slough/Fibrino Yes Wound Bed Granulation Amount: Large (67-100%) Exposed Structure Granulation Quality: Red Fascia Exposed: No Necrotic Amount: Small (1-33%) Fat Layer (Subcutaneous Tissue) Exposed: Yes Necrotic Quality: Adherent Slough Tendon Exposed: No Muscle Exposed: No Joint Exposed: No Bone Exposed: No Electronic Signature(s) Signed: 05/31/2023 11:02:07 AM By: Sarah Pax RN Entered By: [name] on 05/31/2023 11:02:07 -------------------------------------------------------------------------------- Vitals Details Patient Name: Date of Service: [name]. 05/28/2023 11:30 A M Medical Record Number: [id number] Patient Account Number: [id number] Date of Birth/Sex: Treating RN: 07-Dec-1965 (57 y.o. [name] Primary Care Akeela Busk: [name] Other Clinician: Referring Elray Dains: Treating Kristofor Michalowski/Extender: [name] in Treatment: 0 Vital Signs Time Taken: 11:31 Temperature (F): 98.2 Height (in): 68 Pulse (bpm): 61 Source: Stated Respiratory Rate (breaths/min): 18 Weight (lbs): 175 Blood Pressure (mmHg): 174/80 Source: Stated Reference Range: 80 - 120 mg / dl Body Mass Index (BMI): 26.6 Electronic Signature(s) Signed: 05/31/2023 10:59:24 AM By: Sarah Pax RN Entered By: [name] on 05/31/2023 10:59:24

## 2023-05-31 NOTE — Progress Notes (Signed)
SHELI, DORIN Stout ([id number]) [phone] [id number] Page 1 of 5 Visit Report for 05/28/2023 Abuse Risk Screen Details Patient Name: Date of Service: [name], Sarah NN Stout. 05/28/2023 11:30 A M Medical Record Number: [id number] Patient Account Number: [id number] Date of Birth/Sex: Treating RN: Jul 26, 1966 (57 y.o. Female) Yevonne Pax Primary Care Micah Barnier: Rennie Plowman Other Clinician: Referring Jontrell Bushong: Treating Tianna Baus/Extender: Orie Rout in Treatment: 0 Abuse Risk Screen Items Answer ABUSE RISK SCREEN: Has anyone close to you tried to hurt or harm you recentlyo No Do you feel uncomfortable with anyone in your familyo No Has anyone forced you do things that you didnt want to doo No Electronic Signature(s) Signed: 05/31/2023 11:05:18 AM By: Yevonne Pax RN Entered By: Yevonne Pax on 05/31/2023 11:05:17 -------------------------------------------------------------------------------- Activities of Daily Living Details Patient Name: Date of Service: [name], Sarah NN Stout. 05/28/2023 11:30 A M Medical Record Number: [id number] Patient Account Number: [id number] Date of Birth/Sex: Treating RN: 05-10-1966 (57 y.o. Female) Yevonne Pax Primary Care Ausar Georgiou: Rennie Plowman Other Clinician: Referring Tranell Wojtkiewicz: Treating Shanaiya Bene/Extender: Orie Rout in Treatment: 0 Activities of Daily Living Items Answer Activities of Daily Living (Please select one for each item) Drive Automobile Completely Able T Medications ake Completely Able Use T elephone Completely Able Care for Appearance Completely Able Use T oilet Completely Able Bath / Shower Completely Able Dress Self Completely Able Feed Self Completely Able Walk Completely Able Get In / Out Bed Completely Able Housework Completely CAMBER, NINH ([id number]) [phone] [id number] Page 2 of 5 Prepare Meals Completely Able Handle  Money Completely Able Shop for Self Completely Able Electronic Signature(s) Signed: 05/31/2023 11:05:47 AM By: Yevonne Pax RN Entered By: Yevonne Pax on 05/31/2023 11:05:46 -------------------------------------------------------------------------------- Education Screening Details Patient Name: Date of Service: Sarah Code NN Stout. 05/28/2023 11:30 A M Medical Record Number: [id number] Patient Account Number: [id number] Date of Birth/Sex: Treating RN: 08/12/1966 (57 y.o. Female) Yevonne Pax Primary Care Glorianna Gott: Rennie Plowman Other Clinician: Referring Dayon Witt: Treating Gabriell Daigneault/Extender: Orie Rout in Treatment: 0 Primary Learner Assessed: Patient Learning Preferences/Education Level/Primary Language Learning Preference: Explanation Highest Education Level: College or Above Preferred Language: English Cognitive Barrier Language Barrier: No Translator Needed: No Memory Deficit: No Emotional Barrier: No Cultural/Religious Beliefs Affecting Medical Care: No Physical Barrier Impaired Vision: No Impaired Hearing: No Decreased Hand dexterity: No Knowledge/Comprehension Knowledge Level: High Comprehension Level: High Ability to understand written instructions: High Ability to understand verbal instructions: High Motivation Anxiety Level: Anxious Cooperation: Cooperative Education Importance: Acknowledges Need Interest in Health Problems: Asks Questions Perception: Coherent Willingness to Engage in Self-Management High Activities: Readiness to Engage in Self-Management High Activities: Electronic Signature(s) Signed: 05/31/2023 11:06:22 AM By: Yevonne Pax RN Entered By: Yevonne Pax on 05/31/2023 11:06:22 [name] ([id number]) [id number] Page 3 of 5 -------------------------------------------------------------------------------- Fall Risk Assessment Details Patient Name: Date of Service: [name], Sarah  NN Stout. 05/28/2023 11:30 A M Medical Record Number: [id number] Patient Account Number: [id number] Date of Birth/Sex: Treating RN: 1966/05/13 (57 y.o. Female) Yevonne Pax Primary Care Adela Esteban: Rennie Plowman Other Clinician: Referring Shamyah Stantz: Treating Ajanee Buren/Extender: Orie Rout in Treatment: 0 Fall Risk Assessment Items Have you had 2 or more falls in the last 12 monthso 0 No Have you had any fall that resulted in injury in the last 12 monthso 0 No FALLS RISK SCREEN History of falling - immediate or within 3 months 0 No Secondary diagnosis (Do you have 2 or more medical diagnoseso) 0 No  Ambulatory aid None/bed rest/wheelchair/nurse 0 Yes Crutches/cane/walker 0 No Furniture 0 No Intravenous therapy Access/Saline/Heparin Lock 0 No Gait/Transferring Normal/ bed rest/ wheelchair 0 Yes Weak (short steps with or without shuffle, stooped but able to lift head while walking, may seek 0 No support from furniture) Impaired (short steps with shuffle, may have difficulty arising from chair, head down, impaired 0 No balance) Mental Status Oriented to own ability 0 Yes Electronic Signature(s) Signed: 05/31/2023 11:06:45 AM By: Yevonne Pax RN Entered By: Yevonne Pax on 05/31/2023 11:06:45 -------------------------------------------------------------------------------- Foot Assessment Details Patient Name: Date of Service: Sarah Code NN Stout. 05/28/2023 11:30 A M Medical Record Number: [id number] Patient Account Number: [id number] Date of Birth/Sex: Treating RN: 1966/07/20 (57 y.o. Female) Yevonne Pax Primary Care Angle Dirusso: Rennie Plowman Other Clinician: Referring Jenisis Harmsen: Treating Vianney Kopecky/Extender: Orie Rout in Treatment: 0 Foot Assessment Items Site Locations [name], [name] ([id number]) [phone] [id number] Page 4 of 5 + = Sensation present, - = Sensation absent, C = Callus, U = Ulcer R = Redness, W =  Warmth, M = Maceration, PU = Pre-ulcerative lesion F = Fissure, S = Swelling, Stout = Dryness Assessment Right: Left: Other Deformity: No No Prior Foot Ulcer: No No Prior Amputation: No No Charcot Joint: No No Ambulatory Status: Ambulatory Without Help Gait: Steady Electronic Signature(s) Signed: 05/31/2023 11:07:06 AM By: Yevonne Pax RN Entered By: Yevonne Pax on 05/31/2023 11:07:06 -------------------------------------------------------------------------------- Nutrition Risk Screening Details Patient Name: Date of Service: [name], Sarah NN Stout. 05/28/2023 11:30 A M Medical Record Number: [id number] Patient Account Number: [id number] Date of Birth/Sex: Treating RN: [birth year] (57 y.o. Female) Yevonne Pax Primary Care Desmen Schoffstall: Rennie Plowman Other Clinician: Referring Loriene Taunton: Treating Alexandru Moorer/Extender: Orie Rout in Treatment: 0 Height (in): 68 Weight (lbs): 175 Body Mass Index (BMI): 26.6 Nutrition Risk Screening Items Score Screening NUTRITION RISK SCREEN: I have an illness or condition that made me change the kind and/or amount of food I eat 0 No I eat fewer than two meals per day 0 No I eat few fruits and vegetables, or milk products 0 No I have three or more drinks of beer, liquor or wine almost every day 0 No I have tooth or mouth problems that make it hard for me to eat 0 No I don't always have enough money to buy the food I need 0 No [name], [name] ([id number]) [phone] [id number] Page 5 of 5 I eat alone most of the time 0 No I take three or more different prescribed or over-the-counter drugs a day 0 No Without wanting to, I have lost or gained 10 pounds in the last six months 0 No I am not always physically able to shop, cook and/or feed myself 0 No Nutrition Protocols Good Risk Protocol 0 No interventions needed Moderate Risk Protocol High Risk Proctocol Risk Level: Good Risk Score: 0 Electronic  Signature(s) Signed: 05/31/2023 11:06:56 AM By: Yevonne Pax RN Entered By: Yevonne Pax on 05/31/2023 11:06:56

## 2023-06-01 ENCOUNTER — Ambulatory Visit: Payer: Managed Care, Other (non HMO) | Admitting: Internal Medicine

## 2023-06-02 ENCOUNTER — Ambulatory Visit: Payer: Managed Care, Other (non HMO) | Admitting: Internal Medicine

## 2023-06-03 ENCOUNTER — Other Ambulatory Visit: Payer: Self-pay | Admitting: Nurse Practitioner

## 2023-06-03 ENCOUNTER — Encounter: Payer: Self-pay | Admitting: Nurse Practitioner

## 2023-06-03 ENCOUNTER — Ambulatory Visit: Payer: Managed Care, Other (non HMO) | Admitting: Nurse Practitioner

## 2023-06-03 ENCOUNTER — Ambulatory Visit
Admission: RE | Admit: 2023-06-03 | Discharge: 2023-06-03 | Disposition: A | Discharge status: Home / Self Care | Payer: Managed Care, Other (non HMO) | Source: Ambulatory Visit | Attending: Nurse Practitioner | Admitting: Nurse Practitioner

## 2023-06-03 VITALS — BP 130/78 | HR 59 | Temp 97.8°F | Ht 68.0 in | Wt 171.0 lb

## 2023-06-03 DIAGNOSIS — R1032 Left lower quadrant pain: Secondary | ICD-10-CM | POA: Insufficient documentation

## 2023-06-03 MED ORDER — SULFAMETHOXAZOLE-TRIMETHOPRIM 800-160 MG PO TABS: 1.0000 | ORAL_TABLET | Freq: Two times a day (BID) | ORAL | 0 refills | Status: AC

## 2023-06-03 NOTE — Progress Notes (Signed)
Established Patient Office Visit  Subjective:  Patient ID: [name], female    DOB: December 03, 1965  Age: 57 y.o. MRN: [id number]  CC:  Chief Complaint  Patient presents with   Wound Check    Painful, hot to  touch, swollen    HPI  [name] presents for wound check. She has been to the wound care on 05/28/23. She is now complaining  for pain in the abdominal area left lower quadrant stabbing pain started two days ago.  She has to take ibuprofen every 2-4 hours for pain.   Past Medical History:  Diagnosis Date   Asthma due to seasonal allergies    no inhaler   History of diabetes mellitus, type II    per pt resolved after bariatric surgery 2017   History of hypertension    per pt resolved after bariatric surgery 2017   History of kidney stones    History of obstructive sleep apnea    per pt hx osa w/ cpap,  retested after bariatric surgery 2017 result negative   History of skin cancer    nose, not melanoma   Hydronephrosis, right    Hyperlipidemia    MRSA carrier    NAFL (nonalcoholic fatty liver)    Osteoporosis    Primary biliary cirrhosis (HCC)    Primary biliary cirrhosis (HCC)    Renal calculi    per CT 04-18-2019 bilateral , non-obstructive   S/P bariatric surgery 08/03/2016   duodenal switch w/ hh repair   Ureteral calculus, right    Wears glasses     Past Surgical History:  Procedure Laterality Date   AUGMENTATION MAMMAPLASTY Bilateral 07/2021   Implants and lift   AUGMENTATION MAMMAPLASTY Bilateral 07/05/2022   Dr Kizzie Bane, replaced implants   BARIATRIC SURGERY  08-03-2016    dr Smitty Cords @[facility] ,  [place]   LAPAROSCOPY DUODENAL SWITCH AND HIATAL HERNIA REPAIR   BRACHIOPLASTY     BREAST BIOPSY Right 2017   FIBROCYSTIC CHANGES WITH CALCIFICATIONS   CESAREAN SECTION  x2  last one 09-10-2000   COLONOSCOPY WITH PROPOFOL N/A 09/11/2019   Procedure: COLONOSCOPY WITH PROPOFOL;  Surgeon: Wyline Mood, MD;  Location: [facility];  Service:  Gastroenterology;  Laterality: N/A;   CYSTOSCOPY WITH RETROGRADE PYELOGRAM, URETEROSCOPY AND STENT PLACEMENT Right 02-07-2012  @[facility]    CYSTOSCOPY WITH STENT PLACEMENT Right 09/10/2017   Procedure: CYSTOSCOPY WITH STENT PLACEMENT;  Surgeon: Riki Altes, MD;  Location: [facility];  Service: Urology;  Laterality: Right;   CYSTOSCOPY/URETEROSCOPY/HOLMIUM LASER/STENT PLACEMENT Right 04/24/2019   Procedure: CYSTOSCOPY/URETEROSCOPY/STENT PLACEMENT/ RIGHT RETROGRADE;  Surgeon: Jerilee Field, MD;  Location: [facility];  Service: Urology;  Laterality: Right;   DX LAPAROSCOPY/  EGD/  HIATAL HERNIA REPAIR CLOSURE OF INTERNAL MENSENTRY DEFECT  10-28-2017   @[facility] ,  [place]   EXTRACORPOREAL SHOCK WAVE LITHOTRIPSY N/A 01/30/2019   Procedure: EXTRACORPOREAL SHOCK WAVE LITHOTRIPSY (ESWL) LEFT/ POSSIBLE RIGHT;  Surgeon: Jerilee Field, MD;  Location: [facility];  Service: Urology;  Laterality: N/A;   EXTRACORPOREAL SHOCK WAVE LITHOTRIPSY Right 03/20/2019   Procedure: EXTRACORPOREAL SHOCK WAVE LITHOTRIPSY (ESWL);  Surgeon: Marcine Matar, MD;  Location: [facility];  Service: Urology;  Laterality: Right;   EXTRACORPOREAL SHOCK WAVE LITHOTRIPSY Left 10/30/2021   Procedure: EXTRACORPOREAL SHOCK WAVE LITHOTRIPSY (ESWL);  Surgeon: Crist Fat, MD;  Location[facility];  Service: Urology;  Laterality: Left;   LAPAROSCOPIC CHOLECYSTECTOMY  10-15-2016   @[facility]  ,  [place]   LIPOSUCTION     PANNICULECTOMY  PLACEMENT OF BREAST IMPLANTS Bilateral    TONSILLECTOMY AND ADENOIDECTOMY  child   URETEROSCOPY WITH HOLMIUM LASER LITHOTRIPSY Right 09/10/2017   Procedure: URETEROSCOPY WITH HOLMIUM LASER LITHOTRIPSY;  Surgeon: Riki Altes, MD;  Location: [facility];  Service: Urology;  Laterality: Right;    Family History  Problem Relation Age of Onset   Seizures Mother    Alcohol abuse Mother    Hyperlipidemia Mother    Hypertension Mother    Lymphoma Mother        primary site  : brain   Leukemia Mother    Parkinsonism Father    Hyperlipidemia Father    Hypertension Father    Parkinson's disease Father    Drug abuse Sister    Cancer Maternal Grandmother        breast cancer   Breast cancer Maternal Grandmother 56   Liver cancer Paternal Grandfather    Kidney cancer Neg Hx    Prostate cancer Neg Hx     Social History   Socioeconomic History   Marital status: Single    Spouse name: Not on file   Number of children: 2   Years of education: 14   Highest education level: Not on file  Occupational History   Occupation: Advice worker: [employer]   Occupation: Teaches for [employer]     Comment: Through [facility] [employer]   Occupation: Teaches microbiology   Occupation: Technical brewer    Comment: Microbiology - Weekend  Tobacco Use   Smoking status: Never   Smokeless tobacco: Never  Vaping Use   Vaping status: Never Used  Substance and Sexual Activity   Alcohol use: No    Alcohol/week: 0.0 standard drinks of alcohol   Drug use: Never   Sexual activity: Yes    Birth control/protection: None, Post-menopausal  Other Topics Concern   Not on file  Social History Narrative   Marvia Pickles grew up partly in [place] and then [place]. She lives in [place] with her two daughter. 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 She enjoys the outdoors, gardening.    Right handed   Two story home   Takes caffeine      Microbiologist      Social Determinants of Health   Financial Resource Strain: Not on file  Food Insecurity: Not on file  Transportation Needs: Not on file  Physical Activity: Not on file  Stress: Not on file  Social Connections: Not on file  Intimate Partner Violence: Not on file     Outpatient Medications Prior to Visit  Medication Sig Dispense Refill   buPROPion (WELLBUTRIN XL) 150 MG 24 hr tablet Take 1 tablet (150 mg total) by mouth daily. After 5 days, increase to 300mg  qam. (Patient not taking: Reported on  05/10/2023) 90 tablet 1   calcium citrate (CALCITRATE - DOSED IN MG ELEMENTAL CALCIUM) 950 (200 Ca) MG tablet Take 200 mg of elemental calcium by mouth daily.     CALCIUM PO Take by mouth.     Cholecalciferol (VITAMIN D3 PO) Take by mouth daily.     estradiol (ESTRACE) 0.1 MG/GM vaginal cream PLACE 1/4 APPLICATORFUL IN THE VAGINA AT BEDTIME 42.5 g 1   Multiple Vitamins-Minerals (BARIATRIC MULTIVITAMINS/IRON PO) Take by mouth.     mupirocin ointment (BACTROBAN) 2 % Apply 1 application topically 2 (two) times daily. X 5 days nose 30 g 0   Pediatric Multivitamins-Iron (FRUITY CHEWS/IRON PO) Take by mouth.     PREVIDENT 5000  BOOSTER PLUS 1.1 % PSTE Place onto teeth as directed.     saw palmetto 80 MG capsule Take 80 mg by mouth 2 (two) times daily.     sertraline (ZOLOFT) 25 MG tablet Take 25-50 mg by mouth daily as needed (itching).     ursodiol (ACTIGALL) 250 MG tablet Take 250 mg by mouth in the morning, at noon, in the evening, and at bedtime.     VITAMIN A PO Take by mouth daily.     vitamin E 180 MG (400 UNITS) capsule Take 400 Units by mouth daily.     vitamin k 100 MCG tablet Take 100 mcg by mouth daily.     No facility-administered medications prior to visit.    Allergies  Allergen Reactions   Ivp Dye [Iodinated Contrast Media]     Patient 'codes' with IV contrast   Latex Shortness Of Breath and Cough   Tolmetin Hives   Gallium Nitrate Hives    AND Gadolinium Derivatives    Insulin Detemir Hives and Swelling    Lump at injection site   Nsaids Hives    Per pt stated has to take ibuprofen with a antihistimine   Gadolinium Derivatives Hives    Pt c/o itching after contrast injection 03/15/19 @ 8:45am. [facility] Per Dr. Fredia Sorrow document this as allergic reaction.     ROS Review of Systems Negative unless indicated in HPI.    Objective:    Physical Exam Constitutional:      Appearance: Normal appearance.  HENT:     Head: Normocephalic.     Nose: Nose normal.     Mouth/Throat:      Mouth: Mucous membranes are moist.  Cardiovascular:     Rate and Rhythm: Normal rate and regular rhythm.     Pulses: Normal pulses.     Heart sounds: Normal heart sounds.  Pulmonary:     Effort: Pulmonary effort is normal.     Breath sounds: Normal breath sounds. No stridor. No wheezing.  Abdominal:     General: Bowel sounds are normal.     Tenderness: There is abdominal tenderness in the left upper quadrant and left lower quadrant.       Comments: Rigid, Inflammation and tenderness at pelvic area.   Neurological:     Mental Status: She is alert.     BP 130/78   Pulse (!) 59   Temp 97.8 F (36.6 C) (Oral)   Ht 5\' 8"  (1.727 m)   Wt 171 lb (77.6 kg)   LMP 07/11/2016 (Exact Date)   SpO2 98%   BMI 26.00 kg/m  Wt Readings from Last 3 Encounters:  06/03/23 171 lb (77.6 kg)  05/10/23 168 lb 12.8 oz (76.6 kg)  05/03/23 165 lb 6.4 oz (75 kg)     Health Maintenance  Topic Date Due   COVID-19 Vaccine (3 - 2023-24 season) 07/10/2022   INFLUENZA VACCINE  06/10/2023   PAP SMEAR-Modifier  09/19/2023   MAMMOGRAM  06/08/2024   Colonoscopy  09/10/2024   DTaP/Tdap/Td (2 - Td or Tdap) 02/25/2026   Hepatitis C Screening  Completed   HIV Screening  Completed   Zoster Vaccines- Shingrix  Completed   HPV VACCINES  Aged Out    There are no preventive care reminders to display for this patient.  Lab Results  Component Value Date   TSH 1.600 07/06/2022   Lab Results  Component Value Date   WBC 5.9 04/03/2022   HGB 10.4 (L) 04/03/2022   HCT  34.8 04/03/2022   MCV 80 04/03/2022   PLT 242 04/03/2022   Lab Results  Component Value Date   NA 142 04/07/2023   K 4.2 04/07/2023   CO2 20 04/07/2023   GLUCOSE 70 04/07/2023   BUN 18 04/07/2023   CREATININE 0.74 04/07/2023   BILITOT <0.2 04/07/2023   ALKPHOS 209 (H) 04/07/2023   AST 24 04/07/2023   ALT 19 04/07/2023   PROT 6.5 04/07/2023   ALBUMIN 3.8 04/07/2023   CALCIUM 8.6 (L) 04/07/2023   ANIONGAP 7 04/01/2021   EGFR  95 04/07/2023   GFR 94.92 09/03/2014   Lab Results  Component Value Date   CHOL 125 07/22/2021   Lab Results  Component Value Date   HDL 43 07/22/2021   Lab Results  Component Value Date   LDLCALC 69 07/22/2021   Lab Results  Component Value Date   TRIG 59 07/22/2021   Lab Results  Component Value Date   CHOLHDL 2.9 07/22/2021   Lab Results  Component Value Date   HGBA1C 5.0 04/07/2023      Assessment & Plan:  There are no diagnoses linked to this encounter.  Follow-up: No follow-ups on file.   Kara Dies, NP

## 2023-06-03 NOTE — Progress Notes (Addendum)
[name], [name] Stout ([id number]) [id number] Page 1 of 8 Visit Report for 05/28/2023 Chief Complaint Document Details Patient Name: Date of Service: [name], Sarah NN Stout. 05/28/2023 11:30 A M Medical Record Number: [id number] Patient Account Number: [id number] Date of Birth/Sex: Treating RN: August 31, 1966 (57 y.o. Freddy Finner Primary Care Provider: Rennie Stout Other Clinician: Referring Provider: Treating Provider/Extender: Orie Rout in Treatment: 0 Information Obtained from: Patient Chief Complaint Surgical wound abdominal region Electronic Signature(s) Signed: 06/07/2023 2:43:48 PM By: Allen Derry PA-C Entered By: Allen Derry on 06/07/2023 14:43:47 -------------------------------------------------------------------------------- Debridement Details Patient Name: Date of Service: Sarah Code NN Stout. 05/28/2023 11:30 A M Medical Record Number: [id number] Patient Account Number: [id number] Date of Birth/Sex: Treating RN: 1966/07/08 (57 y.o. Freddy Finner Primary Care Provider: Rennie Stout Other Clinician: Referring Provider: Treating Provider/Extender: Orie Rout in Treatment: 0 Debridement Performed for Assessment: Wound #1 Proximal,Midline Abdomen - midline Performed By: Physician Allen Derry, PA-C Debridement Type: Debridement Level of Consciousness (Pre-procedure): Awake and Alert Pre-procedure Verification/Time Out Yes - 12:00 Taken: Start Time: 12:00 Percent of Wound Bed Debrided: 100% T Area Debrided (cm): otal 0.07 Tissue and other material debrided: Viable, Non-Viable, Slough, Subcutaneous, Skin: Dermis , Skin: Epidermis, Slough Level: Skin/Subcutaneous Tissue Debridement Description: Excisional Instrument: Curette Bleeding: Minimum Hemostasis Achieved: Pressure End Time: 12:06 Procedural Pain: 0 Post Procedural Pain: 0 Response to Treatment: Procedure was tolerated well [name] ([id number]) [phone].pdf Page 2 of 8 Level of Consciousness (Post- Awake and Alert procedure): Post Debridement Measurements of Total Wound Length: (cm) 0.3 Width: (cm) 0.3 Depth: (cm) 0.6 Volume: (cm) 0.042 Character of Wound/Ulcer Post Debridement: Improved Post Procedure Diagnosis Same as Pre-procedure Electronic Signature(s) Signed: 05/31/2023 11:12:49 AM By: Yevonne Pax RN Signed: 06/03/2023 4:06:59 PM By: Allen Derry PA-C Entered By: Yevonne Pax on 05/31/2023 11:12:49 -------------------------------------------------------------------------------- HPI Details Patient Name: Date of Service: Sarah Code NN Stout. 05/28/2023 11:30 A M Medical Record Number: [id number] Patient Account Number: [id number] Date of Birth/Sex: Treating RN: Apr 03, 1966 (57 y.o. Freddy Finner Primary Care Provider: Rennie Stout Other Clinician: Referring Provider: Treating Provider/Extender: Orie Rout in Treatment: 0 History of Present Illness HPI Description: 05-28-2023 this is a note that is being put in late secondary to the fact that computers were down across the world due to a [employer] outage. Therefore there may be some lack of detail compared to what would normally be present in the notes. With that being said the patient tells me at this point that she has been having some issues today with a wound over the periumbilical region. Subsequently this is a previous site of surgery and she has unfortunately not been able to get this to completely heal. It stay closed for quite a bit of time in fact the initial surgery was a bariatric surgery 2 years ago. 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 It is unclear as to why this may have started giving her trouble now at this point. With that being said she has contacted the original surgeon who has refused to see her. She is also  been referred to plastic surgery who refused to see her locally. Subsequently I recommended the possibility of seeing if [name] or [name] could review her case apparently they already have and have refused to take on her case. This is a very difficult situation that to be honest I think is going to probably require some type of surgical intervention I am just unsure of exactly what is going to be necessary. I discussed all this with the patient today. Electronic Signature(s) Signed: 06/07/2023 2:46:58 PM By: Allen Derry PA-C Entered By: Allen Derry on 06/07/2023 14:46:57 Physical Exam Details -------------------------------------------------------------------------------- [name] ([id number]) [id number] Page 3 of 8 Patient Name: Date of Service: [name], [name] NN Stout. 05/28/2023 11:30 A M Medical Record Number: [id number] Patient Account Number: [id number] Date of Birth/Sex: Treating RN: November 20, 1965 (57 y.o. Freddy Finner Primary Care Provider: Rennie Stout Other Clinician: Referring Provider: Treating Provider/Extender: Orie Rout in Treatment: 0 Constitutional patient is hypertensive.. pulse regular and within target range for patient.Marland Kitchen respirations regular, non-labored and within target range for patient.Marland Kitchen temperature within target range for patient.. Well-nourished and well-hydrated in no acute distress. Eyes conjunctiva clear no eyelid edema noted. pupils equal round and reactive to light and accommodation. Ears, Nose, Mouth, and Throat no gross abnormality of ear auricles or external auditory canals. normal hearing  noted during conversation. mucus membranes moist. Respiratory normal breathing without difficulty. Musculoskeletal normal gait and posture. no significant deformity or arthritic changes, no loss or range of motion, no clubbing. Psychiatric this patient is able to make decisions and demonstrates good insight into disease process. Alert and Oriented x 3. pleasant and cooperative. Notes Upon inspection patient's wound bed actually showed signs of a pinpoint opening that actually does seem to be draining. I discussed with her that I did feel like we can try to remove some of the superficial tissue in order to clearway this and allow [place] to be able to put Hydrofera Blue rope then. I am not highly confident that this is good to be able to heal the wound and I discussed that with the patient today. Nonetheless I think it is probably the best thing we can do to attempt anything at this point. I really feel like she is going require some type of surgical intervention with regard to the wound including C. difficile appropriately. She voiced understanding but does want to try what we can do as she has been really refused at multiple locations where she has been referred to previous. Electronic Signature(s) Signed: 06/07/2023 2:47:47 PM By: Allen Derry PA-C Entered By: Allen Derry on 06/07/2023 14:47:47 -------------------------------------------------------------------------------- Physician Orders Details Patient Name: Date of Service: Sarah Code NN Stout. 05/28/2023 11:30 A M Medical Record Number: [id number] Patient Account Number: [id number] Date of Birth/Sex: Treating RN: 1966/08/04 (57 y.o. Freddy Finner Primary Care Provider: Rennie Stout Other Clinician: Referring Provider: Treating Provider/Extender: Orie Rout in Treatment: 0 Verbal / Phone Orders: No Diagnosis Coding Follow-up Appointments Return Appointment in 1 week. Bathing/ Shower/ Hygiene May shower;  gently cleanse wound with antibacterial soap, rinse and pat dry prior to dressing wounds Anesthetic (Use 'Patient Medications' Section for Anesthetic Order Entry) Lidocaine applied to wound bed Wound Treatment Wound #1 - Abdomen - midline Wound Laterality: Midline, Proximal Cleanser: Byram Ancillary Kit - 15 Day Supply 1 x Per Day/30 Days Discharge Instructions: Use supplies as instructed; Kit contains: (15) Saline Bullets; (15) 3x3 Gauze; 15 pr Gloves [name], [name] ([id number])  [phone].pdf Page 4 of 8 Prim Dressing: Hydrofera Blue Classic Foam Rope Dressing, 9x6 (mm/in) ary 1 x Per Day/30 Days Discharge Instructions: 1/2 diameter Secondary Dressing: (BORDER) Zetuvit Plus SILICONE BORDER Dressing 4x4 (in/in) 1 x Per Day/30 Days Discharge Instructions: Please do not put silicone bordered dressings under wraps. Use non-bordered dressing only. Electronic Signature(s) Signed: 05/31/2023 11:20:14 AM By: Yevonne Pax RN Signed: 06/03/2023 4:06:59 PM By: Allen Derry PA-C Entered By: Yevonne Pax on 05/31/2023 11:20:14 -------------------------------------------------------------------------------- Problem List Details Patient Name: Date of Service: Sarah Code NN Stout. 05/28/2023 11:30 A M Medical Record Number: [id number] Patient Account Number: [id number] Date of Birth/Sex: Treating RN: 1966-10-17 (57 y.o. Freddy Finner Primary Care Provider: Rennie Stout Other Clinician: Referring Provider: Treating Provider/Extender: Orie Rout in Treatment: 0 Active Problems ICD-10 Encounter Code Description Active Date MDM Diagnosis T81.31XA Disruption of external operation (surgical) wound, not elsewhere classified, 05/28/2023 No Yes initial encounter L98.492 Non-pressure chronic ulcer of skin of other sites with fat layer exposed 05/28/2023 No Yes Z98.84 Bariatric surgery status 05/28/2023 No Yes K74.3 Primary biliary cirrhosis 05/28/2023 No  Yes Inactive Problems Resolved Problems Electronic Signature(s) Signed: 06/07/2023 2:43:33 PM By: Allen Derry PA-C Entered By: Allen Derry on 06/07/2023 14:43:33 [name] ([id number]) [id number] Page 5 of 8 -------------------------------------------------------------------------------- Progress Note Details Patient Name: Date of Service: YATZIL, CLIPPINGER NN Stout. 05/28/2023 11:30 A M Medical Record Number: [id number] Patient Account Number: [id number] Date of Birth/Sex: Treating RN: 07/21/1966 (57 y.o. Freddy Finner Primary Care Provider: Rennie Stout Other Clinician: Referring Provider: Treating Provider/Extender: Orie Rout in Treatment: 0 Subjective Chief Complaint Information obtained from Patient Surgical wound abdominal region History of Present Illness (HPI) 05-28-2023 this is a note that is being put in late secondary to the fact that computers were down across the world due to a [employer] outage. Therefore there may be some lack of detail compared to what would normally be present in the notes. With that being said the patient tells me at this point that she has been having some issues today with a wound over the periumbilical region. Subsequently this is a previous site of surgery and she has unfortunately not been able to get this to completely heal. It stay closed for quite a bit of time in fact the initial surgery was a bariatric surgery 2 years ago. 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 It is unclear as to why this may have started giving her trouble now  at this point. With that being said she has contacted the original surgeon who has refused to see her. She is also been referred to plastic surgery who refused to see her locally. Subsequently I recommended the possibility of seeing if [name] or [name] could review her case apparently they already have and have refused to take on her case. This is a very difficult situation that to be honest I think is going to probably require some type of surgical intervention I am just unsure of exactly what is going to be necessary. I discussed all this with the patient today. Patient History  Information obtained from Patient. Allergies Gadolinium-Containing Contrast Media, Iodinated Contrast Media, tolmetin, NSAIDS (Non-Steroidal Anti-Inflammatory Drug), gallium nitrate Social History Never smoker, Marital Status - Divorced, Drug Use - No History, Caffeine Use - Daily. Medical History Cardiovascular Patient has history of Hypertension Review of Systems (ROS) Integumentary (Skin) Complains or has symptoms of Wounds. Objective Constitutional patient is hypertensive.. pulse regular and within target range for patient.Marland Kitchen respirations regular, non-labored and within target range for patient.Marland Kitchen temperature within target range for patient.. Well-nourished and well-hydrated in no acute distress. Vitals Time Taken: 11:31 AM, Height: 68 in, Source: Stated, Weight: 175 lbs, Source: Stated, BMI: 26.6, Temperature: 98.2 F, Pulse: 61 bpm, Respiratory Rate: 18 breaths/min, Blood Pressure: 174/80 mmHg. Eyes conjunctiva clear no eyelid edema noted. pupils equal round and reactive to light and accommodation. Ears, Nose, Mouth, and Throat no gross abnormality of ear auricles or external auditory canals. normal hearing noted during conversation. mucus membranes moist. Respiratory [name], [name] ([id number]) [id number] Page 6 of 8 normal breathing without  difficulty. Musculoskeletal normal gait and posture. no significant deformity or arthritic changes, no loss or range of motion, no clubbing. Psychiatric this patient is able to make decisions and demonstrates good insight into disease process. Alert and Oriented x 3. pleasant and cooperative. General Notes: Upon inspection patient's wound bed actually showed signs of a pinpoint opening that actually does seem to be draining. I discussed with her that I did feel like we can try to remove some of the superficial tissue in order to clearway this and allow [place] to be able to put Hydrofera Blue rope then. I am not highly confident that this is good to be able to heal the wound and I discussed that with the patient today. Nonetheless I think it is probably the best thing we can do to attempt anything at this point. I really feel like she is going require some type of surgical intervention with regard to the wound including C. difficile appropriately. She voiced understanding but does want to try what we can do as she has been really refused at multiple locations where she has been referred to previous. Integumentary (Hair, Skin) Wound #1 status is Open. Original cause of wound was Surgical Injury. The date acquired was: 05/09/2022. The wound is located on the Proximal,Midline Abdomen - midline. The wound measures 0.3cm length x 0.3cm width x 0.6cm depth; 0.071cm^2 area and 0.042cm^3 volume. There is Fat Layer (Subcutaneous Tissue) exposed. There is undermining starting at 9:00 and ending at 12:00 with a maximum distance of 4cm. There is a medium amount of serosanguineous drainage noted. There is large (67-100%) red granulation within the wound bed. There is a small (1-33%) amount of necrotic tissue within the wound bed including Adherent Slough. 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 There was a Excisional Skin/Subcutaneous Tissue Debridement with a total area of 0.07 sq cm performed by Allen Derry, PA-C. With the following instrument(s): Curette to remove Viable and Non- Viable tissue/material. Material removed includes Subcutaneous Tissue, Slough, Skin: Dermis, and Skin: Epidermis. No specimens were taken. A time out was conducted at 12:00, prior to the start of the procedure. A Minimum amount of bleeding was controlled with Pressure. The procedure was tolerated well with  a pain level of 0 throughout and a pain level of 0 following the procedure. Post Debridement Measurements: 0.3cm length x 0.3cm width x 0.6cm depth; 0.042cm^3 volume. Character of Wound/Ulcer Post Debridement is improved. Post procedure Diagnosis Wound #1: Same as Pre-Procedure Plan Follow-up Appointments: Return Appointment in 1 week. Bathing/ Shower/ Hygiene: May shower; gently cleanse wound with antibacterial soap, rinse and pat dry prior to dressing wounds Anesthetic (Use 'Patient Medications' Section for Anesthetic Order Entry): Lidocaine applied to wound bed WOUND #1: - Abdomen - midline Wound Laterality: Midline, Proximal Cleanser: Byram Ancillary Kit - 15 Day Supply 1 x Per Day/30 Days Discharge Instructions: Use supplies as instructed; Kit contains: (15) Saline Bullets; (15) 3x3 Gauze; 15 pr Gloves Prim Dressing: Hydrofera Blue Classic Foam Rope Dressing, 9x6 (mm/in) 1 x Per Day/30 Days ary Discharge Instructions: 1/2 diameter Secondary Dressing: (BORDER) Zetuvit Plus SILICONE BORDER Dressing 4x4 (in/in) 1 x Per Day/30 Days Discharge Instructions: Please do not put silicone bordered dressings under wraps. Use non-bordered dressing only. 1. Based on what I am seeing I did  actually go ahead and trim away some of the redundant tissue over the opening of the wound this allowed for a little bit more space to be able to get the Hydrofera Blue rope and we will get half of the rope in place and this should provide a good wick. With that being said I am unsure whether this can actually allow this to heal or not. 2. I am good recommend as well that the patient should continue to monitor for any signs of infection or worsening. I am hopeful that clearing out some of the fluid may help [place] to advance this wound towards closure but again I am unsure if this is going be sufficient and she is going require some type of surgical intervention that issue there is can be who will see her as so far every surgeon including the original that she is contacted has refused her case. We will see patient back for reevaluation in 1 week here in the clinic. If anything worsens or changes patient will contact our office for additional recommendations. [name], [name] ([id number]) [id number] Page 7 of 8 Electronic Signature(s) Signed: 06/07/2023 2:48:45 PM By: Allen Derry PA-C Entered By: Allen Derry on 06/07/2023 14:48:45 -------------------------------------------------------------------------------- ROS/PFSH Details Patient Name: Date of Service: Sarah Code NN Stout. 05/28/2023 11:30 A M Medical Record Number: [id number] Patient Account Number: [id number] Date of Birth/Sex: Treating RN: 1966/11/06 (57 y.o. Freddy Finner Primary Care Provider: Rennie Stout Other Clinician: Referring Provider: Treating Provider/Extender: Orie Rout in Treatment: 0 Information Obtained From Patient Integumentary (Skin) Complaints and Symptoms: Positive for: Wounds Cardiovascular Medical History: Positive for: Hypertension Immunizations Pneumococcal Vaccine: Received Pneumococcal Vaccination: No Implantable Devices None Family and Social  History Never smoker; Marital Status - Divorced; Drug Use: No History; Caffeine Use: Daily Electronic Signature(s) Signed: 06/01/2023 4:16:14 PM By: Yevonne Pax RN Signed: 06/03/2023 4:06:59 PM By: Allen Derry PA-C Entered By: Yevonne Pax on 05/31/2023 11:05:09 -------------------------------------------------------------------------------- SuperBill Details Patient Name: Date of Service: Sarah Code NN Stout. 05/28/2023 Medical Record Number: [id number] Patient Account Number: [id number] Date of Birth/Sex: Treating RN: 02-10-1966 (57 y.o. [place], Timme, [place] Stout ([id number]) [phone].pdf Page 8 of 8 Primary Care Provider: Rennie Stout Other Clinician: Referring Provider: Treating Provider/Extender: Orie Rout in Treatment: 0 Diagnosis Coding ICD-10 Codes Code Description T81.31XA Disruption of external operation (surgical) wound, not elsewhere classified, initial encounter L98.492 Non-pressure chronic  ulcer of skin of other sites with fat layer exposed Z98.84 Bariatric surgery status K74.3 Primary biliary cirrhosis Facility Procedures : CPT4 Code: 29528413 Description: 99213 - WOUND CARE VISIT-LEV 3 EST PT Modifier: Quantity: 1 : CPT4 Code: 24401027 Description: 11042 - DEB SUBQ TISSUE 20 SQ CM/< ICD-10 Diagnosis Description L98.492 Non-pressure chronic ulcer of skin of other sites with fat layer exposed Modifier: Quantity: 1 Physician Procedures : CPT4 Code Description Modifier [id number] WC PHYS LEVEL 3 NEW PT 25 ICD-10 Diagnosis Description T81.31XA Disruption of external operation (surgical) wound, not elsewhere classified, initial encounter L98.492 Non-pressure chronic ulcer of skin of other  sites with fat layer exposed Z98.84 Bariatric surgery status K74.3 Primary biliary cirrhosis Quantity: 1 : [id number] 11042 - WC PHYS SUBQ TISS 20 SQ CM ICD-10 Diagnosis Description L98.492 Non-pressure chronic ulcer of skin of  other sites with fat layer exposed Quantity: 1 Electronic Signature(s) Signed: 06/07/2023 2:49:00 PM By: Allen Derry PA-C Entered By: Allen Derry on 06/07/2023 14:48:59

## 2023-06-03 NOTE — Assessment & Plan Note (Signed)
Rigidity, inflammation  and  tenderness to the pelvic area and tenderness to the LUQ. CT abdomen and pelvis ordered for further evaluation.

## 2023-06-04 ENCOUNTER — Telehealth: Payer: Self-pay | Admitting: Family

## 2023-06-04 ENCOUNTER — Encounter: Payer: Managed Care, Other (non HMO) | Admitting: Internal Medicine

## 2023-06-04 DIAGNOSIS — T8131XA Disruption of external operation (surgical) wound, not elsewhere classified, initial encounter: Secondary | ICD-10-CM | POA: Diagnosis not present

## 2023-06-04 NOTE — Telephone Encounter (Signed)
FYI [facility]   Dr Ulice Bold,  [facility] you are doing well.   I wanted your advice in regards to this patient with abdominal infection and discharge from site of prior panniculectomy a year ago.   She is taking bactrim now.   I am concerned for sequela of wound dehiscence and patient declines to return to Dr Smitty Cords in [place].   You can see picture from office visit 05/10/23.   Wound culture grew heavy growth of Staphylococcus aureus  Pt has seen wound care 05/28/23 and I included provider on this note.   She has had a CT abdomen and pelvis yesterday with concern for mild increase of fluid collections.  Would your office be agreeable to seeing her?

## 2023-06-08 NOTE — Telephone Encounter (Signed)
[name], I have not heard from Dr Ulice Bold after I sent her a note.   Can you call and let them know pt has seen wound care.   Can she plastics now?     She had an abnormal CT abdomen 06/03/23 with postoperative changes in the abdominal wall soft tissue     I would like her to have consult as soon as possible.

## 2023-06-09 NOTE — Telephone Encounter (Signed)
Spoke to pt in regards to her wound pt stated that the wound is still draining and she thinks the 2nd one is opening now but she has appt with wound care on 06/11/23. Pt stated that she would let us know how things went when she leaves that appt.

## 2023-06-09 NOTE — Progress Notes (Signed)
IVANNAH, GRANITO D ([id number]) [id number] Page 1 of 9 Visit Report for 06/04/2023 Arrival Information Details Patient Name: Date of Service: [name], Sarah NN D. 06/04/2023 10:45 A M Medical Record Number: [id number] Patient Account Number: [id number] Date of Birth/Sex: Treating RN: 1966-03-07 (57 y.o. Freddy Finner Primary Care Nonnie Pickney: Rennie Plowman Other Clinician: Referring Hiroki Wint: Treating Jodey Burbano/Extender: Chauncey Mann, MICHA EL Valla Leaver in Treatment: 1 Visit Information Patient Arrived: Ambulatory Arrival Time: 10:44 Accompanied By: self Transfer Assistance: None Patient Identification Verified: Yes Secondary Verification Process Completed: Yes Patient Requires Transmission-Based Precautions: No Patient Has Alerts: No Electronic Signature(s) Signed: 06/09/2023 4:21:14 PM By: Yevonne Pax RN Entered By: Yevonne Pax on 06/04/2023 10:44:51 -------------------------------------------------------------------------------- Clinic Level of Care Assessment Details Patient Name: Date of Service: [name], Sarah NN D. 06/04/2023 10:45 A M Medical Record Number: [id number] Patient Account Number: [id number] Date of Birth/Sex: Treating RN: [birth year] (57 y.o. Freddy Finner Primary Care Estell Dillinger: Rennie Plowman Other Clinician: Referring Jahbari Repinski: Treating Jannine Abreu/Extender: Chauncey Mann, MICHA EL Valla Leaver in Treatment: 1 Clinic Level of Care Assessment Items TOOL 4 Quantity Score X- 1 0 Use when only an EandM is performed on FOLLOW-UP visit ASSESSMENTS - Nursing Assessment / Reassessment X- 1 10 Reassessment of Co-morbidities (includes updates in patient status) X- 1 5 Reassessment of Adherence to Treatment Plan ASSESSMENTS - Wound and Skin A ssessment / Reassessment X - Simple Wound Assessment / Reassessment - one wound 1 5 []  - 0 Complex Wound Assessment / Reassessment - multiple wounds [name], [name] ([id number])  [id number] Page 2 of 9 []  - 0 Dermatologic / Skin Assessment (not related to wound area) ASSESSMENTS - Focused Assessment []  - 0 Circumferential Edema Measurements - multi extremities []  - 0 Nutritional Assessment / Counseling / Intervention []  - 0 Lower Extremity Assessment (monofilament, tuning fork, pulses) []  - 0 Peripheral Arterial Disease Assessment (using hand held doppler) ASSESSMENTS - Ostomy and/or Continence Assessment and Care []  - 0 Incontinence Assessment and Management []  - 0 Ostomy Care Assessment and Management (repouching, etc.) PROCESS - Coordination of Care X - Simple Patient / Family Education for ongoing care 1 15 []  - 0 Complex (extensive) Patient / Family Education for ongoing care []  - 0 Staff obtains Chiropractor, Records, T Results / Process Orders est []  - 0 Staff telephones HHA, Nursing Homes / Clarify orders / etc []  - 0 Routine Transfer to another Facility (non-emergent condition) []  - 0 Routine Hospital Admission (non-emergent condition) []  - 0 New Admissions / Manufacturing engineer / Ordering NPWT Apligraf, etc. , []  - 0 Emergency Hospital Admission (emergent condition) X- 1 10 Simple Discharge Coordination []  - 0 Complex (extensive) Discharge Coordination PROCESS - Special Needs []  - 0 Pediatric / Minor Patient Management []  - 0 Isolation Patient Management []  - 0 Hearing / Language / Visual special needs []  - 0 Assessment of Community assistance (transportation, D/C planning, etc.) []  - 0 Additional assistance / Altered mentation []  - 0 Support Surface(s) Assessment (bed, cushion, seat, etc.) INTERVENTIONS - Wound Cleansing / Measurement X - Simple Wound Cleansing - one wound 1 5 []  - 0 Complex Wound Cleansing - multiple wounds []  - 0 Wound Imaging (photographs - any number of wounds) []  - 0 Wound Tracing (instead of photographs) []  - 0 Simple Wound Measurement - one wound []  - 0 Complex Wound  Measurement - multiple wounds INTERVENTIONS - Wound Dressings X - Small Wound Dressing one or multiple wounds 1 10 []  -  0 Medium Wound Dressing one or multiple wounds []  - 0 Large Wound Dressing one or multiple wounds []  - 0 Application of Medications - topical []  - 0 Application of Medications - injection INTERVENTIONS - Miscellaneous []  - 0 External ear exam []  - 0 Specimen Collection (cultures, biopsies, blood, body fluids, etc.) []  - 0 Specimen(s) / Culture(s) sent or taken to Lab for analysis [name], [name] ([id number]) [phone].pdf Page 3 of 9 []  - 0 Patient Transfer (multiple staff / Nurse, adult / Similar devices) []  - 0 Simple Staple / Suture removal (25 or less) []  - 0 Complex Staple / Suture removal (26 or more) []  - 0 Hypo / Hyperglycemic Management (close monitor of Blood Glucose) []  - 0 Ankle / Brachial Index (ABI) - do not check if billed separately X- 1 5 Vital Signs Has the patient been seen at the hospital within the last three years: Yes Total Score: 65 Level Of Care: New/Established - Level 2 Electronic Signature(s) Signed: 06/09/2023 4:21:14 PM By: Yevonne Pax RN Entered By: Yevonne Pax on 06/04/2023 11:13:22 -------------------------------------------------------------------------------- Encounter Discharge Information Details Patient Name: Date of Service: Sarah Code NN D. 06/04/2023 10:45 A M Medical Record Number: [id number] Patient Account Number: [id number] Date of Birth/Sex: Treating RN: May 06, 1966 (57 y.o. Freddy Finner Primary Care Jjesus Dingley: Rennie Plowman Other Clinician: Referring Annah Jasko: Treating Osamah Schmader/Extender: Chauncey Mann, MICHA EL Valla Leaver in Treatment: 1 Encounter Discharge Information Items Discharge Condition: Stable Ambulatory Status: Ambulatory Discharge Destination: Home Transportation: Private Auto Accompanied By: self Schedule Follow-up Appointment: Yes Clinical Summary  of Care: Electronic Signature(s) Signed: 06/09/2023 4:21:14 PM By: Yevonne Pax RN Entered By: Yevonne Pax on 06/04/2023 11:14:14 -------------------------------------------------------------------------------- Lower Extremity Assessment Details Patient Name: Date of Service: [name], Sarah NN D. 06/04/2023 10:45 A M Medical Record Number: [id number] Patient Account Number: [id number] Date of Birth/Sex: Treating RN: 12/20/1965 (57 y.o. Freddy Finner Primary Care Lanisha Stepanian: Rennie Plowman Other Clinician: Referring Gladie Gravette: Treating Brealynn Contino/Extender: [address] MICHA EL Kajuana, [place], [place] D ([id number]) [phone].pdf Page 4 of 9 Weeks in Treatment: 1 Electronic Signature(s) Signed: 06/09/2023 4:21:14 PM By: Yevonne Pax RN Entered By: Yevonne Pax on 06/04/2023 10:50:22 -------------------------------------------------------------------------------- Multi Wound Chart Details Patient Name: Date of Service: Sarah Code NN D. 06/04/2023 10:45 A M Medical Record Number: [id number] Patient Account Number: [id number] Date of Birth/Sex: Treating RN: 07-21-1966 (57 y.o. Freddy Finner Primary Care Villa Burgin: Rennie Plowman Other Clinician: Referring Hero Kulish: Treating Khalessi Blough/Extender: RO BSO N, MICHA EL Valla Leaver in Treatment: 1 Vital Signs Height(in): 68 Pulse(bpm): 57 Weight(lbs): 175 Blood Pressure(mmHg): 169/84 Body Mass Index(BMI): 26.6 Temperature(F): 97.9 Respiratory Rate(breaths/min): 18 [1:Photos:] [N/A:N/A] Proximal, Midline Abdomen - midline N/A N/A Wound Location: Surgical Injury N/A N/A Wounding Event: Dehisced Wound N/A N/A Primary Etiology: Hypertension N/A N/A Comorbid History: 05/09/2022 N/A N/A Date Acquired: 1 N/A N/A Weeks of Treatment: Open N/A N/A Wound Status: No N/A N/A Wound Recurrence: 0.3x0.3x0.5 N/A N/A Measurements L x W x D (cm) 0.071 N/A N/A A (cm) : rea 0.035 N/A N/A Volume  (cm) : 0.00% N/A N/A % Reduction in A rea: 16.70% N/A N/A % Reduction in Volume: Full Thickness Without Exposed N/A N/A Classification: Support Structures Medium N/A N/A Exudate Amount: Serosanguineous N/A N/A Exudate Type: red, brown N/A N/A Exudate Color: Large (67-100%) N/A N/A Granulation Amount: Red N/A N/A Granulation Quality: Small (1-33%) N/A N/A Necrotic Amount: Fat Layer (Subcutaneous Tissue): Yes N/A N/A Exposed Structures: Fascia: No Tendon:  No Muscle: No Joint: No Bone: No None N/A N/A Epithelialization: [name], [name] ([id number]) [id number] Page 5 of 9 Treatment Notes Electronic Signature(s) Signed: 06/09/2023 4:21:14 PM By: Yevonne Pax RN Entered By: Yevonne Pax on 06/04/2023 10:50:26 -------------------------------------------------------------------------------- Multi-Disciplinary Care Plan Details Patient Name: Date of Service: Sarah Code NN D. 06/04/2023 10:45 A M Medical Record Number: [id number] Patient Account Number: [id number] Date of Birth/Sex: Treating RN: [birth year] (57 y.o. Freddy Finner Primary Care Eythan Jayne: Rennie Plowman Other Clinician: Referring Jossue Rubenstein: Treating Paisley Grajeda/Extender: Chauncey Mann, MICHA EL Valla Leaver in Treatment: 1 Active Inactive Wound/Skin Impairment Nursing Diagnoses: Knowledge deficit related to ulceration/compromised skin integrity Goals: Patient/caregiver will verbalize understanding of skin care regimen Date Initiated: 05/31/2023 Target Resolution Date: 06/28/2023 Goal Status: Active Ulcer/skin breakdown will have a volume reduction of 30% by week 4 Date Initiated: 05/31/2023 Target Resolution Date: 06/28/2023 Goal Status: Active Ulcer/skin breakdown will have a volume reduction of 50% by week 8 Date Initiated: 05/31/2023 Target Resolution Date: 07/29/2023 Goal Status: Active Ulcer/skin breakdown will have a volume reduction of 80% by week 12 Date Initiated:  05/31/2023 Target Resolution Date: 08/28/2023 Goal Status: Active Ulcer/skin breakdown will heal within 14 weeks Date Initiated: 05/31/2023 Target Resolution Date: 09/28/2023 Goal Status: Active Interventions: Assess patient/caregiver ability to obtain necessary supplies Assess patient/caregiver ability to perform ulcer/skin care regimen upon admission and as needed Assess ulceration(s) every visit Notes: Electronic Signature(s) Signed: 06/09/2023 4:21:14 PM By: Yevonne Pax RN Entered By: Yevonne Pax on 06/04/2023 10:50:32 [name] ([id number]) [id number] Page 6 of 9 -------------------------------------------------------------------------------- Pain Assessment Details Patient Name: Date of Service: [name], Sarah NN D. 06/04/2023 10:45 A M Medical Record Number: [id number] Patient Account Number: [id number] Date of Birth/Sex: Treating RN: 11-May-1966 (57 y.o. Freddy Finner Primary Care Apple Dearmas: Rennie Plowman Other Clinician: Referring Zarya Lasseigne: Treating Ziya Coonrod/Extender: Chauncey Mann, MICHA EL Valla Leaver in Treatment: 1 Active Problems Location of Pain Severity and Description of Pain Patient Has Paino No Site Locations Pain Management and Medication Current Pain Management: Electronic Signature(s) Signed: 06/09/2023 4:21:14 PM By: Yevonne Pax RN Entered By: Yevonne Pax on 06/04/2023 10:45:40 -------------------------------------------------------------------------------- Patient/Caregiver Education Details Patient Name: Date of Service: Sarah Code NN D. 7/26/2024andnbsp10:45 A M Medical Record Number: [id number] Patient Account Number: [id number] Date of Birth/Gender: Treating RN: 10-27-1966 (57 y.o. Freddy Finner Primary Care Physician: Rennie Plowman Other Clinician: Referring Physician: Treating Physician/Extender: Chauncey Mann, MICHA EL Valla Leaver in Treatment: 1 [name], [name] ([id number])  [id number] Page 7 of 9 Education Assessment Education Provided To: Patient Education Topics Provided Wound/Skin Impairment: Handouts: Caring for Your Ulcer Methods: Explain/Verbal Responses: State content correctly Electronic Signature(s) Signed: 06/09/2023 4:21:14 PM By: Yevonne Pax RN Entered By: Yevonne Pax on 06/04/2023 10:50:41 -------------------------------------------------------------------------------- Wound Assessment Details Patient Name: Date of Service: Sarah Code NN D. 06/04/2023 10:45 A M Medical Record Number: [id number] Patient Account Number: [id number] Date of Birth/Sex: Treating RN: Apr 15, 1966 (57 y.o. Freddy Finner Primary Care Dilana Mcphie: Rennie Plowman Other Clinician: Referring Murna Backer: Treating Annslee Tercero/Extender: Chauncey Mann, MICHA EL Valla Leaver in Treatment: 1 Wound Status Wound Number: 1 Primary Etiology: Dehisced Wound Wound Location: Proximal, Midline Abdomen - midline Wound Status: Open Wounding Event: Surgical Injury Comorbid History: Hypertension Date Acquired: 05/09/2022 Weeks Of Treatment: 1 Clustered Wound: No Photos Wound Measurements Length: (cm) 0.3 Width: (cm) 0.3 Depth: (cm) 0.5 Area: (cm) 0.071 Volume: (cm) 0.035 % Reduction in Area: 0% % Reduction in Volume: 16.7%  Epithelialization: None Tunneling: No Undermining: No Wound Description Classification: Full Thickness Without Exposed Support Exudate Amount: Medium Exudate Type: Serosanguineous [name], [name] ([id number]) Exudate Color: red, brown Structures Foul Odor After Cleansing: No Slough/Fibrino Yes [phone].pdf Page 8 of 9 Wound Bed Granulation Amount: Large (67-100%) Exposed Structure Granulation Quality: Red Fascia Exposed: No Necrotic Amount: Small (1-33%) Fat Layer (Subcutaneous Tissue) Exposed: Yes Necrotic Quality: Adherent Slough Tendon Exposed: No Muscle Exposed: No Joint Exposed:  No Bone Exposed: No Treatment Notes Wound #1 (Abdomen - midline) Wound Laterality: Midline, Proximal Cleanser Byram Ancillary Kit - 15 Day Supply Discharge Instruction: Use supplies as instructed; Kit contains: (15) Saline Bullets; (15) 3x3 Gauze; 15 pr Gloves Peri-Wound Care Topical Primary Dressing Hydrofera Blue Classic Foam Rope Dressing, 9x6 (mm/in) Discharge Instruction: 1/2 diameter Secondary Dressing (BORDER) Zetuvit Plus SILICONE BORDER Dressing 4x4 (in/in) Discharge Instruction: Please do not put silicone bordered dressings under wraps. Use non-bordered dressing only. Secured With Compression Wrap Compression Stockings Facilities manager) Signed: 06/09/2023 4:21:14 PM By: Yevonne Pax RN Entered By: Yevonne Pax on 06/04/2023 10:50:12 -------------------------------------------------------------------------------- Vitals Details Patient Name: Date of Service: Sarah Code NN D. 06/04/2023 10:45 A M Medical Record Number: [id number] Patient Account Number: [id number] Date of Birth/Sex: Treating RN: 05-08-1966 (57 y.o. Freddy Finner Primary Care Johanna Matto: Rennie Plowman Other Clinician: Referring Philicia Heyne: Treating Datron Brakebill/Extender: Chauncey Mann, MICHA EL Valla Leaver in Treatment: 1 Vital Signs Time Taken: 10:44 Temperature (F): 97.9 Height (in): 68 Pulse (bpm): 57 Weight (lbs): 175 Respiratory Rate (breaths/min): 18 Body Mass Index (BMI): 26.6 Blood Pressure (mmHg): 169/84 Reference Range: 80 - 120 mg / dl Electronic Signature(s) Signed: 06/09/2023 4:21:14 PM By: Yevonne Pax RN Lillia Mountain, Signed: 06/09/2023 4:21:14 PM By: Yevonne Pax RN [name] D ([id number]) [id number] Page 9 of 9 Entered By: Yevonne Pax on 06/04/2023 10:45:23

## 2023-06-09 NOTE — Progress Notes (Signed)
[name] Stout ([id number]) [id number] Page 1 of 6 Visit Report for 06/04/2023 Debridement Details Patient Name: Date of Service: [name], [name]. 06/04/2023 10:45 A M Medical Record Number: [id number] Patient Account Number: [id number] Date of Birth/Sex: Treating RN: November 04, 1966 (57 y.o. Freddy Finner Primary Care Provider: Rennie Plowman Other Clinician: Referring Provider: Treating Provider/Extender: Chauncey Mann, MICHA EL Valla Leaver in Treatment: 1 Debridement Performed for Assessment: Wound #1 Proximal,Midline Abdomen - midline Performed By: Physician Maxwell Caul, MD Debridement Type: Chemical/Enzymatic/Mechanical Agent Used: saline gauze Level of Consciousness (Pre-procedure): Awake and Alert Pre-procedure Verification/Time Out No Taken: Start Time: 11:20 Percent of Wound Bed Debrided: Instrument: Other : saline gauze Bleeding: Minimum Hemostasis Achieved: Pressure End Time: 11:21 Procedural Pain: 0 Post Procedural Pain: 0 Response to Treatment: Procedure was tolerated well Level of Consciousness (Post- Awake and Alert procedure): Post Debridement Measurements of Total Wound Length: (cm) 0.3 Width: (cm) 0.3 Depth: (cm) 0.5 Volume: (cm) 0.035 Character of Wound/Ulcer Post Debridement: Improved Post Procedure Diagnosis Same as Pre-procedure Electronic Signature(s) Signed: 06/04/2023 2:26:28 PM By: Baltazar Najjar MD Signed: 06/09/2023 4:21:14 PM By: Yevonne Pax RN Entered By: Yevonne Pax on 06/04/2023 11:22:20 -------------------------------------------------------------------------------- HPI Details Patient Name: Date of Service: [name]. 06/04/2023 10:45 A M Medical Record Number: [id number] Patient Account Number: [id number] [name] ([id number]) [id number] Page 2 of 6 Date of Birth/Sex: Treating RN: [birth year] (57 y.o. Freddy Finner Primary Care Provider: Other  Clinician: Rennie Plowman Referring Provider: Treating Provider/Extender: Chauncey Mann, MICHA EL Valla Leaver in Treatment: 1 History of Present Illness HPI Description: 7/26 this patient was admitted to the clinic last week. She has a small probing wound in the central abdomen and epigastric area. We used Hydrofera Blue packing to this area. The patient has been changed changing the dressing herself. I believe she is also on antibiotics although I do not see any culture results currently. She is followed carefully by her primary doctor [name]. She had a CT scan of her abdomen on 7/25 which showed an enlarging fluid collection in the right lower quadrant compared to a previous film currently 14.5 x 10.3. There is also a fluid collection measuring 3.8 x 2 in the area above the umbilicus which is the area where her small probing wound is. [name] referred the patient to [name] of plastic surgery in [place]. Apparently the patient states she has attempted to get in with general surgery in [place] but they will not see her. Her original panniculectomy surgery was done by [name] in [place] however the patient refuses to go back to see him or his practice. According to the patient she has been unable to find another Editor, commissioning either general or plastics. The patient had a gastric bypass several years ago and then subsequently went on to have a panniculectomy. Somehow she has been left with a small open wound in the central part of her abdomen just caudal to the umbilicus. She has the fluid collections noted above. She says she continues to have drainage out of this small area. She has abdominal pain which feels like a tearing sensation in her lower mid abdomen. She does not have any acute sounding GI issues however. Electronic Signature(s) Signed: 06/04/2023 2:26:28 PM By: Baltazar Najjar MD Entered By: Baltazar Najjar on 06/04/2023  13:45:01 -------------------------------------------------------------------------------- Physical Exam Details Patient Name: Date of Service: [name]. 06/04/2023 10:45 A M Medical Record Number:  [id number] Patient Account Number: [id number] Date of Birth/Sex: Treating RN: 1965/12/21 (57 y.o. Freddy Finner Primary Care Provider: Rennie Plowman Other Clinician: Referring Provider: Treating Provider/Extender: RO BSO Dorris Carnes, MICHA EL Valla Leaver in Treatment: 1 Constitutional Patient is hypertensive.. Pulse regular and within target range for patient.Marland Kitchen Respirations regular, non-labored and within target range.. Temperature is normal and within the target range for the patient.Marland Kitchen appears in no distress. Gastrointestinal (GI) What feels like ballotable fluid in the right lower quadrant but nontender. No other masses are felt.. Notes Wound exam; the patient has a small probing wound about 5 cm caudal to the umbilicus. I probed this wound this does not connect with any fluid collection I could elicit it is not particularly tender there is no purulent drainage. No bleeding is noted. No abdominal wall bruising and no particular tenderness Electronic Signature(s) Signed: 06/04/2023 2:26:28 PM By: Baltazar Najjar MD Entered By: Baltazar Najjar on 06/04/2023 13:46:43 [name] ([id number]) [id number] Page 3 of 6 -------------------------------------------------------------------------------- Physician Orders Details Patient Name: Date of Service: [name], [name]. 06/04/2023 10:45 A M Medical Record Number: [id number] Patient Account Number: [id number] Date of Birth/Sex: Treating RN: [birth year] (57 y.o. Freddy Finner Primary Care Provider: Rennie Plowman Other Clinician: Referring Provider: Treating Provider/Extender: RO BSO Dorris Carnes, MICHA EL Valla Leaver in Treatment: 1 Verbal / Phone Orders: No Diagnosis Coding Follow-up  Appointments Return Appointment in 1 week. Bathing/ Shower/ Hygiene May shower; gently cleanse wound with antibacterial soap, rinse and pat dry prior to dressing wounds Anesthetic (Use 'Patient Medications' Section for Anesthetic Order Entry) Lidocaine applied to wound bed Wound Treatment Wound #1 - Abdomen - midline Wound Laterality: Midline, Proximal Cleanser: Byram Ancillary Kit - 15 Day Supply (DME) (Generic) 1 x Per Day/30 Days Discharge Instructions: Use supplies as instructed; Kit contains: (15) Saline Bullets; (15) 3x3 Gauze; 15 pr Gloves Prim Dressing: Hydrofera Blue Classic Foam Rope Dressing, 9x6 (mm/in) ary 1 x Per Day/30 Days Discharge Instructions: 1/2 diameter Secondary Dressing: (BORDER) Zetuvit Plus SILICONE BORDER Dressing 4x4 (in/in) (DME) (Generic) 1 x Per Day/30 Days Discharge Instructions: Please do not put silicone bordered dressings under wraps. Use non-bordered dressing only. Electronic Signature(s) Signed: 06/04/2023 2:26:28 PM By: Baltazar Najjar MD Signed: 06/09/2023 4:21:14 PM By: Yevonne Pax RN Entered By: Yevonne Pax on 06/04/2023 11:25:16 -------------------------------------------------------------------------------- Problem List Details Patient Name: Date of Service: [name]. 06/04/2023 10:45 A M Medical Record Number: [id number] Patient Account Number: [id number] Date of Birth/Sex: Treating RN: 25-Mar-1966 (57 y.o. Freddy Finner Primary Care Provider: Rennie Plowman Other Clinician: Referring Provider: Treating Provider/Extender: Chauncey Mann, MICHA EL Valla Leaver in Treatment: [address] [name], [name] ([id number]) [id number] Page 4 of 6 ICD-10 Encounter Code Description Active Date MDM Diagnosis S31.609S Unspecified open wound of abdominal wall, unspecified quadrant with 06/04/2023 No Yes penetration into peritoneal cavity, sequela Inactive Problems Resolved Problems Electronic  Signature(s) Signed: 06/04/2023 2:26:28 PM By: Baltazar Najjar MD Entered By: Baltazar Najjar on 06/04/2023 13:40:39 -------------------------------------------------------------------------------- Progress Note Details Patient Name: Date of Service: [name]. 06/04/2023 10:45 A M Medical Record Number: [id number] Patient Account Number: [id number] Date of Birth/Sex: Treating RN: 09-Oct-1966 (57 y.o. Freddy Finner Primary Care Provider: Rennie Plowman Other Clinician: Referring Provider: Treating Provider/Extender: Chauncey Mann, MICHA EL Valla Leaver in Treatment: 1 Subjective History of Present Illness (HPI) 7/26 this patient was admitted to the clinic last week. She has a small  probing wound in the central abdomen and epigastric area. We used Hydrofera Blue packing to this area. The patient has been changed changing the dressing herself. I believe she is also on antibiotics although I do not see any culture results currently. She is followed carefully by her primary doctor [name]. She had a CT scan of her abdomen on 7/25 which showed an enlarging fluid collection in the right lower quadrant compared to a previous film currently 14.5 x 10.3. There is also a fluid collection measuring 3.8 x 2 in the area above the umbilicus which is the area where her small probing wound is. [name] referred the patient to [name] of plastic surgery in [place]. Apparently the patient states she has attempted to get in with general surgery in [place] but they will not see her. Her original panniculectomy surgery was done by [name] in [place] however the patient refuses to go back to see him or his practice. According to the patient she has been unable to find another Editor, commissioning either general or plastics. The patient had a gastric bypass several years ago and then subsequently went on to have a panniculectomy. Somehow she has been left with a small open wound  in the central part of her abdomen just caudal to the umbilicus. She has the fluid collections noted above. She says she continues to have drainage out of this small area. She has abdominal pain which feels like a tearing sensation in her lower mid abdomen. She does not have any acute sounding GI issues however. Objective Constitutional Patient is hypertensive.. Pulse regular and within target range for patient.Marland Kitchen Respirations regular, non-labored and within target range.. Temperature is normal and within the target range for the patient.Marland Kitchen appears in no distress. Vitals Time Taken: 10:44 AM, Height: 68 in, Weight: 175 lbs, BMI: 26.6, Temperature: 97.9 F, Pulse: 57 bpm, Respiratory Rate: 18 breaths/min, Blood Pressure: 169/84 mmHg. Gastrointestinal (GI) What feels like ballotable fluid in the right lower quadrant but nontender. No other masses are felt.. General Notes: Wound exam; the patient has a small probing wound about 5 cm caudal to the umbilicus. I probed this wound this does not connect with any [name], [name] ([id number]) [id number] Page 5 of 6 fluid collection I could elicit it is not particularly tender there is no purulent drainage. No bleeding is noted. No abdominal wall bruising and no particular tenderness Integumentary (Hair, Skin) Wound #1 status is Open. Original cause of wound was Surgical Injury. The date acquired was: 05/09/2022. The wound has been in treatment 1 weeks. The wound is located on the Proximal,Midline Abdomen - midline. The wound measures 0.3cm length x 0.3cm width x 0.5cm depth; 0.071cm^2 area and 0.035cm^3 volume. There is Fat Layer (Subcutaneous Tissue) exposed. There is no tunneling or undermining noted. There is a medium amount of serosanguineous drainage noted. There is large (67-100%) red granulation within the wound bed. There is a small (1-33%) amount of necrotic tissue within the wound bed including  Adherent Slough. Assessment Active Problems ICD-10 Unspecified open wound of abdominal wall, unspecified quadrant with penetration into peritoneal cavity, sequela Procedures Wound #1 Pre-procedure diagnosis of Wound #1 is a Dehisced Wound located on the Proximal,Midline Abdomen - midline . There was a Chemical/Enzymatic/Mechanical debridement performed by Maxwell Caul, MD. With the following instrument(s): saline gauze. Other agent used was saline gauze. A Minimum amount of bleeding was controlled with Pressure. The procedure was tolerated well with a pain level of 0 throughout and  a pain level of 0 following the procedure. Post Debridement Measurements: 0.3cm length x 0.3cm width x 0.5cm depth; 0.035cm^3 volume. Character of Wound/Ulcer Post Debridement is improved. Post procedure Diagnosis Wound #1: Same as Pre-Procedure Plan Follow-up Appointments: Return Appointment in 1 week. Bathing/ Shower/ Hygiene: May shower; gently cleanse wound with antibacterial soap, rinse and pat dry prior to dressing wounds Anesthetic (Use 'Patient Medications' Section for Anesthetic Order Entry): Lidocaine applied to wound bed WOUND #1: - Abdomen - midline Wound Laterality: Midline, Proximal Cleanser: Byram Ancillary Kit - 15 Day Supply (DME) (Generic) 1 x Per Day/30 Days Discharge Instructions: Use supplies as instructed; Kit contains: (15) Saline Bullets; (15) 3x3 Gauze; 15 pr Gloves Prim Dressing: Hydrofera Blue Classic Foam Rope Dressing, 9x6 (mm/in) 1 x Per Day/30 Days ary Discharge Instructions: 1/2 diameter Secondary Dressing: (BORDER) Zetuvit Plus SILICONE BORDER Dressing 4x4 (in/in) (DME) (Generic) 1 x Per Day/30 Days Discharge Instructions: Please do not put silicone bordered dressings under wraps. Use non-bordered dressing only. 1. The patient has collections of fluid as noted. I suspect these are probably seromatous whether or not they are infected I am unsure. She is currently  on antibiotics 2. We used Hydrofera Blue packing rope and backing border foam. 3. It appears that these are going to need to be drained. This would seem to be more of a general surgery issue to me and then the question is would they address this or refer to interventional radiology. 4. The wound on her abdomen may be an exit site for draining clear fluid/seroma. Unlikely that this is going to heal without draining of the fluid. She is on antibiotics I do not believe that this is an acute issue currently. Electronic Signature(s) Signed: 06/04/2023 2:26:28 PM By: Baltazar Najjar MD Entered By: Baltazar Najjar on 06/04/2023 13:48:33 [name] ([id number]) [id number] Page 6 of 6 -------------------------------------------------------------------------------- SuperBill Details Patient Name: Date of Service: [name], [name]. 06/04/2023 Medical Record Number: [id number] Patient Account Number: [id number] Date of Birth/Sex: Treating RN: 1966/08/05 (57 y.o. Freddy Finner Primary Care Provider: Rennie Plowman Other Clinician: Referring Provider: Treating Provider/Extender: Chauncey Mann, MICHA EL Valla Leaver in Treatment: 1 Diagnosis Coding ICD-10 Codes Code Description S31.609S Unspecified open wound of abdominal wall, unspecified quadrant with penetration into peritoneal cavity, sequela Facility Procedures : CPT4 Code: [id number] Description: 63016 - WOUND CARE VISIT-LEV 2 EST PT Modifier: Quantity: 1 Physician Procedures : CPT4 Code Description Modifier [id number] - WC PHYS LEVEL 3 - EST PT ICD-10 Diagnosis Description S31.609S Unspecified open wound of abdominal wall, unspecified quadrant with penetration into peritoneal cavi Quantity: 1 ty, sequela Electronic Signature(s) Signed: 06/04/2023 2:26:28 PM By: Baltazar Najjar MD Entered By: Baltazar Najjar on 06/04/2023 13:48:56

## 2023-06-10 ENCOUNTER — Ambulatory Visit
Admission: RE | Admit: 2023-06-10 | Discharge: 2023-06-10 | Disposition: A | Discharge status: Home / Self Care | Payer: Managed Care, Other (non HMO) | Source: Ambulatory Visit | Attending: Family | Admitting: Family

## 2023-06-10 DIAGNOSIS — Z1231 Encounter for screening mammogram for malignant neoplasm of breast: Secondary | ICD-10-CM | POA: Insufficient documentation

## 2023-06-11 ENCOUNTER — Encounter: Payer: Managed Care, Other (non HMO) | Attending: Physician Assistant | Admitting: Physician Assistant

## 2023-06-11 DIAGNOSIS — K743 Primary biliary cirrhosis: Secondary | ICD-10-CM | POA: Diagnosis not present

## 2023-06-11 DIAGNOSIS — Z9884 Bariatric surgery status: Secondary | ICD-10-CM | POA: Insufficient documentation

## 2023-06-11 DIAGNOSIS — T8131XA Disruption of external operation (surgical) wound, not elsewhere classified, initial encounter: Secondary | ICD-10-CM | POA: Insufficient documentation

## 2023-06-11 DIAGNOSIS — L98492 Non-pressure chronic ulcer of skin of other sites with fat layer exposed: Secondary | ICD-10-CM | POA: Diagnosis not present

## 2023-06-11 NOTE — Progress Notes (Addendum)
JEANEAN, HOLLETT Stout ([id number]) [id number] Page 1 of 6 Visit Report for 06/11/2023 Chief Complaint Document Details Patient Name: Date of Service: [name], Sarah NN Stout. 06/11/2023 10:45 A M Medical Record Number: [id number] Patient Account Number: [id number] Date of Birth/Sex: Treating RN: Apr 05, 1966 (57 y.o. Freddy Finner Primary Care Provider: Rennie Plowman Other Clinician: Referring Provider: Treating Provider/Extender: Orie Rout in Treatment: 2 Information Obtained from: Patient Chief Complaint Surgical wound abdominal region Electronic Signature(s) Signed: 06/11/2023 10:44:15 AM By: Allen Derry PA-C Entered By: Allen Derry on 06/11/2023 10:44:15 -------------------------------------------------------------------------------- HPI Details Patient Name: Date of Service: Sarah Code NN Stout. 06/11/2023 10:45 A M Medical Record Number: [id number] Patient Account Number: [id number] Date of Birth/Sex: Treating RN: [birth year] (57 y.o. Freddy Finner Primary Care Provider: Rennie Plowman Other Clinician: Referring Provider: Treating Provider/Extender: Orie Rout in Treatment: 2 History of Present Illness HPI Description: 05-28-2023 this is a note that is being put in late secondary to the fact that computers were down across the world due to a [employer] outage. Therefore there may be some lack of detail compared to what would normally be present in the notes. With that being said the patient tells me at this point that she has been having some issues today with a wound over the periumbilical region. Subsequently this is a previous site of surgery and she has unfortunately not been able to get this to completely heal. It stay closed for quite a bit of time in fact the initial surgery was a bariatric surgery 2 years ago. 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 It is unclear as to why this may have started giving her trouble now at this point. With that being said she has contacted the original surgeon who has refused to see her. She is also been referred to plastic surgery who refused to see her locally. Subsequently I recommended the possibility of seeing if [name] or [name] could review her case apparently they already have and have refused to take on her case. This is a very difficult situation that to be honest I think is going to probably require some type of surgical intervention I am just unsure of exactly what is going to be necessary. I discussed all this with the patient today. 7/26 this patient was admitted to the clinic last week. She has a small probing wound in the central abdomen and epigastric area. We used Hydrofera Blue packing to this area. The patient has been changed changing the dressing herself. I believe she is also on antibiotics although I do not see any culture results currently. She is followed carefully by her primary doctor [name]. She had a CT scan of her abdomen on 7/25 which showed an enlarging fluid collection in the right lower quadrant compared to a previous film currently 14.5 x 10.3. There is also a fluid collection measuring 3.8 x 2 in the area above the umbilicus which is the area where her small probing wound is. [name] referred the patient to [name] of plastic surgery in [place]. Apparently the patient [name],  [name] ([id number]) [id number] Page 2 of 6 states she has attempted to get in with general surgery in [place] but they will not see her. Her original  panniculectomy surgery was done by [name] in [place] however the patient refuses to go back to see him or his practice. According to the patient she has been unable to find another Editor, commissioning either general or plastics. The patient had a gastric bypass several years ago and then subsequently went on to have a panniculectomy. Somehow she has been left with a small open wound in the central part of her abdomen just caudal to the umbilicus. She has the fluid collections noted above. She says she continues to have drainage out of this small area. She has abdominal pain which feels like a tearing sensation in her lower mid abdomen. She does not have any acute sounding GI issues however. 06-11-2023 upon evaluation today patient appears to be doing much better in regard to the overall external appearance of her wound that we know that she still has the abscesses internally. Unfortunately based on what we are seeing right now would not even need to be able to get a sense Hydrofera Blue rope dressing down into this wound which is quite unfortunate. Electronic Signature(s) Signed: 06/11/2023 1:44:59 PM By: Allen Derry PA-C Entered By: Allen Derry on 06/11/2023 13:44:59 -------------------------------------------------------------------------------- Physical Exam Details Patient Name: Date of Service: Sarah Code NN Stout. 06/11/2023 10:45 A M Medical Record Number: [id number] Patient Account Number: [id number] Date of Birth/Sex: Treating RN: 20-May-1966 (57 y.o. Freddy Finner Primary Care Provider: Rennie Plowman Other Clinician: Referring Provider: Treating Provider/Extender: Orie Rout in Treatment: 2 Constitutional Well-nourished and well-hydrated in no acute distress. Respiratory normal breathing without difficulty. Psychiatric this patient is able to make decisions and demonstrates good insight into disease process. Alert and Oriented x 3. pleasant and  cooperative. Notes Upon inspection patient's wound bed again is very tiny I was barely able to get a skinny probe and there this means were not can to be able to get any Hydrofera Blue rope at all into the wound. I discussed with the patient that I really think this is more of a surgical issue than anything and she really needs to be evaluated by surgery she does have an appointment in September I believe is the 13th or so with [name]. Nonetheless I really do not know that she needs to wait that long but were having a difficult time getting her in anywhere else. Electronic Signature(s) Signed: 06/11/2023 1:45:30 PM By: Allen Derry PA-C Entered By: Allen Derry on 06/11/2023 13:45:29 -------------------------------------------------------------------------------- Physician Orders Details Patient Name: Date of Service: Sarah Code NN Stout. 06/11/2023 10:45 A M Medical Record Number: [id number] Patient Account Number: [id number] [name], [name] ([id number]) [id number] Page 3 of 6 Date of Birth/Sex: Treating RN: December 19, 1965 (57 y.o. Freddy Finner Primary Care Provider: Other Clinician: Rennie Plowman Referring Provider: Treating Provider/Extender: Orie Rout in Treatment: 2 Verbal / Phone Orders: No Diagnosis Coding ICD-10 Coding Code Description S31.609S Unspecified open wound of abdominal wall, unspecified quadrant with penetration into peritoneal cavity, sequela Follow-up Appointments Return Appointment in 1 week. Bathing/ Shower/ Hygiene May shower; gently cleanse wound with antibacterial soap, rinse and pat dry prior to dressing wounds Anesthetic (Use 'Patient Medications' Section for Anesthetic Order Entry) Lidocaine applied to wound bed Wound Treatment Wound #1 - Abdomen - midline Wound Laterality: Midline, Proximal Cleanser: Byram Ancillary Kit - 15 Day  Supply (Generic) 1 x Per Day/30 Days Discharge Instructions: Use  supplies as instructed; Kit contains: (15) Saline Bullets; (15) 3x3 Gauze; 15 pr Gloves Topical: Mupirocin Ointment 1 x Per Day/30 Days Discharge Instructions: Apply as directed by provider. Secondary Dressing: Coverlet Latex-Free Fabric Adhesive Dressings 1 x Per Day/30 Days Discharge Instructions: 1.5 x 2 Patient Medications llergies: Gadolinium-Containing Contrast Media, Iodinated Contrast Media, tolmetin, NSAIDS (Non-Steroidal Anti-Inflammatory Drug), gallium nitrate A Notifications Medication Indication Start End 06/11/2023 mupirocin DOSE topical 2 % ointment - ointment topical once daily with each dressing change x 30 days Electronic Signature(s) Signed: 06/11/2023 1:46:55 PM By: Allen Derry PA-C Entered By: Allen Derry on 06/11/2023 13:46:55 -------------------------------------------------------------------------------- Problem List Details Patient Name: Date of Service: Sarah Code NN Stout. 06/11/2023 10:45 A M Medical Record Number: [id number] Patient Account Number: [id number] Date of Birth/Sex: Treating RN: June 03, 1966 (57 y.o. Freddy Finner Primary Care Provider: Rennie Plowman Other Clinician: Referring Provider: Treating Provider/Extender: Orie Rout in Treatment: 2 Active Problems ICD-10 Encounter Code Description Active Date MDM [name], [name] ([id number]) [id number] Page 4 of 6 Code Description Active Date MDM Diagnosis T81.31XA Disruption of external operation (surgical) wound, not elsewhere classified, 06/04/2023 No Yes initial encounter L98.492 Non-pressure chronic ulcer of skin of other sites with fat layer exposed 06/11/2023 No Yes Z98.84 Bariatric surgery status 06/11/2023 No Yes K74.3 Primary biliary cirrhosis 06/11/2023 No Yes Inactive Problems Resolved Problems Electronic Signature(s) Signed: 06/11/2023 1:49:20 PM By: Allen Derry PA-C Previous Signature: 06/11/2023 10:44:12 AM Version By: Allen Derry  PA-C Entered By: Allen Derry on 06/11/2023 13:49:19 -------------------------------------------------------------------------------- Progress Note Details Patient Name: Date of Service: Sarah Code NN Stout. 06/11/2023 10:45 A M Medical Record Number: [id number] Patient Account Number: [id number] Date of Birth/Sex: Treating RN: [birth year] (57 y.o. Freddy Finner Primary Care Provider: Rennie Plowman Other Clinician: Referring Provider: Treating Provider/Extender: Orie Rout in Treatment: 2 Subjective Chief Complaint Information obtained from Patient Surgical wound abdominal region History of Present Illness (HPI) 05-28-2023 this is a note that is being put in late secondary to the fact that computers were down across the world due to a [employer] outage. Therefore there may be some lack of detail compared to what would normally be present in the notes. With that being said the patient tells me at this point that she has been having some issues today with a wound over the periumbilical region. Subsequently this is a previous site of surgery and she has unfortunately not been able to get this to completely heal. It stay closed for quite a bit of time in fact the initial surgery was a bariatric surgery 2 years ago. 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 It is unclear as to why this may have started giving her trouble now at this point. With that being said she has contacted the original surgeon who has refused to see her. She is also been referred to plastic surgery who refused to see her locally. Subsequently I  recommended the possibility of seeing if [name] or [name] could review her case apparently they already have and have  refused to take on her case. This is a very difficult situation that to be honest I think is going to probably require some type of surgical intervention I am just unsure of exactly what is going to be necessary. I discussed all this with the patient today. 7/26 this patient was admitted to the clinic last week. She has a small probing wound in the central abdomen and epigastric area. We used Hydrofera Blue packing to this area. The patient has been changed changing the dressing herself. I believe she is also on antibiotics although I do not see any culture results currently. She is followed carefully by her primary doctor [name]. She had a CT scan of her abdomen on 7/25 which showed an enlarging fluid collection in the right lower quadrant compared to a previous film currently 14.5 x 10.3. There is also a fluid collection measuring 3.8 x 2 in the area above the umbilicus which is the area where her small probing wound is. [name] referred the patient to [name] of plastic surgery in [place]. Apparently the patient states she has attempted to get in with general surgery in [place] but they will not see her. Her original panniculectomy surgery was done by [name] in [place] however the patient refuses to go back to see him or his practice. According to the patient she has been unable to find another Editor, commissioning either general or plastics. [name], [name] ([id number]) [id number] Page 5 of 6 The patient had a gastric bypass several years ago and then subsequently went on to have a panniculectomy. Somehow she has been left with a small open wound in the central part of her abdomen just caudal to the umbilicus. She has the fluid collections noted above. She says she continues to have drainage out of this small area. She has  abdominal pain which feels like a tearing sensation in her lower mid abdomen. She does not have any acute sounding GI issues however. 06-11-2023 upon evaluation today patient appears to be doing much better in regard to the overall external appearance of her wound that we know that she still has the abscesses internally. Unfortunately based on what we are seeing right now would not even need to be able to get a sense Hydrofera Blue rope dressing down into this wound which is quite unfortunate. Objective Constitutional Well-nourished and well-hydrated in no acute distress. Vitals Time Taken: 10:48 AM, Height: 68 in, Weight: 175 lbs, BMI: 26.6, Temperature: 98.2 F, Pulse: 61 bpm, Respiratory Rate: 18 breaths/min, Blood Pressure: 141/71 mmHg. Respiratory normal breathing without difficulty. Psychiatric this patient is able to make decisions and demonstrates good insight into disease process. Alert and Oriented x 3. pleasant and cooperative. General Notes: Upon inspection patient's wound bed again is very tiny I was barely able to get a skinny probe and there this means were not can to be able to get any Hydrofera Blue rope at all into the wound. I discussed with the patient that I really think this is more of a surgical issue than anything and she really needs to be evaluated by surgery she does have an appointment in September I believe is the 13th or so with [name]. Nonetheless I really do not know that she needs to wait that long but were having a difficult time getting her in anywhere else. Integumentary (Hair, Skin) Wound #1 status is Open. Original cause of wound was Surgical Injury. The date acquired was: 05/09/2022. The wound has been in  treatment 2 weeks. The wound is located on the Proximal,Midline Abdomen - midline. The wound measures 0.1cm length x 0.1cm width x 0.5cm depth; 0.008cm^2 area and 0.004cm^3 volume. There is Fat Layer (Subcutaneous Tissue) exposed. There is no tunneling  noted, however, there is undermining starting at 6:00 and ending at 8:00 with a maximum distance of 3.2cm. There is a medium amount of serosanguineous drainage noted. There is large (67-100%) red granulation within the wound bed. There is a small (1-33%) amount of necrotic tissue within the wound bed including Adherent Slough. 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 Bathing/ Shower/ Hygiene: May shower; gently cleanse wound with antibacterial soap, rinse and pat dry prior to dressing wounds Anesthetic (Use 'Patient Medications' Section for Anesthetic Order Entry): Lidocaine applied to wound bed The following medication(s) was prescribed: mupirocin topical 2 % ointment ointment topical once daily with each dressing change x 30 days starting 06/11/2023 WOUND #1: - Abdomen - midline Wound Laterality: Midline, Proximal Cleanser: Byram Ancillary Kit - 15 Day Supply (Generic) 1 x Per Day/30 Days Discharge Instructions: Use supplies as instructed; Kit contains: (15) Saline Bullets; (15) 3x3 Gauze; 15 pr Gloves Topical: Mupirocin Ointment 1 x Per Day/30 Days Discharge Instructions: Apply as directed by provider. Secondary Dressing: Coverlet Latex-Free Fabric Adhesive Dressings 1 x Per Day/30 Days Discharge Instructions: 1.5 x 2 1. From a wound care perspective there really is not anything else that we can do. This is a wound that is more of a surgical issue she has abscesses internally that is going to require this to be cleaned out and really this is a surgical issue. I explained this to the patient for some I saw her and really the situation has not changed at this point. We were attempting to try to see if we get this to feeling  from the bottom out but again I do not think that is going to happen. 2. I am good recommend right now that the only thing we can really plan to do is have her use a mupirocin ointment and then subsequently a cover dressing to [name], [name] ([id number]) [id number] Page 6 of 6 catch any drainage. There is absolutely no way that I can get anything inside in order to pack the area because it is so small. Nonetheless I think that she should change this once a day and again I am afraid this is still going to still up in his clinic like fluid this can open up again and continue this vicious cycle unless she gets in with a surgeon again from a wound care perspective there is really nothing more I can do. We will see patient back for reevaluation in 1 week here in the clinic. If anything worsens or changes patient will contact our office for additional recommendations. I am primarily continuing to see her until she gets in with a surgeon just to try to help with directing if anything becomes infected what she may need to do. Electronic Signature(s) Signed: 06/11/2023 1:49:46 PM By: Allen Derry PA-C Previous Signature: 06/11/2023 1:47:13 PM Version By: Allen Derry PA-C Entered By: Allen Derry on 06/11/2023 13:49:46 -------------------------------------------------------------------------------- SuperBill Details Patient Name: Date of Service: Sarah Code NN Stout. 06/11/2023 Medical Record Number: [id number] Patient Account Number: [id number] Date of Birth/Sex: Treating RN: 1965-12-06 (56  y.o. Freddy Finner Primary Care Provider: Rennie Plowman Other Clinician: Referring Provider: Treating Provider/Extender: Orie Rout in Treatment: 2 Diagnosis Coding ICD-10 Codes Code Description T81.31XA Disruption of external operation (surgical) wound, not elsewhere classified, initial encounter L98.492 Non-pressure chronic ulcer of skin of other sites with  fat layer exposed Z98.84 Bariatric surgery status K74.3 Primary biliary cirrhosis Facility Procedures : CPT4 Code: [id number] Description: 69629 - WOUND CARE VISIT-LEV 2 EST PT Modifier: Quantity: 1 Physician Procedures : CPT4 Code Description Modifier [id number] - WC PHYS LEVEL 4 - EST PT ICD-10 Diagnosis Description T81.31XA Disruption of external operation (surgical) wound, not elsewhere classified, initial encounter L98.492 Non-pressure chronic ulcer of skin of  other sites with fat layer exposed Z98.84 Bariatric surgery status K74.3 Primary biliary cirrhosis Quantity: 1 Electronic Signature(s) Signed: 06/11/2023 1:50:00 PM By: Allen Derry PA-C Entered By: Allen Derry on 06/11/2023 13:50:00

## 2023-06-11 NOTE — Progress Notes (Addendum)
CLOE, SOCKWELL D ([id number]) [id number] Page 1 of 9 Visit Report for 06/11/2023 Arrival Information Details Patient Name: Date of Service: [name], Sarah NN D. 06/11/2023 10:45 A M Medical Record Number: [id number] Patient Account Number: [id number] Date of Birth/Sex: Treating RN: 31-Dec-1965 (57 y.o. Freddy Finner Primary Care : Rennie Plowman Other Clinician: Referring : Treating /Extender: Orie Rout in Treatment: 2 Visit Information History Since Last Visit Added or deleted any medications: No Patient Arrived: Ambulatory Any new allergies or adverse reactions: No Arrival Time: 10:47 Had a fall or experienced change in No Accompanied By: self activities of daily living that may affect Transfer Assistance: None risk of falls: Patient Identification Verified: Yes Signs or symptoms of abuse/neglect since last visito No Secondary Verification Process Completed: Yes Hospitalized since last visit: No Patient Requires Transmission-Based Precautions: No Implantable device outside of the clinic excluding No Patient Has Alerts: No cellular tissue based products placed in the center since last visit: Has Dressing in Place as Prescribed: Yes Pain Present Now: No Electronic Signature(s) Signed: 06/15/2023 3:47:31 PM By: Yevonne Pax RN Entered By: Yevonne Pax on 06/11/2023 10:48:23 -------------------------------------------------------------------------------- Clinic Level of Care Assessment Details Patient Name: Date of Service: [name], Sarah NN D. 06/11/2023 10:45 A M Medical Record Number: [id number] Patient Account Number: [id number] Date of Birth/Sex: Treating RN: 1966-06-25 (57 y.o. Freddy Finner Primary Care : Rennie Plowman Other Clinician: Referring : Treating /Extender: Orie Rout in Treatment: 2 Clinic Level of Care Assessment Items TOOL 4  Quantity Score X- 1 0 Use when only an EandM is performed on FOLLOW-UP visit ASSESSMENTS - Nursing Assessment / Reassessment X- 1 10 Reassessment of Co-morbidities (includes updates in patient status) X- 1 5 Reassessment of Adherence to Treatment Plan [name], [name] ([id number]) [phone].pdf Page 2 of 9 ASSESSMENTS - Wound and Skin A ssessment / Reassessment X - Simple Wound Assessment / Reassessment - one wound 1 5 []  - 0 Complex Wound Assessment / Reassessment - multiple wounds []  - 0 Dermatologic / Skin Assessment (not related to wound area) ASSESSMENTS - Focused Assessment []  - 0 Circumferential Edema Measurements - multi extremities []  - 0 Nutritional Assessment / Counseling / Intervention []  - 0 Lower Extremity Assessment (monofilament, tuning fork, pulses) []  - 0 Peripheral Arterial Disease Assessment (using hand held doppler) ASSESSMENTS - Ostomy and/or Continence Assessment and Care []  - 0 Incontinence Assessment and Management []  - 0 Ostomy Care Assessment and Management (repouching, etc.) PROCESS - Coordination of Care X - Simple Patient / Family Education for ongoing care 1 15 []  - 0 Complex (extensive) Patient / Family Education for ongoing care []  - 0 Staff obtains Chiropractor, Records, T Results / Process Orders est []  - 0 Staff telephones HHA, Nursing Homes / Clarify orders / etc []  - 0 Routine Transfer to another Facility (non-emergent condition) []  - 0 Routine Hospital Admission (non-emergent condition) []  - 0 New Admissions / Manufacturing engineer / Ordering NPWT Apligraf, etc. , []  - 0 Emergency Hospital Admission (emergent condition) X- 1 10 Simple Discharge Coordination []  - 0 Complex (extensive) Discharge Coordination PROCESS - Special Needs []  - 0 Pediatric / Minor Patient Management []  - 0 Isolation Patient Management []  - 0 Hearing / Language / Visual special needs []  - 0 Assessment of Community assistance  (transportation, D/C planning, etc.) []  - 0 Additional assistance / Altered mentation []  - 0 Support Surface(s) Assessment (bed, cushion, seat, etc.) INTERVENTIONS - Wound Cleansing / Measurement X -  Simple Wound Cleansing - one wound 1 5 []  - 0 Complex Wound Cleansing - multiple wounds X- 1 5 Wound Imaging (photographs - any number of wounds) []  - 0 Wound Tracing (instead of photographs) X- 1 5 Simple Wound Measurement - one wound []  - 0 Complex Wound Measurement - multiple wounds INTERVENTIONS - Wound Dressings X - Small Wound Dressing one or multiple wounds 1 10 []  - 0 Medium Wound Dressing one or multiple wounds []  - 0 Large Wound Dressing one or multiple wounds []  - 0 Application of Medications - topical []  - 0 Application of Medications - injection INTERVENTIONS - Miscellaneous []  - 0 External ear exam [name], [name] ([id number]) [phone].pdf Page 3 of 9 []  - 0 Specimen Collection (cultures, biopsies, blood, body fluids, etc.) []  - 0 Specimen(s) / Culture(s) sent or taken to Lab for analysis []  - 0 Patient Transfer (multiple staff / Michiel Sites Lift / Similar devices) []  - 0 Simple Staple / Suture removal (25 or less) []  - 0 Complex Staple / Suture removal (26 or more) []  - 0 Hypo / Hyperglycemic Management (close monitor of Blood Glucose) []  - 0 Ankle / Brachial Index (ABI) - do not check if billed separately X- 1 5 Vital Signs Has the patient been seen at the hospital within the last three years: Yes Total Score: 75 Level Of Care: New/Established - Level 2 Electronic Signature(s) Signed: 06/15/2023 3:47:31 PM By: Yevonne Pax RN Entered By: Yevonne Pax on 06/11/2023 11:28:05 -------------------------------------------------------------------------------- Encounter Discharge Information Details Patient Name: Date of Service: Sarah Code NN D. 06/11/2023 10:45 A M Medical Record Number: [id number] Patient Account Number:  [id number] Date of Birth/Sex: Treating RN: 1966-04-20 (57 y.o. Freddy Finner Primary Care : Rennie Plowman Other Clinician: Referring : Treating /Extender: Orie Rout in Treatment: 2 Encounter Discharge Information Items Discharge Condition: Stable Ambulatory Status: Ambulatory Discharge Destination: Home Transportation: Private Auto Accompanied By: self Schedule Follow-up Appointment: Yes Clinical Summary of Care: Electronic Signature(s) Signed: 06/15/2023 3:47:31 PM By: Yevonne Pax RN Entered By: Yevonne Pax on 06/11/2023 11:28:41 -------------------------------------------------------------------------------- Lower Extremity Assessment Details Patient Name: Date of Service: KALANA, YUST NN D. 06/11/2023 10:45 A Len Blalock D ([id number]) [phone].pdf Page 4 of 9 Medical Record Number: [id number] Patient Account Number: [id number] Date of Birth/Sex: Treating RN: [birth year] (57 y.o. Freddy Finner Primary Care : Rennie Plowman Other Clinician: Referring : Treating /Extender: Orie Rout in Treatment: 2 Electronic Signature(s) Signed: 06/15/2023 3:47:31 PM By: Yevonne Pax RN Entered By: Yevonne Pax on 06/11/2023 10:53:47 -------------------------------------------------------------------------------- Multi Wound Chart Details Patient Name: Date of Service: Sarah Code NN D. 06/11/2023 10:45 A M Medical Record Number: [id number] Patient Account Number: [id number] Date of Birth/Sex: Treating RN: 08-03-1966 (57 y.o. Freddy Finner Primary Care : Rennie Plowman Other Clinician: Referring : Treating /Extender: Orie Rout in Treatment: 2 Vital Signs Height(in): 68 Pulse(bpm): 61 Weight(lbs): 175 Blood Pressure(mmHg): 141/71 Body Mass Index(BMI): 26.6 Temperature(F): 98.2 Respiratory  Rate(breaths/min): 18 [1:Photos:] [N/A:N/A] Proximal, Midline Abdomen - midline N/A N/A Wound Location: Surgical Injury N/A N/A Wounding Event: Dehisced Wound N/A N/A Primary Etiology: Hypertension N/A N/A Comorbid History: 05/09/2022 N/A N/A Date Acquired: 2 N/A N/A Weeks of Treatment: Open N/A N/A Wound Status: No N/A N/A Wound Recurrence: 0.1x0.1x0.5 N/A N/A Measurements L x W x D (cm) 0.008 N/A N/A A (cm) : rea 0.004 N/A N/A Volume (cm) : 88.70% N/A N/A % Reduction in A rea: 90.50% N/A  N/A % Reduction in Volume: Full Thickness Without Exposed N/A N/A Classification: Support Structures Medium N/A N/A Exudate Amount: Serosanguineous N/A N/A Exudate Type: red, brown N/A N/A Exudate Color: Large (67-100%) N/A N/A Granulation Amount: Red N/A N/A Granulation Quality: Small (1-33%) N/A N/A Necrotic Amount: Fat Layer (Subcutaneous Tissue): Yes N/A N/A Exposed Structures: Fascia: No Tendon: No Muscle: No Joint: No [name], [name] ([id number]) [id number] Page 5 of 9 Bone: No None N/A N/A Epithelialization: Treatment Notes Electronic Signature(s) Signed: 06/15/2023 3:47:31 PM By: Yevonne Pax RN Entered By: Yevonne Pax on 06/11/2023 10:53:53 -------------------------------------------------------------------------------- Multi-Disciplinary Care Plan Details Patient Name: Date of Service: Sarah Code NN D. 06/11/2023 10:45 A M Medical Record Number: [id number] Patient Account Number: [id number] Date of Birth/Sex: Treating RN: November 30, 1965 (57 y.o. Freddy Finner Primary Care : Rennie Plowman Other Clinician: Referring : Treating /Extender: Orie Rout in Treatment: 2 Active Inactive Wound/Skin Impairment Nursing Diagnoses: Knowledge deficit related to ulceration/compromised skin integrity Goals: Patient/caregiver will verbalize understanding of skin care regimen Date Initiated:  05/31/2023 Target Resolution Date: 06/28/2023 Goal Status: Active Ulcer/skin breakdown will have a volume reduction of 30% by week 4 Date Initiated: 05/31/2023 Target Resolution Date: 06/28/2023 Goal Status: Active Ulcer/skin breakdown will have a volume reduction of 50% by week 8 Date Initiated: 05/31/2023 Target Resolution Date: 07/29/2023 Goal Status: Active Ulcer/skin breakdown will have a volume reduction of 80% by week 12 Date Initiated: 05/31/2023 Target Resolution Date: 08/28/2023 Goal Status: Active Ulcer/skin breakdown will heal within 14 weeks Date Initiated: 05/31/2023 Target Resolution Date: 09/28/2023 Goal Status: Active Interventions: Assess patient/caregiver ability to obtain necessary supplies Assess patient/caregiver ability to perform ulcer/skin care regimen upon admission and as needed Assess ulceration(s) every visit Notes: Electronic Signature(s) Signed: 06/15/2023 3:47:31 PM By: Yevonne Pax RN Entered By: Yevonne Pax on 06/11/2023 10:54:02 [name] ([id number]) [id number] Page 6 of 9 -------------------------------------------------------------------------------- Pain Assessment Details Patient Name: Date of Service: VYLETTE, STRUBEL NN D. 06/11/2023 10:45 A M Medical Record Number: [id number] Patient Account Number: [id number] Date of Birth/Sex: Treating RN: July 24, 1966 (57 y.o. Freddy Finner Primary Care : Rennie Plowman Other Clinician: Referring : Treating /Extender: Orie Rout in Treatment: 2 Active Problems Location of Pain Severity and Description of Pain Patient Has Paino No Site Locations Pain Management and Medication Current Pain Management: Electronic Signature(s) Signed: 06/15/2023 3:47:31 PM By: Yevonne Pax RN Entered By: Yevonne Pax on 06/11/2023 10:52:21 -------------------------------------------------------------------------------- Patient/Caregiver  Education Details Patient Name: Date of Service: Sarah Code NN D. 8/2/2024andnbsp10:45 A M Medical Record Number: [id number] Patient Account Number: [id number] Date of Birth/Gender: Treating RN: [birth year] (56 y.o. Freddy Finner Primary Care Physician: Rennie Plowman Other Clinician: Referring Physician: Treating Physician/Extender: Orie Rout in Treatment: 2 [name], [name] ([id number]) [id number] Page 7 of 9 Education Assessment Education Provided To: Patient Education Topics Provided Wound/Skin Impairment: Handouts: Caring for Your Ulcer Methods: Explain/Verbal Responses: State content correctly Electronic Signature(s) Signed: 06/15/2023 3:47:31 PM By: Yevonne Pax RN Entered By: Yevonne Pax on 06/11/2023 10:54:12 -------------------------------------------------------------------------------- Wound Assessment Details Patient Name: Date of Service: Sarah Code NN D. 06/11/2023 10:45 A M Medical Record Number: [id number] Patient Account Number: [id number] Date of Birth/Sex: Treating RN: 11-02-1966 (57 y.o. Freddy Finner Primary Care : Rennie Plowman Other Clinician: Referring : Treating /Extender: Orie Rout in Treatment: 2 Wound Status Wound Number: 1 Primary Etiology: Dehisced Wound Wound Location: Proximal, Midline Abdomen - midline Wound Status: Open Wounding Event:  Surgical Injury Comorbid History: Hypertension Date Acquired: 05/09/2022 Weeks Of Treatment: 2 Clustered Wound: No Photos Wound Measurements Length: (cm) 0.1 Width: (cm) 0.1 Depth: (cm) 0.5 Area: (cm) 0. Volume: (cm) 0. [name], [name] ([id number]) Wound Description Classification: Full Thickness Without Exposed Suppor Exudate Amount: Medium Exudate Type: Serosanguineous Exudate Color: red, brown t Structures Foul Odor After Cleansing: Slough/Fibrino % Reduction in Area: 88.7% %  Reduction in Volume: 90.5% Epithelialization: None 008 Tunneling: No 004 Undermining: Yes Starting Position (o'clock): 6 Ending Position (o'clock): 8 Maximum Distance: (cm) 3.2 [id number] Page 8 of 9 No Yes Wound Bed Granulation Amount: Large (67-100%) Exposed Structure Granulation Quality: Red Fascia Exposed: No Necrotic Amount: Small (1-33%) Fat Layer (Subcutaneous Tissue) Exposed: Yes Necrotic Quality: Adherent Slough Tendon Exposed: No Muscle Exposed: No Joint Exposed: No Bone Exposed: No Treatment Notes Wound #1 (Abdomen - midline) Wound Laterality: Midline, Proximal Cleanser [place] Ancillary Kit - 15 Day Supply Discharge Instruction: Use supplies as instructed; Kit contains: (15) Saline Bullets; (15) 3x3 Gauze; 15 pr Gloves Peri-Wound Care Topical Mupirocin Ointment Discharge Instruction: Apply as directed by . Primary Dressing Secondary Dressing Coverlet Latex-Free Fabric Adhesive Dressings Discharge Instruction: 1.5 x 2 Secured With Compression Wrap Compression Stockings Add-Ons Electronic Signature(s) Signed: 06/15/2023 3:47:31 PM By: Yevonne Pax RN Entered By: Yevonne Pax on 06/11/2023 10:54:48 -------------------------------------------------------------------------------- Vitals Details Patient Name: Date of Service: Sarah Code NN D. 06/11/2023 10:45 A M Medical Record Number: [id number] Patient Account Number: [id number] Date of Birth/Sex: Treating RN: 08-Apr-1966 (57 y.o. Freddy Finner Primary Care : Rennie Plowman Other Clinician: Referring : Treating /Extender: Orie Rout in Treatment: 2 Vital Signs Time Taken: 10:48 Temperature (F): 98.2 Height (in): 68 Pulse (bpm): 61 Weight (lbs): 175 Respiratory Rate (breaths/min): 18 Body Mass Index (BMI): 26.6 Blood Pressure (mmHg): 141/71 [name], [name] ([id number]) [phone].pdf Page 9 of  9 Reference Range: 80 - 120 mg / dl Electronic Signature(s) Signed: 06/15/2023 3:47:31 PM By: Yevonne Pax RN Entered By: Yevonne Pax on 06/11/2023 10:48:43

## 2023-06-18 ENCOUNTER — Encounter: Payer: Managed Care, Other (non HMO) | Admitting: Physician Assistant

## 2023-06-18 DIAGNOSIS — T8131XA Disruption of external operation (surgical) wound, not elsewhere classified, initial encounter: Secondary | ICD-10-CM | POA: Diagnosis not present

## 2023-06-18 NOTE — Progress Notes (Signed)
BRYCELYNN, EMPEY D ([id number]) [id number] Page 1 of 8 Visit Report for 06/18/2023 Arrival Information Details Patient Name: Date of Service: [name], Sarah NN D. 06/18/2023 8:30 A M Medical Record Number: [id number] Patient Account Number: [id number] Date of Birth/Sex: Treating RN: 1966-02-07 (57 y.o. Esmeralda Links Primary Care : Rennie Plowman Other Clinician: Betha Loa Referring : Treating /Extender: Orie Rout in Treatment: 3 Visit Information History Since Last Visit All ordered tests and consults were completed: No Patient Arrived: Ambulatory Added or deleted any medications: No Arrival Time: 08:46 Any new allergies or adverse reactions: No Transfer Assistance: None Had a fall or experienced change in No Patient Identification Verified: Yes activities of daily living that may affect Secondary Verification Process Completed: Yes risk of falls: Patient Requires Transmission-Based Precautions: No Signs or symptoms of abuse/neglect since last visito No Patient Has Alerts: No Hospitalized since last visit: No Implantable device outside of the clinic excluding No cellular tissue based products placed in the center since last visit: Has Dressing in Place as Prescribed: Yes Pain Present Now: No Electronic Signature(s) Signed: 06/24/2023 8:42:03 AM By: Betha Loa Entered By: Betha Loa on 06/18/2023 08:52:25 -------------------------------------------------------------------------------- Clinic Level of Care Assessment Details Patient Name: Date of Service: KEYSI, SOLIDAY NN D. 06/18/2023 8:30 A M Medical Record Number: [id number] Patient Account Number: [id number] Date of Birth/Sex: Treating RN: 11/02/1966 (57 y.o. Esmeralda Links Primary Care : Rennie Plowman Other Clinician: Betha Loa Referring : Treating /Extender: Orie Rout in  Treatment: 3 Clinic Level of Care Assessment Items TOOL 4 Quantity Score []  - 0 Use when only an EandM is performed on FOLLOW-UP visit ASSESSMENTS - Nursing Assessment / Reassessment X- 1 10 Reassessment of Co-morbidities (includes updates in patient status) X- 1 5 Reassessment of Adherence to Treatment Plan SOPHIE, CHEVRIER ([id number]) [id number] Page 2 of 8 ASSESSMENTS - Wound and Skin A ssessment / Reassessment X - Simple Wound Assessment / Reassessment - one wound 1 5 []  - 0 Complex Wound Assessment / Reassessment - multiple wounds []  - 0 Dermatologic / Skin Assessment (not related to wound area) ASSESSMENTS - Focused Assessment []  - 0 Circumferential Edema Measurements - multi extremities []  - 0 Nutritional Assessment / Counseling / Intervention []  - 0 Lower Extremity Assessment (monofilament, tuning fork, pulses) []  - 0 Peripheral Arterial Disease Assessment (using hand held doppler) ASSESSMENTS - Ostomy and/or Continence Assessment and Care []  - 0 Incontinence Assessment and Management []  - 0 Ostomy Care Assessment and Management (repouching, etc.) PROCESS - Coordination of Care X - Simple Patient / Family Education for ongoing care 1 15 []  - 0 Complex (extensive) Patient / Family Education for ongoing care []  - 0 Staff obtains Chiropractor, Records, T Results / Process Orders est []  - 0 Staff telephones HHA, Nursing Homes / Clarify orders / etc []  - 0 Routine Transfer to another Facility (non-emergent condition) []  - 0 Routine Hospital Admission (non-emergent condition) []  - 0 New Admissions / Manufacturing engineer / Ordering NPWT Apligraf, etc. , []  - 0 Emergency Hospital Admission (emergent condition) X- 1 10 Simple Discharge Coordination []  - 0 Complex (extensive) Discharge Coordination PROCESS - Special Needs []  - 0 Pediatric / Minor Patient Management []  - 0 Isolation Patient Management []  - 0 Hearing / Language /  Visual special needs []  - 0 Assessment of Community assistance (transportation, D/C planning, etc.) []  - 0 Additional assistance / Altered mentation []  - 0 Support Surface(s) Assessment (bed, cushion, seat, etc.)  INTERVENTIONS - Wound Cleansing / Measurement X - Simple Wound Cleansing - one wound 1 5 []  - 0 Complex Wound Cleansing - multiple wounds X- 1 5 Wound Imaging (photographs - any number of wounds) []  - 0 Wound Tracing (instead of photographs) []  - 0 Simple Wound Measurement - one wound []  - 0 Complex Wound Measurement - multiple wounds INTERVENTIONS - Wound Dressings X - Small Wound Dressing one or multiple wounds 1 10 []  - 0 Medium Wound Dressing one or multiple wounds []  - 0 Large Wound Dressing one or multiple wounds X- 1 5 Application of Medications - topical []  - 0 Application of Medications - injection INTERVENTIONS - Miscellaneous []  - 0 External ear exam SELVA, NOONER ([id number]) [id number] Page 3 of 8 []  - 0 Specimen Collection (cultures, biopsies, blood, body fluids, etc.) []  - 0 Specimen(s) / Culture(s) sent or taken to Lab for analysis []  - 0 Patient Transfer (multiple staff / Michiel Sites Lift / Similar devices) []  - 0 Simple Staple / Suture removal (25 or less) []  - 0 Complex Staple / Suture removal (26 or more) []  - 0 Hypo / Hyperglycemic Management (close monitor of Blood Glucose) []  - 0 Ankle / Brachial Index (ABI) - do not check if billed separately X- 1 5 Vital Signs Has the patient been seen at the hospital within the last three years: Yes Total Score: 75 Level Of Care: New/Established - Level 2 Electronic Signature(s) Signed: 06/24/2023 8:42:03 AM By: Betha Loa Entered By: Betha Loa on 06/18/2023 09:42:50 -------------------------------------------------------------------------------- Encounter Discharge Information Details Patient Name: Date of Service: Lyndon Code NN D. 06/18/2023 8:30 A  M Medical Record Number: [id number] Patient Account Number: [id number] Date of Birth/Sex: Treating RN: [birth year] (57 y.o. Esmeralda Links Primary Care : Rennie Plowman Other Clinician: Betha Loa Referring : Treating /Extender: Orie Rout in Treatment: 3 Encounter Discharge Information Items Discharge Condition: Stable Ambulatory Status: Ambulatory Discharge Destination: Home Transportation: Private Auto Accompanied By: self Schedule Follow-up Appointment: Yes Clinical Summary of Care: Electronic Signature(s) Signed: 06/24/2023 8:42:03 AM By: Betha Loa Entered By: Betha Loa on 06/18/2023 10:06:34 Lower Extremity Assessment Details -------------------------------------------------------------------------------- [name] ([id number]) [id number] Page 4 of 8 Patient Name: Date of Service: ELIANY, HEMPFLING NN D. 06/18/2023 8:30 A M Medical Record Number: [id number] Patient Account Number: [id number] Date of Birth/Sex: Treating RN: 07/18/1966 (57 y.o. Esmeralda Links Primary Care : Rennie Plowman Other Clinician: Betha Loa Referring : Treating /Extender: Orie Rout in Treatment: 3 Electronic Signature(s) Signed: 06/18/2023 12:12:04 PM By: Angelina Pih Signed: 06/24/2023 8:42:03 AM By: Betha Loa Entered By: Betha Loa on 06/18/2023 08:57:44 -------------------------------------------------------------------------------- Multi Wound Chart Details Patient Name: Date of Service: Lyndon Code NN D. 06/18/2023 8:30 A M Medical Record Number: [id number] Patient Account Number: [id number] Date of Birth/Sex: Treating RN: November 04, 1966 (57 y.o. Esmeralda Links Primary Care : Rennie Plowman Other Clinician: Betha Loa Referring : Treating /Extender: Orie Rout in Treatment:  3 Vital Signs Height(in): 68 Pulse(bpm): 62 Weight(lbs): 175 Blood Pressure(mmHg): 145/86 Body Mass Index(BMI): 26.6 Temperature(F): 98.1 Respiratory Rate(breaths/min): 16 [1:Photos:] [N/A:N/A] Proximal, Midline Abdomen - midline N/A N/A Wound Location: Surgical Injury N/A N/A Wounding Event: Dehisced Wound N/A N/A Primary Etiology: Hypertension N/A N/A Comorbid History: 05/09/2022 N/A N/A Date Acquired: 3 N/A N/A Weeks of Treatment: Open N/A N/A Wound Status: No N/A N/A Wound Recurrence: 0.1x0.1x0.1 N/A N/A Measurements L x W x D (cm) 0.008 N/A N/A A (cm) :  rea 0.001 N/A N/A Volume (cm) : 88.70% N/A N/A % Reduction in A rea: 97.60% N/A N/A % Reduction in Volume: Full Thickness Without Exposed N/A N/A Classification: Support Structures Medium N/A N/A Exudate Amount: Serosanguineous N/A N/A Exudate Type: red, brown N/A N/A Exudate Color: Large (67-100%) N/A N/A Granulation Amount: Red N/A N/A Granulation Quality: Small (1-33%) N/A N/A Necrotic Amount: Fat Layer (Subcutaneous Tissue): Yes N/A N/A Exposed Structures: Fascia: No Tendon: No KELA, BURKI ([id number]) [id number] Page 5 of 8 Muscle: No Joint: No Bone: No None N/A N/A Epithelialization: Treatment Notes Electronic Signature(s) Signed: 06/24/2023 8:42:03 AM By: Betha Loa Entered By: Betha Loa on 06/18/2023 08:58:14 -------------------------------------------------------------------------------- Multi-Disciplinary Care Plan Details Patient Name: Date of Service: Lyndon Code NN D. 06/18/2023 8:30 A M Medical Record Number: [id number] Patient Account Number: [id number] Date of Birth/Sex: Treating RN: [birth year] (57 y.o. Esmeralda Links Primary Care : Rennie Plowman Other Clinician: Betha Loa Referring : Treating /Extender: Orie Rout in Treatment: 3 Active Inactive Wound/Skin  Impairment Nursing Diagnoses: Knowledge deficit related to ulceration/compromised skin integrity Goals: Patient/caregiver will verbalize understanding of skin care regimen Date Initiated: 05/31/2023 Target Resolution Date: 06/28/2023 Goal Status: Active Ulcer/skin breakdown will have a volume reduction of 30% by week 4 Date Initiated: 05/31/2023 Target Resolution Date: 06/28/2023 Goal Status: Active Ulcer/skin breakdown will have a volume reduction of 50% by week 8 Date Initiated: 05/31/2023 Target Resolution Date: 07/29/2023 Goal Status: Active Ulcer/skin breakdown will have a volume reduction of 80% by week 12 Date Initiated: 05/31/2023 Target Resolution Date: 08/28/2023 Goal Status: Active Ulcer/skin breakdown will heal within 14 weeks Date Initiated: 05/31/2023 Target Resolution Date: 09/28/2023 Goal Status: Active Interventions: Assess patient/caregiver ability to obtain necessary supplies Assess patient/caregiver ability to perform ulcer/skin care regimen upon admission and as needed Assess ulceration(s) every visit Notes: Electronic Signature(s) Signed: 06/18/2023 12:12:04 PM By: Angelina Pih Signed: 06/24/2023 8:42:03 AM By: Betha Loa Entered By: Betha Loa on 06/18/2023 09:43:05 [name] ([id number]) [id number] Page 6 of 8 -------------------------------------------------------------------------------- Pain Assessment Details Patient Name: Date of Service: SHELBYE, IGO NN D. 06/18/2023 8:30 A M Medical Record Number: [id number] Patient Account Number: [id number] Date of Birth/Sex: Treating RN: 1966/01/17 (57 y.o. Esmeralda Links Primary Care : Rennie Plowman Other Clinician: Betha Loa Referring : Treating /Extender: Orie Rout in Treatment: 3 Active Problems Location of Pain Severity and Description of Pain Patient Has Paino No Site Locations Pain Management and  Medication Current Pain Management: Electronic Signature(s) Signed: 06/18/2023 12:12:04 PM By: Angelina Pih Signed: 06/24/2023 8:42:03 AM By: Betha Loa Entered By: Betha Loa on 06/18/2023 08:54:18 -------------------------------------------------------------------------------- Patient/Caregiver Education Details Patient Name: Date of Service: Lyndon Code NN D. 8/9/2024andnbsp8:30 A M Medical Record Number: [id number] Patient Account Number: [id number] Date of Birth/Gender: Treating RN: 1966/05/11 (57 y.o. Esmeralda Links Primary Care Physician: Rennie Plowman Other Clinician: Betha Loa Referring Physician: Treating Physician/Extender: Orie Rout in Treatment: 3 KENEISHA, MILOSEVIC ([id number]) [phone].pdf Page 7 of 8 Education Assessment Education Provided To: Patient Education Topics Provided Wound/Skin Impairment: Handouts: Other: continue wound care as directed Methods: Explain/Verbal Responses: State content correctly Electronic Signature(s) Signed: 06/24/2023 8:42:03 AM By: Betha Loa Entered By: Betha Loa on 06/18/2023 10:02:20 -------------------------------------------------------------------------------- Wound Assessment Details Patient Name: Date of Service: Lyndon Code NN D. 06/18/2023 8:30 A M Medical Record Number: [id number] Patient Account Number: [id number] Date of Birth/Sex: Treating RN: 12/16/1965 (57 y.o. Esmeralda Links Primary Care : Rennie Plowman  Other Clinician: Betha Loa Referring : Treating /Extender: Orie Rout in Treatment: 3 Wound Status Wound Number: 1 Primary Etiology: Dehisced Wound Wound Location: Proximal, Midline Abdomen - midline Wound Status: Healed - Epithelialized Wounding Event: Surgical Injury Comorbid History: Hypertension Date Acquired: 05/09/2022 Weeks Of Treatment: 3 Clustered Wound:  No Photos Wound Measurements Length: (cm) Width: (cm) Depth: (cm) Area: (cm) Volume: (cm) 0 % Reduction in Area: 100% 0 % Reduction in Volume: 100% 0 Epithelialization: Large (67-100%) 0 0 Wound Description Classification: Full Thickness Without Exposed Support Structures Exudate Amount: None Present SAPHIA, LAFORTE ([id number]) Foul Odor After Cleansing: No Slough/Fibrino No [id number] Page 8 of 8 Wound Bed Granulation Amount: None Present (0%) Exposed Structure Necrotic Amount: None Present (0%) Fascia Exposed: No Fat Layer (Subcutaneous Tissue) Exposed: Yes Tendon Exposed: No Muscle Exposed: No Joint Exposed: No Bone Exposed: No Treatment Notes Wound #1 (Abdomen - midline) Wound Laterality: Midline, Proximal Cleanser Peri-Wound Care Topical Primary Dressing Secondary Dressing Secured With Compression Wrap Compression Stockings Add-Ons Electronic Signature(s) Signed: 06/18/2023 12:12:04 PM By: Angelina Pih Signed: 06/24/2023 8:42:03 AM By: Betha Loa Entered By: Betha Loa on 06/18/2023 09:32:01 -------------------------------------------------------------------------------- Vitals Details Patient Name: Date of Service: Lyndon Code NN D. 06/18/2023 8:30 A M Medical Record Number: [id number] Patient Account Number: [id number] Date of Birth/Sex: Treating RN: 1966/05/07 (57 y.o. Esmeralda Links Primary Care : Rennie Plowman Other Clinician: Betha Loa Referring : Treating /Extender: Orie Rout in Treatment: 3 Vital Signs Time Taken: 08:52 Temperature (F): 98.1 Height (in): 68 Pulse (bpm): 62 Weight (lbs): 175 Respiratory Rate (breaths/min): 16 Body Mass Index (BMI): 26.6 Blood Pressure (mmHg): 145/86 Reference Range: 80 - 120 mg / dl Electronic Signature(s) Signed: 06/24/2023 8:42:03 AM By: Betha Loa Entered By: Betha Loa on 06/18/2023 08:54:13

## 2023-06-18 NOTE — Progress Notes (Signed)
[name] Stout ([id number]) [phone].pdf Page 1 of 6 Visit Report for 06/18/2023 Chief Complaint Document Details Patient Name: Date of Service: [name] NN Stout. 06/18/2023 8:30 A M Medical Record Number: [id number] Patient Account Number: [id number] Date of Birth/Sex: Treating RN: Stout[birth year] (57 y.o. [name] Primary Care Provider: Rennie Stout Other Clinician: Betha Stout Referring Provider: Treating Provider/Extender: [name] in Treatment: 3 Information Obtained from: Patient Chief Complaint Surgical wound abdominal region Electronic Signature(s) Signed: 06/18/2023 8:32:11 AM By: Sarah Derry PA-C Entered By: [name] on 06/18/2023 08:32:11 -------------------------------------------------------------------------------- HPI Details Patient Name: Date of Service: [name] NN Stout. 06/18/2023 8:30 A M Medical Record Number: [id number] Patient Account Number: [id number] Date of Birth/Sex: Treating RN: [name] (57 y.o. [name] Primary Care Provider: Rennie Stout Other Clinician: Betha Stout Referring Provider: Treating Provider/Extender: [name] in Treatment: 3 History of Present Illness HPI Description: 05-28-2023 this is a note that is being put in late secondary to the fact that computers were down across the world due to a [employer] outage. Therefore there may be some lack of detail compared to what would normally be present in the notes. With that being said the patient tells me at this point that she has been having some issues today with a wound over the periumbilical region. Subsequently this is a previous site of surgery and she has unfortunately not been able to get this to completely heal. It stay closed for quite a bit of time in fact the initial surgery was a bariatric surgery 2 years ago. 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 It is unclear as to why this may have started giving her trouble now at this point. With that being said she has contacted the original surgeon who has refused to see her. She is also been referred to plastic surgery who refused to see her locally. Subsequently I recommended the possibility of seeing if [name] or [name] could review her case apparently they already have and have refused to take on her case. This is a very difficult situation that to be honest I think is going to probably require some type of surgical intervention I am just unsure of exactly what is going to be necessary. I discussed all this with the patient today. 7/26 this patient was admitted to the clinic last week. She has a small probing wound in the central abdomen and epigastric area. We used Hydrofera Blue packing to this area. The patient has been changed changing the dressing herself. I believe she is also on antibiotics although I do not see any culture results currently. She is followed carefully by her primary doctor [name]. She had a CT scan of her abdomen on 7/25 which showed an enlarging fluid collection in the right lower quadrant compared to a previous film currently 14.5 x 10.3. There is also a fluid collection measuring 3.8 x 2 in the area above the umbilicus which is the area where her small probing wound is. [name] referred the patient to Dr. Ulice Stout of plastic surgery in [place].  Apparently the patient [name], [name] ([id number]) [id number] Page 2 of 6 states she has attempted to get in with general surgery in [place]  but they will not see her. Her original panniculectomy surgery was done by Dr. Charmian Stout in [place] however the patient refuses to go back to see him or his practice. According to the patient she has been unable to find another Editor, commissioning either general or plastics. The patient had a gastric bypass several years ago and then subsequently went on to have a panniculectomy. Somehow she has been left with a small open wound in the central part of her abdomen just caudal to the umbilicus. She has the fluid collections noted above. She says she continues to have drainage out of this small area. She has abdominal pain which feels like a tearing sensation in her lower mid abdomen. She does not have any acute sounding GI issues however. 06-11-2023 upon evaluation today patient appears to be doing much better in regard to the overall external appearance of her wound that we know that she still has the abscesses internally. Unfortunately based on what we are seeing right now would not even need to be able to get a sense Hydrofera Blue rope dressing down into this wound which is quite unfortunate. 06-18-2023 upon evaluation today patient actually does not have anything open at this point. With that being said unfortunately she is continue to have issues with welling and even redness below the umbilical area as well as in the original site that we were helping her with. Nonetheless I do not think that this issue is resolved I think it is just building underneath and this is unfortunate. I do think that she probably needs to be seen in the ER for further evaluation of this to try to speed along some kind of improvement here. She voiced understanding. So far we have been unsuccessful getting her scheduled with anybody. Electronic Signature(s) Signed: 06/18/2023 9:41:58 AM By: Sarah Derry PA-C Entered By: [name] on 06/18/2023  09:41:58 -------------------------------------------------------------------------------- Physical Exam Details Patient Name: Date of Service: [name] NN Stout. 06/18/2023 8:30 A M Medical Record Number: [id number] Patient Account Number: [id number] Date of Birth/Sex: Treating RN: [birth year] (57 y.o. [name] Primary Care Provider: Rennie Stout Other Clinician: Betha Stout Referring Provider: Treating Provider/Extender: [name] in Treatment: 3 Constitutional Well-nourished and well-hydrated in no acute distress. Respiratory normal breathing without difficulty. Psychiatric this patient is able to make decisions and demonstrates good insight into disease process. Alert and Oriented x 3. pleasant and cooperative. Notes Upon inspection patient's wound bed actually showed signs of being close I do not see anything actually open at this point although she does have an issue below the umbilical area where there is some irritation and even appearance of some softening of the tissue more superficially. I am afraid that she is can have an abscess of but appears well. I discussed with her that I think she really needs to be seen as quickly as possible and probably the ER is the best way to go she may need some IV antibiotics and possible even incision and drainage to clean out the areas. I do not think this is going to go away just with standard wound care. Electronic Signature(s) Signed: 06/18/2023 9:42:37 AM By: Sarah Derry PA-C Entered By: [name] on 06/18/2023 09:42:37 [name] ([id number]) [id number] Page 3 of 6 -------------------------------------------------------------------------------- Physician Orders Details Patient Name: Date of Service: [name], Sarah  NN Stout. 06/18/2023 8:30 A M Medical Record Number: [id number] Patient Account Number: [id number] Date of Birth/Sex: Treating RN: Stout[birth year] (57 y.o. 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 - recommend going to [facility] ED Medications-Please add to medication list. ntibiotic - continue using Mupirocin and bandaid to areas Topical A Electronic Signature(s) Signed: 06/18/2023 12:04:52 PM By: Sarah Derry PA-C Signed: 06/24/2023 8:42:03 AM By: [name] Entered By: [name] on 06/18/2023 09:38:37 -------------------------------------------------------------------------------- Problem List Details Patient Name: Date of Service: [name] NN Stout. 06/18/2023 8:30 A M Medical Record Number: [id number] Patient Account Number: [id number] Date of Birth/Sex: Treating RN: Stout[birth year] (57 y.o. 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 06/18/2023 8:30 A M Medical Record Number: [id number] Patient Account Number: [id number] Date of Birth/Sex: Treating RN: Stout[birth year] (57 y.o. [name] Primary Care Provider: Rennie Stout Other Clinician: Betha Stout Referring Provider: Treating Provider/Extender: [name] in Treatment: 3 Subjective Chief Complaint Information obtained from Patient Surgical wound abdominal region History of Present Illness (HPI) 05-28-2023 this is a note that is being put in late secondary to the fact that computers were down across the world due to a [employer] outage. Therefore there may be some lack of detail compared to what would normally be present in the notes. With that being said the patient tells me at this point that she has been having some issues today with a wound over the periumbilical region. Subsequently this is a previous site of surgery and she has unfortunately not been able to get this to completely heal. It stay closed for quite a bit of time in fact the initial surgery was a bariatric surgery 2 years ago. 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 It is unclear as  to why this may have started giving her trouble now at this point. With that being said she has contacted the  original surgeon who has refused to see her. She is also been referred to plastic surgery who refused to see her locally. Subsequently I recommended the possibility of seeing if [name] or [name] could review her case apparently they already have and have refused to take on her case. This is a very difficult situation that to be honest I think is going to probably require some type of surgical intervention I am just unsure of exactly what is going to be necessary. I discussed all this with the patient today. 7/26 this patient was admitted to the clinic last week. She has a small probing wound in the central abdomen and epigastric area. We used Hydrofera Blue packing to this area. The patient has been changed changing the dressing herself. I believe she is also on antibiotics although I do not see any culture results currently. She is followed carefully by her primary doctor [name]. She had a CT scan of her abdomen on 7/25 which showed an enlarging fluid collection in the right lower quadrant compared to a previous film currently 14.5 x 10.3. There is also a fluid collection measuring 3.8 x 2 in the area above the umbilicus which is the area where her small probing wound is. [name] referred the patient to Dr. Ulice Stout of plastic surgery in [place]. Apparently the patient states she has attempted to get in with general surgery in [place] but they will not see her. Her original panniculectomy surgery was done by Dr. Charmian Stout in [place] however the patient refuses to go back to see him or his practice. According to the patient she has been unable to find another Editor, commissioning either general or plastics. The patient had a gastric bypass several years ago and then subsequently went on to have a panniculectomy. Somehow she has been left with a small open wound in the central  part of her abdomen just caudal to the umbilicus. She has the fluid collections noted above. She says she continues to have drainage out [name], [name] ([id number]) [id number] Page 5 of 6 of this small area. She has abdominal pain which feels like a tearing sensation in her lower mid abdomen. She does not have any acute sounding GI issues however. 06-11-2023 upon evaluation today patient appears to be doing much better in regard to the overall external appearance of her wound that we know that she still has the abscesses internally. Unfortunately based on what we are seeing right now would not even need to be able to get a sense Hydrofera Blue rope dressing down into this wound which is quite unfortunate. 06-18-2023 upon evaluation today patient actually does not have anything open at this point. With that being said unfortunately she is continue to have issues with welling and even redness below the umbilical area as well as in the original site that we were helping her with. Nonetheless I do not think that this issue is resolved I think it is just building underneath and this is unfortunate. I do think that she probably needs to be seen in the ER for further evaluation of this to try to speed along some kind of improvement here. She voiced understanding. So far we have been unsuccessful getting her scheduled with anybody. Objective Constitutional Well-nourished and well-hydrated in no acute distress. Vitals Time Taken: 8:52 AM, Height: 68 in, Weight: 175 lbs, BMI: 26.6, Temperature: 98.1 F, Pulse: 62 bpm, Respiratory Rate: 16 breaths/min, Blood Pressure: 145/86 mmHg. Respiratory normal breathing without difficulty. Psychiatric this  patient is able to make decisions and demonstrates good insight into disease process. Alert and Oriented x 3. pleasant and cooperative. General Notes: Upon inspection patient's wound bed actually showed signs of being close I do not see  anything actually open at this point although she does have an issue below the umbilical area where there is some irritation and even appearance of some softening of the tissue more superficially. I am afraid that she is can have an abscess of but appears well. I discussed with her that I think she really needs to be seen as quickly as possible and probably the ER is the best way to go she may need some IV antibiotics and possible even incision and drainage to clean out the areas. I do not think this is going to go away just with standard wound care. Integumentary (Hair, Skin) Wound #1 status is Healed - Epithelialized. Original cause of wound was Surgical Injury. The date acquired was: 05/09/2022. The wound has been in treatment 3 weeks. The wound is located on the Proximal,Midline Abdomen - midline. The wound measures 0cm length x 0cm width x 0cm depth; 0cm^2 area and 0cm^3 volume. There is Fat Layer (Subcutaneous Tissue) exposed. There is a none present amount of drainage noted. There is no granulation within the wound bed. There is no necrotic tissue within the wound bed. 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 - recommend going to [facility] ED Medications-Please add to medication list.: Topical Antibiotic - continue using Mupirocin and bandaid to areas 1. I would recommend currently that we have the patient continue to monitor for any signs of infection or worsening. Obviously she develops any fevers or chills this would be more urgent to get to the ER. 2. I am then recommend as well that she  should actually go to the ER today for further evaluation my opinion is that that is the best way to get something done more quickly. They would have to at least further evaluate what is going on from the standpoint of imaging and then depending on what is being seen that making adjustments in care is necessary at that point. She voiced understanding. We will see her back for follow-up visit as needed. [name], [name] ([id number]) [phone].pdf Page 6 of 6 Electronic Signature(s) Signed: 06/18/2023 9:43:20 AM By: Sarah Derry PA-C Entered By: [name] on 06/18/2023 09:43:20 -------------------------------------------------------------------------------- SuperBill Details Patient Name: Date of Service: [name] NN Stout. 06/18/2023 Medical Record Number: [id number] Patient Account Number: [id number] Date of Birth/Sex: Treating RN: Sarah [birth year], Stout (57 y.o. 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 Facility Procedures : CPT4 Stout: [id number] Description: 33295 - WOUND CARE VISIT-LEV 2 EST PT Modifier: Quantity: 1 Physician Procedures : CPT4 Stout Description Modifier [id number] - WC PHYS LEVEL 3 - EST PT ICD-10 Diagnosis Description T81.31XA Disruption of external operation (surgical) wound, not elsewhere  classified, initial encounter L98.492 Non-pressure chronic ulcer of skin of  other sites with fat layer exposed Z98.84 Bariatric surgery status K74.3 Primary biliary cirrhosis Quantity: 1 Electronic Signature(s) Signed:  06/18/2023 9:44:18 AM By: Sarah Derry PA-C Previous Signature: 06/18/2023 9:43:54 AM Version By: Sarah Derry PA-C Entered By: [name] on 06/18/2023 09:44:18

## 2023-06-18 NOTE — Telephone Encounter (Signed)
error 

## 2023-06-23 ENCOUNTER — Encounter: Payer: Self-pay | Admitting: Internal Medicine

## 2023-06-23 ENCOUNTER — Ambulatory Visit: Payer: Managed Care, Other (non HMO) | Admitting: Internal Medicine

## 2023-06-23 VITALS — BP 132/84 | HR 63 | Ht 68.0 in | Wt 168.2 lb

## 2023-06-23 DIAGNOSIS — E042 Nontoxic multinodular goiter: Secondary | ICD-10-CM

## 2023-06-23 NOTE — Progress Notes (Unsigned)
Name: [name]  MRN/ DOB: [id number], January 12, 1966    Age/ Sex: 57 y.o., female     PCP: Allegra Grana, FNP   Reason for Endocrinology Evaluation: Thyroid nodule /osteoporosis     Initial Endocrinology Clinic Visit: 06/26/2019    PATIENT IDENTIFIER: [name] is a 57 y.o., female with a past medical history of HTN,T2DM and Hx of gastric bypass and primary biliary cholangitis . She has followed with [facility] since 06/26/2019 for consultative assistance with management of her thyroid nodules .   HISTORICAL SUMMARY:  Was diagnosed with MNG in 2006. Per pt she had drainage of a thyroid cyst with benign cytology. In 2020 she presented to her PCP with c/o worsening neck enlargement, associated with dysphagia,and discomfort. An ultrasound showed a left thyroid nodule that met criteria for FNA. The nodule was smaller then it has been years ago and the pt opted not to proceed with FNA .     Osteoporosis History: She is S/P Gastric Bypass sx in  07/2016 , Pre-surgery weight was 333 Lbs, lowest weight since sx 150 lbs. She was on 2 bariatric vitamins up to a year ago and was stopped due to recurrent renal stones, stopping the vitamins did not help and had ~ 17 renal stones in the past year. Pt had a DXA 04/14/2019 with a T-Score of -3.0 which prompted her to start taking her vitamins .  Pt opted to avoid all anti-resorptive medications due to fear of side effects , we did offer Prolia, zoledronic acid, and Evenity   SUBJECTIVE:     Today (06/23/2023):  [name] is here for a follow up on  MNG.  She continues to follow up at [facility] for primary biliary cholangitis. On ursodiol.  She will be scheduled for EGD/colonoscopy  She is undergoing wound care for multiple abdominal wall wounds , unfortunately refills to general surgeons have been declined, her plastic surgeon is also declining to perform surgery, she has a pending referral to  [facility].   Denies any local neck enlargement Denies dysphagia  Denies constipation or diarrhea  Has occasional tremors , was evaluated   Bariatric advantage 2 tabs daily  ( each tab contains 85 mg of calcium and 1500 iu of vitamin D3)  4 of the Raw calcium ( 1600 iu vitamin D and 1100 mg of calcium)  Vitamin D 2000 iu  Vitamin A 10,000    HISTORY:  Past Medical History:  Past Medical History:  Diagnosis Date   Asthma due to seasonal allergies    no inhaler   History of diabetes mellitus, type II    per pt resolved after bariatric surgery 2017   History of hypertension    per pt resolved after bariatric surgery 2017   History of kidney stones    History of obstructive sleep apnea    per pt hx osa w/ cpap,  retested after bariatric surgery 2017 result negative   History of skin cancer    nose, not melanoma   Hydronephrosis, right    Hyperlipidemia    MRSA carrier    NAFL (nonalcoholic fatty liver)    Osteoporosis    Primary biliary cirrhosis (HCC)    Primary biliary cirrhosis (HCC)    Renal calculi    per CT 04-18-2019 bilateral , non-obstructive   S/P bariatric surgery 08/03/2016   duodenal switch w/ hh repair   Ureteral calculus, right    Wears glasses    Past Surgical  History:  Past Surgical History:  Procedure Laterality Date   AUGMENTATION MAMMAPLASTY Bilateral 07/2021   Implants and lift   AUGMENTATION MAMMAPLASTY Bilateral 07/05/2022   Dr Kizzie Bane, replaced implants   BARIATRIC SURGERY  08-03-2016    dr Smitty Cords @[facility] ,     LAPAROSCOPY DUODENAL SWITCH AND HIATAL HERNIA REPAIR   BRACHIOPLASTY     BREAST BIOPSY Right 2017   FIBROCYSTIC CHANGES WITH CALCIFICATIONS   CESAREAN SECTION  x2  last one 09-10-2000   COLONOSCOPY WITH PROPOFOL N/A 09/11/2019   Procedure: COLONOSCOPY WITH PROPOFOL;  Surgeon: Wyline Mood, MD;  Location: [facility] ENDOSCOPY;  Service: Gastroenterology;  Laterality: N/A;   CYSTOSCOPY WITH RETROGRADE PYELOGRAM, URETEROSCOPY AND STENT PLACEMENT  Right 02-07-2012  @[facility]    CYSTOSCOPY WITH STENT PLACEMENT Right 09/10/2017   Procedure: CYSTOSCOPY WITH STENT PLACEMENT;  Surgeon: Riki Altes, MD;  Location: [facility] ORS;  Service: Urology;  Laterality: Right;   CYSTOSCOPY/URETEROSCOPY/HOLMIUM LASER/STENT PLACEMENT Right 04/24/2019   Procedure: CYSTOSCOPY/URETEROSCOPY/STENT PLACEMENT/ RIGHT RETROGRADE;  Surgeon: Jerilee Field, MD;  Location: [facility];  Service: Urology;  Laterality: Right;   DX LAPAROSCOPY/  EGD/  HIATAL HERNIA REPAIR CLOSURE OF INTERNAL MENSENTRY DEFECT  10-28-2017   @[facility] ,  [place]   EXTRACORPOREAL SHOCK WAVE LITHOTRIPSY N/A 01/30/2019   Procedure: EXTRACORPOREAL SHOCK WAVE LITHOTRIPSY (ESWL) LEFT/ POSSIBLE RIGHT;  Surgeon: Jerilee Field, MD;  Location: [facility] ORS;  Service: Urology;  Laterality: N/A;   EXTRACORPOREAL SHOCK WAVE LITHOTRIPSY Right 03/20/2019   Procedure: EXTRACORPOREAL SHOCK WAVE LITHOTRIPSY (ESWL);  Surgeon: Marcine Matar, MD;  Location: [facility] ORS;  Service: Urology;  Laterality: Right;   EXTRACORPOREAL SHOCK WAVE LITHOTRIPSY Left 10/30/2021   Procedure: EXTRACORPOREAL SHOCK WAVE LITHOTRIPSY (ESWL);  Surgeon: Crist Fat, MD;  Location: [facility];  Service: Urology;  Laterality: Left;   LAPAROSCOPIC CHOLECYSTECTOMY  10-15-2016   @[facility]  ,  [place]   LIPOSUCTION     PANNICULECTOMY     PLACEMENT OF BREAST IMPLANTS Bilateral    TONSILLECTOMY AND ADENOIDECTOMY  child   URETEROSCOPY WITH HOLMIUM LASER LITHOTRIPSY Right 09/10/2017   Procedure: URETEROSCOPY WITH HOLMIUM LASER LITHOTRIPSY;  Surgeon: Riki Altes, MD;  Location: [facility] ORS;  Service: Urology;  Laterality: Right;   Social History:  reports that she has never smoked. She has never used smokeless tobacco. She reports that she does not drink alcohol and does not use drugs. Family History:  Family History  Problem Relation Age of Onset   Seizures Mother    Alcohol abuse Mother    Hyperlipidemia Mother     Hypertension Mother    Lymphoma Mother        primary site : brain   Leukemia Mother    Parkinsonism Father    Hyperlipidemia Father    Hypertension Father    Parkinson's disease Father    Drug abuse Sister    Cancer Maternal Grandmother        breast cancer   Breast cancer Maternal Grandmother 52   Liver cancer Paternal Grandfather    Kidney cancer Neg Hx    Prostate cancer Neg Hx      HOME MEDICATIONS: Allergies as of 06/23/2023       Reactions   Ivp Dye [iodinated Contrast Media]    Patient 'codes' with IV contrast   Latex Shortness Of Breath, Cough   Tolmetin Hives   Gallium Nitrate Hives   AND Gadolinium Derivatives   Insulin Detemir Hives, Swelling   Lump at injection site   Nsaids Hives  Per pt stated has to take ibuprofen with a antihistimine   Gadolinium Derivatives Hives   Pt c/o itching after contrast injection 03/15/19 @ 8:45am. [facility] Per Dr. Fredia Sorrow document this as allergic reaction.         Medication List        Accurate as of June 23, 2023  9:33 AM. If you have any questions, ask your nurse or doctor.          STOP taking these medications    buPROPion 150 MG 24 hr tablet Commonly known as: Wellbutrin XL Stopped by: Johnney Ou        TAKE these medications    BARIATRIC MULTIVITAMINS/IRON PO Take by mouth.   calcium citrate 950 (200 Ca) MG tablet Commonly known as: CALCITRATE - dosed in mg elemental calcium Take 200 mg of elemental calcium by mouth daily.   CALCIUM PO Take by mouth.   estradiol 0.1 MG/GM vaginal cream Commonly known as: ESTRACE PLACE 1/4 APPLICATORFUL IN THE VAGINA AT BEDTIME   FRUITY CHEWS/IRON PO Take by mouth.   mupirocin ointment 2 % Commonly known as: BACTROBAN Apply 1 application topically 2 (two) times daily. X 5 days nose   PreviDent 5000 Booster Plus 1.1 % Pste Generic drug: Sodium Fluoride Place onto teeth as directed.   saw palmetto 80 MG capsule Take 80 mg by mouth 2 (two)  times daily.   sertraline 25 MG tablet Commonly known as: ZOLOFT Take 25-50 mg by mouth daily as needed (itching).   ursodiol 250 MG tablet Commonly known as: ACTIGALL Take 250 mg by mouth in the morning, at noon, in the evening, and at bedtime.   VITAMIN A PO Take by mouth daily.   VITAMIN D3 PO Take by mouth daily.   vitamin E 180 MG (400 UNITS) capsule Take 400 Units by mouth daily.   vitamin k 100 MCG tablet Take 100 mcg by mouth daily.          OBJECTIVE:   PHYSICAL EXAM: VS: BP 132/84 (BP Location: Left Arm, Patient Position: Sitting, Cuff Size: Small)   Pulse 63   Ht 5\' 8"  (1.727 m)   Wt 168 lb 3.2 oz (76.3 kg)   LMP 07/11/2016 (Exact Date)   SpO2 99%   BMI 25.57 kg/m    EXAM: General: Pt appears well and is in NAD  Neck: General: Supple without adenopathy. Thyroid: Thyroid size normal. Left  nodule appreciated.   Lungs: Clear with good BS bilat with no rales, rhonchi, or wheezes  Heart: Auscultation: RRR.  Abdomen:  soft, nontender, epigastric abdominal wound noted with clear discharge and another noted below the umbilicus  Extremities:  BL YQ:IHKVQ  pretibial edema   Mental Status: Judgment, insight: Intact Orientation: Oriented to time, place, and person Mood and affect: No depression, anxiety, or agitation     DATA REVIEWED:  ****    Thyroid Ultrasound 07/22/2021 Estimated total number of nodules >/= 1 cm: 2   Number of spongiform nodules >/=  2 cm not described below (TR1): 0   Number of mixed cystic and solid nodules >/= 1.5 cm not described below (TR2): 0   _________________________________________________________   Nodule # 1:   Prior biopsy: No   Location: Right; Inferior   Maximum size: 1.1 cm; Other 2 dimensions: 0.9 x 0.6 cm, previously, 1.1 x 0.9 x 0.7 cm   Composition: solid/almost completely solid (2)   Echogenicity: hypoechoic (2)   Shape: not taller-than-wide (0)   Margins: ill-defined (  0)   Echogenic  foci: none (0)   ACR TI-RADS total points: 4.   ACR TI-RADS risk category:  TR4 (4-6 points).   Significant change in size (>/= 20% in two dimensions and minimal increase of 2 mm): No   Change in features: No   Change in ACR TI-RADS risk category: No   ACR TI-RADS recommendations:   *Given size (>/= 1 - 1.4 cm) and appearance, a follow-up ultrasound in 1 year should be considered based on TI-RADS criteria.   _________________________________________________________   Nodule # 3: Previously biopsied nodule in the left inferior gland remains stable at 2.9 x 1.7 x 1.7 cm.   No new nodules or suspicious features.   IMPRESSION: 1. Continued stability of previously biopsied nodule in the left inferior gland. 2. Confirmed 2 year stability of right lower pole TI-RADS category 4 nodule. Recommend continued ultrasound surveillance with annual ultrasound follow-up until 5 year stability has been confirmed. 3. No new nodules or suspicious features.     FNA 09/18/2020 Clinical History: Left; Inferior 2.7 cm;  Other 2 dimensions: 1.9 x 2  cm; TI-RADS - 4  Specimen Submitted:  A. THYROID GLAND, LEFT, LLP, FINE NEEDLE  ASPIRATION:    FINAL MICROSCOPIC DIAGNOSIS:  - Consistent with benign follicular nodule ([place] category II)      ASSESSMENT / PLAN / RECOMMENDATIONS:   Multinodular Goiter:    - Pt is clinically euthyroid  - No local neck symptoms  - FNA of the left inferior nodule was benign 07/25/2020       F/U in 1 yr     Signed electronically by: Lyndle Herrlich, MD  [facility]  [facility] [address] [place] [place], [place] [address] Phone: [phone] FAX: [phone]      CC: Allegra Grana, FNP [address] [place] [address] Phone: [phone]  Fax: [phone]   Return to Endocrinology clinic as below: Future Appointments  Date Time Provider Department Center  06/24/2023   2:45 PM Allen Derry [place] III, PA-C [facility] None  07/20/2023  9:15 AM [place], Alena Bills, DO [facility] None

## 2023-06-24 ENCOUNTER — Encounter: Payer: Managed Care, Other (non HMO) | Admitting: Physician Assistant

## 2023-06-24 DIAGNOSIS — T8131XA Disruption of external operation (surgical) wound, not elsewhere classified, initial encounter: Secondary | ICD-10-CM | POA: Diagnosis not present

## 2023-06-24 LAB — TSH: TSH: 1.44 u[IU]/mL (ref 0.450–4.500)

## 2023-06-24 LAB — T4, FREE: Free T4: 0.9 ng/dL (ref 0.82–1.77)

## 2023-06-24 NOTE — Progress Notes (Addendum)
[name] Stout ([id number]) [id number] Page 1 of 7 Visit Report for 06/24/2023 Chief Complaint Document Details Patient Name: Date of Service: [name], [name]. 06/24/2023 2:45 PM Medical Record Number: [id number] Patient Account Number: [id number] Date of Birth/Sex: Treating RN: May 11, 1966 (57 y.o. Skip Mayer Primary Care Provider: Rennie Plowman Other Clinician: Betha Loa Referring Provider: Treating Provider/Extender: Orie Rout in Treatment: 3 Information Obtained from: Patient Chief Complaint Surgical wound abdominal region Electronic Signature(s) Signed: 06/24/2023 2:50:34 PM By: Allen Derry PA-C Entered By: Allen Derry on 06/24/2023 14:50:33 -------------------------------------------------------------------------------- HPI Details Patient Name: Date of Service: [name] NN Stout. 06/24/2023 2:45 PM Medical Record Number: [id number] Patient Account Number: [id number] Date of Birth/Sex: Treating RN: 07-12-1966 (57 y.o. Skip Mayer Primary Care Provider: Rennie Plowman Other Clinician: Betha Loa Referring Provider: Treating Provider/Extender: Orie Rout in Treatment: 3 History of Present Illness HPI Description: 05-28-2023 this is a note that is being put in late secondary to the fact that computers were down across the world due to a [employer] outage. Therefore there may be some lack of detail compared to what would normally be present in the notes. With that being said the patient tells me at this point that she has been having some issues today with a wound over the periumbilical region. Subsequently this is a previous site of surgery and she has unfortunately not been able to get this to completely heal. It stay closed for quite a bit of time in fact the initial surgery was a bariatric surgery 2 years ago. 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 It is unclear as to why this may have started giving her trouble now at this point. With that being said she has contacted the original surgeon who has refused to see her. She is also been referred to plastic surgery who refused to see her locally. Subsequently I recommended the possibility of seeing if [name] or [name] could review her case apparently they already have and have refused to take on her case. This is a very difficult situation that to be honest I think is going to probably require some type of surgical intervention I am just unsure of exactly what is going to be necessary. I discussed all this with the patient today. 7/26 this patient was admitted to the clinic last week. She has a small probing wound in the central abdomen and epigastric area. We used Hydrofera Blue packing to this area. The patient has been changed changing the dressing herself. I believe she is also on antibiotics although I do not see any culture results currently. She is followed carefully by her primary doctor [name]. She had a CT scan of her abdomen on 7/25 which showed an enlarging fluid collection in the right lower quadrant compared to a previous film currently 14.5 x 10.3. There is also a fluid collection measuring 3.8 x 2 in the area above the umbilicus which is the area where her small probing wound is. [name] referred the patient to [name] of plastic surgery in [place]. Apparently the  patient [name], [name] ([id number]) [id number] Page 2 of 7 states she has attempted to get in with general surgery in [place] but they  will not see her. Her original panniculectomy surgery was done by [name] in [place] however the patient refuses to go back to see him or his practice. According to the patient she has been unable to find another Editor, commissioning either general or plastics. The patient had a gastric bypass several years ago and then subsequently went on to have a panniculectomy. Somehow she has been left with a small open wound in the central part of her abdomen just caudal to the umbilicus. She has the fluid collections noted above. She says she continues to have drainage out of this small area. She has abdominal pain which feels like a tearing sensation in her lower mid abdomen. She does not have any acute sounding GI issues however. 06-11-2023 upon evaluation today patient appears to be doing much better in regard to the overall external appearance of her wound that we know that she still has the abscesses internally. Unfortunately based on what we are seeing right now would not even need to be able to get a sense Hydrofera Blue rope dressing down into this wound which is quite unfortunate. 06-18-2023 upon evaluation today patient actually does not have anything open at this point. With that being said unfortunately she is continue to have issues with welling and even redness below the umbilical area as well as in the original site that we were helping her with. Nonetheless I do not think that this issue is resolved I think it is just building underneath and this is unfortunate. I do think that she probably needs to be seen in the ER for further evaluation of this to try to speed along some kind of improvement here. She voiced understanding. So far we have been unsuccessful getting her scheduled with anybody. 06-24-2023 upon evaluation today patient unfortunately appears to be doing worse in regard to her wound. She has been tolerating the dressing changes without complication. However the wound at the top still  continues to drain though is not big enough to really get anything down into. The 1 in the lower portion of the abdomen just below the umbilical region actually has open at this point this is the first time has been open and again it is draining quite a bit as well. We will be able to get some Hydrofera Blue rope in the lower but the upper this is not can be possible. Electronic Signature(s) Signed: 06/24/2023 3:48:55 PM By: Allen Derry PA-C Entered By: Allen Derry on 06/24/2023 15:48:55 -------------------------------------------------------------------------------- Physical Exam Details Patient Name: Date of Service: [name] NN Stout. 06/24/2023 2:45 PM Medical Record Number: [id number] Patient Account Number: [id number] Date of Birth/Sex: Treating RN: May 08, 1966 (57 y.o. Skip Mayer Primary Care Provider: Rennie Plowman Other Clinician: Betha Loa Referring Provider: Treating Provider/Extender: Orie Rout in Treatment: 3 Constitutional Well-nourished and well-hydrated in no acute distress. Respiratory normal breathing without difficulty. Psychiatric this patient is able to make decisions and demonstrates good insight into disease process. Alert and Oriented x 3. pleasant and cooperative. Notes Upon inspection patient's wounds again are still showing signs of having some significant undermining and issues here that I am not sure going to be able to be managed just from a wound care perspective. I really feel like that she may require surgical intervention to see this healed and I discussed this in the  past with her she does have an appointment with [name] I believe this is 10 September she tells me. Electronic Signature(s) Signed: 06/24/2023 3:49:41 PM By: Allen Derry PA-C Entered By: Allen Derry on 06/24/2023 15:49:41 [name] ([id number]) [id number] Page 3 of  7 -------------------------------------------------------------------------------- Physician Orders Details Patient Name: Date of Service: [name], Sarah NN Stout. 06/24/2023 2:45 PM Medical Record Number: [id number] Patient Account Number: [id number] Date of Birth/Sex: Treating RN: [birth year] (57 y.o. 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 Bathing/ Shower/ Hygiene Clean wound with Normal Saline or wound cleanser. Wash wounds with antibacterial soap and water. Wound Treatment Wound #1R - Abdomen - midline Wound Laterality: Midline, Proximal Cleanser: Vashe 5.8 (oz) Discharge Instructions: Use vashe 5.8 (oz) as directed Topical: Mupirocin Ointment Discharge Instructions: Apply as directed by provider. Secondary Dressing: Coverlet Latex-Free Fabric Adhesive Dressings Discharge Instructions: 1.5 x 2 Wound #2 - Abdomen - midline Wound Laterality: Distal Cleanser: Vashe 5.8 (oz) Discharge Instructions: Use vashe 5.8 (oz) as directed Prim Dressing: Hydrofera Blue Classic Foam Rope Dressing, 9x6 (mm/in) ary Discharge Instructions: cut 1/2 and insert into tunnel Secondary Dressing: (BORDER) Zetuvit Plus SILICONE BORDER Dressing 4x4 (in/in) Discharge Instructions: Please do not put silicone bordered dressings under wraps. Use non-bordered dressing only. Electronic Signature(s) Signed: 06/24/2023 5:16:10 PM By: Betha Loa Signed: 06/25/2023 3:00:55 PM By: Allen Derry PA-C Entered By: Betha Loa on 06/24/2023 15:45:01 [name] ([id number]) [id number] Page 4 of 7 -------------------------------------------------------------------------------- Problem List Details Patient Name: Date of Service: [name], Sarah NN Stout. 06/24/2023 2:45 PM Medical Record Number: [id number] Patient Account Number: [id number] Date of Birth/Sex: Treating RN: [birth year] (57 y.o. Skip Mayer Primary Care Provider: Rennie Plowman Other Clinician: Betha Loa Referring Provider: Treating Provider/Extender: Orie Rout in Treatment: 3 Active Problems ICD-10 Encounter Stout Description Active Date MDM Diagnosis T81.31XA Disruption of external operation (surgical) wound, not elsewhere classified, 06/04/2023 No Yes initial encounter L98.492 Non-pressure chronic ulcer of skin of other sites with fat layer exposed 06/11/2023 No Yes Z98.84 Bariatric surgery status 06/11/2023 No Yes K74.3 Primary biliary cirrhosis 06/11/2023 No Yes Inactive Problems Resolved Problems Electronic Signature(s) Signed: 06/24/2023 2:50:30 PM By: Allen Derry PA-C Entered By: Allen Derry on 06/24/2023 14:50:30 -------------------------------------------------------------------------------- Progress Note Details Patient Name: Date of Service: [name] NN Stout. 06/24/2023 2:45 PM Medical Record Number: [id number] Patient Account Number: [id number] Date of Birth/Sex: Treating RN: 1966-03-18 (57 y.o. Skip Mayer Primary Care Provider: Rennie Plowman Other Clinician: Betha Loa Referring Provider: Treating Provider/Extender: Orie Rout in Treatment: 3 [name], [name] ([id number]) [id number] Page 5 of 7 Subjective Chief Complaint Information obtained from Patient Surgical wound abdominal region History of Present Illness  (HPI) 05-28-2023 this is a note that is being put in late secondary to the fact that computers were down across the world due to a [employer] outage. Therefore there may be some lack of detail compared to what would normally be present in the notes. With that being said the patient tells me at this point that she  has been having some issues today with a wound over the periumbilical region. Subsequently this is a previous site of surgery and she has unfortunately not been able to get this to completely heal. It stay closed for quite a bit of time in fact the initial surgery was a bariatric surgery 2 years ago. 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 It is unclear as to why this may have started giving her trouble now at this point. With that being said she has contacted the original surgeon who has refused to see her. She is also been referred to plastic surgery who refused to see her locally. Subsequently I recommended the possibility of seeing if [name] or [name] could review her case apparently they already have and have refused to take on her case. This is a very difficult situation that to be honest I think is going to probably require some type of surgical intervention I am just unsure of exactly what is going to be necessary. I discussed all this with the patient today. 7/26 this patient was admitted to the clinic last week. She has a small probing wound in the central abdomen and epigastric area. We used Hydrofera Blue packing to this area. The patient has been changed changing the dressing herself. I believe she is also on  antibiotics although I do not see any culture results currently. She is followed carefully by her primary doctor [name]. She had a CT scan of her abdomen on 7/25 which showed an enlarging fluid collection in the right lower quadrant compared to a previous film currently 14.5 x 10.3. There is also a fluid collection measuring 3.8 x 2 in the area above the umbilicus which is the area where her small probing wound is. [name] referred the patient to [name] of plastic surgery in [place]. Apparently the patient states she has attempted to get in with general surgery in [place] but they will not see her. Her original panniculectomy surgery was done by [name] in [place] however the patient refuses to go back to see him or his practice. According to the patient she has been unable to find another Editor, commissioning either general or plastics. The patient had a gastric bypass several years ago and then subsequently went on to have a panniculectomy. Somehow she has been left with a small open wound in the central part of her abdomen just caudal to the umbilicus. She has the fluid collections noted above. She says she continues to have drainage out of this small area. She has abdominal pain which feels like a tearing sensation in her lower mid abdomen. She does not have any acute sounding GI issues however. 06-11-2023 upon evaluation today patient appears to be doing much better in regard to the overall external appearance of her wound that we know that she still has the abscesses internally. Unfortunately based on what we are seeing right now would not even need to be able to get a sense Hydrofera Blue rope dressing down into this wound which is quite unfortunate. 06-18-2023 upon evaluation today patient actually does not have anything  open at this point. With that being said unfortunately she is continue to have issues with welling and even redness below the umbilical area as well as in the  original site that we were helping her with. Nonetheless I do not think that this issue is resolved I think it is just building underneath and this is unfortunate. I do think that she probably needs to be seen in the ER for further evaluation of this to try to speed along some kind of improvement here. She voiced understanding. So far we have been unsuccessful getting her scheduled with anybody. 06-24-2023 upon evaluation today patient unfortunately appears to be doing worse in regard to her wound. She has been tolerating the dressing changes without complication. However the wound at the top still continues to drain though is not big enough to really get anything down into. The 1 in the lower portion of the abdomen just below the umbilical region actually has open at this point this is the first time has been open and again it is draining quite a bit as well. We will be able to get some Hydrofera Blue rope in the lower but the upper this is not can be possible. Objective Constitutional Well-nourished and well-hydrated in no acute distress. Vitals Time Taken: 2:46 PM, Height: 68 in, Weight: 175 lbs, BMI: 26.6, Temperature: 98.3 F, Pulse: 64 bpm, Respiratory Rate: 16 breaths/min, Blood Pressure: 172/94 mmHg. Respiratory normal breathing without difficulty. Psychiatric this patient is able to make decisions and demonstrates good insight into disease process. Alert and Oriented x 3. pleasant and cooperative. General Notes: Upon inspection patient's wounds again are still showing signs of having some significant undermining and issues here that I am not sure going to be able to be managed just from a wound care perspective. I really feel like that she may require surgical intervention to see this healed and I discussed this in the past with her she does have an appointment with [name] I believe this is 10 September she tells me. Integumentary (Hair, Skin) Wound #1R status is Open. Original  cause of wound was Surgical Injury. The date acquired was: 05/09/2022. The wound has been in treatment 3 weeks. The wound is located on the Proximal,Midline Abdomen - midline. The wound measures 0.1cm length x 0.1cm width x 3cm depth; 0.008cm^2 area and 0.024cm^3 volume. There is Fat Layer (Subcutaneous Tissue) exposed. There is a none present amount of drainage noted. There is no granulation within the wound bed. There is no necrotic tissue within the wound bed. Wound #2 status is Open. Original cause of wound was Surgical Injury. The date acquired was: 06/19/2023. The wound is located on the Distal Abdomen - midline. The wound measures 0.3cm length x 0.6cm width x 0.7cm depth; 0.141cm^2 area and 0.099cm^3 volume. There is Fat Layer (Subcutaneous Tissue) exposed. There is undermining starting at 12:00 and ending at 12:00 with a maximum distance of 3.8cm. There is a medium amount of serosanguineous drainage noted. 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 Bathing/ Shower/ Hygiene: Clean wound with Normal Saline or wound cleanser. Wash wounds with antibacterial soap and water. WOUND #1R: - Abdomen - midline Wound Laterality: Midline, Proximal Cleanser: Vashe 5.8 (oz) Discharge Instructions: Use vashe 5.8 (oz) as  directed Topical: Mupirocin Ointment Discharge Instructions: Apply as directed by provider. Secondary Dressing: Coverlet Latex-Free Fabric Adhesive Dressings Discharge Instructions: 1.5 x 2 WOUND #2: - Abdomen - midline Wound Laterality: Distal Cleanser: Vashe 5.8 (oz) Discharge Instructions: Use vashe 5.8 (oz) as directed Prim  Dressing: Hydrofera Blue Classic Foam Rope Dressing, 9x6 (mm/in) ary Discharge Instructions: cut 1/2 and insert into tunnel Secondary Dressing: (BORDER) Zetuvit Plus SILICONE BORDER Dressing 4x4 (in/in) Discharge Instructions: Please do not put silicone bordered dressings under wraps. Use non-bordered dressing only. 1. Based on what I am seeing I do believe that the patient would benefit from likely surgical intervention. Again I explained that I am not certain that this is gena be something that we will be able to be managed just from a wound care perspective at the moment that we will get a try to do what we can to pack into the lower portions of the wound. This is at least in regard to the lower wound where you cut the [facility] in half. 2. I am good recommend the patient continue use mupirocin and a Band-Aid of topical using the Zetuvit to cover the bottom. 3. I would also recommend that if she gets worse or starts develop any fever, chills, nausea, vomiting, or diarrhea she should go to the ER for further evaluation and treatment as soon as possible. We will see patient back for reevaluation in 1 week here in the clinic. If anything worsens or changes patient will contact our office for additional recommendations. Electronic Signature(s) Signed: 06/24/2023 3:50:23 PM By: Allen Derry PA-C Entered By: Allen Derry on 06/24/2023 15:50:22 -------------------------------------------------------------------------------- SuperBill Details Patient Name: Date of Service: [name] NN Stout. 06/24/2023 Medical Record Number: [id number] Patient Account Number: [id number] Date of Birth/Sex: Treating RN: September 29, 1966 (57 y.o. 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 K74.3 Primary biliary cirrhosis Facility Procedures : CPT4 Stout: [id number] Description: 99213 - WOUND CARE VISIT-LEV 3 EST PT Modifier: Quantity: 1 Physician Procedures : CPT4 Stout Description Modifier [id number] - WC PHYS LEVEL 4 - EST PT ICD-10 Diagnosis Description T81.31XA Disruption of external operation (surgical) wound, not elsewhere classified, initial encounter L98.492 Non-pressure chronic ulcer of skin of  other sites with fat layer exposed Z98.84 Bariatric surgery status K74.3 Primary biliary cirrhosis Quantity: 1 Electronic Signature(s) Signed: 06/24/2023 3:51:35 PM By: Allen Derry PA-C Entered By: Allen Derry on 06/24/2023 15:51:34

## 2023-07-01 ENCOUNTER — Ambulatory Visit: Payer: Managed Care, Other (non HMO) | Admitting: Physician Assistant

## 2023-07-08 ENCOUNTER — Ambulatory Visit: Payer: Managed Care, Other (non HMO) | Admitting: Internal Medicine

## 2023-07-08 NOTE — Progress Notes (Signed)
[name] ([id number]) [id number] Page 1 of 10 Visit Report for 06/24/2023 Arrival Information Details Patient Name: Date of Service: [name], [name]. 06/24/2023 2:45 PM Medical Record Number: [id number] Patient Account Number: [id number] Date of Birth/Sex: Treating RN: 01/10/1966 (57 y.o. Skip Mayer Primary Care Zaheer Wageman: Rennie Plowman Other Clinician: Betha Loa Referring Abas Leicht: Treating Vearl Allbaugh/Extender: Orie Rout in Treatment: 3 Visit Information History Since Last Visit All ordered tests and consults were completed: No Patient Arrived: Ambulatory Added or deleted any medications: No Arrival Time: 14:44 Any new allergies or adverse reactions: No Transfer Assistance: None Had a fall or experienced change in No Patient Identification Verified: Yes activities of daily living that may affect Secondary Verification Process Completed: Yes risk of falls: Patient Requires Transmission-Based Precautions: No Signs or symptoms of abuse/neglect since last visito No Patient Has Alerts: No Hospitalized since last visit: No Implantable device outside of the clinic excluding No cellular tissue based products placed in the center since last visit: Has Dressing in Place as Prescribed: Yes Pain Present Now: Yes Electronic Signature(s) Signed: 06/24/2023 5:16:10 PM By: Betha Loa Entered By: Betha Loa on 06/24/2023 14:45:40 -------------------------------------------------------------------------------- Clinic Level of Care Assessment Details Patient Name: Date of Service: [name], [name]. 06/24/2023 2:45 PM Medical Record Number: [id number] Patient Account Number: [id number] Date of Birth/Sex: Treating RN: 1966/02/05 (57 y.o. Skip Mayer Primary Care Venezia Sargeant: Rennie Plowman Other Clinician: Betha Loa Referring Winry Egnew: Treating Mela Perham/Extender: Orie Rout in  Treatment: 3 Clinic Level of Care Assessment Items TOOL 4 Quantity Score []  - 0 Use when only an EandM is performed on FOLLOW-UP visit ASSESSMENTS - Nursing Assessment / Reassessment X- 1 10 Reassessment of Co-morbidities (includes updates in patient status) X- 1 5 Reassessment of Adherence to Treatment Plan [name], [name] ([id number]) [phone].pdf Page 2 of 10 ASSESSMENTS - Wound and Skin A ssessment / Reassessment []  - 0 Simple Wound Assessment / Reassessment - one wound X- 2 5 Complex Wound Assessment / Reassessment - multiple wounds []  - 0 Dermatologic / Skin Assessment (not related to wound area) ASSESSMENTS - Focused Assessment []  - 0 Circumferential Edema Measurements - multi extremities []  - 0 Nutritional Assessment / Counseling / Intervention []  - 0 Lower Extremity Assessment (monofilament, tuning fork, pulses) []  - 0 Peripheral Arterial Disease Assessment (using hand held doppler) ASSESSMENTS - Ostomy and/or Continence Assessment and Care []  - 0 Incontinence Assessment and Management []  - 0 Ostomy Care Assessment and Management (repouching, etc.) PROCESS - Coordination of Care X - Simple Patient / Family Education for ongoing care 1 15 []  - 0 Complex (extensive) Patient / Family Education for ongoing care []  - 0 Staff obtains Chiropractor, Records, T Results / Process Orders est []  - 0 Staff telephones HHA, Nursing Homes / Clarify orders / etc []  - 0 Routine Transfer to another Facility (non-emergent condition) []  - 0 Routine Hospital Admission (non-emergent condition) []  - 0 New Admissions / Manufacturing engineer / Ordering NPWT Apligraf, etc. , []  - 0 Emergency Hospital Admission (emergent condition) X- 1 10 Simple Discharge Coordination []  - 0 Complex (extensive) Discharge Coordination PROCESS - Special Needs []  - 0 Pediatric / Minor Patient Management []  - 0 Isolation Patient Management []  - 0 Hearing / Language /  Visual special needs []  - 0 Assessment of Community assistance (transportation, Stout/C planning, etc.) []  - 0 Additional assistance / Altered mentation []  - 0 Support Surface(s) Assessment (bed, cushion, seat, etc.) INTERVENTIONS - Wound  Cleansing / Measurement []  - 0 Simple Wound Cleansing - one wound X- 2 5 Complex Wound Cleansing - multiple wounds X- 1 5 Wound Imaging (photographs - any number of wounds) []  - 0 Wound Tracing (instead of photographs) []  - 0 Simple Wound Measurement - one wound X- 2 5 Complex Wound Measurement - multiple wounds INTERVENTIONS - Wound Dressings X - Small Wound Dressing one or multiple wounds 2 10 []  - 0 Medium Wound Dressing one or multiple wounds []  - 0 Large Wound Dressing one or multiple wounds X- 1 5 Application of Medications - topical []  - 0 Application of Medications - injection INTERVENTIONS - Miscellaneous []  - 0 External ear exam [name], [name] ([id number]) [id number] Page 3 of 10 []  - 0 Specimen Collection (cultures, biopsies, blood, body fluids, etc.) []  - 0 Specimen(s) / Culture(s) sent or taken to Lab for analysis []  - 0 Patient Transfer (multiple staff / Michiel Sites Lift / Similar devices) []  - 0 Simple Staple / Suture removal (25 or less) []  - 0 Complex Staple / Suture removal (26 or more) []  - 0 Hypo / Hyperglycemic Management (close monitor of Blood Glucose) []  - 0 Ankle / Brachial Index (ABI) - do not check if billed separately X- 1 5 Vital Signs Has the patient been seen at the hospital within the last three years: Yes Total Score: 105 Level Of Care: New/Established - Level 3 Electronic Signature(s) Signed: 06/24/2023 5:16:10 PM By: Betha Loa Entered By: Betha Loa on 06/24/2023 15:46:19 -------------------------------------------------------------------------------- Encounter Discharge Information Details Patient Name: Date of Service: [name]. 06/24/2023 2:45  PM Medical Record Number: [id number] Patient Account Number: [id number] Date of Birth/Sex: Treating RN: [birth year] (57 y.o. [name], [name] Primary Care [name]: Rennie Plowman Other Clinician: Betha Loa Referring Renee Beale: Treating Le Faulcon/Extender: Orie Rout in Treatment: 3 Encounter Discharge Information Items Discharge Condition: Stable Ambulatory Status: Ambulatory Discharge Destination: Home Transportation: Private Auto Accompanied By: self Schedule Follow-up Appointment: Yes Clinical Summary of Care: Electronic Signature(s) Signed: 06/24/2023 5:16:10 PM By: Betha Loa Entered By: Betha Loa on 06/24/2023 15:47:36 Lower Extremity Assessment Details -------------------------------------------------------------------------------- [name] [name] ([id number]) [id number] Page 4 of 10 Patient Name: Date of Service: [name], [name]. 06/24/2023 2:45 PM Medical Record Number: [id number] Patient Account Number: [id number] Date of Birth/Sex: Treating RN: 09-May-1966 (57 y.o. Skip Mayer Primary Care Callen Vancuren: Rennie Plowman Other Clinician: Betha Loa Referring Carnell Beavers: Treating Remi Rester/Extender: Orie Rout in Treatment: 3 Electronic Signature(s) Signed: 06/24/2023 5:16:10 PM By: Betha Loa Signed: 07/07/2023 5:37:35 PM By: Elliot Gurney BSN, RN, CWS, Kim RN, BSN Entered By: Betha Loa on 06/24/2023 15:23:13 -------------------------------------------------------------------------------- Multi Wound Chart Details Patient Name: Date of Service: [name]. 06/24/2023 2:45 PM Medical Record Number: [id number] Patient Account Number: [id number] Date of Birth/Sex: Treating RN: 03/27/1966 (56 y.o. Skip Mayer Primary Care Zekiah Caruth: Rennie Plowman Other Clinician: Betha Loa Referring Taneya Conkel: Treating Manvi Guilliams/Extender: Orie Rout in  Treatment: 3 Vital Signs Height(in): 68 Pulse(bpm): 64 Weight(lbs): 175 Blood Pressure(mmHg): 172/94 Body Mass Index(BMI): 26.6 Temperature(F): 98.3 Respiratory Rate(breaths/min): 16 [1R:Photos: No Photos] [N/A:N/A] Proximal, Midline Abdomen - midline Distal Abdomen - midline N/A Wound Location: Surgical Injury Surgical Injury N/A Wounding Event: Dehisced Wound Atypical N/A Primary Etiology: Hypertension Hypertension N/A Comorbid History: 05/09/2022 06/19/2023 N/A Date Acquired: 3 0 N/A Weeks of Treatment: Open Open N/A Wound Status: Yes No N/A Wound Recurrence: 0.1x0.1x3 0.3x0.6x0.7 N/A Measurements L x W x Stout (cm) 0.008  0.141 N/A A (cm) : rea 0.024 0.099 N/A Volume (cm) : 88.70% N/A N/A % Reduction in A rea: 42.90% N/A N/A % Reduction in Volume: 12 Starting Position 1 (o'clock): 12 Ending Position 1 (o'clock): 3.8 Maximum Distance 1 (cm): N/A Yes N/A Undermining: Full Thickness Without Exposed Full Thickness Without Exposed N/A Classification: Support Structures Support Structures None Present Medium N/A Exudate Amount: N/A Serosanguineous N/A Exudate Type: N/A red, brown N/A Exudate Color: None Present (0%) N/A N/A Granulation Amount: None Present (0%) N/A N/A Necrotic Amount: [name], [name] ([id number]) [id number] Page 5 of 10 Fat Layer (Subcutaneous Tissue): Yes Fat Layer (Subcutaneous Tissue): Yes N/A Exposed Structures: Fascia: No Fascia: No Tendon: No Tendon: No Muscle: No Muscle: No Joint: No Joint: No Bone: No Bone: No Large (67-100%) None N/A Epithelialization: Treatment Notes Electronic Signature(s) Signed: 06/24/2023 5:16:10 PM By: Betha Loa Entered By: Betha Loa on 06/24/2023 15:33:51 -------------------------------------------------------------------------------- Multi-Disciplinary Care Plan Details Patient Name: Date of Service: [name]. 06/24/2023 2:45 PM Medical Record Number:  [id number] Patient Account Number: [id number] Date of Birth/Sex: Treating RN: 27-Feb-1966 (57 y.o. [name], [name] Primary Care Larnell Granlund: Rennie Plowman Other Clinician: Betha Loa Referring Marsi Turvey: Treating Daneisha Surges/Extender: Orie Rout in Treatment: 3 Active Inactive Wound/Skin Impairment Nursing Diagnoses: Knowledge deficit related to ulceration/compromised skin integrity Goals: Patient/caregiver will verbalize understanding of skin care regimen Date Initiated: 05/31/2023 Target Resolution Date: 06/28/2023 Goal Status: Active Ulcer/skin breakdown will have a volume reduction of 30% by week 4 Date Initiated: 05/31/2023 Target Resolution Date: 06/28/2023 Goal Status: Active Ulcer/skin breakdown will have a volume reduction of 50% by week 8 Date Initiated: 05/31/2023 Target Resolution Date: 07/29/2023 Goal Status: Active Ulcer/skin breakdown will have a volume reduction of 80% by week 12 Date Initiated: 05/31/2023 Target Resolution Date: 08/28/2023 Goal Status: Active Ulcer/skin breakdown will heal within 14 weeks Date Initiated: 05/31/2023 Target Resolution Date: 09/28/2023 Goal Status: Active Interventions: Assess patient/caregiver ability to obtain necessary supplies Assess patient/caregiver ability to perform ulcer/skin care regimen upon admission and as needed Assess ulceration(s) every visit Notes: Electronic Signature(s) Signed: 06/24/2023 5:16:10 PM By: Betha Loa Signed: 07/07/2023 5:37:35 PM By: Elliot Gurney, BSN, RN, CWS, Kim RN, BSN Entered By: Betha Loa on 06/24/2023 15:46:30 [name] [name] ([id number]) [id number] Page 6 of 10 -------------------------------------------------------------------------------- Pain Assessment Details Patient Name: Date of Service: [name], [name]. 06/24/2023 2:45 PM Medical Record Number: [id number] Patient Account Number: [id number] Date of Birth/Sex: Treating RN: 12-21-1965  (57 y.o. Skip Mayer Primary Care Deana Krock: Rennie Plowman Other Clinician: Betha Loa Referring Dsean Vantol: Treating Onnika Siebel/Extender: Orie Rout in Treatment: 3 Active Problems Location of Pain Severity and Description of Pain Patient Has Paino Yes Site Locations Pain Location: Pain in Ulcers Duration of the Pain. Constant / Intermittento Constant Rate the pain. Current Pain Level: 5 Character of Pain Describe the Pain: Stabbing, Other: pressure Pain Management and Medication Current Pain Management: Medication: Yes Cold Application: No Rest: No Massage: No Activity: No T.E.N.S.: No Heat Application: No Leg drop or elevation: No Is the Current Pain Management Adequate: Inadequate How does your wound impact your activities of daily livingo Sleep: No Bathing: No Appetite: No Relationship With Others: No Bladder Continence: No Emotions: No Bowel Continence: No Work: No Toileting: No Drive: No Dressing: No Hobbies: No Electronic Signature(s) Signed: 06/24/2023 5:16:10 PM By: Betha Loa Signed: 07/07/2023 5:37:35 PM By: Elliot Gurney, BSN, RN, CWS, Kim RN, BSN Entered By: Betha Loa on 06/24/2023 14:48:07 [name] [name] (  [id number]) [id number] Page 7 of 10 -------------------------------------------------------------------------------- Patient/Caregiver Education Details Patient Name: Date of Service: [name], [name]. 8/15/2024andnbsp2:45 PM Medical Record Number: [id number] Patient Account Number: [id number] Date of Birth/Gender: Treating RN: 1966/06/29 (57 y.o. Skip Mayer Primary Care Physician: Rennie Plowman Other Clinician: Betha Loa Referring Physician: Treating Physician/Extender: Orie Rout in Treatment: 3 Education Assessment Education Provided To: Patient Education Topics Provided Wound/Skin Impairment: Handouts: Other: continue wound care as  directed Methods: Explain/Verbal Responses: State content correctly Electronic Signature(s) Signed: 06/24/2023 5:16:10 PM By: Betha Loa Entered By: Betha Loa on 06/24/2023 15:46:52 -------------------------------------------------------------------------------- Wound Assessment Details Patient Name: Date of Service: [name]. 06/24/2023 2:45 PM Medical Record Number: [id number] Patient Account Number: [id number] Date of Birth/Sex: Treating RN: 08-Jul-1966 (57 y.o. [name], [name] Primary Care Dolly Harbach: Rennie Plowman Other Clinician: Betha Loa Referring Chimaobi Casebolt: Treating Theoden Mauch/Extender: Orie Rout in Treatment: 3 Wound Status Wound Number: 1R Primary Etiology: Dehisced Wound Wound Location: Proximal, Midline Abdomen - midline Wound Status: Open Wounding Event: Surgical Injury Comorbid History: Hypertension Date Acquired: 05/09/2022 Weeks Of Treatment: 3 Clustered Wound: No Wound Measurements Length: (cm) 0.1 Width: (cm) 0.1 Depth: (cm) 3 Area: (cm) 0.008 MARGHERITA, HOSP Stout ([id number]) Volume: (cm) 0.024 % Reduction in Area: 88.7% % Reduction in Volume: 42.9% Epithelialization: Large (67-100%) [id number] Page 8 of 10 Wound Description Classification: Full Thickness Without Exposed Support Exudate Amount: None Present Structures Foul Odor After Cleansing: No Slough/Fibrino No Wound Bed Granulation Amount: None Present (0%) Exposed Structure Necrotic Amount: None Present (0%) Fascia Exposed: No Fat Layer (Subcutaneous Tissue) Exposed: Yes Tendon Exposed: No Muscle Exposed: No Joint Exposed: No Bone Exposed: No Treatment Notes Wound #1R (Abdomen - midline) Wound Laterality: Midline, Proximal Cleanser Vashe 5.8 (oz) Discharge Instruction: Use vashe 5.8 (oz) as directed Peri-Wound Care Topical Mupirocin Ointment Discharge Instruction: Apply as directed by Aiesha Leland. Primary  Dressing Secondary Dressing Coverlet Latex-Free Fabric Adhesive Dressings Discharge Instruction: 1.5 x 2 Secured With Compression Wrap Compression Stockings Add-Ons Electronic Signature(s) Signed: 06/24/2023 5:16:10 PM By: Betha Loa Signed: 07/07/2023 5:37:35 PM By: Elliot Gurney, BSN, RN, CWS, Kim RN, BSN Entered By: Betha Loa on 06/24/2023 15:33:38 -------------------------------------------------------------------------------- Wound Assessment Details Patient Name: Date of Service: [name]. 06/24/2023 2:45 PM Medical Record Number: [id number] Patient Account Number: [id number] Date of Birth/Sex: Treating RN: November 17, 1965 (57 y.o. Skip Mayer Primary Care Brittian Renaldo: Rennie Plowman Other Clinician: Betha Loa Referring Cairo Lingenfelter: Treating Bralynn Velador/Extender: Orie Rout in Treatment: 3 Wound Status Wound Number: 2 Primary Etiology: Atypical Wound Location: Distal Abdomen - midline Wound Status: Open Wounding Event: Surgical Injury Comorbid History: Hypertension MERRIDITH, DOLLOFF ([id number]) [id number] Page 9 of 10 Date Acquired: 06/19/2023 Weeks Of Treatment: 0 Clustered Wound: No Photos Wound Measurements Length: (cm) 0.3 Width: (cm) 0.6 Depth: (cm) 0.7 Area: (cm) 0.141 Volume: (cm) 0.099 % Reduction in Area: % Reduction in Volume: Epithelialization: None Undermining: Yes Starting Position (o'clock): 12 Ending Position (o'clock): 12 Maximum Distance: (cm) 3.8 Wound Description Classification: Full Thickness Without Exposed Support Structures Exudate Amount: Medium Exudate Type: Serosanguineous Exudate Color: red, brown Foul Odor After Cleansing: No Slough/Fibrino Yes Wound Bed Exposed Structure Fascia Exposed: No Fat Layer (Subcutaneous Tissue) Exposed: Yes Tendon Exposed: No Muscle Exposed: No Joint Exposed: No Bone Exposed: No Treatment Notes Wound #2 (Abdomen - midline) Wound Laterality:  Distal Cleanser Vashe 5.8 (oz) Discharge Instruction: Use vashe 5.8 (oz) as directed Peri-Wound Care Topical Primary Dressing Hydrofera Blue Classic Foam  Rope Dressing, 9x6 (mm/in) Discharge Instruction: cut 1/2 and insert into tunnel Secondary Dressing (BORDER) Zetuvit Plus SILICONE BORDER Dressing 4x4 (in/in) Discharge Instruction: Please do not put silicone bordered dressings under wraps. Use non-bordered dressing only. Secured With Compression Wrap Compression Stockings Facilities manager) Signed: 06/24/2023 5:16:10 PM By: Betha Loa Signed: 07/07/2023 5:37:35 PM By: Elliot Gurney, BSN, RN, CWS, Kim RN, BSN Entered By: Betha Loa on 06/24/2023 15:26:12 [name] [name] ([id number]) [id number] Page 10 of 10 -------------------------------------------------------------------------------- Vitals Details Patient Name: Date of Service: NNENNA, [name]. 06/24/2023 2:45 PM Medical Record Number: [id number] Patient Account Number: [id number] Date of Birth/Sex: Treating RN: [birth year] (57 y.o. [name], [name] Primary Care Rina Adney: Rennie Plowman Other Clinician: Betha Loa Referring Onesty Clair: Treating Everlee Quakenbush/Extender: Orie Rout in Treatment: 3 Vital Signs Time Taken: 14:46 Temperature (F): 98.3 Height (in): 68 Pulse (bpm): 64 Weight (lbs): 175 Respiratory Rate (breaths/min): 16 Body Mass Index (BMI): 26.6 Blood Pressure (mmHg): 172/94 Reference Range: 80 - 120 mg / dl Electronic Signature(s) Signed: 06/24/2023 5:16:10 PM By: Betha Loa Entered By: Betha Loa on 06/24/2023 14:48:00

## 2023-07-20 ENCOUNTER — Institutional Professional Consult (permissible substitution): Payer: Managed Care, Other (non HMO) | Admitting: Plastic Surgery

## 2023-08-11 ENCOUNTER — Ambulatory Visit
Admission: RE | Admit: 2023-08-11 | Discharge: 2023-08-11 | Disposition: A | Discharge status: Home / Self Care | Payer: Managed Care, Other (non HMO) | Source: Ambulatory Visit | Attending: Internal Medicine | Admitting: Internal Medicine

## 2023-08-11 DIAGNOSIS — E042 Nontoxic multinodular goiter: Secondary | ICD-10-CM

## 2023-08-24 ENCOUNTER — Ambulatory Visit: Payer: Managed Care, Other (non HMO) | Admitting: Family

## 2023-08-25 ENCOUNTER — Ambulatory Visit: Payer: Managed Care, Other (non HMO) | Admitting: Nurse Practitioner

## 2023-09-29 ENCOUNTER — Observation Stay
Admission: EM | Admit: 2023-09-29 | Discharge: 2023-09-30 | Disposition: A | Discharge status: Home / Self Care | Payer: Managed Care, Other (non HMO) | Attending: Internal Medicine | Admitting: Internal Medicine

## 2023-09-29 ENCOUNTER — Encounter: Payer: Self-pay | Admitting: Emergency Medicine

## 2023-09-29 ENCOUNTER — Inpatient Hospital Stay: Payer: Managed Care, Other (non HMO)

## 2023-09-29 ENCOUNTER — Other Ambulatory Visit: Payer: Self-pay

## 2023-09-29 ENCOUNTER — Emergency Department: Payer: Managed Care, Other (non HMO)

## 2023-09-29 DIAGNOSIS — E042 Nontoxic multinodular goiter: Secondary | ICD-10-CM | POA: Diagnosis present

## 2023-09-29 DIAGNOSIS — I10 Essential (primary) hypertension: Secondary | ICD-10-CM | POA: Insufficient documentation

## 2023-09-29 DIAGNOSIS — I4891 Unspecified atrial fibrillation: Secondary | ICD-10-CM | POA: Diagnosis not present

## 2023-09-29 DIAGNOSIS — J45909 Unspecified asthma, uncomplicated: Secondary | ICD-10-CM | POA: Insufficient documentation

## 2023-09-29 DIAGNOSIS — Z9884 Bariatric surgery status: Secondary | ICD-10-CM | POA: Diagnosis not present

## 2023-09-29 DIAGNOSIS — Z85828 Personal history of other malignant neoplasm of skin: Secondary | ICD-10-CM | POA: Insufficient documentation

## 2023-09-29 DIAGNOSIS — E1169 Type 2 diabetes mellitus with other specified complication: Secondary | ICD-10-CM

## 2023-09-29 DIAGNOSIS — R55 Syncope and collapse: Secondary | ICD-10-CM | POA: Diagnosis not present

## 2023-09-29 DIAGNOSIS — E876 Hypokalemia: Secondary | ICD-10-CM | POA: Diagnosis not present

## 2023-09-29 DIAGNOSIS — R002 Palpitations: Secondary | ICD-10-CM | POA: Diagnosis present

## 2023-09-29 DIAGNOSIS — I480 Paroxysmal atrial fibrillation: Principal | ICD-10-CM | POA: Diagnosis present

## 2023-09-29 DIAGNOSIS — E049 Nontoxic goiter, unspecified: Secondary | ICD-10-CM

## 2023-09-29 DIAGNOSIS — S31109A Unspecified open wound of abdominal wall, unspecified quadrant without penetration into peritoneal cavity, initial encounter: Secondary | ICD-10-CM | POA: Insufficient documentation

## 2023-09-29 DIAGNOSIS — Z91040 Latex allergy status: Secondary | ICD-10-CM | POA: Diagnosis not present

## 2023-09-29 DIAGNOSIS — K743 Primary biliary cirrhosis: Secondary | ICD-10-CM | POA: Diagnosis not present

## 2023-09-29 DIAGNOSIS — E119 Type 2 diabetes mellitus without complications: Secondary | ICD-10-CM | POA: Insufficient documentation

## 2023-09-29 DIAGNOSIS — Z79899 Other long term (current) drug therapy: Secondary | ICD-10-CM | POA: Insufficient documentation

## 2023-09-29 DIAGNOSIS — I159 Secondary hypertension, unspecified: Secondary | ICD-10-CM

## 2023-09-29 HISTORY — DX: Unspecified atrial fibrillation: I48.91

## 2023-09-29 LAB — CBC WITH DIFFERENTIAL/PLATELET
Abs Immature Granulocytes: 0.01 10*3/uL (ref 0.00–0.07)
Basophils Absolute: 0 10*3/uL (ref 0.0–0.1)
Basophils Relative: 0 %
Eosinophils Absolute: 0.1 10*3/uL (ref 0.0–0.5)
Eosinophils Relative: 1 %
HCT: 38.6 % (ref 36.0–46.0)
Hemoglobin: 12.2 g/dL (ref 12.0–15.0)
Immature Granulocytes: 0 %
Lymphocytes Relative: 38 %
Lymphs Abs: 2.5 10*3/uL (ref 0.7–4.0)
MCH: 26.9 pg (ref 26.0–34.0)
MCHC: 31.6 g/dL (ref 30.0–36.0)
MCV: 85.2 fL (ref 80.0–100.0)
Monocytes Absolute: 0.5 10*3/uL (ref 0.1–1.0)
Monocytes Relative: 7 %
Neutro Abs: 3.6 10*3/uL (ref 1.7–7.7)
Neutrophils Relative %: 54 %
Platelets: 275 10*3/uL (ref 150–400)
RBC: 4.53 MIL/uL (ref 3.87–5.11)
RDW: 17 % — ABNORMAL HIGH (ref 11.5–15.5)
WBC: 6.7 10*3/uL (ref 4.0–10.5)
nRBC: 0 % (ref 0.0–0.2)

## 2023-09-29 LAB — COMPREHENSIVE METABOLIC PANEL
ALT: 28 U/L (ref 0–44)
AST: 33 U/L (ref 15–41)
Albumin: 3.8 g/dL (ref 3.5–5.0)
Alkaline Phosphatase: 154 U/L — ABNORMAL HIGH (ref 38–126)
Anion gap: 9 (ref 5–15)
BUN: 14 mg/dL (ref 6–20)
CO2: 22 mmol/L (ref 22–32)
Calcium: 8.9 mg/dL (ref 8.9–10.3)
Chloride: 111 mmol/L (ref 98–111)
Creatinine, Ser: 0.59 mg/dL (ref 0.44–1.00)
GFR, Estimated: >60 mL/min (ref >60)
Glucose, Bld: 86 mg/dL (ref 70–99)
Potassium: 2.9 mmol/L — ABNORMAL LOW (ref 3.5–5.1)
Sodium: 142 mmol/L (ref 135–145)
Total Bilirubin: 0.5 mg/dL (ref <1.2)
Total Protein: 7.9 g/dL (ref 6.5–8.1)

## 2023-09-29 LAB — TSH: TSH: 0.908 u[IU]/mL (ref 0.350–4.500)

## 2023-09-29 LAB — T4, FREE: Free T4: 1.08 ng/dL (ref 0.61–1.12)

## 2023-09-29 LAB — TROPONIN I (HIGH SENSITIVITY)
Troponin I (High Sensitivity): 11 ng/L (ref <18)
Troponin I (High Sensitivity): 11 ng/L (ref <18)

## 2023-09-29 LAB — POTASSIUM
Potassium: 3.1 mmol/L — ABNORMAL LOW (ref 3.5–5.1)
Potassium: 4.5 mmol/L (ref 3.5–5.1)

## 2023-09-29 LAB — D-DIMER, QUANTITATIVE: D-Dimer, Quant: 0.65 ug{FEU}/mL — ABNORMAL HIGH (ref 0.00–0.50)

## 2023-09-29 LAB — MAGNESIUM: Magnesium: 2.2 mg/dL (ref 1.7–2.4)

## 2023-09-29 MED ORDER — POTASSIUM CHLORIDE 10 MEQ/100ML IV SOLN
10.0000 meq | INTRAVENOUS | Status: AC
  Administered 2023-09-29: 10 meq via INTRAVENOUS
  Filled 2023-09-29: qty 100

## 2023-09-29 MED ORDER — URSODIOL 300 MG PO CAPS
300.0000 mg | ORAL_CAPSULE | Freq: Every day | ORAL | Status: DC
  Administered 2023-09-29: 300 mg via ORAL
  Filled 2023-09-29 (×2): qty 1

## 2023-09-29 MED ORDER — ONDANSETRON HCL 4 MG PO TABS: 4.0000 mg | ORAL_TABLET | Freq: Four times a day (QID) | ORAL | Status: DC | PRN

## 2023-09-29 MED ORDER — POTASSIUM CHLORIDE 10 MEQ/100ML IV SOLN
10.0000 meq | INTRAVENOUS | Status: AC
  Administered 2023-09-29 (×2): 10 meq via INTRAVENOUS
  Filled 2023-09-29 (×2): qty 100

## 2023-09-29 MED ORDER — TECHNETIUM TO 99M ALBUMIN AGGREGATED
4.0000 | Freq: Once | INTRAVENOUS | Status: AC | PRN
  Administered 2023-09-29: 4.38 via INTRAVENOUS

## 2023-09-29 MED ORDER — POTASSIUM CHLORIDE CRYS ER 20 MEQ PO TBCR: 40.0000 meq | EXTENDED_RELEASE_TABLET | Freq: Once | ORAL | Status: DC

## 2023-09-29 MED ORDER — VITAMIN E 45 MG (100 UNIT) PO CAPS
400.0000 [IU] | ORAL_CAPSULE | Freq: Every day | ORAL | Status: DC
  Administered 2023-09-29: 400 [IU] via ORAL
  Filled 2023-09-29 (×2): qty 4

## 2023-09-29 MED ORDER — VITAMIN D 25 MCG (1000 UNIT) PO TABS
1000.0000 [IU] | ORAL_TABLET | Freq: Every day | ORAL | Status: DC
  Administered 2023-09-29: 1000 [IU] via ORAL
  Filled 2023-09-29: qty 1

## 2023-09-29 MED ORDER — SERTRALINE HCL 50 MG PO TABS
25.0000 mg | ORAL_TABLET | Freq: Every day | ORAL | Status: DC | PRN
  Administered 2023-09-29: 25 mg via ORAL
  Filled 2023-09-29: qty 1

## 2023-09-29 MED ORDER — METOPROLOL SUCCINATE ER 25 MG PO TB24
12.5000 mg | ORAL_TABLET | Freq: Every day | ORAL | Status: DC
  Administered 2023-09-29: 12.5 mg via ORAL
  Filled 2023-09-29: qty 1

## 2023-09-29 MED ORDER — POTASSIUM CHLORIDE CRYS ER 20 MEQ PO TBCR
40.0000 meq | EXTENDED_RELEASE_TABLET | Freq: Once | ORAL | Status: AC
  Administered 2023-09-29: 40 meq via ORAL
  Filled 2023-09-29: qty 2

## 2023-09-29 MED ORDER — ENOXAPARIN SODIUM 80 MG/0.8ML IJ SOSY
1.0000 mg/kg | PREFILLED_SYRINGE | Freq: Once | INTRAMUSCULAR | Status: AC
  Administered 2023-09-29: 70 mg via SUBCUTANEOUS
  Filled 2023-09-29: qty 0.7

## 2023-09-29 MED ORDER — POTASSIUM CHLORIDE CRYS ER 20 MEQ PO TBCR
40.0000 meq | EXTENDED_RELEASE_TABLET | ORAL | Status: AC
  Administered 2023-09-29 (×2): 40 meq via ORAL
  Filled 2023-09-29 (×2): qty 2

## 2023-09-29 MED ORDER — ACETAMINOPHEN 325 MG PO TABS
650.0000 mg | ORAL_TABLET | Freq: Four times a day (QID) | ORAL | Status: DC | PRN
  Administered 2023-09-29: 650 mg via ORAL
  Filled 2023-09-29: qty 2

## 2023-09-29 MED ORDER — APIXABAN 5 MG PO TABS: 5.0000 mg | ORAL_TABLET | Freq: Two times a day (BID) | ORAL | Status: DC

## 2023-09-29 MED ORDER — SODIUM CHLORIDE 0.9 % IV SOLN
INTRAVENOUS | Status: DC
Start: 2023-09-29 — End: 2023-09-29

## 2023-09-29 MED ORDER — DILTIAZEM HCL 25 MG/5ML IV SOLN
10.0000 mg | Freq: Once | INTRAVENOUS | Status: AC
  Administered 2023-09-29: 10 mg via INTRAVENOUS
  Filled 2023-09-29: qty 5

## 2023-09-29 MED ORDER — VITAMIN A 3 MG (10000 UNIT) PO CAPS
10000.0000 [IU] | ORAL_CAPSULE | Freq: Every day | ORAL | Status: DC
  Administered 2023-09-29: 10000 [IU] via ORAL
  Filled 2023-09-29 (×2): qty 1

## 2023-09-29 MED ORDER — ONDANSETRON HCL 4 MG/2ML IJ SOLN: 4.0000 mg | Freq: Four times a day (QID) | INTRAMUSCULAR | Status: DC | PRN

## 2023-09-29 MED ORDER — AMLODIPINE BESYLATE 5 MG PO TABS
5.0000 mg | ORAL_TABLET | Freq: Every day | ORAL | Status: DC
  Administered 2023-09-29: 5 mg via ORAL
  Filled 2023-09-29: qty 1

## 2023-09-29 MED ORDER — VITAMIN K 100 MCG PO TABS
100.0000 ug | ORAL_TABLET | Freq: Every day | ORAL | Status: DC
  Filled 2023-09-29: qty 1

## 2023-09-29 NOTE — Assessment & Plan Note (Signed)
Last hemoglobin A1c in our computer was 5 on 04/07/23.

## 2023-09-29 NOTE — Progress Notes (Signed)
PHARMACIST - PHYSICIAN ORDER COMMUNICATION  CONCERNING: P&T Medication Policy on Herbal Medications  DESCRIPTION:  This patient's order for vitamin K 100 mcg PO has been noted.  This product(s) is classified as an "herbal" or natural product. Due to a lack of definitive safety studies or FDA approval, nonstandard manufacturing practices, plus the potential risk of unknown drug-drug interactions while on inpatient medications, the Pharmacy and Therapeutics Committee does not permit the use of "herbal" or natural products of this type within [facility].   ACTION TAKEN: The pharmacy department is unable to verify this order at this time and your patient has been informed of this safety policy. Please reevaluate patient's clinical condition at discharge and address if the herbal or natural product(s) should be resumed at that time.

## 2023-09-29 NOTE — Assessment & Plan Note (Addendum)
New onset atrial fibrillation w/ RVR Noted HR into 160s on presentation  S/p IV diltiazem w/ HR improving into 60s  Patient started on Eliquis and low-dose Toprol.  Patient is in sinus rhythm.  Echocardiogram showed a normal EF.  TSH normal range.  Cardiology cleared for discharge home.

## 2023-09-29 NOTE — ED Provider Notes (Signed)
[facility] Provider Note    Event Date/Time   First MD Initiated Contact with Patient 09/29/23 [phone]     (approximate)   History   Palpitations (Pt came in from work via EMS with complaints of dizziness and palpitations. On the cardiac strips from EMS, A-fib with RVR observed. )   HPI  [name] is a 57 y.o. female with history of asthma, previous history of hypertension and diabetes that resolved after bariatric surgery in 2017, iron deficiency anemia, PBC followed by [facility] who presents to the emergency department with palpitations that started while at work.  Palpitations are intermittent in nature.  Patient was found to be going intermittently into atrial fibrillation with a rate in the 160s per EMS.  Was not given any medications in route.  She states she feels that her chest is pounding when this happens and she feels short of breath and feels like she is going to pass out.  No recent vomiting or diarrhea.  She states she did drink an energy drink tonight which is not abnormal for her.  No illicit drug use, alcohol use or other stimulants.  Has never had a history of arrhythmia.  Does not follow-up with cardiology.  Denies history of PE or DVT.  No history of thyroid dysfunction.   History provided by patient, EMS.    Past Medical History:  Diagnosis Date   Asthma due to seasonal allergies    no inhaler   History of diabetes mellitus, type II    per pt resolved after bariatric surgery 2017   History of hypertension    per pt resolved after bariatric surgery 2017   History of kidney stones    History of obstructive sleep apnea    per pt hx osa w/ cpap,  retested after bariatric surgery 2017 result negative   History of skin cancer    nose, not melanoma   Hydronephrosis, right    Hyperlipidemia    MRSA carrier    NAFL (nonalcoholic fatty liver)    Osteoporosis    Primary biliary cirrhosis (HCC)    Primary biliary cirrhosis (HCC)     Renal calculi    per CT 04-18-2019 bilateral , non-obstructive   S/P bariatric surgery 08/03/2016   duodenal switch w/ hh repair   Ureteral calculus, right    Wears glasses     Past Surgical History:  Procedure Laterality Date   AUGMENTATION MAMMAPLASTY Bilateral 07/2021   Implants and lift   AUGMENTATION MAMMAPLASTY Bilateral 07/05/2022   Dr Kizzie Bane, replaced implants   BARIATRIC SURGERY  08-03-2016    dr Smitty Cords @[facility] ,  [place]   LAPAROSCOPY DUODENAL SWITCH AND HIATAL HERNIA REPAIR   BRACHIOPLASTY     BREAST BIOPSY Right 2017   FIBROCYSTIC CHANGES WITH CALCIFICATIONS   CESAREAN SECTION  x2  last one 09-10-2000   COLONOSCOPY WITH PROPOFOL N/A 09/11/2019   Procedure: COLONOSCOPY WITH PROPOFOL;  Surgeon: Wyline Mood, MD;  Location: [facility] ENDOSCOPY;  Service: Gastroenterology;  Laterality: N/A;   CYSTOSCOPY WITH RETROGRADE PYELOGRAM, URETEROSCOPY AND STENT PLACEMENT Right 02-07-2012  @[facility]    CYSTOSCOPY WITH STENT PLACEMENT Right 09/10/2017   Procedure: CYSTOSCOPY WITH STENT PLACEMENT;  Surgeon: Riki Altes, MD;  Location: [facility] ORS;  Service: Urology;  Laterality: Right;   CYSTOSCOPY/URETEROSCOPY/HOLMIUM LASER/STENT PLACEMENT Right 04/24/2019   Procedure: CYSTOSCOPY/URETEROSCOPY/STENT PLACEMENT/ RIGHT RETROGRADE;  Surgeon: Jerilee Field, MD;  Location: [facility];  Service: Urology;  Laterality: Right;   DX LAPAROSCOPY/  EGD/  HIATAL HERNIA REPAIR CLOSURE OF INTERNAL MENSENTRY DEFECT  10-28-2017   @[facility] ,  [place]   EXTRACORPOREAL SHOCK WAVE LITHOTRIPSY N/A 01/30/2019   Procedure: EXTRACORPOREAL SHOCK WAVE LITHOTRIPSY (ESWL) LEFT/ POSSIBLE RIGHT;  Surgeon: Jerilee Field, MD;  Location: [facility] ORS;  Service: Urology;  Laterality: N/A;   EXTRACORPOREAL SHOCK WAVE LITHOTRIPSY Right 03/20/2019   Procedure: EXTRACORPOREAL SHOCK WAVE LITHOTRIPSY (ESWL);  Surgeon: Marcine Matar, MD;  Location: [facility] ORS;  Service: Urology;  Laterality: Right;   EXTRACORPOREAL SHOCK WAVE  LITHOTRIPSY Left 10/30/2021   Procedure: EXTRACORPOREAL SHOCK WAVE LITHOTRIPSY (ESWL);  Surgeon: Crist Fat, MD;  Location: [facility];  Service: Urology;  Laterality: Left;   LAPAROSCOPIC CHOLECYSTECTOMY  10-15-2016   @[facility]  ,  [place]   LIPOSUCTION     PANNICULECTOMY     PLACEMENT OF BREAST IMPLANTS Bilateral    TONSILLECTOMY AND ADENOIDECTOMY  child   URETEROSCOPY WITH HOLMIUM LASER LITHOTRIPSY Right 09/10/2017   Procedure: URETEROSCOPY WITH HOLMIUM LASER LITHOTRIPSY;  Surgeon: Riki Altes, MD;  Location: [facility] ORS;  Service: Urology;  Laterality: Right;    MEDICATIONS:  Prior to Admission medications   Medication Sig Start Date End Date Taking? Authorizing Provider  calcium citrate (CALCITRATE - DOSED IN MG ELEMENTAL CALCIUM) 950 (200 Ca) MG tablet Take 200 mg of elemental calcium by mouth daily.    [provider]  CALCIUM PO Take by mouth.    [provider]  Cholecalciferol (VITAMIN D3 PO) Take by mouth daily.    [provider]  estradiol (ESTRACE) 0.1 MG/GM vaginal cream PLACE 1/4 APPLICATORFUL IN THE VAGINA AT BEDTIME 10/30/22   Linzie Collin, MD  Multiple Vitamins-Minerals (BARIATRIC MULTIVITAMINS/IRON PO) Take by mouth.    [provider]  mupirocin ointment (BACTROBAN) 2 % Apply 1 application topically 2 (two) times daily. X 5 days nose 06/03/21   McLean-Scocuzza, Pasty Spillers, MD  Pediatric Multivitamins-Iron (FRUITY CHEWS/IRON PO) Take by mouth.    [provider]  PREVIDENT 5000 BOOSTER PLUS 1.1 % PSTE Place onto teeth as directed. 08/23/20   [provider]  saw palmetto 80 MG capsule Take 80 mg by mouth 2 (two) times daily.    [provider]  sertraline (ZOLOFT) 25 MG tablet Take 25-50 mg by mouth daily as needed (itching).    [provider]  ursodiol (ACTIGALL) 250 MG tablet Take 250 mg by mouth in the morning, at noon, in the evening, and at bedtime. 03/10/23   [provider]  VITAMIN A PO Take by mouth daily.    [provider]  vitamin E 180 MG (400 UNITS) capsule Take 400 Units by mouth daily.    [provider]  vitamin k 100 MCG tablet Take 100 mcg by mouth daily.    [provider]    Physical Exam   Triage Vital Signs: ED Triage Vitals  Encounter Vitals Group     BP 09/29/23 0431 (!) 215/109     Systolic BP Percentile --      Diastolic BP Percentile --      Pulse Rate 09/29/23 0431 77     Resp 09/29/23 0431 19     Temp 09/29/23 0431 98.1 F (36.7 C)     Temp Source 09/29/23 0431 Oral     SpO2 09/29/23 0431 99 %     Weight 09/29/23 0440 155 lb (70.3 kg)     Height 09/29/23 0440 5\' 7"  (1.702 m)     Head Circumference --  Peak Flow --      Pain Score --      Pain Loc --      Pain Education --      Exclude from Growth Chart --     Most recent vital signs: Vitals:   09/29/23 0451 09/29/23 0500  BP: (!) 165/94 (!) 166/95  Pulse: 74 65  Resp: 18 13  Temp:    SpO2:  99%    CONSTITUTIONAL: Alert, responds appropriately to questions.  Appears uncomfortable when having episodes of A-fib HEAD: Normocephalic, atraumatic EYES: Conjunctivae clear, pupils appear equal, sclera nonicteric ENT: normal nose; moist mucous membranes NECK: Supple, normal ROM CARD: RRR and intermittently will go into atrial fibrillation with irregular rate that is tachycardic; S1 and S2 appreciated RESP: Normal chest excursion without splinting or tachypnea; breath sounds clear and equal bilaterally; no wheezes, no rhonchi, no rales, no hypoxia or respiratory distress, speaking full sentences ABD/GI: Non-distended; soft, non-tender, no rebound, no guarding, no peritoneal signs BACK: The back appears normal EXT: Normal ROM in all joints; no deformity noted, no edema, no calf tenderness or calf swelling SKIN: Normal color for age and race; warm; no rash on exposed skin NEURO: Moves all extremities equally, normal speech PSYCH:  The patient's mood and manner are appropriate.   ED Results / Procedures / Treatments   LABS: (all labs ordered are listed, but only abnormal results are displayed) Labs Reviewed  CBC WITH DIFFERENTIAL/PLATELET - Abnormal; Notable for the following components:      Result Value   RDW 17.0 (*)    All other components within normal limits  COMPREHENSIVE METABOLIC PANEL - Abnormal; Notable for the following components:   Potassium 2.9 (*)    Alkaline Phosphatase 154 (*)    All other components within normal limits  D-DIMER, QUANTITATIVE - Abnormal; Notable for the following components:   D-Dimer, Quant 0.65 (*)    All other components within normal limits  MAGNESIUM  TSH  T4, FREE  TROPONIN I (HIGH SENSITIVITY)  TROPONIN I (HIGH SENSITIVITY)     EKG:  EKG Interpretation Date/Time:  Wednesday September 29 2023 04:36:45 EST Ventricular Rate:  69 PR Interval:  164 QRS Duration:  106 QT Interval:  390 QTC Calculation: 418 R Axis:   5  Text Interpretation: Sinus rhythm Atrial premature complexes in couplets Left ventricular hypertrophy Confirmed by Rochele Raring [phone]) on 09/29/2023 4:41:38 AM         Date: 09/29/2023 4:40 AM  Rate: 107  Rhythm: Atrial fibrillation  QRS Axis: normal  Intervals: normal  ST/T Wave abnormalities: normal  Conduction Disutrbances: none  Narrative Interpretation: Atrial fibrillation     RADIOLOGY: My personal review and interpretation of imaging: X-ray clear.  I have personally reviewed all radiology reports.   DG Chest Portable 1 View  Result Date: 09/29/2023 CLINICAL DATA:  57 year old female with chest pain and shortness of breath. EXAM: PORTABLE CHEST 1 VIEW COMPARISON:  Chest radiographs 11/29/2018 and earlier. FINDINGS: Portable AP upright view at 0447 hours. Mediastinal contours are stable, mild chronic tortuosity of the thoracic aorta. Normal heart size. SIRT trachea normal lung volumes. Allowing for portable technique the  lungs are clear. No pneumothorax or pleural effusion. No acute osseous abnormality identified. Negative visible bowel gas. IMPRESSION: No acute cardiopulmonary abnormality. Electronically Signed   By: Odessa Fleming M.D.   On: 09/29/2023 05:14     PROCEDURES:  Critical Care performed: Yes, see critical care procedure note(s)   CRITICAL CARE Performed by:  Damica Gravlin   Total critical care time: 35 minutes  Critical care time was exclusive of separately billable procedures and treating other patients.  Critical care was necessary to treat or prevent imminent or life-threatening deterioration.  Critical care was time spent personally by me on the following activities: development of treatment plan with patient and/or surrogate as well as nursing, discussions with consultants, evaluation of patient's response to treatment, examination of patient, obtaining history from patient or surrogate, ordering and performing treatments and interventions, ordering and review of laboratory studies, ordering and review of radiographic studies, pulse oximetry and re-evaluation of patient's condition.   Marland Kitchen1-3 Lead EKG Interpretation  Performed by: Lytle Malburg, Layla Maw, DO Authorized by: Galvin Aversa, Layla Maw, DO     Interpretation: abnormal     ECG rate:  130   ECG rate assessment: tachycardic     Rhythm: atrial fibrillation     Ectopy: PVCs     Conduction: normal       IMPRESSION / MDM / ASSESSMENT AND PLAN / ED COURSE  I reviewed the triage vital signs and the nursing notes.    Patient having intermittent episodes of atrial fibrillation.  Very symptomatic with these episodes.  The patient is on the cardiac monitor to evaluate for evidence of arrhythmia and/or significant heart rate changes.   DIFFERENTIAL DIAGNOSIS (includes but not limited to):   A-fib, electrolyte derangement, anemia, ACS, PE thyroid dysfunction   Patient's presentation is most consistent with acute presentation with potential threat  to life or bodily function.   PLAN: Will obtain labs, chest x-ray.  Will give IV diltiazem and continue to closely monitor.   MEDICATIONS GIVEN IN ED: Medications  potassium chloride 10 mEq in 100 mL IVPB (has no administration in time range)  potassium chloride SA (KLOR-CON M) CR tablet 40 mEq (has no administration in time range)  enoxaparin (LOVENOX) injection 70 mg (has no administration in time range)  diltiazem (CARDIZEM) injection 10 mg (10 mg Intravenous Given 09/29/23 0448)     ED COURSE: Patient's labs show normal hemoglobin.  Potassium of 2.9.  Will give IV and oral replacement.  Normal magnesium.  Normal TSH and free T4.  First troponin negative.  D-dimer is elevated.  Patient has history of severe allergy with contrast.  Will obtain VQ scan.  I recommended admission for potassium replacement and monitoring of her heart rate, repeat troponin, VQ scan.  Patient agrees.  Will discuss with hospitalist.   CONSULTS:  Consulted and discussed patient's case with hospitalist, Dr. Arville Care.  I have recommended admission and consulting physician agrees and will place admission orders.  Patient (and family if present) agree with this plan.   I reviewed all nursing notes, vitals, pertinent previous records.  All labs, EKGs, imaging ordered have been independently reviewed and interpreted by myself.    OUTSIDE RECORDS REVIEWED: Reviewed last [facility] note for primary biliary cholangitis on 04/21/2023.       FINAL CLINICAL IMPRESSION(S) / ED DIAGNOSES   Final diagnoses:  Paroxysmal atrial fibrillation with RVR (HCC)  Hypokalemia     Rx / DC Orders   ED Discharge Orders     None        Note:  This document was prepared using Dragon voice recognition software and may include unintentional dictation errors.   Shalea Tomczak, Layla Maw, DO 09/29/23 [phone]

## 2023-09-29 NOTE — Consult Note (Deleted)
PHARMACY CONSULT NOTE - ELECTROLYTES  Pharmacy Consult for Electrolyte Monitoring and Replacement   Recent Labs: Height: 5\' 7"  (170.2 cm) Weight: 70.3 kg (155 lb) IBW/kg (Calculated) : 61.6 Estimated Creatinine Clearance: 75.4 mL/min (by C-G formula based on SCr of 0.59 mg/dL).  Potassium (mmol/L)  Date Value  09/29/2023 2.9 (L)  03/08/2012 4.1   Magnesium (mg/dL)  Date Value  40/98/1191 2.2   Calcium (mg/dL)  Date Value  47/82/9562 8.9   Calcium, Total (mg/dL)  Date Value  13/06/6577 9.2   Albumin (g/dL)  Date Value  46/96/2952 3.8  04/07/2023 3.8   Phosphorus (mg/dL)  Date Value  84/13/2440 3.9   Sodium (mmol/L)  Date Value  09/29/2023 142  04/07/2023 142  03/08/2012 142   Assessment  [name] is a 57 y.o. female presenting with heart palpitations and found to be in atrial fibrillation with RVR. PMH significant for  type 2 diabetes, primary bili cirrhosis, goiter, status post bariatric surgery with fat-soluble vitamin deficiency, and hypertension. Pharmacy has been consulted to monitor and replace electrolytes.  Diet: PO  MIVF: NS @ 75 mL/hr  Goal of Therapy: Electrolytes WNL  Plan:  K+ 2.9; will order KCl 40 mEq PO + 20 mEq IV   Check BMP, Mg, Phos with AM labs  Thank you for allowing pharmacy to be a part of this patient's care.  Littie Deeds, PharmD Pharmacy Resident  09/29/2023 12:35 PM

## 2023-09-29 NOTE — ED Notes (Signed)
Ambulatory to restroom

## 2023-09-29 NOTE — ED Notes (Signed)
Pt up to bathroom at this time. Able to ambulate independently.

## 2023-09-29 NOTE — Assessment & Plan Note (Signed)
Low-dose Toprol and Norvasc prescribed

## 2023-09-29 NOTE — Consult Note (Signed)
Cardiology Consultation   Patient ID: [name] MRN: [id number]; DOB: [birth year]  Admit date: 09/29/2023 Date of Consult: 09/29/2023  PCP:  Allegra Grana, FNP   [facility] Providers Cardiologist:  None      New consult completed by Dr Mariah Milling  Patient Profile:   [name] is a 57 y.o. female with a hx of type 2 diabetes, primary biliary cirrhosis, GERD, status post bariatric surgery with fat-soluble vitamin deficiency, hypertension, who is being seen 09/29/2023 for the evaluation of new onset atrial fibrillation RVR at the request of Dr. Alvester Morin.  History of Present Illness:   [name] was previously mentions significant past medical history.  Who presented to the [facility] emergency department via EMS from work with complaints of dizziness and palpitations.  On rhythm strip from EMS she was noted to be in atrial fibrillation with RVR.  She states she had palpitations that started while at work with occasional episodes in the past.  She was found to be: Intermittently and out of atrial fibrillation with rates as high as 160s per EMS.  She was not given any medications en route to the emergency department.  She states that she feels like her chest is pounding when it happens she feels short of breath and that she may pass out. Felt as if she was having tunnel vision and had to lay her head down at her workstation.  States that prior to the episode starting she did have an energy drink which is not abnormal for her but denied any energy shots.  She denies any illicit drug use, alcohol use or other stimulants.  She does not have a history of arrhythmia.  She has never seen cardiology in the past and has no significant medical history of cardiac problems.  Initial vital signs: Blood pressure 215/109, pulse of 77, respirations 19, temperature 98.1  Pertinent labs: Potassium 2.9, alkaline phosphatase 154, D-dimer 0.65, TSH 0.908, free T41.08, high-sensitivity  troponin 11 x 2  Imaging: Chest x-ray with no acute cardiopulmonary abnormality; VQ lung scan showed normal perfusion exam with no evidence of acute pulmonary embolism  Medications administered in the emergency department potassium chloride 10 mill equivalents IVPB, potassium chloride 40 mEq oral, Lovenox 70 mg subcu, diltiazem 10 mg IV  Cardiology consult for new onset atrial fibrillation with RVR   Past Medical History:  Diagnosis Date   Asthma due to seasonal allergies    no inhaler   History of diabetes mellitus, type II    per pt resolved after bariatric surgery 2017   History of hypertension    per pt resolved after bariatric surgery 2017   History of kidney stones    History of obstructive sleep apnea    per pt hx osa w/ cpap,  retested after bariatric surgery 2017 result negative   History of skin cancer    nose, not melanoma   Hydronephrosis, right    Hyperlipidemia    MRSA carrier    NAFL (nonalcoholic fatty liver)    Osteoporosis    Primary biliary cirrhosis (HCC)    Primary biliary cirrhosis (HCC)    Renal calculi    per CT 04-18-2019 bilateral , non-obstructive   S/P bariatric surgery 08/03/2016   duodenal switch w/ hh repair   Ureteral calculus, right    Wears glasses     Past Surgical History:  Procedure Laterality Date   AUGMENTATION MAMMAPLASTY Bilateral 07/2021   Implants and lift   AUGMENTATION MAMMAPLASTY Bilateral  07/05/2022   Dr Kizzie Bane, replaced implants   BARIATRIC SURGERY  08-03-2016    dr Smitty Cords @[facility] ,  [facility]   LAPAROSCOPY DUODENAL SWITCH AND HIATAL HERNIA REPAIR   BRACHIOPLASTY     BREAST BIOPSY Right 2017   FIBROCYSTIC CHANGES WITH CALCIFICATIONS   CESAREAN SECTION  x2  last one 09-10-2000   COLONOSCOPY WITH PROPOFOL N/A 09/11/2019   Procedure: COLONOSCOPY WITH PROPOFOL;  Surgeon: Wyline Mood, MD;  Location: [facility] ENDOSCOPY;  Service: Gastroenterology;  Laterality: N/A;   CYSTOSCOPY WITH RETROGRADE PYELOGRAM, URETEROSCOPY AND STENT  PLACEMENT Right 02-07-2012  @[facility]    CYSTOSCOPY WITH STENT PLACEMENT Right 09/10/2017   Procedure: CYSTOSCOPY WITH STENT PLACEMENT;  Surgeon: Riki Altes, MD;  Location: [facility] ORS;  Service: Urology;  Laterality: Right;   CYSTOSCOPY/URETEROSCOPY/HOLMIUM LASER/STENT PLACEMENT Right 04/24/2019   Procedure: CYSTOSCOPY/URETEROSCOPY/STENT PLACEMENT/ RIGHT RETROGRADE;  Surgeon: Jerilee Field, MD;  Location: [facility];  Service: Urology;  Laterality: Right;   DX LAPAROSCOPY/  EGD/  HIATAL HERNIA REPAIR CLOSURE OF INTERNAL MENSENTRY DEFECT  10-28-2017   @[facility] ,  [place]   EXTRACORPOREAL SHOCK WAVE LITHOTRIPSY N/A 01/30/2019   Procedure: EXTRACORPOREAL SHOCK WAVE LITHOTRIPSY (ESWL) LEFT/ POSSIBLE RIGHT;  Surgeon: Jerilee Field, MD;  Location: [facility] ORS;  Service: Urology;  Laterality: N/A;   EXTRACORPOREAL SHOCK WAVE LITHOTRIPSY Right 03/20/2019   Procedure: EXTRACORPOREAL SHOCK WAVE LITHOTRIPSY (ESWL);  Surgeon: Marcine Matar, MD;  Location: [facility] ORS;  Service: Urology;  Laterality: Right;   EXTRACORPOREAL SHOCK WAVE LITHOTRIPSY Left 10/30/2021   Procedure: EXTRACORPOREAL SHOCK WAVE LITHOTRIPSY (ESWL);  Surgeon: Crist Fat, MD;  Location: [facility];  Service: Urology;  Laterality: Left;   LAPAROSCOPIC CHOLECYSTECTOMY  10-15-2016   @[facility]  ,  [place]   LIPOSUCTION     PANNICULECTOMY     PLACEMENT OF BREAST IMPLANTS Bilateral    TONSILLECTOMY AND ADENOIDECTOMY  child   URETEROSCOPY WITH HOLMIUM LASER LITHOTRIPSY Right 09/10/2017   Procedure: URETEROSCOPY WITH HOLMIUM LASER LITHOTRIPSY;  Surgeon: Riki Altes, MD;  Location: [facility] ORS;  Service: Urology;  Laterality: Right;     Home Medications:  Prior to Admission medications   Medication Sig Start Date End Date Taking? Authorizing Provider  calcium citrate (CALCITRATE - DOSED IN MG ELEMENTAL CALCIUM) 950 (200 Ca) MG tablet Take 200 mg of elemental calcium by mouth daily.   Yes [provider]  CALCIUM PO Take by mouth.   Yes [provider]  Cholecalciferol (VITAMIN D3 PO) Take by mouth daily.   Yes [provider]  Multiple Vitamins-Minerals (BARIATRIC MULTIVITAMINS/IRON PO) Take by mouth.   Yes [provider]  Pediatric Multivitamins-Iron (FRUITY CHEWS/IRON PO) Take by mouth.   Yes [provider]  saw palmetto 80 MG capsule Take 80 mg by mouth 2 (two) times daily.   Yes [provider]  sertraline (ZOLOFT) 25 MG tablet Take 25-50 mg by mouth daily as needed (itching).   Yes [provider]  ursodiol (ACTIGALL) 250 MG tablet Take 250 mg by mouth in the morning, at noon, in the evening, and at bedtime. 03/10/23  Yes [provider]  VITAMIN A PO Take by mouth daily.   Yes [provider]  vitamin E 180 MG (400 UNITS) capsule Take 400 Units by mouth daily.   Yes [provider]  vitamin k 100 MCG tablet Take 100 mcg by mouth daily.   Yes [provider]  estradiol (ESTRACE) 0.1 MG/GM vaginal cream PLACE 1/4 APPLICATORFUL IN THE VAGINA AT BEDTIME  Patient not taking: Reported on 09/29/2023 10/30/22   Linzie Collin, MD  mupirocin ointment (BACTROBAN) 2 % Apply 1 application topically 2 (two) times daily. X 5 days nose Patient not taking: Reported on 09/29/2023 06/03/21   McLean-Scocuzza, Pasty Spillers, MD  PREVIDENT 5000 BOOSTER PLUS 1.1 % PSTE Place onto teeth as directed. 08/23/20   [provider]    Inpatient Medications: Scheduled Meds:  ursodiol  300 mg Oral Daily   Continuous Infusions:  sodium chloride 75 mL/hr at 09/29/23 1027   PRN Meds: ondansetron **OR** ondansetron (ZOFRAN) IV, sertraline  Allergies:    Allergies  Allergen Reactions   Ivp Dye [Iodinated Contrast Media]     Patient 'codes' with IV contrast   Latex Shortness Of Breath and Cough   Tolmetin Hives   Gallium Nitrate Hives    AND Gadolinium Derivatives    Insulin Detemir Hives and Swelling    Lump  at injection site   Nsaids Hives    Per pt stated has to take ibuprofen with a antihistimine   Gadolinium Derivatives Hives    Pt c/o itching after contrast injection 03/15/19 @ 8:45am. [facility] Per Dr. Fredia Sorrow document this as allergic reaction.     Social History:   Social History   Socioeconomic History   Marital status: Single    Spouse name: Not on file   Number of children: 2   Years of education: 14   Highest education level: Not on file  Occupational History   Occupation: Advice worker: [employer]   Occupation: Teaches for [employer]     Comment: Through [employer]   Occupation: Teaches microbiology   Occupation: Technical brewer    Comment: Microbiology - Weekend  Tobacco Use   Smoking status: Never   Smokeless tobacco: Never  Vaping Use   Vaping status: Never Used  Substance and Sexual Activity   Alcohol use: No    Alcohol/week: 0.0 standard drinks of alcohol   Drug use: Never   Sexual activity: Yes    Birth control/protection: None, Post-menopausal  Other Topics Concern   Not on file  Social History Narrative   Marvia Pickles grew up partly in [place] and then [place]. She lives in [place] with her two daughter. 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 She enjoys the outdoors, gardening.    Right handed   Two story home   Takes caffeine      Microbiologist      Social Determinants of Health   Financial Resource Strain: Not on file  Food Insecurity: No Food Insecurity (09/29/2023)   Hunger Vital Sign    Worried About Running Out of Food in the Last Year: Never true    Ran Out of Food in the Last Year: Never true  Transportation Needs: No Transportation Needs (09/29/2023)   PRAPARE - Administrator, Civil Service (Medical): No    Lack of Transportation (Non-Medical): No  Physical Activity: Not on file  Stress: Not on file  Social Connections: Not on file  Intimate Partner Violence: Not At Risk (09/29/2023)    Humiliation, Afraid, Rape, and Kick questionnaire    Fear of Current or Ex-Partner: No    Emotionally Abused: No    Physically Abused: No    Sexually Abused: No    Family History:    Family History  Problem Relation Age of Onset   Seizures Mother    Alcohol abuse Mother    Hyperlipidemia Mother  Hypertension Mother    Lymphoma Mother        primary site : brain   Leukemia Mother    Parkinsonism Father    Hyperlipidemia Father    Hypertension Father    Parkinson's disease Father    Drug abuse Sister    Cancer Maternal Grandmother        breast cancer   Breast cancer Maternal Grandmother 32   Liver cancer Paternal Grandfather    Kidney cancer Neg Hx    Prostate cancer Neg Hx      ROS:  Please see the history of present illness.  Review of Systems  Respiratory:  Positive for shortness of breath.   Cardiovascular:  Positive for chest pain and palpitations.  Neurological:  Positive for dizziness.    All other ROS reviewed and negative.     Physical Exam/Data:   Vitals:   09/29/23 1030 09/29/23 1100 09/29/23 1130 09/29/23 1200  BP: (!) 179/82 (!) 150/89 (!) 152/89 (!) 176/101  Pulse: 69 (!) 58 (!) 58 76  Resp: 15 15 16  (!) 21  Temp:      TempSrc:      SpO2: 99% 98% 98% 97%  Weight:      Height:        Intake/Output Summary (Last 24 hours) at 09/29/2023 1323 Last data filed at 09/29/2023 0630 Gross per 24 hour  Intake 10.01 ml  Output --  Net 10.01 ml      09/29/2023    4:40 AM 06/23/2023    9:13 AM 06/03/2023    9:46 AM  Last 3 Weights  Weight (lbs) 155 lb 168 lb 3.2 oz 171 lb  Weight (kg) 70.308 kg 76.295 kg 77.565 kg     Body mass index is 24.28 kg/m.  General:  Well nourished, well developed, in no acute distress HEENT: normal Neck: no JVD Vascular: No carotid bruits; Distal pulses 2+ bilaterally Cardiac:  normal S1, S2; IR IR; no murmur  Lungs:  clear to auscultation bilaterally, no wheezing, rhonchi or rales  Abd: soft, nontender, no  hepatomegaly  Ext: no edema Musculoskeletal:  No deformities, BUE and BLE strength normal and equal Skin: warm and dry  Neuro:  CNs 2-12 intact, no focal abnormalities noted Psych:  Normal affect   EKG:  The EKG was personally reviewed and demonstrates: Sinus rhythm with a rate of 69 with PACs and LVH Telemetry:  Telemetry was personally reviewed and demonstrates: Sinus bradycardia to sinus with rates of 50-60  Relevant CV Studies: Echocardiogram ordered and pending  Laboratory Data:  High Sensitivity Troponin:   Recent Labs  Lab 09/29/23 0442 09/29/23 0605  TROPONINIHS 11 11     Chemistry Recent Labs  Lab 09/29/23 0442  NA 142  K 2.9*  CL 111  CO2 22  GLUCOSE 86  BUN 14  CREATININE 0.59  CALCIUM 8.9  MG 2.2  GFRNONAA >60  ANIONGAP 9    Recent Labs  Lab 09/29/23 0442  PROT 7.9  ALBUMIN 3.8  AST 33  ALT 28  ALKPHOS 154*  BILITOT 0.5   Lipids No results for input(s): "CHOL", "TRIG", "HDL", "LABVLDL", "LDLCALC", "CHOLHDL" in the last 168 hours.  Hematology Recent Labs  Lab 09/29/23 0442  WBC 6.7  RBC 4.53  HGB 12.2  HCT 38.6  MCV 85.2  MCH 26.9  MCHC 31.6  RDW 17.0*  PLT 275   Thyroid  Recent Labs  Lab 09/29/23 0442  TSH 0.908  FREET4 1.08  BNPNo results for input(s): "BNP", "PROBNP" in the last 168 hours.  DDimer  Recent Labs  Lab 09/29/23 0442  DDIMER 0.65*     Radiology/Studies:  NM Pulmonary Perfusion  Result Date: 09/29/2023 CLINICAL DATA:  Positive D-dimer.  Concern for pulmonary embolism. EXAM: NUCLEAR MEDICINE PERFUSION LUNG SCAN TECHNIQUE: Perfusion images were obtained in multiple projections after intravenous injection of radiopharmaceutical. RADIOPHARMACEUTICALS:  4.38 mCi Tc-38m MAA COMPARISON:  Chest radiograph 09/29/2023 FINDINGS: No wedge-shaped peripheral perfusion defect within LEFT or RIGHT lung to suggest acute pulmonary embolism. Normal perfusion pattern. IMPRESSION: Normal perfusion exam.  No evidence acute  pulmonary embolism. Electronically Signed   By: Genevive Bi M.D.   On: 09/29/2023 12:44   DG Chest Portable 1 View  Result Date: 09/29/2023 CLINICAL DATA:  57 year old female with chest pain and shortness of breath. EXAM: PORTABLE CHEST 1 VIEW COMPARISON:  Chest radiographs 11/29/2018 and earlier. FINDINGS: Portable AP upright view at 0447 hours. Mediastinal contours are stable, mild chronic tortuosity of the thoracic aorta. Normal heart size. SIRT trachea normal lung volumes. Allowing for portable technique the lungs are clear. No pneumothorax or pleural effusion. No acute osseous abnormality identified. Negative visible bowel gas. IMPRESSION: No acute cardiopulmonary abnormality. Electronically Signed   By: Odessa Fleming M.D.   On: 09/29/2023 05:14     Assessment and Plan:   New onset atrial fibrillation with RVR -Presented from work with dizziness and palpitations -EMS noted atrial fibrillation RVR -Patient positive for energy drinks and high caffeine intake -TSH and free T4 were normal -Given diltiazem 10 mg IVP -Converted to sinus rhythm with IV diltiazem -Echocardiogram ordered and pending with further recommendations to follow -CHA2DS2-VASc score of 3, will start on apixaban 5 mg twice daily for stroke prophylaxis -Positive D-dimer with VQ scan with normal with low probability of PE -High-sensitivity troponin negative x 2 -Started on Toprol-XL 12.5 mg daily to baseline bradycardia  Hypokalemia -Serum potassium 2.9 -Potassium supplementation ordered -Mag level -Monitor/trend/replete electrolytes as needed -Recommend maintaining potassium level closer to 4 less than 5 and magnesium level 2 -Repeat potassium ordered if she was intolerant to previous IV potassium supplements -Daily BMP  Hypertension -Blood pressure 184/104 -No longer required antihypertensive medications status post gastric bypass surgery -Started amlodipine 5 mg daily -IV hydration discontinued -Vital signs  per unit protocol  Type 2 diabetes -Blood sugars have been stable -Continued management per IM    Risk Assessment/Risk Scores:          CHA2DS2-VASc Score = 3   This indicates a 3.2% annual risk of stroke. The patient's score is based upon: CHF History: 0 HTN History: 1 Diabetes History: 1 Stroke History: 0 Vascular Disease History: 0 Age Score: 0 Gender Score: 1         For questions or updates, please contact [facility] Please consult [email or web address] for contact info under    Signed, Vlad Mayberry, NP  09/29/2023 1:23 PM

## 2023-09-29 NOTE — Assessment & Plan Note (Signed)
On multiple fat soluble supplements

## 2023-09-29 NOTE — Assessment & Plan Note (Signed)
05/2020 [facility] [address] Guido Sander: Patient has AMA positive PBC, biopsy proven.  On ursodiol

## 2023-09-29 NOTE — H&P (Addendum)
History and Physical    Patient: [name] ZOX:[id number] DOB: [birth year] DOA: 09/29/2023 DOS: the patient was seen and examined on 09/29/2023 PCP: Allegra Grana, FNP  Patient coming from: Home  Chief Complaint:  Chief Complaint  Patient presents with   Palpitations    Pt came in from work via EMS with complaints of dizziness and palpitations. On the cardiac strips from EMS, A-fib with RVR observed.    HPI: [name] is a 57 y.o. female with medical history significant of type 2 diabetes, primary bili cirrhosis, goiter, status post bariatric surgery with fat-soluble vitamin deficiency, hypertension, presenting with A-fib with RVR.  Pt reports palpitations since 2am.  Patient works night at [employer].  Denies any prior episodes like this in the past has had some mild shortness of breath chronically.+ High caffeine intake.  Mild chest pressure.  No focal hemiparesis or confusion.  Minimal nausea.  No illicit drug use.  Patient denies any alcohol or tobacco use.  Symptoms persisted over the course of the night.  Patient also reports mild abdominal discomfort from staple removal in the abdomen. Presented to the ER afebrile, initial heart rate into the 160s started on dill drip.  Heart rate now in the 70s to 60s.  BP stable.  Satting well on room air.  White count 6.7, hemoglobin 12.2, platelets 275, troponin negative x 2.  Creatinine 0.6, potassium 2.9.  TSH and T4 within normal limits.  Follow-up EKG normal sinus rhythm. Review of Systems: As mentioned in the history of present illness. All other systems reviewed and are negative. Past Medical History:  Diagnosis Date   Asthma due to seasonal allergies    no inhaler   History of diabetes mellitus, type II    per pt resolved after bariatric surgery 2017   History of hypertension    per pt resolved after bariatric surgery 2017   History of kidney stones    History of obstructive sleep apnea    per pt hx osa w/ cpap,   retested after bariatric surgery 2017 result negative   History of skin cancer    nose, not melanoma   Hydronephrosis, right    Hyperlipidemia    MRSA carrier    NAFL (nonalcoholic fatty liver)    Osteoporosis    Primary biliary cirrhosis (HCC)    Primary biliary cirrhosis (HCC)    Renal calculi    per CT 04-18-2019 bilateral , non-obstructive   S/P bariatric surgery 08/03/2016   duodenal switch w/ hh repair   Ureteral calculus, right    Wears glasses    Past Surgical History:  Procedure Laterality Date   AUGMENTATION MAMMAPLASTY Bilateral 07/2021   Implants and lift   AUGMENTATION MAMMAPLASTY Bilateral 07/05/2022   Dr Kizzie Bane, replaced implants   BARIATRIC SURGERY  08-03-2016    dr Smitty Cords @[facility] ,  [place]   LAPAROSCOPY DUODENAL SWITCH AND HIATAL HERNIA REPAIR   BRACHIOPLASTY     BREAST BIOPSY Right 2017   FIBROCYSTIC CHANGES WITH CALCIFICATIONS   CESAREAN SECTION  x2  last one 09-10-2000   COLONOSCOPY WITH PROPOFOL N/A 09/11/2019   Procedure: COLONOSCOPY WITH PROPOFOL;  Surgeon: Wyline Mood, MD;  Location: [facility] ENDOSCOPY;  Service: Gastroenterology;  Laterality: N/A;   CYSTOSCOPY WITH RETROGRADE PYELOGRAM, URETEROSCOPY AND STENT PLACEMENT Right 02-07-2012  @[facility]    CYSTOSCOPY WITH STENT PLACEMENT Right 09/10/2017   Procedure: CYSTOSCOPY WITH STENT PLACEMENT;  Surgeon: Riki Altes, MD;  Location: [facility] ORS;  Service: Urology;  Laterality: Right;  CYSTOSCOPY/URETEROSCOPY/HOLMIUM LASER/STENT PLACEMENT Right 04/24/2019   Procedure: CYSTOSCOPY/URETEROSCOPY/STENT PLACEMENT/ RIGHT RETROGRADE;  Surgeon: Jerilee Field, MD;  Location: [facility];  Service: Urology;  Laterality: Right;   DX LAPAROSCOPY/  EGD/  HIATAL HERNIA REPAIR CLOSURE OF INTERNAL MENSENTRY DEFECT  10-28-2017   @[facility] ,  [place]   EXTRACORPOREAL SHOCK WAVE LITHOTRIPSY N/A 01/30/2019   Procedure: EXTRACORPOREAL SHOCK WAVE LITHOTRIPSY (ESWL) LEFT/ POSSIBLE RIGHT;  Surgeon: Jerilee Field, MD;   Location: [facility] ORS;  Service: Urology;  Laterality: N/A;   EXTRACORPOREAL SHOCK WAVE LITHOTRIPSY Right 03/20/2019   Procedure: EXTRACORPOREAL SHOCK WAVE LITHOTRIPSY (ESWL);  Surgeon: Marcine Matar, MD;  Location: [facility] ORS;  Service: Urology;  Laterality: Right;   EXTRACORPOREAL SHOCK WAVE LITHOTRIPSY Left 10/30/2021   Procedure: EXTRACORPOREAL SHOCK WAVE LITHOTRIPSY (ESWL);  Surgeon: Crist Fat, MD;  Location: [facility];  Service: Urology;  Laterality: Left;   LAPAROSCOPIC CHOLECYSTECTOMY  10-15-2016   @[facility]  ,  [place]   LIPOSUCTION     PANNICULECTOMY     PLACEMENT OF BREAST IMPLANTS Bilateral    TONSILLECTOMY AND ADENOIDECTOMY  child   URETEROSCOPY WITH HOLMIUM LASER LITHOTRIPSY Right 09/10/2017   Procedure: URETEROSCOPY WITH HOLMIUM LASER LITHOTRIPSY;  Surgeon: Riki Altes, MD;  Location: [facility] ORS;  Service: Urology;  Laterality: Right;   Social History:  reports that she has never smoked. She has never used smokeless tobacco. She reports that she does not drink alcohol and does not use drugs.  Allergies  Allergen Reactions   Ivp Dye [Iodinated Contrast Media]     Patient 'codes' with IV contrast   Latex Shortness Of Breath and Cough   Tolmetin Hives   Gallium Nitrate Hives    AND Gadolinium Derivatives    Insulin Detemir Hives and Swelling    Lump at injection site   Nsaids Hives    Per pt stated has to take ibuprofen with a antihistimine   Gadolinium Derivatives Hives    Pt c/o itching after contrast injection 03/15/19 @ 8:45am. [facility] Per Dr. Fredia Sorrow document this as allergic reaction.     Family History  Problem Relation Age of Onset   Seizures Mother    Alcohol abuse Mother    Hyperlipidemia Mother    Hypertension Mother    Lymphoma Mother        primary site : brain   Leukemia Mother    Parkinsonism Father    Hyperlipidemia Father    Hypertension Father    Parkinson's disease Father    Drug abuse Sister    Cancer Maternal Grandmother         breast cancer   Breast cancer Maternal Grandmother 27   Liver cancer Paternal Grandfather    Kidney cancer Neg Hx    Prostate cancer Neg Hx     Prior to Admission medications   Medication Sig Start Date End Date Taking? Authorizing Provider  calcium citrate (CALCITRATE - DOSED IN MG ELEMENTAL CALCIUM) 950 (200 Ca) MG tablet Take 200 mg of elemental calcium by mouth daily.   Yes [provider]  CALCIUM PO Take by mouth.   Yes [provider]  Cholecalciferol (VITAMIN D3 PO) Take by mouth daily.   Yes [provider]  Multiple Vitamins-Minerals (BARIATRIC MULTIVITAMINS/IRON PO) Take by mouth.   Yes [provider]  Pediatric Multivitamins-Iron (FRUITY CHEWS/IRON PO) Take by mouth.   Yes [provider]  saw palmetto 80 MG capsule Take 80 mg by mouth 2 (two) times daily.   Yes [provider]  sertraline (ZOLOFT) 25 MG tablet Take 25-50 mg by mouth daily as needed (itching).   Yes [provider]  ursodiol (ACTIGALL) 250 MG tablet Take 250 mg by mouth in the morning, at noon, in the evening, and at bedtime. 03/10/23  Yes [provider]  VITAMIN A PO Take by mouth daily.   Yes [provider]  vitamin E 180 MG (400 UNITS) capsule Take 400 Units by mouth daily.   Yes [provider]  vitamin k 100 MCG tablet Take 100 mcg by mouth daily.   Yes [provider]  estradiol (ESTRACE) 0.1 MG/GM vaginal cream PLACE 1/4 APPLICATORFUL IN THE VAGINA AT BEDTIME Patient not taking: Reported on 09/29/2023 10/30/22   Linzie Collin, MD  mupirocin ointment (BACTROBAN) 2 % Apply 1 application topically 2 (two) times daily. X 5 days nose Patient not taking: Reported on 09/29/2023 06/03/21   McLean-Scocuzza, Pasty Spillers, MD  PREVIDENT 5000 BOOSTER PLUS 1.1 % PSTE Place onto teeth as directed. 08/23/20   [provider]    Physical Exam: Vitals:   09/29/23 0440 09/29/23 0451 09/29/23 0500 09/29/23  0600  BP:  (!) 165/94 (!) 166/95 (!) 168/97  Pulse:  74 65 65  Resp:  18 13 17   Temp:      TempSrc:      SpO2:   99% 98%  Weight: 70.3 kg     Height: 5\' 7"  (1.702 m)      Physical Exam Constitutional:      Appearance: She is normal weight.  HENT:     Head: Normocephalic and atraumatic.     Nose: Nose normal.     Mouth/Throat:     Mouth: Mucous membranes are moist.  Eyes:     Pupils: Pupils are equal, round, and reactive to light.  Cardiovascular:     Rate and Rhythm: Normal rate and regular rhythm.  Pulmonary:     Effort: Pulmonary effort is normal.  Abdominal:     General: Bowel sounds are normal.  Musculoskeletal:        General: Normal range of motion.  Skin:    General: Skin is warm.     Comments: + mild abdominal wound from stitch removal. Minimal erythema  Neurological:     General: No focal deficit present.  Psychiatric:        Mood and Affect: Mood normal.     Data Reviewed:  There are no new results to review at this time.  Assessment and Plan: * Atrial fibrillation with RVR (HCC) New onset atrial fibrillation w/ RVR Noted HR into 160s on presentation  S/p IV diltiazem w/ HR improving into 60s  CHADsVASC score 2-3 Started on treatment dose lovenox in ER  Pending VQ scan w/ noted elevated d dimer and contrast allergy  Trop WNL  2D ECHO  Cardiology consult  Follow    Hypokalemia K2.9 Check mag level Replete Follow  Open abdominal wall wound Small abdominal wound irritation status post staple removal No overt signs of infection at present Will otherwise monitor Wound care as appropriate Follow-up  Hypertension BP stable  Titrate home regimen    Primary biliary cirrhosis (HCC) 05/2020 [facility] Dr Guido Sander: Patient has AMA positive PBC, biopsy proven.  On ursodiol    Multinodular goiter Followed by endocrinology  Clinically euthyroid  TSH/T4 today WNL  Monitor   H/O gastric bypass On multiple fat soluble supplements    T2DM (type 2  diabetes mellitus) (HCC) Blood sugar  in 27s  Monitor     Greater than 50% was spent in counseling and coordination of care with patient Total encounter time 80 minutes or more    Advance Care Planning:   Code Status: Not on file   Consults: Cardiology   Family Communication: No family at the bedside   Severity of Illness: The appropriate patient status for this patient is INPATIENT. Inpatient status is judged to be reasonable and necessary in order to provide the required intensity of service to ensure the patient's safety. The patient's presenting symptoms, physical exam findings, and initial radiographic and laboratory data in the context of their chronic comorbidities is felt to place them at high risk for further clinical deterioration. Furthermore, it is not anticipated that the patient will be medically stable for discharge from the hospital within 2 midnights of admission.   * I certify that at the point of admission it is my clinical judgment that the patient will require inpatient hospital care spanning beyond 2 midnights from the point of admission due to high intensity of service, high risk for further deterioration and high frequency of surveillance required.*  Author: Floydene Flock, MD 09/29/2023 9:17 AM  For on call review [email or web address].

## 2023-09-29 NOTE — Assessment & Plan Note (Signed)
Followed by endocrinology  Clinically euthyroid  TSH/T4 today WNL  Monitor

## 2023-09-29 NOTE — Assessment & Plan Note (Signed)
Small abdominal wound irritation status post staple removal No overt signs of infection at present Will otherwise monitor Wound care as appropriate Follow-up

## 2023-09-29 NOTE — Assessment & Plan Note (Signed)
Replaced

## 2023-09-29 NOTE — Consult Note (Signed)
PHARMACY CONSULT NOTE - ELECTROLYTES  Pharmacy Consult for Electrolyte Monitoring and Replacement   Recent Labs: Height: 5\' 7"  (170.2 cm) Weight: 70.3 kg (155 lb) IBW/kg (Calculated) : 61.6 Estimated Creatinine Clearance: 75.4 mL/min (by C-G formula based on SCr of 0.59 mg/dL).  Potassium (mmol/L)  Date Value  09/29/2023 3.1 (L)  03/08/2012 4.1   Magnesium (mg/dL)  Date Value  40/98/1191 2.2   Calcium (mg/dL)  Date Value  47/82/9562 8.9   Calcium, Total (mg/dL)  Date Value  13/06/6577 9.2   Albumin (g/dL)  Date Value  46/96/2952 3.8  04/07/2023 3.8   Phosphorus (mg/dL)  Date Value  84/13/2440 3.9   Sodium (mmol/L)  Date Value  09/29/2023 142  04/07/2023 142  03/08/2012 142   Assessment  [name] is a 56 y.o. female presenting with heart palpitations and found to be in atrial fibrillation with RVR. Pharmacy has been consulted to monitor and replace electrolytes.  Diet: PO  MIVF: None  Goal of Therapy:  K > 4.0  and < 5.0 and Mag > 2.0  All other electrolytes WNL  Plan:  K+ 2.9 > 3.1 after KCl 10 mEq IV x 2 plus KCl 40 mEq PO x 1  Will reorder KCl 10 mEq IV x 2 plus KCl 40 mEq PO x 1  Check BMP, Mg, Phos with AM labs  Thank you for allowing pharmacy to be a part of this patient's care.  Littie Deeds, PharmD Pharmacy Resident  09/29/2023 4:19 PM

## 2023-09-29 NOTE — ED Notes (Signed)
Pt to nuc med

## 2023-09-30 ENCOUNTER — Observation Stay (HOSPITAL_BASED_OUTPATIENT_CLINIC_OR_DEPARTMENT_OTHER)
Admit: 2023-09-30 | Discharge: 2023-09-30 | Disposition: A | Discharge status: Home / Self Care | Payer: Managed Care, Other (non HMO) | Attending: Family Medicine | Admitting: Family Medicine

## 2023-09-30 ENCOUNTER — Other Ambulatory Visit (HOSPITAL_COMMUNITY): Payer: Self-pay

## 2023-09-30 DIAGNOSIS — I480 Paroxysmal atrial fibrillation: Secondary | ICD-10-CM | POA: Diagnosis not present

## 2023-09-30 DIAGNOSIS — Z9884 Bariatric surgery status: Secondary | ICD-10-CM | POA: Diagnosis not present

## 2023-09-30 DIAGNOSIS — R55 Syncope and collapse: Secondary | ICD-10-CM | POA: Diagnosis not present

## 2023-09-30 DIAGNOSIS — I10 Essential (primary) hypertension: Secondary | ICD-10-CM

## 2023-09-30 DIAGNOSIS — E876 Hypokalemia: Secondary | ICD-10-CM

## 2023-09-30 DIAGNOSIS — I4891 Unspecified atrial fibrillation: Secondary | ICD-10-CM

## 2023-09-30 DIAGNOSIS — E042 Nontoxic multinodular goiter: Secondary | ICD-10-CM

## 2023-09-30 DIAGNOSIS — S31109S Unspecified open wound of abdominal wall, unspecified quadrant without penetration into peritoneal cavity, sequela: Secondary | ICD-10-CM

## 2023-09-30 DIAGNOSIS — I159 Secondary hypertension, unspecified: Secondary | ICD-10-CM | POA: Diagnosis not present

## 2023-09-30 DIAGNOSIS — K743 Primary biliary cirrhosis: Secondary | ICD-10-CM

## 2023-09-30 LAB — CBC
HCT: 33.9 % — ABNORMAL LOW (ref 36.0–46.0)
Hemoglobin: 10.7 g/dL — ABNORMAL LOW (ref 12.0–15.0)
MCH: 27.2 pg (ref 26.0–34.0)
MCHC: 31.6 g/dL (ref 30.0–36.0)
MCV: 86 fL (ref 80.0–100.0)
Platelets: 252 10*3/uL (ref 150–400)
RBC: 3.94 MIL/uL (ref 3.87–5.11)
RDW: 17 % — ABNORMAL HIGH (ref 11.5–15.5)
WBC: 5.1 10*3/uL (ref 4.0–10.5)
nRBC: 0 % (ref 0.0–0.2)

## 2023-09-30 LAB — ECHOCARDIOGRAM COMPLETE
AR max vel: 2.85 cm2
AV Area VTI: 3.4 cm2
AV Area mean vel: 2.72 cm2
AV Mean grad: 3 mm[Hg]
AV Peak grad: 5.3 mm[Hg]
Ao pk vel: 1.15 m/s
Area-P 1/2: 2.11 cm2
Height: 67 in
MV VTI: 3.28 cm2
S' Lateral: 3.5 cm
Weight: 2480 [oz_av]

## 2023-09-30 LAB — COMPREHENSIVE METABOLIC PANEL
ALT: 24 U/L (ref 0–44)
AST: 26 U/L (ref 15–41)
Albumin: 3.1 g/dL — ABNORMAL LOW (ref 3.5–5.0)
Alkaline Phosphatase: 138 U/L — ABNORMAL HIGH (ref 38–126)
Anion gap: 9 (ref 5–15)
BUN: 11 mg/dL (ref 6–20)
CO2: 23 mmol/L (ref 22–32)
Calcium: 8.1 mg/dL — ABNORMAL LOW (ref 8.9–10.3)
Chloride: 111 mmol/L (ref 98–111)
Creatinine, Ser: 0.64 mg/dL (ref 0.44–1.00)
GFR, Estimated: >60 mL/min (ref >60)
Glucose, Bld: 134 mg/dL — ABNORMAL HIGH (ref 70–99)
Potassium: 4.1 mmol/L (ref 3.5–5.1)
Sodium: 143 mmol/L (ref 135–145)
Total Bilirubin: 0.3 mg/dL (ref <1.2)
Total Protein: 6.2 g/dL — ABNORMAL LOW (ref 6.5–8.1)

## 2023-09-30 LAB — PHOSPHORUS: Phosphorus: 2.9 mg/dL (ref 2.5–4.6)

## 2023-09-30 LAB — HIV ANTIBODY (ROUTINE TESTING W REFLEX): HIV Screen 4th Generation wRfx: NONREACTIVE

## 2023-09-30 MED ORDER — APIXABAN 5 MG PO TABS: 5.0000 mg | ORAL_TABLET | Freq: Two times a day (BID) | ORAL | 0 refills | Status: DC

## 2023-09-30 MED ORDER — METOPROLOL SUCCINATE ER 25 MG PO TB24: 12.5000 mg | ORAL_TABLET | Freq: Every day | ORAL | 0 refills | Status: DC

## 2023-09-30 MED ORDER — AMLODIPINE BESYLATE 5 MG PO TABS: 5.0000 mg | ORAL_TABLET | Freq: Every day | ORAL | 0 refills | Status: DC

## 2023-09-30 NOTE — Discharge Summary (Signed)
Physician Discharge Summary   Patient: [name] MRN: [id number] DOB: 20-Feb-1966  Admit date:     09/29/2023  Discharge date: 09/30/23  Discharge Physician: [name]   PCP: Sarah Grana, FNP   Recommendations at discharge:   Follow-up PCP 5 days Follow-up cardiology 2 weeks  Discharge Diagnoses: Principal Problem:   Atrial fibrillation with RVR (HCC) Active Problems:   T2DM (type 2 diabetes mellitus) (HCC)   H/O gastric bypass   Multinodular goiter   Primary biliary cirrhosis (HCC)   Hypertension   Open abdominal wall wound   Hypokalemia   Near syncope    Hospital Course: 57 year old female past medical history of type 2 diabetes mellitus, primary biliary cirrhosis, goiter, 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  Patient drank an energy drink prior.  Patient found to be hypokalemic.  11/21.  Patient feeling much better.  Potassium improved.  Echocardiogram was normal.  Patient remained in sinus rhythm.  Patient started on low-dose Toprol and Eliquis.  Patient also started on Norvasc for hypertension.  Assessment and Plan: * Atrial fibrillation with RVR (HCC) New onset atrial fibrillation w/ RVR.  Paroxysmal in nature since in normal sinus rhythm. Noted HR into 160s on presentation  S/p IV diltiazem w/ HR improving into 60s  Patient started on Eliquis and low-dose Toprol.  Patient is in sinus rhythm.  Echocardiogram showed a normal EF.  TSH normal range.  Cardiology cleared for discharge home. Advised not to drink energy drinks.   Near syncope Likely secondary to fast heart rate.  Hypokalemia Replaced  Open abdominal wall wound Follow-up as outpatient  Hypertension Low-dose Toprol and Norvasc prescribed  Primary biliary cirrhosis (HCC) 05/2020 [facility] [name]: Patient has AMA positive PBC, biopsy proven.  On  ursodiol    Multinodular goiter Followed by endocrinology  Clinically euthyroid  TSH/T4 today WNL  Monitor   H/O gastric bypass On multiple fat soluble supplements    T2DM (type 2 diabetes mellitus) (HCC) Last hemoglobin A1c in our computer was 5 on 04/07/23.          Consultants: Cardiology Procedures performed: None Disposition: Home Diet recommendation:  Cardiac and Carb modified diet DISCHARGE MEDICATION: Allergies as of 09/30/2023       Reactions   Ivp Dye [iodinated Contrast Media]    Patient 'codes' with IV contrast   Latex Shortness Of Breath, Cough   Tolmetin Hives   Gallium Nitrate Hives   AND Gadolinium Derivatives   Insulin Detemir Hives, Swelling   Lump at injection site   Nsaids Hives   Per pt stated has to take ibuprofen with a antihistimine   Gadolinium Derivatives Hives   Pt c/o itching after contrast injection 03/15/19 @ 8:45am. [facility] Per [name] document this as allergic reaction.         Medication List     STOP taking these medications    estradiol 0.1 MG/GM vaginal cream Commonly known as: ESTRACE   mupirocin ointment 2 % Commonly known as: BACTROBAN       TAKE these medications    amLODipine 5 MG tablet Commonly known as: NORVASC Take 1 tablet (5 mg total) by mouth daily.   apixaban 5 MG Tabs tablet Commonly known as: ELIQUIS Take 1 tablet (5 mg total) by mouth 2 (two) times daily.   BARIATRIC MULTIVITAMINS/IRON PO Take by mouth.   calcium citrate 950 (200 Ca) MG tablet  Commonly known as: CALCITRATE - dosed in mg elemental calcium Take 200 mg of elemental calcium by mouth daily.   CALCIUM PO Take by mouth.   FRUITY CHEWS/IRON PO Take by mouth.   metoprolol succinate 25 MG 24 hr tablet Commonly known as: TOPROL-XL Take 0.5 tablets (12.5 mg total) by mouth daily.   PreviDent 5000 Booster Plus 1.1 % Pste Generic drug: Sodium Fluoride Place onto teeth as directed.   saw palmetto 80 MG capsule Take 80 mg  by mouth 2 (two) times daily.   sertraline 25 MG tablet Commonly known as: ZOLOFT Take 25-50 mg by mouth daily as needed (itching).   ursodiol 250 MG tablet Commonly known as: ACTIGALL Take 250 mg by mouth in the morning, at noon, in the evening, and at bedtime.   VITAMIN A PO Take by mouth daily.   VITAMIN D3 PO Take by mouth daily.   vitamin E 180 MG (400 UNITS) capsule Take 400 Units by mouth daily.   vitamin k 100 MCG tablet Take 100 mcg by mouth daily.        Follow-up Information     Sarah Grana, FNP Follow up in 5 day(s).   Specialty: Family Medicine Contact information: [address] [place] [address] [place] [address] [phone]         Sarah Iba, MD Follow up in 2 week(s).   Specialty: Cardiology Contact information: [address] [place] [address] [phone]                Discharge Exam: Ceasar Mons Weights   09/29/23 0440  Weight: 70.3 kg   Physical Exam HENT:     Head: Normocephalic.     Mouth/Throat:     Pharynx: No oropharyngeal exudate.  Eyes:     General: Lids are normal.     Conjunctiva/sclera: Conjunctivae normal.  Cardiovascular:     Rate and Rhythm: Normal rate and regular rhythm.     Heart sounds: Normal heart sounds, S1 normal and S2 normal.  Pulmonary:     Breath sounds: No decreased breath sounds, wheezing, rhonchi or rales.  Abdominal:     Palpations: Abdomen is soft.     Tenderness: There is no abdominal tenderness.  Musculoskeletal:     Right lower leg: No swelling.     Left lower leg: No swelling.  Skin:    Comments: Small superficial open wound abdomen.  Neurological:     Mental Status: She is alert and oriented to person, place, and time.      Condition at discharge: stable  The results of significant diagnostics from this hospitalization (including imaging, microbiology, ancillary and laboratory) are listed below for reference.   Imaging Studies: ECHOCARDIOGRAM  COMPLETE  Result Date: 09/30/2023    ECHOCARDIOGRAM REPORT   Patient Name:   [name] Date of Exam: 09/30/2023 Medical Rec #:  [id number]             Height:       67.0 in Accession #:    [id number]            Weight:       155.0 lb Date of Birth:  04-16-1966            BSA:          1.815 m Patient Age:    57 years              BP:  127/68 mmHg Patient Gender: F                     HR:           61 bpm. Exam Location:  [facility] Procedure: 2D Echo, Cardiac Doppler and Color Doppler Indications:     Atrial Fibrillation I48.91  History:         Patient has no prior history of Echocardiogram examinations.                  Risk Factors:Hypertension, Diabetes and Sleep Apnea.  Sonographer:     Cristela Blue Referring Phys:  [id number] Francoise Schaumann [place] Diagnosing Phys: Julien Nordmann MD IMPRESSIONS  1. Left ventricular ejection fraction, by estimation, is 55 to 60%. The left ventricle has normal function. The left ventricle has no regional wall motion abnormalities. Left ventricular diastolic parameters are consistent with Grade I diastolic dysfunction (impaired relaxation).  2. Right ventricular systolic function is normal. The right ventricular size is normal. There is normal pulmonary artery systolic pressure. The estimated right ventricular systolic pressure is 18.4 mmHg.  3. The mitral valve is normal in structure. No evidence of mitral valve regurgitation. No evidence of mitral stenosis.  4. The aortic valve is tricuspid. Aortic valve regurgitation is not visualized. Aortic valve sclerosis is present, with no evidence of aortic valve stenosis.  5. The inferior vena cava is normal in size with greater than 50% respiratory variability, suggesting right atrial pressure of 3 mmHg. FINDINGS  Left Ventricle: Left ventricular ejection fraction, by estimation, is 55 to 60%. The left ventricle has normal function. The left ventricle has no regional wall motion abnormalities. The left ventricular internal cavity  size was normal in size. There is  no left ventricular hypertrophy. Left ventricular diastolic parameters are consistent with Grade I diastolic dysfunction (impaired relaxation). Right Ventricle: The right ventricular size is normal. No increase in right ventricular wall thickness. Right ventricular systolic function is normal. There is normal pulmonary artery systolic pressure. The tricuspid regurgitant velocity is 1.83 m/s, and  with an assumed right atrial pressure of 5 mmHg, the estimated right ventricular systolic pressure is 18.4 mmHg. Left Atrium: Left atrial size was normal in size. Right Atrium: Right atrial size was normal in size. Pericardium: There is no evidence of pericardial effusion. Mitral Valve: The mitral valve is normal in structure. No evidence of mitral valve regurgitation. No evidence of mitral valve stenosis. MV peak gradient, 2.9 mmHg. The mean mitral valve gradient is 1.0 mmHg. Tricuspid Valve: The tricuspid valve is normal in structure. Tricuspid valve regurgitation is mild . No evidence of tricuspid stenosis. Aortic Valve: The aortic valve is tricuspid. Aortic valve regurgitation is not visualized. Aortic valve sclerosis is present, with no evidence of aortic valve stenosis. Aortic valve mean gradient measures 3.0 mmHg. Aortic valve peak gradient measures 5.3  mmHg. Aortic valve area, by VTI measures 3.40 cm. Pulmonic Valve: The pulmonic valve was normal in structure. Pulmonic valve regurgitation is not visualized. No evidence of pulmonic stenosis. Aorta: The aortic root is normal in size and structure. Venous: The inferior vena cava is normal in size with greater than 50% respiratory variability, suggesting right atrial pressure of 3 mmHg. IAS/Shunts: No atrial level shunt detected by color flow Doppler.  LEFT VENTRICLE PLAX 2D LVIDd:         5.20 cm   Diastology LVIDs:         3.50 cm   LV e' medial:  6.20 cm/s LV PW:         0.90 cm   LV E/e' medial:  9.2 LV IVS:        1.20 cm   LV  e' lateral:   9.90 cm/s LVOT diam:     2.20 cm   LV E/e' lateral: 5.8 LV SV:         81 LV SV Index:   45 LVOT Area:     3.80 cm  RIGHT VENTRICLE RV Basal diam:  2.80 cm RV Mid diam:    2.90 cm RV S prime:     12.20 cm/s TAPSE (M-mode): 2.3 cm LEFT ATRIUM             Index        RIGHT ATRIUM          Index LA diam:        3.20 cm 1.76 cm/m   RA Area:     7.47 cm LA Vol (A2C):   42.2 ml 23.26 ml/m  RA Volume:   12.10 ml 6.67 ml/m LA Vol (A4C):   34.5 ml 19.01 ml/m LA Biplane Vol: 39.8 ml 21.93 ml/m  AORTIC VALVE AV Area (Vmax):    2.85 cm AV Area (Vmean):   2.72 cm AV Area (VTI):     3.40 cm AV Vmax:           115.00 cm/s AV Vmean:          81.900 cm/s AV VTI:            0.239 m AV Peak Grad:      5.3 mmHg AV Mean Grad:      3.0 mmHg LVOT Vmax:         86.20 cm/s LVOT Vmean:        58.500 cm/s LVOT VTI:          0.214 m LVOT/AV VTI ratio: 0.90  AORTA Ao Root diam: 3.10 cm MITRAL VALVE               TRICUSPID VALVE MV Area (PHT): 2.11 cm    TR Peak grad:   13.4 mmHg MV Area VTI:   3.28 cm    TR Vmax:        183.00 cm/s MV Peak grad:  2.9 mmHg MV Mean grad:  1.0 mmHg    SHUNTS MV Vmax:       0.85 m/s    Systemic VTI:  0.21 m MV Vmean:      44.1 cm/s   Systemic Diam: 2.20 cm MV Decel Time: 360 msec MV E velocity: 57.10 cm/s MV A velocity: 76.00 cm/s MV E/A ratio:  0.75 Julien Nordmann MD Electronically signed by Julien Nordmann MD Signature Date/Time: 09/30/2023/11:27:03 AM    Final    NM Pulmonary Perfusion  Result Date: 09/29/2023 CLINICAL DATA:  Positive D-dimer.  Concern for pulmonary embolism. EXAM: NUCLEAR MEDICINE PERFUSION LUNG SCAN TECHNIQUE: Perfusion images were obtained in multiple projections after intravenous injection of radiopharmaceutical. RADIOPHARMACEUTICALS:  4.38 mCi Tc-18m MAA COMPARISON:  Chest radiograph 09/29/2023 FINDINGS: No wedge-shaped peripheral perfusion defect within LEFT or RIGHT lung to suggest acute pulmonary embolism. Normal perfusion pattern. IMPRESSION: Normal  perfusion exam.  No evidence acute pulmonary embolism. Electronically Signed   By: Genevive Bi M.D.   On: 09/29/2023 12:44   DG Chest Portable 1 View  Result Date: 09/29/2023 CLINICAL DATA:  57 year old female with chest pain and shortness of breath. EXAM: PORTABLE CHEST 1 VIEW  COMPARISON:  Chest radiographs 11/29/2018 and earlier. FINDINGS: Portable AP upright view at 0447 hours. Mediastinal contours are stable, mild chronic tortuosity of the thoracic aorta. Normal heart size. SIRT trachea normal lung volumes. Allowing for portable technique the lungs are clear. No pneumothorax or pleural effusion. No acute osseous abnormality identified. Negative visible bowel gas. IMPRESSION: No acute cardiopulmonary abnormality. Electronically Signed   By: Odessa Fleming M.D.   On: 09/29/2023 05:14    Microbiology: Results for orders placed or performed in visit on 05/10/23  Wound culture     Status: Abnormal   Collection Time: 05/10/23  3:29 PM   Specimen: Wound  Result Value Ref Range Status   MICRO NUMBER: [id number]  Final   SPECIMEN QUALITY: Adequate  Final   SOURCE: WOUND  Final   STATUS: FINAL  Final   GRAM STAIN:   Final    Many White blood cells seen No epithelial cells seen No organisms seen   ISOLATE 1: Staphylococcus aureus (A)  Final    Comment: Heavy growth of Staphylococcus aureus      Susceptibility   Staphylococcus aureus - AEROBIC CULT, GRAM STAIN POSITIVE 1    VANCOMYCIN 1 Sensitive     CIPROFLOXACIN <=0.5 Sensitive     CLINDAMYCIN <=0.25 Sensitive     LEVOFLOXACIN 0.25 Sensitive     ERYTHROMYCIN 0.5 Sensitive     GENTAMICIN <=0.5 Sensitive     OXACILLIN* <=0.25 Sensitive      * Oxacillin susceptible staphylococci are susceptible to other penicillinase-stable penicillins (e.g., methicillin, nafcillin), beta- lactam/beta-lactamase inhibitor combinations, and cephems with staphylococcal indications, including cefazolin.     TETRACYCLINE <=1 Sensitive     TRIMETH/SULFA* <=10  Sensitive      * Oxacillin susceptible staphylococci are susceptible to other penicillinase-stable penicillins (e.g., methicillin, nafcillin), beta- lactam/beta-lactamase inhibitor combinations, and cephems with staphylococcal indications, including cefazolin. Legend: S = Susceptible  I = Intermediate R = Resistant  NS = Not susceptible * = Not tested  NR = Not reported **NN = See antimicrobic comments     Labs: CBC: Recent Labs  Lab 09/29/23 0442 09/30/23 0436  WBC 6.7 5.1  NEUTROABS 3.6  --   HGB 12.2 10.7*  HCT 38.6 33.9*  MCV 85.2 86.0  PLT 275 252   Basic Metabolic Panel: Recent Labs  Lab 09/29/23 0442 09/29/23 1434 09/29/23 2223 09/30/23 0436  NA 142  --   --  143  K 2.9* 3.1* 4.5 4.1  CL 111  --   --  111  CO2 22  --   --  23  GLUCOSE 86  --   --  134*  BUN 14  --   --  11  CREATININE 0.59  --   --  0.64  CALCIUM 8.9  --   --  8.1*  MG 2.2  --   --   --   PHOS  --   --   --  2.9   Liver Function Tests: Recent Labs  Lab 09/29/23 0442 09/30/23 0436  AST 33 26  ALT 28 24  ALKPHOS 154* 138*  BILITOT 0.5 0.3  PROT 7.9 6.2*  ALBUMIN 3.8 3.1*   CBG: No results for input(s): "GLUCAP" in the last 168 hours.  Discharge time spent: greater than 30 minutes.  Signed: Alford Highland, MD [facility] 09/30/2023

## 2023-09-30 NOTE — Consult Note (Signed)
PHARMACY CONSULT NOTE - ELECTROLYTES  Pharmacy Consult for Electrolyte Monitoring and Replacement   Recent Labs: Height: 5\' 7"  (170.2 cm) Weight: 70.3 kg (155 lb) IBW/kg (Calculated) : 61.6 Estimated Creatinine Clearance: 75.4 mL/min (by C-G formula based on SCr of 0.64 mg/dL).  Potassium (mmol/L)  Date Value  09/30/2023 4.1  03/08/2012 4.1   Magnesium (mg/dL)  Date Value  38/75/6433 2.2   Calcium (mg/dL)  Date Value  29/51/8841 8.1 (L)   Calcium, Total (mg/dL)  Date Value  66/04/3015 9.2   Albumin (g/dL)  Date Value  11/17/3233 3.1 (L)  04/07/2023 3.8   Phosphorus (mg/dL)  Date Value  57/32/2025 2.9   Sodium (mmol/L)  Date Value  09/30/2023 143  04/07/2023 142  03/08/2012 142   Assessment  [name] is a 57 y.o. female presenting with heart palpitations and found to be in atrial fibrillation with RVR. Pharmacy has been consulted to monitor and replace electrolytes.  Goal of Therapy:  Potassium  4.0 - 5.1 mmol/L Magnesium 20. - 2.4 mg/dL All other electrolytes WNL  Plan:  No electrolyte replacement warranted for today Check BMP, Mg, Phos with AM labs  Thank you for allowing pharmacy to be a part of this patient's care.  Lowella Bandy, PharmD 09/30/2023 7:43 AM

## 2023-09-30 NOTE — Hospital Course (Signed)
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  Patient drank an energy drink prior.  Patient found to be hypokalemic.  11/21.  Patient feeling much better.  Potassium improved.  Echocardiogram was normal.  Patient remained in sinus rhythm.  Patient started on low-dose Toprol and Eliquis.  Patient also started on Norvasc for hypertension.

## 2023-09-30 NOTE — Progress Notes (Signed)
*  PRELIMINARY RESULTS* Echocardiogram 2D Echocardiogram has been performed.  [name] 09/30/2023, 8:50 AM

## 2023-09-30 NOTE — Progress Notes (Signed)
Patient Name: [name] Date of Encounter: 09/30/2023 [facility] Cardiologist: None   Interval Summary  .    Kelynn Marie Lorenzetti is a 57 y.o. female with a hx of type 2 diabetes, primary biliary cirrhosis, GERD, status post bariatric surgery with fat-soluble vitamin deficiency, hypertension who is being seen today for continued evaluation of new onset atrial fibrillation RVR and essential hypertension. She presented to [facility] emergency department on 11/20 AM with palpitations, SOB, and dizziness. No prior history of arrhythmia. Did admit to using caffeine prior to onset of symptoms. Was found to have new onset atrial fibrillation with RVR via EMS rhythm strips and on telemetry overnight in ED. Patient received diltiazem push, converting to NSR. Also noted to be hypertensive, initial systolic pressure 215 and continued to run in 170-180s. Does not take pressure medications as outpatient. Started on eliquis 5 mg, metoprolol succinate 12.5 and amlodipine 5 daily. Hypokalemia also noted with initial value of 2.9 up to 3.1.   Today patient reports feeling well and without symptoms of palpitations, SOB, and chest pain. Pressures are down with systolic in the 140-150s. Potassium is up to 4.1.   Echocardiogram 09/30/2023 results show:   1. Left ventricular ejection fraction, by estimation, is 55 to 60%. The  left ventricle has normal function. The left ventricle has no regional  wall motion abnormalities. Left ventricular diastolic parameters are  consistent with Grade I diastolic  dysfunction (impaired relaxation).   2. Right ventricular systolic function is normal. The right ventricular  size is normal. There is normal pulmonary artery systolic pressure. The  estimated right ventricular systolic pressure is 18.4 mmHg.   3. The mitral valve is normal in structure. No evidence of mitral valve  regurgitation. No evidence of mitral stenosis.   4. The aortic valve is tricuspid.  Aortic valve regurgitation is not  visualized. Aortic valve sclerosis is present, with no evidence of aortic  valve stenosis.   5. The inferior vena cava is normal in size with greater than 50%  respiratory variability, suggesting right atrial pressure of 3 mmHg.   Vital Signs .    Vitals:   09/30/23 0100 09/30/23 0300 09/30/23 0500 09/30/23 0600  BP: (!) 152/89 (!) 150/74  127/68  Pulse: 61 (!) 52  61  Resp: 20 17  20   Temp:   98.4 F (36.9 C)   TempSrc:   Oral   SpO2: 96% 96%  96%  Weight:      Height:       No intake or output data in the 24 hours ending 09/30/23 0808    09/29/2023    4:40 AM 06/23/2023    9:13 AM 06/03/2023    9:46 AM  Last 3 Weights  Weight (lbs) 155 lb 168 lb 3.2 oz 171 lb  Weight (kg) 70.308 kg 76.295 kg 77.565 kg      Telemetry/ECG    Normal sinus rhythm, rate 60 bpm - Personally Reviewed  Physical Exam .   GEN: No acute distress.   Neck: No JVD Cardiac: RRR, no murmurs, rubs, or gallops.  Respiratory: Clear to auscultation bilaterally. GI: Soft, nontender, non-distended  MS: No edema  Assessment & Plan .     New onset atrial fibrillation with RVR  Patient reports that symptoms have resolved. Echocardiogram results reviewed and within normal limits. Plan to discharge patient with eliquis 5 mg and metoprolol succinate 12.5 mg daily. Discussed cessation of energy drinks and rhythm monitoring with Apple watch.  Essential hypertension Blood pressures have decreased since yesterday with average systolic in the 140s. Patient started on amlodipine 5 mg with ability to titrate up for further pressure control. Patient should follow up with PCP to monitor blood pressure control.   Hypokalemia Potassium has increased to 4.1 today. Recommend patient monitor outpatient with PCP and further discuss need for supplementation.   Status post bariatric surgery, malabsorption Takes supplemental vitamins ADEK   For questions or updates, please contact [facility] Please consult [email or web address] for contact info under        Signed, Orion Crook, PA-C

## 2023-09-30 NOTE — Assessment & Plan Note (Signed)
Likely secondary to fast heart rate.

## 2023-10-01 ENCOUNTER — Ambulatory Visit: Payer: Managed Care, Other (non HMO) | Admitting: Family

## 2023-10-01 ENCOUNTER — Encounter: Payer: Self-pay | Admitting: Family

## 2023-10-01 ENCOUNTER — Telehealth: Payer: Self-pay | Admitting: Family

## 2023-10-01 ENCOUNTER — Encounter: Payer: Self-pay | Admitting: *Deleted

## 2023-10-01 ENCOUNTER — Telehealth: Payer: Self-pay | Admitting: Cardiology

## 2023-10-01 VITALS — BP 150/90 | HR 77 | Temp 97.9°F | Resp 17 | Ht 66.5 in | Wt 168.0 lb

## 2023-10-01 DIAGNOSIS — I159 Secondary hypertension, unspecified: Secondary | ICD-10-CM | POA: Diagnosis not present

## 2023-10-01 DIAGNOSIS — R87618 Other abnormal cytological findings on specimens from cervix uteri: Secondary | ICD-10-CM | POA: Diagnosis not present

## 2023-10-01 DIAGNOSIS — I480 Paroxysmal atrial fibrillation: Secondary | ICD-10-CM | POA: Diagnosis not present

## 2023-10-01 DIAGNOSIS — Z0000 Encounter for general adult medical examination without abnormal findings: Secondary | ICD-10-CM

## 2023-10-01 DIAGNOSIS — Z0001 Encounter for general adult medical examination with abnormal findings: Secondary | ICD-10-CM | POA: Diagnosis not present

## 2023-10-01 DIAGNOSIS — Z113 Encounter for screening for infections with a predominantly sexual mode of transmission: Secondary | ICD-10-CM

## 2023-10-01 MED ORDER — APIXABAN 5 MG PO TABS: 5.0000 mg | ORAL_TABLET | Freq: Two times a day (BID) | ORAL | 1 refills | Status: DC

## 2023-10-01 MED ORDER — AMLODIPINE BESYLATE 5 MG PO TABS: 5.0000 mg | ORAL_TABLET | Freq: Every day | ORAL | 3 refills | Status: DC

## 2023-10-01 MED ORDER — METOPROLOL SUCCINATE ER 25 MG PO TB24: 12.5000 mg | ORAL_TABLET | Freq: Every day | ORAL | 1 refills | Status: DC

## 2023-10-01 NOTE — Assessment & Plan Note (Signed)
Congratulated patient on diligence to exercise.  Clinical breast exam performed today.  Pap smear obtained.  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  Will collaborate with Dr. Logan Bores if HPV positive. Colonoscopy, endoscopy scheduled 1216/2024 with [facility] ( no notes yet in careeverywhere).

## 2023-10-01 NOTE — Telephone Encounter (Signed)
Left voicemail to schedule a 3-4wk hospital follow up with S. Hammock, please schedule.

## 2023-10-01 NOTE — Telephone Encounter (Signed)
-----   Message from [facility] H[facility] sent at 10/01/2023 12:26 PM EST ----- Regarding: hospital follow up Please schedule a hospital follow up with me in 3-4 weeks for new onset atrial fibrillation  Thanks, [employer]

## 2023-10-01 NOTE — Assessment & Plan Note (Signed)
Elevated today.  I refilled amlodipine 5 mg for patient to start.  Blood pressure goal less than 130/80

## 2023-10-01 NOTE — Assessment & Plan Note (Addendum)
Reviewed hospitalization, medications reconciled.  Fortunately patient is asymptomatic .I have refilled Eliquis 5 mg twice daily, Toprol 12.5 mg daily, amlodipine 5 mg daily.  Patient will purchase Apple Watch.  Referral to cardiology for follow-up.  I have also notified Dr. Mariah Milling regarding upcoming colonoscopy/endoscopy as a relates to Eliquis.  Of note, standing BMP order x 6 months to evaluate for recurrence hypokalemia of unknown etiology

## 2023-10-01 NOTE — Progress Notes (Signed)
Assessment & Plan:  Paroxysmal atrial fibrillation with RVR (HCC) Assessment & Plan: Reviewed hospitalization, medications reconciled.  Fortunately patient is asymptomatic .I have refilled Eliquis 5 mg twice daily, Toprol 12.5 mg daily, amlodipine 5 mg daily.  Patient will purchase Apple Watch.  Referral to cardiology for follow-up.  I have also notified Dr. Mariah Milling regarding upcoming colonoscopy/endoscopy as a relates to Eliquis.  Of note, standing BMP order x 6 months to evaluate for recurrence hypokalemia of unknown etiology  Orders: -     Apixaban; Take 1 tablet (5 mg total) by mouth 2 (two) times daily.  Dispense: 180 tablet; Refill: 1 -     Ambulatory referral to Cardiology -     Metoprolol Succinate ER; Take 0.5 tablets (12.5 mg total) by mouth daily.  Dispense: 45 tablet; Refill: 1 -     Basic metabolic panel; Standing  Pap smear abnormality of cervix/human papillomavirus (HPV) positive  Screen for STD (sexually transmitted disease) -     IGP, Aptima HPV -     Acute Hep Panel & Hep B Surface Ab -     RPR -     HSV-2 Ab, IgG -     WET PREP FOR TRICH, YEAST, CLUE  Routine general medical examination at a health care facility Assessment & Plan: Congratulated patient on diligence to exercise.  Clinical breast exam performed today.  Pap smear obtained.  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  Will collaborate with Dr. Logan Bores if HPV positive. Colonoscopy, endoscopy scheduled 1216/2024 with [facility] ( no notes yet in careeverywhere).    Secondary hypertension Assessment & Plan: Elevated today.  I refilled amlodipine 5 mg for patient to start.  Blood pressure goal less than 130/80  Orders: -     amLODIPine Besylate; Take 1 tablet (5 mg total) by mouth daily.  Dispense: 90 tablet; Refill: 3     Return precautions given.   Risks, benefits, and alternatives of the medications and treatment plan prescribed today were discussed, and patient expressed understanding.    Education regarding symptom management and diagnosis given to patient on AVS either electronically or printed.  No follow-ups on file.  Rennie Plowman, FNP  Subjective:    Patient ID: [name], female    DOB: Oct 14, 1966, 57 y.o.   MRN: [id number]  CC: [name] is a 57 y.o. female who presents today for physical exam.    HPI: She feels well today. No new complaints  Palpitations have resolved.   She has not started amlodipine, toprol or eliquis as was only given 30-day supply.  She request refill today.   She denies N, V.  She is planning on purchasing an apple watch.      She requests STD testing, with the exception of HIV ( negative 09/29/23) .  She is not sexually active at this time.   Hospitalization follow-up; she was admitted 09/29/2023 and discharged 09/30/2023  Presented for palpitations.  Intermittent atrial fibrillation rate in 160s. Status post IV diltiazem with heart rate improving.  Started on Eliquis 5mg  BID,  low-dose Toprol 12.5mg , amlodipine 5mg  at discharge   Hospital course significant for hypokalemia.  Discharged on sinus rhythm.  Echocardiogram with normal ejection fraction, 55 to 60%.  Grade 1 diastolic dysfunction.  TSH within normal range.  Advised to not drink energy drinks Cardiology consult 09/29/2023.  K 4.1 09/30/23  Colorectal Cancer Screening: UTD , obtained 09/2021, repeat in 5 years; scheduled EGD and colonoscopy next month  with [facility] 10/25/23  Breast Cancer Screening: Mammogram UTD Cervical Cancer Screening: due; Pap 09/18/2022, positive HPV Consult Dr. 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  Video visit follow-up 11/11/2022 advised repeat Pap smear in 1 year with viral typing.   Bone Health screening/DEXA for 65+: scheduled  Lung Cancer Screening: Doesn't have 20 year pack year history  and age > 69 years yo 80 years        Tetanus - UTD Exercise: Gets regular exercise with personal trainer T/Thursdays.  Alcohol use:  none Smoking/tobacco use: Nonsmoker.    Health Maintenance  Topic Date Due   Yearly kidney health urinalysis for diabetes  09/11/2015   Eye exam for diabetics  08/12/2016   Complete foot exam   02/25/2017   COVID-19 Vaccine (3 - 2023-24 season) 07/11/2023   Pap Smear  09/19/2023   Flu Shot  02/07/2024*   Hemoglobin A1C  10/08/2023   Colon Cancer Screening  09/10/2024   Yearly kidney function blood test for diabetes  09/29/2024   Mammogram  06/09/2025   DTaP/Tdap/Td vaccine (2 - Td or Tdap) 02/25/2026   Hepatitis C Screening  Completed   HIV Screening  Completed   Zoster (Shingles) Vaccine  Completed   HPV Vaccine  Aged Out  *Topic was postponed. The date shown is not the original due date.    ALLERGIES: Ivp dye [iodinated contrast media], Latex, Tolmetin, Gallium nitrate, Insulin detemir, Nsaids, and Gadolinium derivatives  Current Outpatient Medications on File Prior to Visit  Medication Sig Dispense Refill   calcium citrate (CALCITRATE - DOSED IN MG ELEMENTAL CALCIUM) 950 (200 Ca) MG tablet Take 200 mg of elemental calcium by mouth daily.     CALCIUM PO Take by mouth.     Cholecalciferol (VITAMIN D3 PO) Take by mouth daily.     Multiple Vitamins-Minerals (BARIATRIC MULTIVITAMINS/IRON PO) Take by mouth.     Pediatric Multivitamins-Iron (FRUITY CHEWS/IRON PO) Take by mouth.     PREVIDENT 5000 BOOSTER PLUS 1.1 % PSTE Place onto teeth as directed.     saw palmetto 80 MG capsule Take 80 mg by mouth 2 (two) times daily.     sertraline (ZOLOFT) 25 MG tablet Take 25-50 mg by mouth daily as needed (itching).     ursodiol (ACTIGALL) 250 MG tablet Take 250 mg by mouth in the morning, at noon, in the evening, and at bedtime.     VITAMIN A PO Take by mouth daily.     vitamin E 180 MG (400 UNITS) capsule Take 400 Units by mouth daily.     vitamin k 100  MCG tablet Take 100 mcg by mouth daily.     No current facility-administered medications on file prior to visit.    Review of Systems  Constitutional:  Negative for chills, fever and unexpected weight change.  HENT:  Negative for congestion.   Respiratory:  Negative for cough.   Cardiovascular:  Negative for chest pain, palpitations and leg swelling.  Gastrointestinal:  Negative for nausea and vomiting.  Musculoskeletal:  Negative for arthralgias and myalgias.  Skin:  Negative for rash.  Neurological:  Negative for headaches.  Hematological:  Negative for adenopathy.  Psychiatric/Behavioral:  Negative for confusion.       Objective:    BP (!) 150/90   Pulse 77   Temp 97.9 F (36.6 C) (Oral)   Resp 17   Ht 5' 6.5" (1.689  m)   Wt 168 lb (76.2 kg)   LMP 07/11/2016 (Exact Date)   SpO2 96%   BMI 26.71 kg/m   BP Readings from Last 3 Encounters:  10/01/23 (!) 150/90  09/30/23 (!) 162/75  06/23/23 132/84   Wt Readings from Last 3 Encounters:  10/01/23 168 lb (76.2 kg)  09/29/23 155 lb (70.3 kg)  06/23/23 168 lb 3.2 oz (76.3 kg)    Physical Exam Vitals reviewed.  Constitutional:      Appearance: She is well-developed.  Eyes:     Conjunctiva/sclera: Conjunctivae normal.  Neck:     Thyroid: No thyroid mass or thyromegaly.  Cardiovascular:     Rate and Rhythm: Normal rate and regular rhythm.     Pulses: Normal pulses.     Heart sounds: Normal heart sounds.  Pulmonary:     Effort: Pulmonary effort is normal.     Breath sounds: Normal breath sounds. No wheezing, rhonchi or rales.  Chest:  Breasts:    Breasts are symmetrical.     Right: No inverted nipple, mass, nipple discharge, skin change or tenderness.     Left: No inverted nipple, mass, nipple discharge, skin change or tenderness.  Genitourinary:    Cervix: No cervical motion tenderness, discharge or friability.     Uterus: Not enlarged, not fixed and not tender.      Adnexa:        Right: No mass, tenderness  or fullness.         Left: No mass, tenderness or fullness.       Comments: Pap performed. No CMT. Unable to appreciated ovaries. Lymphadenopathy:     Head:     Right side of head: No submental, submandibular, tonsillar, preauricular, posterior auricular or occipital adenopathy.     Left side of head: No submental, submandibular, tonsillar, preauricular, posterior auricular or occipital adenopathy.     Cervical: No cervical adenopathy.     Right cervical: No superficial, deep or posterior cervical adenopathy.    Left cervical: No superficial, deep or posterior cervical adenopathy.     Upper Body:     Right upper body: No pectoral adenopathy.     Left upper body: No pectoral adenopathy.  Skin:    General: Skin is warm and dry.  Neurological:     Mental Status: She is alert.  Psychiatric:        Speech: Speech normal.        Behavior: Behavior normal.        Thought Content: Thought content normal.

## 2023-10-01 NOTE — Patient Instructions (Signed)
Standing order for BMP lab to evaluate hypokalemia monthly for the next 6 months at [facility].  Please start Eliquis, Toprol and amlodipine as discussed today.  Goal blood pressure is less than 130/80.  Placed a referral Dr. Mariah Milling.  Also reached out to him in regards to colonoscopy, endoscopy planned for next month as it relates to Eliquis and suspension thereof.   Health Maintenance for Postmenopausal Women Menopause is a normal process in which your ability to get pregnant comes to an end. This process happens slowly over many months or years, usually between the ages of 2 and 90. Menopause is complete when you have missed your menstrual period for 12 months. It is important to talk with your health care provider about some of the most common conditions that affect women after menopause (postmenopausal women). These include heart disease, cancer, and bone loss (osteoporosis). Adopting a healthy lifestyle and getting preventive care can help to promote your health and wellness. The actions you take can also lower your chances of developing some of these common conditions. What are the signs and symptoms of menopause? During menopause, you may have the following symptoms: Hot flashes. These can be moderate or severe. Night sweats. Decrease in sex drive. Mood swings. Headaches. Tiredness (fatigue). Irritability. Memory problems. Problems falling asleep or staying asleep. Talk with your health care provider about treatment options for your symptoms. Do I need hormone replacement therapy? Hormone replacement therapy is effective in treating symptoms that are caused by menopause, such as hot flashes and night sweats. Hormone replacement carries certain risks, especially as you become older. If you are thinking about using estrogen or estrogen with progestin, discuss the benefits and risks with your health care provider. How can I reduce my risk for heart disease and stroke? The risk of heart  disease, heart attack, and stroke increases as you age. One of the causes may be a change in the body's hormones during menopause. This can affect how your body uses dietary fats, triglycerides, and cholesterol. Heart attack and stroke are medical emergencies. There are many things that you can do to help prevent heart disease and stroke. Watch your blood pressure High blood pressure causes heart disease and increases the risk of stroke. This is more likely to develop in people who have high blood pressure readings or are overweight. Have your blood pressure checked: Every 3-5 years if you are 68-29 years of age. Every year if you are 60 years old or older. Eat a healthy diet  Eat a diet that includes plenty of vegetables, fruits, low-fat dairy products, and lean protein. Do not eat a lot of foods that are high in solid fats, added sugars, or sodium. Get regular exercise Get regular exercise. This is one of the most important things you can do for your health. Most adults should: Try to exercise for at least 150 minutes each week. The exercise should increase your heart rate and make you sweat (moderate-intensity exercise). Try to do strengthening exercises at least twice each week. Do these in addition to the moderate-intensity exercise. Spend less time sitting. Even light physical activity can be beneficial. Other tips Work with your health care provider to achieve or maintain a healthy weight. Do not use any products that contain nicotine or tobacco. These products include cigarettes, chewing tobacco, and vaping devices, such as e-cigarettes. If you need help quitting, ask your health care provider. Know your numbers. Ask your health care provider to check your cholesterol and your blood sugar (  glucose). Continue to have your blood tested as directed by your health care provider. Do I need screening for cancer? Depending on your health history and family history, you may need to have cancer  screenings at different stages of your life. This may include screening for: Breast cancer. Cervical cancer. Lung cancer. Colorectal cancer. What is my risk for osteoporosis? After menopause, you may be at increased risk for osteoporosis. Osteoporosis is a condition in which bone destruction happens more quickly than new bone creation. To help prevent osteoporosis or the bone fractures that can happen because of osteoporosis, you may take the following actions: If you are 27-57 years old, get at least 1,000 mg of calcium and at least 600 international units (IU) of vitamin D per day. If you are older than age 47 but younger than age 67, get at least 1,200 mg of calcium and at least 600 international units (IU) of vitamin D per day. If you are older than age 51, get at least 1,200 mg of calcium and at least 800 international units (IU) of vitamin D per day. Smoking and drinking excessive alcohol increase the risk of osteoporosis. Eat foods that are rich in calcium and vitamin D, and do weight-bearing exercises several times each week as directed by your health care provider. How does menopause affect my mental health? Depression may occur at any age, but it is more common as you become older. Common symptoms of depression include: Feeling depressed. Changes in sleep patterns. Changes in appetite or eating patterns. Feeling an overall lack of motivation or enjoyment of activities that you previously enjoyed. Frequent crying spells. Talk with your health care provider if you think that you are experiencing any of these symptoms. General instructions See your health care provider for regular wellness exams and vaccines. This may include: Scheduling regular health, dental, and eye exams. Getting and maintaining your vaccines. These include: Influenza vaccine. Get this vaccine each year before the flu season begins. Pneumonia vaccine. Shingles vaccine. Tetanus, diphtheria, and pertussis (Tdap)  booster vaccine. Your health care provider may also recommend other immunizations. Tell your health care provider if you have ever been abused or do not feel safe at home. Summary Menopause is a normal process in which your ability to get pregnant comes to an end. This condition causes hot flashes, night sweats, decreased interest in sex, mood swings, headaches, or lack of sleep. Treatment for this condition may include hormone replacement therapy. Take actions to keep yourself healthy, including exercising regularly, eating a healthy diet, watching your weight, and checking your blood pressure and blood sugar levels. Get screened for cancer and depression. Make sure that you are up to date with all your vaccines. This information is not intended to replace advice given to you by your health care provider. Make sure you discuss any questions you have with your health care provider. Document Revised: 03/17/2021 Document Reviewed: 03/17/2021 Elsevier Patient Education  2024 [employer].

## 2023-10-01 NOTE — Telephone Encounter (Signed)
Dr Emeterio Reeve seeing [name] today for CPE and hospital follow up after Afib.   She has yet to start eliquis and toprol. I have refilled both so she can start.  Asymptomatic fortunately.   She has with [facility] ( in [place]) scheduled colonoscopy and EGD mid December.   Can you advise on when she can stop eliquis ahead of procedure? Or send to pre op team?    Referral is in place to your office as well for f/u visit.

## 2023-10-04 ENCOUNTER — Telehealth: Payer: Self-pay | Admitting: Cardiology

## 2023-10-04 ENCOUNTER — Telehealth: Payer: Self-pay | Admitting: Family

## 2023-10-04 NOTE — Telephone Encounter (Signed)
Left voicemail to schedule hospital follow up with S. Hammock, please schedule.

## 2023-10-04 NOTE — Telephone Encounter (Signed)
-----   Message from [facility] sent at 10/01/2023 12:26 PM EST ----- Regarding: hospital follow up Please schedule a hospital follow up with me in 3-4 weeks for new onset atrial fibrillation  Thanks, [employer]

## 2023-10-04 NOTE — Telephone Encounter (Signed)
Call [facility]  Pt needed to be screened for STDs including Trichomonas, gonorrhea chlamydia Looks like wet prep canceled from [facility].    Can you please call [facility] and ensure these test are added on?

## 2023-10-04 NOTE — Telephone Encounter (Signed)
[facility] told me they use a pouch for there wet preps that we do not have here. And they can not add STD testing to the PAP because it has already been prepped for the PAP.  So we changed the test code to 180021 (NuSwab Vaginits Plus)-that accepts the Aptima swab & they will fax over a add on sheet tomorrow. TAT for STD test results is 3-4 days. Send back to clinical pool to keep an eye on the e-fax so quickly print, sign, &  fax back to [facility].  I also told them how challenging it is to order cytology through [facility] & that each person I have spoken with in Friday has told me something different.  Please let me know if you have any issues getting results in a few days.  Thanks

## 2023-10-05 ENCOUNTER — Encounter: Payer: Self-pay | Admitting: Family

## 2023-10-05 LAB — WET PREP FOR TRICH, YEAST, CLUE

## 2023-10-05 NOTE — Telephone Encounter (Signed)
Noted. Sent pt a mychart message

## 2023-10-05 NOTE — Telephone Encounter (Signed)
I received the add on sheet, completed it & faxed it back to [facility].

## 2023-10-06 NOTE — Telephone Encounter (Signed)
Do you mind checking on status of [facility] labs?  I do not see HSV, Pap smear, RPR, hepatitis panel.  Does she need to recollect this?

## 2023-10-07 LAB — NUSWAB VAGINITIS PLUS (VG+)

## 2023-10-07 LAB — SPECIMEN STATUS REPORT

## 2023-10-11 ENCOUNTER — Other Ambulatory Visit: Payer: Self-pay | Admitting: Family

## 2023-10-11 ENCOUNTER — Encounter: Payer: Self-pay | Admitting: Family

## 2023-10-11 DIAGNOSIS — Z113 Encounter for screening for infections with a predominantly sexual mode of transmission: Secondary | ICD-10-CM

## 2023-10-11 DIAGNOSIS — B9689 Other specified bacterial agents as the cause of diseases classified elsewhere: Secondary | ICD-10-CM

## 2023-10-11 MED ORDER — METRONIDAZOLE 500 MG PO TABS: 500.0000 mg | ORAL_TABLET | Freq: Two times a day (BID) | ORAL | 0 refills | Status: DC

## 2023-10-12 ENCOUNTER — Other Ambulatory Visit: Payer: Self-pay | Admitting: Family

## 2023-10-12 ENCOUNTER — Encounter: Payer: Self-pay | Admitting: Family

## 2023-10-12 LAB — IGP, APTIMA HPV: HPV Aptima: POSITIVE — AB

## 2023-10-12 LAB — ACUTE HEP PANEL AND HEP B SURFACE AB
Hep A IgM: NEGATIVE
Hep B C IgM: NEGATIVE
Hep C Virus Ab: NONREACTIVE
Hepatitis B Surf Ab Quant: 24 m[IU]/mL
Hepatitis B Surface Ag: NEGATIVE

## 2023-10-12 LAB — HSV-2 AB, IGG: HSV 2 IgG, Type Spec: REACTIVE — AB

## 2023-10-12 LAB — RPR: RPR Ser Ql: NONREACTIVE

## 2023-10-13 LAB — BASIC METABOLIC PANEL
BUN/Creatinine Ratio: 21 (ref 9–23)
BUN: 16 mg/dL (ref 6–24)
CO2: 23 mmol/L (ref 20–29)
Calcium: 8.8 mg/dL (ref 8.7–10.2)
Chloride: 110 mmol/L — ABNORMAL HIGH (ref 96–106)
Creatinine, Ser: 0.75 mg/dL (ref 0.57–1.00)
Glucose: 106 mg/dL — ABNORMAL HIGH (ref 70–99)
Potassium: 4.2 mmol/L (ref 3.5–5.2)
Sodium: 146 mmol/L — ABNORMAL HIGH (ref 134–144)
eGFR: 93 mL/min/{1.73_m2} (ref >59)

## 2023-10-13 NOTE — Progress Notes (Signed)
Scheduled for a colposcopy on 10/19/23

## 2023-10-15 ENCOUNTER — Other Ambulatory Visit: Payer: Self-pay | Admitting: Family

## 2023-10-15 DIAGNOSIS — B009 Herpesviral infection, unspecified: Secondary | ICD-10-CM | POA: Insufficient documentation

## 2023-10-15 MED ORDER — VALACYCLOVIR HCL 1 G PO TABS: ORAL_TABLET | ORAL | 0 refills | Status: DC

## 2023-10-18 ENCOUNTER — Ambulatory Visit
Admission: RE | Admit: 2023-10-18 | Discharge: 2023-10-18 | Disposition: A | Discharge status: Home / Self Care | Payer: Managed Care, Other (non HMO) | Source: Ambulatory Visit | Attending: Family | Admitting: Family

## 2023-10-18 DIAGNOSIS — M810 Age-related osteoporosis without current pathological fracture: Secondary | ICD-10-CM | POA: Insufficient documentation

## 2023-10-19 ENCOUNTER — Encounter: Payer: Self-pay | Admitting: Obstetrics and Gynecology

## 2023-10-19 ENCOUNTER — Ambulatory Visit (INDEPENDENT_AMBULATORY_CARE_PROVIDER_SITE_OTHER): Payer: Managed Care, Other (non HMO) | Admitting: Obstetrics and Gynecology

## 2023-10-19 VITALS — BP 163/92 | HR 65 | Ht 66.5 in | Wt 167.2 lb

## 2023-10-19 DIAGNOSIS — N870 Mild cervical dysplasia: Secondary | ICD-10-CM

## 2023-10-19 DIAGNOSIS — B977 Papillomavirus as the cause of diseases classified elsewhere: Secondary | ICD-10-CM

## 2023-10-19 DIAGNOSIS — R87612 Low grade squamous intraepithelial lesion on cytologic smear of cervix (LGSIL): Secondary | ICD-10-CM

## 2023-10-19 NOTE — Progress Notes (Signed)
Referring Provider: Rennie Plowman  HPI:  [name] is a 57 y.o.  [id number]  who presents today for evaluation and management of abnormal cervical cytology.    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 She has a form of liver disease in which she may need a transplant in the future.  She is concerned regarding having a virus that is more aggressive in immunocompromised individuals.  ROS:  Pertinent items noted in HPI and remainder of comprehensive ROS otherwise negative.  OB History  Gravida Para Term Preterm AB Living  5 2 2   3 2   SAB IAB Ectopic Multiple Live Births  2 1     2     # Outcome Date GA Lbr Len/2nd Weight Sex Type Anes PTL Lv  5 Term 2001   8 lb 11 oz (3.941 kg) F CS-Unspec   LIV  4 Term 1992   6 lb 7 oz (2.92 kg) F CS-Unspec   LIV  3 IAB 1988          2 SAB           1 SAB             Obstetric Comments  1st Menstrual Cycle:  ?  1st Pregnancy:  21    Past Medical History:  Diagnosis Date   Asthma due to seasonal allergies    no inhaler   History of diabetes mellitus, type II    per pt resolved after bariatric surgery 2017   History of hypertension    per pt resolved after bariatric surgery 2017   History of kidney stones    History of obstructive sleep apnea    per pt hx osa w/ cpap,  retested after bariatric surgery 2017 result negative   History of skin cancer    nose, not melanoma   Hydronephrosis, right    Hyperlipidemia    MRSA carrier    NAFL (nonalcoholic fatty liver)    Osteoporosis    Primary biliary cirrhosis (HCC)    Primary biliary cirrhosis (HCC)    Renal calculi    per CT 04-18-2019 bilateral , non-obstructive   S/P bariatric surgery 08/03/2016   duodenal switch w/ hh repair   Ureteral calculus, right    Wears glasses     Past Surgical History:  Procedure Laterality Date   AUGMENTATION MAMMAPLASTY  Bilateral 07/2021   Implants and lift   AUGMENTATION MAMMAPLASTY Bilateral 07/05/2022   Dr Kizzie Bane, replaced implants   BARIATRIC SURGERY  08-03-2016    dr Smitty Cords @[facility] ,  [place]   LAPAROSCOPY DUODENAL SWITCH AND HIATAL HERNIA REPAIR   BRACHIOPLASTY     BREAST BIOPSY Right 2017   FIBROCYSTIC CHANGES WITH CALCIFICATIONS   CESAREAN SECTION  x2  last one 09-10-2000   COLONOSCOPY WITH PROPOFOL N/A 09/11/2019   Procedure: COLONOSCOPY WITH PROPOFOL;  Surgeon: Wyline Mood, MD;  Location: [facility];  Service: Gastroenterology;  Laterality: N/A;   CYSTOSCOPY WITH RETROGRADE PYELOGRAM, URETEROSCOPY AND STENT PLACEMENT Right 02-07-2012  @[facility]    CYSTOSCOPY WITH STENT PLACEMENT Right 09/10/2017   Procedure: CYSTOSCOPY WITH STENT PLACEMENT;  Surgeon: Riki Altes, MD;  Location: [facility] ORS;  Service: Urology;  Laterality: Right;   CYSTOSCOPY/URETEROSCOPY/HOLMIUM LASER/STENT PLACEMENT Right 04/24/2019   Procedure: CYSTOSCOPY/URETEROSCOPY/STENT PLACEMENT/ RIGHT RETROGRADE;  Surgeon: Jerilee Field, MD;  Location: [facility];  Service: Urology;  Laterality: Right;  DX LAPAROSCOPY/  EGD/  HIATAL HERNIA REPAIR CLOSURE OF INTERNAL MENSENTRY DEFECT  10-28-2017   @[facility] ,  [place]   EXTRACORPOREAL SHOCK WAVE LITHOTRIPSY N/A 01/30/2019   Procedure: EXTRACORPOREAL SHOCK WAVE LITHOTRIPSY (ESWL) LEFT/ POSSIBLE RIGHT;  Surgeon: Jerilee Field, MD;  Location: [facility] ORS;  Service: Urology;  Laterality: N/A;   EXTRACORPOREAL SHOCK WAVE LITHOTRIPSY Right 03/20/2019   Procedure: EXTRACORPOREAL SHOCK WAVE LITHOTRIPSY (ESWL);  Surgeon: Marcine Matar, MD;  Location: [facility];  Service: Urology;  Laterality: Right;   EXTRACORPOREAL SHOCK WAVE LITHOTRIPSY Left 10/30/2021   Procedure: EXTRACORPOREAL SHOCK WAVE LITHOTRIPSY (ESWL);  Surgeon: Crist Fat, MD;  Location: [facility];  Service: Urology;  Laterality: Left;   LAPAROSCOPIC CHOLECYSTECTOMY  10-15-2016   @[facility]  ,  [place]    LIPOSUCTION     PANNICULECTOMY     PLACEMENT OF BREAST IMPLANTS Bilateral    TONSILLECTOMY AND ADENOIDECTOMY  child   URETEROSCOPY WITH HOLMIUM LASER LITHOTRIPSY Right 09/10/2017   Procedure: URETEROSCOPY WITH HOLMIUM LASER LITHOTRIPSY;  Surgeon: Riki Altes, MD;  Location: [facility] ORS;  Service: Urology;  Laterality: Right;    SOCIAL HISTORY:  Social History   Substance and Sexual Activity  Alcohol Use No   Alcohol/week: 0.0 standard drinks of alcohol    Social History   Substance and Sexual Activity  Drug Use Never     Family History  Problem Relation Age of Onset   Seizures Mother    Alcohol abuse Mother    Hyperlipidemia Mother    Hypertension Mother    Lymphoma Mother        primary site : brain   Leukemia Mother    Parkinsonism Father    Hyperlipidemia Father    Hypertension Father    Parkinson's disease Father    Drug abuse Sister    Cancer Maternal Grandmother        breast cancer   Breast cancer Maternal Grandmother 35   Liver cancer Paternal Grandfather    Kidney cancer Neg Hx    Prostate cancer Neg Hx     ALLERGIES:  Ivp dye [iodinated contrast media], Latex, Tolmetin, Gallium nitrate, Insulin detemir, Nsaids, and Gadolinium derivatives  She has a current medication list which includes the following prescription(s): metronidazole, sertraline, ursodiol, valacyclovir, amlodipine, apixaban, calcium citrate, calcium, cholecalciferol, metoprolol succinate, multiple vitamins-minerals, pediatric multivitamins-iron, prevident 5000 booster plus, saw palmetto, vitamin a, vitamin e, and vitamin k.  Physical Exam: -Vitals:  BP (!) 163/92   Pulse 65   Ht 5' 6.5" (1.689 m)   Wt 167 lb 3.2 oz (75.8 kg)   LMP 07/11/2016 (Exact Date)   BMI 26.58 kg/m   PROCEDURE: Colposcopy performed with 4% acetic acid after verbal consent obtained                           -Aceto-white Lesions Location(s): See above              -Biopsy performed at 6 o'clock                -ECC indicated and performed: Yes.       -Biopsy sites made hemostatic with pressure and Monsel's solution   -Satisfactory colposcopy: Yes.      -Evidence of Invasive cervical CA :  NO  ASSESSMENT:  [name] is a 57 y.o. [id number] here for  1. LGSIL on Pap smear of cervix   2. HPV in female   3. CIN I (  cervical intraepithelial neoplasia I)   .  PLAN: 1.  I discussed the grading system of pap smears and HPV high risk viral types.  We will discuss management after colpo results return.  No orders of the defined types were placed in this encounter.          F/U  Return in about 2 weeks (around 11/02/2023) for Colpo f/u.  Brennan Bailey ,MD 10/19/2023,3:03 PM

## 2023-10-19 NOTE — Progress Notes (Signed)
Patient presents today for a colposcopy. She recently had an abnormal pap smear resulting in lgsil/hpv+ . No further concerns today.

## 2023-10-22 LAB — ANATOMIC PATHOLOGY REPORT

## 2023-10-26 ENCOUNTER — Encounter: Payer: Self-pay | Admitting: Cardiology

## 2023-10-26 ENCOUNTER — Ambulatory Visit: Payer: Managed Care, Other (non HMO) | Attending: Cardiology | Admitting: Cardiology

## 2023-10-26 VITALS — BP 154/98 | HR 60 | Ht 66.5 in | Wt 168.8 lb

## 2023-10-26 DIAGNOSIS — E876 Hypokalemia: Secondary | ICD-10-CM

## 2023-10-26 DIAGNOSIS — I159 Secondary hypertension, unspecified: Secondary | ICD-10-CM | POA: Diagnosis not present

## 2023-10-26 DIAGNOSIS — I480 Paroxysmal atrial fibrillation: Secondary | ICD-10-CM

## 2023-10-26 MED ORDER — METOPROLOL SUCCINATE ER 25 MG PO TB24: 12.5000 mg | ORAL_TABLET | ORAL | Status: DC | PRN

## 2023-10-26 MED ORDER — AMLODIPINE BESYLATE 5 MG PO TABS: 5.0000 mg | ORAL_TABLET | Freq: Every day | ORAL | Status: DC

## 2023-10-26 MED ORDER — APIXABAN 5 MG PO TABS: 5.0000 mg | ORAL_TABLET | Freq: Two times a day (BID) | ORAL | Status: DC

## 2023-10-26 NOTE — Patient Instructions (Signed)
Medication Instructions:   RESTART:  amLODipine (NORVASC) 5 MG tablet - Take 1 tablet (5 mg total) by mouth daily  apixaban (ELIQUIS) 5 MG TABS tablet - Take 1 tablet (5 mg total) by mouth 2 (two) times daily  HOLD:  metoprolol succinate (TOPROL-XL) 25 MG 24 hr tablet  *If you need a refill on your cardiac medications before your next appointment, please call your pharmacy*  Lab Work: - None ordered  Testing/Procedures: - None ordered  Follow-Up: At [facility], you and your health needs are our priority.  As part of our continuing mission to provide you with exceptional heart care, we have created designated Provider Care Teams.  These Care Teams include your primary Cardiologist (physician) and Advanced Practice Providers (APPs -  Physician Assistants and Nurse Practitioners) who all work together to provide you with the care you need, when you need it.  Your next appointment:   8 week(s)  Provider:   Charlsie Quest, NP

## 2023-10-26 NOTE — Progress Notes (Signed)
Cardiology Office Note:  .   Date:  10/26/2023  ID:  [name], DOB [birth year], MRN [id number] PCP: Allegra Grana, FNP  [facility] Providers Cardiologist:  None    History of Present Illness: .   [name] is a 57 y.o. female with a past medical history of type 2 diabetes, primary biliary cirrhosis, GERD, status post bariatric surgery with fat-soluble vitamin deficiency, hypertension, new onset atrial fibrillation, who is here today for hospital follow-up.   She presented to the [facility] emergency department via EMS from work with complaints of dizziness and palpitations.  On rhythm strip from EMS she was noted to be in atrial fibrillation with RVR.  She states she had palpitations that started while at work with occasional episodes in the past.  She was found to be: Intermittently and out of atrial fibrillation with rates as high as 160s per EMS.  She was not given any medications en route to the emergency department.  She states that she feels like her chest is pounding when it happens she feels short of breath and that she may pass out. Felt as if she was having tunnel vision and had to lay her head down at her workstation.  States that prior to the episode starting she did have an energy drink which is not abnormal for her but denied any energy shots.  She denies any illicit drug use, alcohol use or other stimulants.  She does not have a history of arrhythmia.  She has never seen cardiology in the past and has no significant medical history of cardiac problems.Initial vital signs: Blood pressure 215/109, pulse of 77, respirations 19, temperature 98.1. Pertinent labs: Potassium 2.9, alkaline phosphatase 154, D-dimer 0.65, TSH 0.908, free T41.08, high-sensitivity troponin 11 x 2. Imaging: Chest x-ray with no acute cardiopulmonary abnormality; VQ lung scan showed normal perfusion exam with no evidence of acute pulmonary embolism. Medications administered in the  emergency department potassium chloride 10 mill equivalents IVPB, potassium chloride 40 mEq oral, Lovenox 70 mg subcu, diltiazem 10 mg IV.  Echocardiogram was completed which revealed an LVEF 55 to 60%, no RWMA, G1 DD, and no valvular abnormalities.  She was started on apixaban 5 mg twice daily for CHA2DS2-VASc score of at least 3 for stroke prophylaxis.  She was also continued on metoprolol succinate 12.5 mg daily but dosing was limited secondary to baseline bradycardia.  It was also recommended she consider Apple Watch to monitor for atrial fibrillation.  She had converted to sinus rhythm after receiving IV diltiazem in the emergency department.  She was considered stable and was discharged home on 09/30/2023.   She returns to clinic today stating that she has been doing well from a cardiac perspective.  She denies any palpitations, chest pain, shortness of breath or peripheral edema.  She has had several procedures with recent colonoscopy and endoscopy that was just finished and has an upcoming procedure to be scheduled with plastics and her GYN.  Blood pressure has been elevated for the last several days and she does have complaints of headache.  Upon further discussion she has not been taking amlodipine, apixaban, or Toprol-XL.  She stated that when she read the pharmacy pamphlet of the Toprol-XL it stated that she should not be taking with liver issues.  She had several procedures to be done so she had not taken the apixaban.  And is likely related her headache to elevated blood pressure as she has not been taking the amlodipine  or the Toprol-XL.  She says she has been back at work and has not had any further episodes of elevated heart rates.  Denies any recent hospitalizations or visits to the emergency department.   ROS: 10 point review of systems were reviewed and considered negative with exception of what is been listed in HPI  Studies Reviewed: Marland Kitchen   EKG Interpretation Date/Time:  Tuesday October 26 2023 08:22:17 EST Ventricular Rate:  60 PR Interval:  148 QRS Duration:  90 QT Interval:  424 QTC Calculation: 424 R Axis:   -12  Text Interpretation: Normal sinus rhythm Possible Left atrial enlargement Left ventricular hypertrophy ( R in aVL , Cornell product ) When compared with ECG of 29-Sep-2023 04:36, PREVIOUS ECG IS PRESENT Confirmed by Charlsie Quest ([id number]) on 10/26/2023 8:31:30 AM    2D echo 09/30/2023 1. Left ventricular ejection fraction, by estimation, is 55 to 60%. The  left ventricle has normal function. The left ventricle has no regional  wall motion abnormalities. Left ventricular diastolic parameters are  consistent with Grade I diastolic  dysfunction (impaired relaxation).   2. Right ventricular systolic function is normal. The right ventricular  size is normal. There is normal pulmonary artery systolic pressure. The  estimated right ventricular systolic pressure is 18.4 mmHg.   3. The mitral valve is normal in structure. No evidence of mitral valve  regurgitation. No evidence of mitral stenosis.   4. The aortic valve is tricuspid. Aortic valve regurgitation is not  visualized. Aortic valve sclerosis is present, with no evidence of aortic  valve stenosis.   5. The inferior vena cava is normal in size with greater than 50%  respiratory variability, suggesting right atrial pressure of 3 mmHg.   Risk Assessment/Calculations:    CHA2DS2-VASc Score = 3   This indicates a 3.2% annual risk of stroke. The patient's score is based upon: CHF History: 0 HTN History: 1 Diabetes History: 1 Stroke History: 0 Vascular Disease History: 0 Age Score: 0 Gender Score: 1        Physical Exam:   VS:  BP (!) 154/98   Pulse 60   Ht 5' 6.5" (1.689 m)   Wt 168 lb 12.8 oz (76.6 kg)   LMP 07/11/2016 (Exact Date)   SpO2 98%   BMI 26.84 kg/m    Wt Readings from Last 3 Encounters:  10/26/23 168 lb 12.8 oz (76.6 kg)  10/19/23 167 lb 3.2 oz (75.8 kg)  10/01/23 168 lb (76.2  kg)    GEN: Well nourished, well developed in no acute distress NECK: No JVD; No carotid bruits CARDIAC: RRR, no murmurs, rubs, gallops RESPIRATORY:  Clear to auscultation without rales, wheezing or rhonchi  ABDOMEN: Soft, non-tender, non-distended EXTREMITIES:  No edema; No deformity   ASSESSMENT AND PLAN: .   New onset atrial fibrillation that required recent hospitalization at [facility].  She was treated with diltiazem 10 mg IV and converted to sinus rhythm.  EKG today reveals sinus rhythm with a rate of 60, left axis deviation, LVH, and left atrial enlargement.  Unfortunately she had not taken the Toprol-XL the or the apixaban for CHA2DS2-VASc score of 3 for stroke prophylaxis.  She stated that she had recently had procedures and was scheduled to start and stop the medication.  She has been advised that the medication is to be stopped only by cardiology and not by routine providers as there is a risk of starting and stopping oral anticoagulants frequently.  For CHA2DS2-VASc score of 3 we  did discuss her apixaban was for stroke prophylaxis and she states that she will start the medication today.  She has been encouraged to continue to avoid energy drinks.  Discussed triggers of atrial fibrillation and fatigue, stress, anemia, and caffeine.  She has been advised last for any other procedures that she has never need to send surgical clearance to the office.  She has also been encouraged to continue to monitor her heart rate with either bed or an Apple Watch.  We are changing her Toprol-XL today to as needed for palpitations or elevated heart rate due to her baseline heart rate of 60 and occasional sinus bradycardia.  Hypertension with blood pressure today 150/98.  She has been encouraged to start taking the amlodipine 5 mg daily.  She has also been advised to monitor blood pressure 1 to 2 hours postmedication administration at home as well to monitor the impact of her blood pressure  medication.  Hypokalemia with a serum potassium of 2.9 when she arrived to the hospital.  Electrolytes were repleted.  Repeat potassium level of 4.2 on labs post hospital discharge.  Patient works at [employer] and will periodically check potassium levels.       Dispo: Patient to return to clinic to see MD/APP in 8 weeks or sooner if needed for reevaluation of symptoms  Signed, Hudsyn Barich, NP

## 2023-11-09 ENCOUNTER — Encounter: Payer: Self-pay | Admitting: Obstetrics and Gynecology

## 2023-11-09 ENCOUNTER — Telehealth (INDEPENDENT_AMBULATORY_CARE_PROVIDER_SITE_OTHER): Payer: Managed Care, Other (non HMO) | Admitting: Obstetrics and Gynecology

## 2023-11-09 DIAGNOSIS — N870 Mild cervical dysplasia: Secondary | ICD-10-CM | POA: Diagnosis not present

## 2023-11-09 NOTE — Progress Notes (Signed)
 Virtual Visit via Video Note  I connected with [name] on 11/09/23 at  7:55 AM EST by video and verified that I was speaking with the correct person using two identifiers.    [name] is a 57 y.o. [id number] who LMP was Patient's last menstrual period was 07/11/2016 (exact date). I discussed the limitations, risks, security and privacy concerns of performing an evaluation and management service by video and the availability of in person appointments. I also discussed with the patient that there may be a patient responsible charge related to this service. The patient expressed understanding and agreed to proceed.  Location of patient:  home  Patient gave explicit verbal consent for video visit:  YES  Location of provider:  [facility] office  Persons other than physician and patient involved in provider conference:  None   Subjective:   History of Present Illness:    Has a history of CIN-1.  She recently had a LGSIL Pap smear with positive HPV.  She underwent colposcopically directed biopsies.  Hx: The following portions of the patient's history were reviewed and updated as appropriate:             She  has a past medical history of Asthma due to seasonal allergies, History of diabetes mellitus, type II, History of hypertension, History of kidney stones, History of obstructive sleep apnea, History of skin cancer, Hydronephrosis, right, Hyperlipidemia, MRSA carrier, NAFL (nonalcoholic fatty liver), Osteoporosis, Primary biliary cirrhosis (HCC), Primary biliary cirrhosis (HCC), Renal calculi, S/P bariatric surgery (08/03/2016), Ureteral calculus, right, and Wears glasses. She does not have any pertinent problems on file. She  has a past surgical history that includes Cesarean section (x2  last one 09-10-2000); Bariatric Surgery (08-03-2016    dr wolm @[facility] ,  [place]); Ureteroscopy with holmium laser lithotripsy (Right, 09/10/2017); Cystoscopy with stent placement (Right,  09/10/2017); Extracorporeal shock wave lithotripsy (N/A, 01/30/2019); Extracorporeal shock wave lithotripsy (Right, 03/20/2019); Laparoscopic cholecystectomy (10-15-2016   @[facility]  ,  [place]); DX LAPAROSCOPY/  EGD/  HIATAL HERNIA REPAIR CLOSURE OF INTERNAL MENSENTRY DEFECT (10-28-2017   @[facility] ,  [place]); Cystoscopy with retrograde pyelogram, ureteroscopy and stent placement (Right, 02-07-2012  @[facility] ); Tonsillectomy and adenoidectomy (child); Cystoscopy/ureteroscopy/holmium laser/stent placement (Right, 04/24/2019); Colonoscopy with propofol  (N/A, 09/11/2019); Breast biopsy (Right, 2017); Placement of breast implants (Bilateral); Augmentation mammaplasty (Bilateral, 07/2021); Brachioplasty; Panniculectomy; Liposuction; Extracorporeal shock wave lithotripsy (Left, 10/30/2021); and Augmentation mammaplasty (Bilateral, 07/05/2022). Her family history includes Alcohol abuse in her mother; Breast cancer (age of onset: 59) in her maternal grandmother; Cancer in her maternal grandmother; Drug abuse in her sister; Hyperlipidemia in her father and mother; Hypertension in her father and mother; Leukemia in her mother; Liver cancer in her paternal grandfather; Lymphoma in her mother; Parkinson's disease in her father; Parkinsonism in her father; Seizures in her mother. She  reports that she has never smoked. She has never used smokeless tobacco. She reports that she does not drink alcohol and does not use drugs. She has a current medication list which includes the following prescription(s): amlodipine , apixaban , calcium  citrate, calcium , cholecalciferol , metoprolol  succinate, metronidazole , multiple vitamins-minerals, pediatric multivitamins-iron , prevident 5000 booster plus, saw palmetto, sertraline , ursodiol , valacyclovir , vitamin a , vitamin e , and vitamin k . She is allergic to ivp dye [iodinated contrast media], latex, tolmetin, gallium nitrate, insulin  detemir, nsaids, and gadolinium derivatives.       Review of  Systems:  Review of Systems  Constitutional: Denied constitutional symptoms, night sweats, recent illness, fatigue, fever, insomnia and weight loss.  Eyes: Denied  eye symptoms, eye pain, photophobia, vision change and visual disturbance.  Ears/Nose/Throat/Neck: Denied ear, nose, throat or neck symptoms, hearing loss, nasal discharge, sinus congestion and sore throat.  Cardiovascular: Denied cardiovascular symptoms, arrhythmia, chest pain/pressure, edema, exercise intolerance, orthopnea and palpitations.  Respiratory: Denied pulmonary symptoms, asthma, pleuritic pain, productive sputum, cough, dyspnea and wheezing.  Gastrointestinal: Denied, gastro-esophageal reflux, melena, nausea and vomiting.  Genitourinary: See HPI for additional information.  Musculoskeletal: Denied musculoskeletal symptoms, stiffness, swelling, muscle weakness and myalgia.  Dermatologic: Denied dermatology symptoms, rash and scar.  Neurologic: Denied neurology symptoms, dizziness, headache, neck pain and syncope.  Psychiatric: Denied psychiatric symptoms, anxiety and depression.  Endocrine: Denied endocrine symptoms including hot flashes and night sweats.   Meds:   Current Outpatient Medications on File Prior to Visit  Medication Sig Dispense Refill   amLODipine  (NORVASC ) 5 MG tablet Take 1 tablet (5 mg total) by mouth daily.     apixaban  (ELIQUIS ) 5 MG TABS tablet Take 1 tablet (5 mg total) by mouth 2 (two) times daily.     calcium  citrate (CALCITRATE - DOSED IN MG ELEMENTAL CALCIUM ) 950 (200 Ca) MG tablet Take 200 mg of elemental calcium  by mouth daily.     CALCIUM  PO Take by mouth.     Cholecalciferol  (VITAMIN D3 PO) Take by mouth daily.     metoprolol  succinate (TOPROL -XL) 25 MG 24 hr tablet Take 0.5 tablets (12.5 mg total) by mouth as needed.     metroNIDAZOLE  (FLAGYL ) 500 MG tablet Take 1 tablet (500 mg total) by mouth 2 (two) times daily. 14 tablet 0   Multiple Vitamins-Minerals (BARIATRIC MULTIVITAMINS/IRON   PO) Take by mouth.     Pediatric Multivitamins-Iron  (FRUITY CHEWS/IRON  PO) Take by mouth.     PREVIDENT 5000 BOOSTER PLUS 1.1 % PSTE Place onto teeth as directed.     saw palmetto 80 MG capsule Take 80 mg by mouth 2 (two) times daily.     sertraline  (ZOLOFT ) 25 MG tablet Take 25-50 mg by mouth daily as needed (itching).     ursodiol  (ACTIGALL ) 250 MG tablet Take 250 mg by mouth in the morning, at noon, in the evening, and at bedtime.     valACYclovir  (VALTREX ) 1000 MG tablet 1 g twice daily for 7 to 10 days; extend duration if lesion has not healed completely after 10 days 60 tablet 0   VITAMIN A  PO Take by mouth daily.     vitamin E  180 MG (400 UNITS) capsule Take 400 Units by mouth daily.     vitamin k  100 MCG tablet Take 100 mcg by mouth daily.     No current facility-administered medications on file prior to visit.    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   Headache 07/05/2020   Osteomalacia 12/27/2019   History of weight loss 11/28/2019   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   Localized osteoporosis without current  pathological fracture 06/26/2019   Hypocalcemia 06/26/2019   Secondary hyperparathyroidism (HCC) 06/26/2019   Multinodular goiter 06/26/2019   Dysphagia 05/05/2019   Osteoporosis 05/03/2019   Memory loss 02/13/2019   Elevated liver function tests  10/13/2017   Epigastric abdominal pain 10/13/2017   History of kidney stones 10/13/2017   Hypertension 10/13/2017   Status post bariatric surgery 09/03/2017   Right ureteral calculus 08/31/2017   Hydronephrosis with urinary obstruction due to ureteral calculus 08/31/2017   Nephrolithiasis 08/31/2017   Screen for STD (sexually transmitted disease) 07/26/2017   Sleep apnea 10/06/2016   Candidal intertrigo 09/21/2016   H/O gastric bypass 09/21/2016   Hiatal hernia 04/21/2016   Elevated liver enzymes 09/04/2014   Vitamin D  deficiency 09/03/2014   Routine general medical examination at a health care facility 01/11/2014   T2DM (type 2 diabetes mellitus) (HCC) 01/11/2014     1. CIN I (cervical intraepithelial neoplasia I)       Plan:            1.  We have discussed options for persistent CIN-1.  These include continued expectant management with Pap smear and possible colposcopically directed biopsy follow-up versus LEEP.  Risks and benefits of each have been discussed.  Follow-up after each procedure has also been discussed.  All of her questions were answered. At this time she has chosen to have a follow-up Pap smear in 1 year.  I have stressed the importance of this Pap and with adequate follow-up as needed. Orders No orders of the defined types were placed in this encounter.   No orders of the defined types were placed in this encounter.     F/U  No follow-ups on file.  Alm DOROTHA Sar, M.D. 11/09/2023 8:09 AM

## 2023-12-15 ENCOUNTER — Other Ambulatory Visit: Payer: Self-pay | Admitting: Family

## 2023-12-16 LAB — BASIC METABOLIC PANEL
BUN/Creatinine Ratio: 19 (ref 9–23)
BUN: 13 mg/dL (ref 6–24)
CO2: 23 mmol/L (ref 20–29)
Calcium: 8.7 mg/dL (ref 8.7–10.2)
Chloride: 110 mmol/L — ABNORMAL HIGH (ref 96–106)
Creatinine, Ser: 0.68 mg/dL (ref 0.57–1.00)
Glucose: 85 mg/dL (ref 70–99)
Potassium: 4 mmol/L (ref 3.5–5.2)
Sodium: 145 mmol/L — ABNORMAL HIGH (ref 134–144)
eGFR: 102 mL/min/{1.73_m2} (ref >59)

## 2023-12-17 ENCOUNTER — Encounter: Payer: Self-pay | Admitting: Family

## 2023-12-20 NOTE — Progress Notes (Signed)
Cardiology Office Note:  .   Date:  12/21/2023  ID:  [name], DOB 1966-04-12, MRN [id number] PCP: Allegra Grana, FNP  [facility] Providers Cardiologist:  None    History of Present Illness: .   [name] is a 58 y.o. female with past medical history of type 2 diabetes, primary biliary cirrhosis, GERD, status post bariatric surgery with fat-soluble vitamin deficiency, hypertension, new onset atrial fibrillation, who is here today for follow-up of her atrial fibrillation.   She presented to the [facility] emergency department via EMS from work with complaints of dizziness and palpitations.  On rhythm strip from EMS she was noted to be in atrial fibrillation with RVR.  She states she had palpitations that started while at work with occasional episodes in the past.  She was found to be: Intermittently and out of atrial fibrillation with rates as high as 160s per EMS.  She was not given any medications en route to the emergency department.  She states that she feels like her chest is pounding when it happens she feels short of breath and that she may pass out. Felt as if she was having tunnel vision and had to lay her head down at her workstation.  States that prior to the episode starting she did have an energy drink which is not abnormal for her but denied any energy shots.  She denies any illicit drug use, alcohol use or other stimulants.  She does not have a history of arrhythmia.  She has never seen cardiology in the past and has no significant medical history of cardiac problems.Initial vital signs: Blood pressure 215/109, pulse of 77, respirations 19, temperature 98.1. Pertinent labs: Potassium 2.9, alkaline phosphatase 154, D-dimer 0.65, TSH 0.908, free T41.08, high-sensitivity troponin 11 x 2. Imaging: Chest x-ray with no acute cardiopulmonary abnormality; VQ lung scan showed normal perfusion exam with no evidence of acute pulmonary embolism. Medications administered  in the emergency department potassium chloride 10 mill equivalents IVPB, potassium chloride 40 mEq oral, Lovenox 70 mg subcu, diltiazem 10 mg IV.  Echocardiogram was completed which revealed an LVEF 55 to 60%, no RWMA, G1 DD, and no valvular abnormalities.  She was started on apixaban 5 mg twice daily for CHA2DS2-VASc score of at least 3 for stroke prophylaxis.  She was also continued on metoprolol succinate 12.5 mg daily but dosing was limited secondary to baseline bradycardia.  It was also recommended she consider Apple Watch to monitor for atrial fibrillation.  She had converted to sinus rhythm after receiving IV diltiazem in the emergency department.  She was considered stable and was discharged home on 09/30/2023.   She was last seen in clinic 10/26/2023 where she doing well from the cardiac perspective.  She had an upcoming procedure scheduled with plastics and her GYN.  Blood pressure been elevated for the last several days and she did have a complaint of headache.  She has been compliant with her other medications.  Toprol-XL was changed to as needed and she was started on amlodipine 5 mg daily.  She had also been advised to continue to keep a check on her potassium levels.  No further testing was ordered at that time.  She returns to clinic today stating that overall she has been doing well.  Unfortunately she has not been taking her apixaban since the first 30 days that she was on the medication when she had a coupon card as she went to the pharmacy to fill them  and was advised that it would be $150 which she was unable to afford at the time.  Blood pressures have been elevated and she continues to complain of headache.  She does continue to work nights and has started working with a Systems analyst decreased caffeine and is trying to eat better.  But continues to have elevated pressures.  Stated that she recently had labs done and potassium was back at 4.  Has only taken her Toprol-XL 1 time for  palpitations.  Continues to have an open wound to her abdomen that is oozing from her previous tummy tuck where it is thought that her body is rejecting the internal sutures.  Denies any hospitalizations or visits to the emergency department.  ROS: 10 point review of systems has been reviewed and considered negative with exception was been listed in HPI  Studies Reviewed: Marland Kitchen   EKG Interpretation Date/Time:  Tuesday December 21 2023 08:06:59 EST Ventricular Rate:  55 PR Interval:  152 QRS Duration:  96 QT Interval:  448 QTC Calculation: 428 R Axis:   -16  Text Interpretation: Sinus bradycardia Moderate voltage criteria for LVH, may be normal variant ( R in aVL , Cornell product ) Septal infarct , age undetermined When compared with ECG of 26-Oct-2023 08:22, Confirmed by Charlsie Quest ([id number]) on 12/21/2023 8:12:56 AM    2D echo 09/30/2023 1. Left ventricular ejection fraction, by estimation, is 55 to 60%. The  left ventricle has normal function. The left ventricle has no regional  wall motion abnormalities. Left ventricular diastolic parameters are  consistent with Grade I diastolic  dysfunction (impaired relaxation).   2. Right ventricular systolic function is normal. The right ventricular  size is normal. There is normal pulmonary artery systolic pressure. The  estimated right ventricular systolic pressure is 18.4 mmHg.   3. The mitral valve is normal in structure. No evidence of mitral valve  regurgitation. No evidence of mitral stenosis.   4. The aortic valve is tricuspid. Aortic valve regurgitation is not  visualized. Aortic valve sclerosis is present, with no evidence of aortic  valve stenosis.   5. The inferior vena cava is normal in size with greater than 50%  respiratory variability, suggesting right atrial pressure of 3 mmHg.  Risk Assessment/Calculations:    CHA2DS2-VASc Score = 3   This indicates a 3.2% annual risk of stroke. The patient's score is based upon: CHF  History: 0 HTN History: 1 Diabetes History: 1 Stroke History: 0 Vascular Disease History: 0 Age Score: 0 Gender Score: 1        Physical Exam:   VS:  BP (!) 154/92   Pulse (!) 55   Ht 5' 6.5" (1.689 m)   Wt 171 lb 9.6 oz (77.8 kg)   LMP 07/11/2016 (Exact Date)   SpO2 96%   BMI 27.28 kg/m    Wt Readings from Last 3 Encounters:  12/21/23 171 lb 9.6 oz (77.8 kg)  10/26/23 168 lb 12.8 oz (76.6 kg)  10/19/23 167 lb 3.2 oz (75.8 kg)    GEN: Well nourished, well developed in no acute distress NECK: No JVD; No carotid bruits CARDIAC: RRR, bradycardic, no murmurs, rubs, gallops RESPIRATORY:  Clear to auscultation without rales, wheezing or rhonchi  ABDOMEN: Soft, non-tender, non-distended EXTREMITIES:  No edema; No deformity   ASSESSMENT AND PLAN: .   Paroxysmal atrial fibrillation with prior hospitalization for RVR treated with diltiazem and converted to sinus rhythm.  EKG today reveals sinus bradycardia with a rate of 55,  LVH, left axis deviation.  Unfortunately she is only taken metoprolol as needed for palpitations and is only needed 1 dose as of late.  She states that she has not been on her apixaban for CHA2DS2-VASc of at least 3 for stroke prophylaxis due to cost.  With commercial insurance she was advised that she should be able to use a coupon card for reduced co-pay.  That is being given to her today new prescription for apixaban is being sent and.  She has been encouraged to continue to monitor her heart rate with her Apple Watch or another brand.  Essential hypertension with blood pressure 154/92 with continued complaints of headache stating that she feels as though her blood pressure slightly been elevated.  She states that she has been taking her amlodipine 5 mg daily that will be increased to 5 mg twice daily today.  She has been encouraged to continue to monitor pressures upon chart review at other appointments she has had a blood pressure continues to be elevated as  well.  Hypokalemia with a serum potassium of 4 on her last labs.  She has been instructed to continue to periodically check her potassium level.       Dispo: Patient return to clinic to see MD/APP in 3 months or sooner if needed for evaluation of symptoms.  Signed, Amalea Ottey, NP

## 2023-12-21 ENCOUNTER — Encounter: Payer: Self-pay | Admitting: Cardiology

## 2023-12-21 ENCOUNTER — Ambulatory Visit: Payer: Managed Care, Other (non HMO) | Attending: Cardiology | Admitting: Cardiology

## 2023-12-21 VITALS — BP 154/92 | HR 55 | Ht 66.5 in | Wt 171.6 lb

## 2023-12-21 DIAGNOSIS — I480 Paroxysmal atrial fibrillation: Secondary | ICD-10-CM

## 2023-12-21 DIAGNOSIS — E876 Hypokalemia: Secondary | ICD-10-CM

## 2023-12-21 DIAGNOSIS — I159 Secondary hypertension, unspecified: Secondary | ICD-10-CM

## 2023-12-21 MED ORDER — AMLODIPINE BESYLATE 5 MG PO TABS: 5.0000 mg | ORAL_TABLET | Freq: Two times a day (BID) | ORAL | 11 refills | Status: DC

## 2023-12-21 MED ORDER — APIXABAN 5 MG PO TABS: 5.0000 mg | ORAL_TABLET | Freq: Two times a day (BID) | ORAL | 11 refills | Status: DC

## 2023-12-21 NOTE — Patient Instructions (Signed)
Medication Instructions:  INCREASE Amlodipine to 5 mg twice daily  *If you need a refill on your cardiac medications before your next appointment, please call your pharmacy*   Lab Work: None ordered If you have labs (blood work) drawn today and your tests are completely normal, you will receive your results only by: MyChart Message (if you have MyChart) OR A paper copy in the mail If you have any lab test that is abnormal or we need to change your treatment, we will call you to review the results.   Testing/Procedures: None ordered   Follow-Up: At [facility], you and your health needs are our priority.  As part of our continuing mission to provide you with exceptional heart care, we have created designated Provider Care Teams.  These Care Teams include your primary Cardiologist (physician) and Advanced Practice Providers (APPs -  Physician Assistants and Nurse Practitioners) who all work together to provide you with the care you need, when you need it.  We recommend signing up for the patient portal called "MyChart".  Sign up information is provided on this After Visit Summary.  MyChart is used to connect with patients for Virtual Visits (Telemedicine).  Patients are able to view lab/test results, encounter notes, upcoming appointments, etc.  Non-urgent messages can be sent to your provider as well.   To learn more about what you can do with MyChart, go to [email or web address].    Your next appointment:   3 month(s)  Provider:   Charlsie Quest, NP

## 2023-12-29 ENCOUNTER — Encounter: Payer: Self-pay | Admitting: Family

## 2024-01-05 ENCOUNTER — Telehealth: Payer: Self-pay | Admitting: Family

## 2024-01-05 ENCOUNTER — Encounter: Payer: Self-pay | Admitting: Family

## 2024-01-05 ENCOUNTER — Ambulatory Visit: Payer: Managed Care, Other (non HMO) | Admitting: Family

## 2024-01-05 VITALS — BP 130/82 | HR 74 | Temp 98.1°F | Resp 17 | Ht 66.5 in | Wt 170.8 lb

## 2024-01-05 DIAGNOSIS — R1031 Right lower quadrant pain: Secondary | ICD-10-CM

## 2024-01-05 DIAGNOSIS — S31109S Unspecified open wound of abdominal wall, unspecified quadrant without penetration into peritoneal cavity, sequela: Secondary | ICD-10-CM

## 2024-01-05 DIAGNOSIS — R300 Dysuria: Secondary | ICD-10-CM

## 2024-01-05 LAB — POC URINALSYSI DIPSTICK (AUTOMATED)
Bilirubin, UA: NEGATIVE
Blood, UA: NEGATIVE
Glucose, UA: NEGATIVE
Ketones, UA: NEGATIVE
Nitrite, UA: POSITIVE
Protein, UA: POSITIVE — AB
Spec Grav, UA: 1.025 (ref 1.010–1.025)
Urobilinogen, UA: 0.2 U/dL
pH, UA: 5.5 (ref 5.0–8.0)

## 2024-01-05 NOTE — Progress Notes (Signed)
 Assessment & Plan:  Open wound of abdominal wall, sequela Assessment & Plan: Chronic.  No systemic features.   Requesting office visit notes from Dr. Claudina Lick plastic surgeon as symptom persists after sutures removed in office in 2024.   RLQ swelling has improved today compared to prior description of tennis ball.  Non fluctuant and question if subcutaneous edema is related to panniculectomy. Fluid collection seen on prior ct a/p 05/2023.   Pending repeat CT a/p, UA and urine culture.  She declines antibiotics today for persistent discharge from wound. Advised follow up with Dr Charmian Muff.   Orders: -     CT ABDOMEN PELVIS WO CONTRAST; Future  Dysuria -     POCT Urinalysis Dipstick (Automated) -     Urine Culture -     Urinalysis, Routine w reflex microscopic  Right lower quadrant abdominal pain -     CT ABDOMEN PELVIS WO CONTRAST; Future     Return precautions given.   Risks, benefits, and alternatives of the medications and treatment plan prescribed today were discussed, and patient expressed understanding.   Education regarding symptom management and diagnosis given to patient on AVS either electronically or printed.  No follow-ups on file.  Rennie Plowman, FNP  Subjective:    Patient ID: [name], female    DOB: [birth year], 58 y.o.   MRN: [id number]  CC: Jeanelle Dake is a 58 y.o. female who presents today for an acute visit.    HPI: Complains of right lower abdominal area of visible soft tissue swelling x 3 days ago Area  was painful and swollen ,  improved significantly today.  No discharge from area  Area had been the size of half 'tennis ball'  She started ibuprofen with improvement.   No fever, N, constipation   She complains of foul smell to urine , episodic, for weeks.   Denies dysuria, hematuria, vaginal discharge  H/o bl renal stone     She was seen last year for follow up by Dr [facility] Plastic [facility] ; she had  internal sutures removed in the office thought to be causing discharge from umbilicus.   She continues to have purulent discharge from incision above umbilicus. Per patient Dr Charmian Muff advised that this is chronic and doesn't warrant antibiotics.    H/o panniculectomy 2022  H/o c/s x 2  06/03/23 Ct a/p bilateral nephrolithiasis Postoperative changes in anterior abdominal wall soft tissues, with mild increase in size of fluid collections in the abdominal wall subcutaneous soft tissues. More well-defined fluid collection is seen in the anterior abdominal wall subcutaneous tissues in the midline above the umbilicus, currently measuring 3.8 x 2.0 cm. Another fluid collection in the right lower quadrant anterior abdominal wall subcutaneous tissues shows mild increase in size, currently measuring 14.5 x 10.3 cm, compared to 6.7 x 1.4 cm previously.   Allergies: Ivp dye [iodinated contrast media], Latex, Tolmetin, Gallium nitrate, Insulin detemir, Nsaids, and Gadolinium derivatives Current Outpatient Medications on File Prior to Visit  Medication Sig Dispense Refill   amLODipine (NORVASC) 5 MG tablet Take 1 tablet (5 mg total) by mouth in the morning and at bedtime. 60 tablet 11   apixaban (ELIQUIS) 5 MG TABS tablet Take 1 tablet (5 mg total) by mouth 2 (two) times daily. 60 tablet 11   Multiple Vitamins-Minerals (BARIATRIC MULTIVITAMINS/IRON PO) Take by mouth.     sertraline (ZOLOFT) 25 MG tablet Take 25-50 mg by mouth daily as needed (itching).  ursodiol (ACTIGALL) 250 MG tablet Take 250 mg by mouth in the morning, at noon, in the evening, and at bedtime.     calcium citrate (CALCITRATE - DOSED IN MG ELEMENTAL CALCIUM) 950 (200 Ca) MG tablet Take 200 mg of elemental calcium by mouth daily. (Patient not taking: Reported on 12/21/2023)     CALCIUM PO Take by mouth. (Patient not taking: Reported on 12/21/2023)     Cholecalciferol (VITAMIN D3 PO) Take by mouth daily. (Patient not taking: Reported  on 12/21/2023)     metoprolol succinate (TOPROL-XL) 25 MG 24 hr tablet Take 0.5 tablets (12.5 mg total) by mouth as needed. (Patient not taking: Reported on 01/05/2024)     Pediatric Multivitamins-Iron (FRUITY CHEWS/IRON PO) Take by mouth. (Patient not taking: Reported on 12/21/2023)     PREVIDENT 5000 BOOSTER PLUS 1.1 % PSTE Place onto teeth as directed. (Patient not taking: Reported on 12/21/2023)     saw palmetto 80 MG capsule Take 80 mg by mouth 2 (two) times daily. (Patient not taking: Reported on 12/21/2023)     valACYclovir (VALTREX) 1000 MG tablet 1 g twice daily for 7 to 10 days; extend duration if lesion has not healed completely after 10 days (Patient not taking: Reported on 01/05/2024) 60 tablet 0   VITAMIN A PO Take by mouth daily. (Patient not taking: Reported on 12/21/2023)     vitamin E 180 MG (400 UNITS) capsule Take 400 Units by mouth daily. (Patient not taking: Reported on 12/21/2023)     vitamin k 100 MCG tablet Take 100 mcg by mouth daily. (Patient not taking: Reported on 12/21/2023)     No current facility-administered medications on file prior to visit.    Review of Systems  Constitutional:  Negative for chills and fever.  Respiratory:  Negative for cough.   Cardiovascular:  Negative for chest pain and palpitations.  Gastrointestinal:  Negative for nausea and vomiting.  Genitourinary:  Negative for difficulty urinating, dysuria and vaginal discharge.      Objective:    BP 130/82   Pulse 74   Temp 98.1 F (36.7 C) (Oral)   Resp 17   Ht 5' 6.5" (1.689 m)   Wt 170 lb 12 oz (77.5 kg)   LMP 07/11/2016 (Exact Date)   SpO2 98%   BMI 27.15 kg/m   BP Readings from Last 3 Encounters:  01/05/24 130/82  12/21/23 (!) 154/92  10/26/23 (!) 154/98   Wt Readings from Last 3 Encounters:  01/05/24 170 lb 12 oz (77.5 kg)  12/21/23 171 lb 9.6 oz (77.8 kg)  10/26/23 168 lb 12.8 oz (76.6 kg)    Physical Exam Vitals reviewed.  Constitutional:      Appearance: Normal appearance.  She is well-developed.  Eyes:     Conjunctiva/sclera: Conjunctivae normal.  Cardiovascular:     Rate and Rhythm: Normal rate and regular rhythm.     Pulses: Normal pulses.     Heart sounds: Normal heart sounds.  Pulmonary:     Effort: Pulmonary effort is normal.     Breath sounds: Normal breath sounds. No wheezing, rhonchi or rales.  Abdominal:     General: Bowel sounds are normal. There is no distension.     Palpations: Abdomen is soft. Abdomen is not rigid. There is no fluid wave or mass.     Tenderness: There is no abdominal tenderness. There is no guarding or rebound.       Comments: Dried purulent discharge coming from open wound approx 1cm above  umbilicus.  RLQ soft tissue mild edema and hyperpigmentation.  No exquisite pain.  No purulent discharge, erythema, increased heat  Skin:    General: Skin is warm and dry.  Neurological:     Mental Status: She is alert.  Psychiatric:        Speech: Speech normal.        Behavior: Behavior normal.        Thought Content: Thought content normal.

## 2024-01-05 NOTE — Assessment & Plan Note (Signed)
 Chronic.  No systemic features.   Requesting office visit notes from Dr. Claudina Lick plastic surgeon as symptom persists after sutures removed in office in 2024.   RLQ swelling has improved today compared to prior description of tennis ball.  Non fluctuant and question if subcutaneous edema is related to panniculectomy. Fluid collection seen on prior ct a/p 05/2023.   Pending repeat CT a/p, UA and urine culture.  She declines antibiotics today for persistent discharge from wound. Advised follow up with Dr Charmian Muff.

## 2024-01-05 NOTE — Telephone Encounter (Signed)
 Call dr rhett high [facility] plastic surgery Phone: [phone]  We need records from the past 3 years since paniculectomy

## 2024-01-05 NOTE — Patient Instructions (Signed)
 I have ordered CT abdomen and pelvis.  I would strongly urge you to follow-up with [name] as I am concerned about the persistent wound discharge as well as swelling.  Please share CT results with Dr Charmian Muff for his recommendation.

## 2024-01-06 LAB — URINALYSIS, ROUTINE W REFLEX MICROSCOPIC
Bilirubin, UA: NEGATIVE
Glucose, UA: NEGATIVE
Nitrite, UA: POSITIVE — AB
RBC, UA: NEGATIVE
Specific Gravity, UA: 1.022 (ref 1.005–1.030)
Urobilinogen, Ur: 0.2 mg/dL (ref 0.2–1.0)
pH, UA: 5.5 (ref 5.0–7.5)

## 2024-01-06 LAB — MICROSCOPIC EXAMINATION
Casts: NONE SEEN /[LPF]
RBC, Urine: NONE SEEN /[HPF] (ref 0–2)
WBC, UA: >30 /[HPF] — AB (ref 0–5)

## 2024-01-08 LAB — URINE CULTURE

## 2024-01-09 ENCOUNTER — Other Ambulatory Visit: Payer: Self-pay | Admitting: Family

## 2024-01-09 ENCOUNTER — Encounter: Payer: Self-pay | Admitting: Family

## 2024-01-09 DIAGNOSIS — N3000 Acute cystitis without hematuria: Secondary | ICD-10-CM

## 2024-01-09 MED ORDER — SULFAMETHOXAZOLE-TRIMETHOPRIM 800-160 MG PO TABS: 1.0000 | ORAL_TABLET | Freq: Two times a day (BID) | ORAL | 0 refills | Status: DC

## 2024-01-10 ENCOUNTER — Other Ambulatory Visit: Payer: Self-pay | Admitting: Family

## 2024-01-10 ENCOUNTER — Telehealth: Payer: Self-pay

## 2024-01-10 ENCOUNTER — Encounter: Payer: Self-pay | Admitting: Family

## 2024-01-10 ENCOUNTER — Ambulatory Visit
Admission: RE | Admit: 2024-01-10 | Discharge: 2024-01-10 | Disposition: A | Discharge status: Home / Self Care | Payer: Managed Care, Other (non HMO) | Source: Ambulatory Visit | Attending: Family | Admitting: Family

## 2024-01-10 DIAGNOSIS — S31109S Unspecified open wound of abdominal wall, unspecified quadrant without penetration into peritoneal cavity, sequela: Secondary | ICD-10-CM

## 2024-01-10 DIAGNOSIS — R1031 Right lower quadrant pain: Secondary | ICD-10-CM | POA: Diagnosis present

## 2024-01-10 DIAGNOSIS — N3000 Acute cystitis without hematuria: Secondary | ICD-10-CM

## 2024-01-10 MED ORDER — SULFAMETHOXAZOLE-TRIMETHOPRIM 800-160 MG PO TABS: 1.0000 | ORAL_TABLET | Freq: Two times a day (BID) | ORAL | 0 refills | Status: AC

## 2024-01-10 NOTE — Telephone Encounter (Signed)
 Called Dr Rhett office [phone] spoke to Belenda Cruise and she stated that she would fax records from past 3 years to [place] fax number and my name was given

## 2024-01-10 NOTE — Telephone Encounter (Signed)
 Spoke to pt regarding message below see previous note

## 2024-01-10 NOTE — Telephone Encounter (Signed)
 Noted.

## 2024-01-10 NOTE — Telephone Encounter (Signed)
 Spoke to pt and she has an appt tomorrow on 01/11/2024 and will pick up records in the am

## 2024-01-10 NOTE — Telephone Encounter (Signed)
 Copied from CRM [id number]. Topic: Clinical - Lab/Test Results >> Jan 10, 2024 10:14 AM [place] J wrote: Reason for CRM: French Ana calling in from radiology. Needs to speak with provider's nurse regarding patient's most recent CT scan. Something abnormal was found.  Forwarding message above. Please advise.

## 2024-01-11 ENCOUNTER — Encounter: Payer: Self-pay | Admitting: Surgery

## 2024-01-11 ENCOUNTER — Ambulatory Visit: Admitting: Surgery

## 2024-01-11 VITALS — BP 175/90 | HR 64 | Temp 98.5°F | Ht 66.5 in | Wt 168.6 lb

## 2024-01-11 DIAGNOSIS — S31109A Unspecified open wound of abdominal wall, unspecified quadrant without penetration into peritoneal cavity, initial encounter: Secondary | ICD-10-CM

## 2024-01-11 NOTE — Telephone Encounter (Signed)
 Appt with dr Claudine Mouton today

## 2024-01-11 NOTE — Patient Instructions (Addendum)

## 2024-01-11 NOTE — Progress Notes (Signed)
 Patient ID: [name], female   DOB: Apr 24, 1966, 58 y.o.   MRN: [id number]  Chief Complaint: Draining sinus tract, persistent/abdominal wall seroma  History of Present Illness [name] is a 58 y.o. female with a history of bariatric surgery, followed by an extended abdominoplasty in May 2022 with diastases recti plication utilizing #1 V-Loc.  Op report reviewed from [facility], plastic surgeon reports a plication extending from the xiphoid to the umbilicus and from the umbilicus to the pubis.  All other sutures during that procedure are absorbable/Vicryl.  She has had recurring infections, and explorations for removal of retained suture.  She had opted for previous procedures done under local.  She has had a known right groin seroma documented on recent CT scan that has waxed and waned, treated with oral antibiotics on occasion. She has had a number of other torso plastic procedures including augmentation, and brachioplasty's. Of recent she has had a persisting drainage of a hypergranular site which would correlate to her epigastrium or mid abdomen.  She has had no recent fevers, chills, nausea or vomiting.  She denies any significant pain but she has become extremely tired of dealing with these persisting wounds. She has also been seen by specialist and wound care which has worked with packing/wicking this abdominal wall sinus tract, with gradual withdrawal of it and diminishing volume of packing/depth.  This has been unsuccessful at obtaining the healing pursued. She has some concerns about some potential communication of this midline wound with her right groin seroma.  Although today there is no evidence of any significant erythema or marked tenderness to the right groin area.    Past Medical History Past Medical History:  Diagnosis Date   Asthma due to seasonal allergies    no inhaler   History of diabetes mellitus, type II    per pt resolved after bariatric surgery 2017    History of hypertension    per pt resolved after bariatric surgery 2017   History of kidney stones    History of obstructive sleep apnea    per pt hx osa w/ cpap,  retested after bariatric surgery 2017 result negative   History of skin cancer    nose, not melanoma   Hydronephrosis, right    Hyperlipidemia    MRSA carrier    NAFL (nonalcoholic fatty liver)    Osteoporosis    Primary biliary cirrhosis (HCC)    Primary biliary cirrhosis (HCC)    Renal calculi    per CT 04-18-2019 bilateral , non-obstructive   S/P bariatric surgery 08/03/2016   duodenal switch w/ hh repair   Ureteral calculus, right    Wears glasses       Past Surgical History:  Procedure Laterality Date   AUGMENTATION MAMMAPLASTY Bilateral 07/2021   Implants and lift   AUGMENTATION MAMMAPLASTY Bilateral 07/05/2022   Dr Kizzie Bane, replaced implants   BARIATRIC SURGERY  08-03-2016    dr Smitty Cords @[facility] ,     LAPAROSCOPY DUODENAL SWITCH AND HIATAL HERNIA REPAIR   BRACHIOPLASTY     BREAST BIOPSY Right 2017   FIBROCYSTIC CHANGES WITH CALCIFICATIONS   CESAREAN SECTION  x2  last one 09-10-2000   COLONOSCOPY WITH PROPOFOL N/A 09/11/2019   Procedure: COLONOSCOPY WITH PROPOFOL;  Surgeon: Wyline Mood, MD;  Location: [facility] ENDOSCOPY;  Service: Gastroenterology;  Laterality: N/A;   CYSTOSCOPY WITH RETROGRADE PYELOGRAM, URETEROSCOPY AND STENT PLACEMENT Right 02-07-2012  @[facility]    CYSTOSCOPY WITH STENT PLACEMENT Right 09/10/2017   Procedure: CYSTOSCOPY WITH STENT  PLACEMENT;  Surgeon: Riki Altes, MD;  Location: [facility] ORS;  Service: Urology;  Laterality: Right;   CYSTOSCOPY/URETEROSCOPY/HOLMIUM LASER/STENT PLACEMENT Right 04/24/2019   Procedure: CYSTOSCOPY/URETEROSCOPY/STENT PLACEMENT/ RIGHT RETROGRADE;  Surgeon: Jerilee Field, MD;  Location: [facility];  Service: Urology;  Laterality: Right;   DX LAPAROSCOPY/  EGD/  HIATAL HERNIA REPAIR CLOSURE OF INTERNAL MENSENTRY DEFECT  10-28-2017   @[facility] ,  [place]    EXTRACORPOREAL SHOCK WAVE LITHOTRIPSY N/A 01/30/2019   Procedure: EXTRACORPOREAL SHOCK WAVE LITHOTRIPSY (ESWL) LEFT/ POSSIBLE RIGHT;  Surgeon: Jerilee Field, MD;  Location: [facility] ORS;  Service: Urology;  Laterality: N/A;   EXTRACORPOREAL SHOCK WAVE LITHOTRIPSY Right 03/20/2019   Procedure: EXTRACORPOREAL SHOCK WAVE LITHOTRIPSY (ESWL);  Surgeon: Marcine Matar, MD;  Location: [facility] ORS;  Service: Urology;  Laterality: Right;   EXTRACORPOREAL SHOCK WAVE LITHOTRIPSY Left 10/30/2021   Procedure: EXTRACORPOREAL SHOCK WAVE LITHOTRIPSY (ESWL);  Surgeon: Crist Fat, MD;  Location: [facility];  Service: Urology;  Laterality: Left;   LAPAROSCOPIC CHOLECYSTECTOMY  10-15-2016   @[facility]  ,  [place]   LIPOSUCTION     PANNICULECTOMY     2022   PLACEMENT OF BREAST IMPLANTS Bilateral    TONSILLECTOMY AND ADENOIDECTOMY  child   URETEROSCOPY WITH HOLMIUM LASER LITHOTRIPSY Right 09/10/2017   Procedure: URETEROSCOPY WITH HOLMIUM LASER LITHOTRIPSY;  Surgeon: Riki Altes, MD;  Location: [facility] ORS;  Service: Urology;  Laterality: Right;    Allergies  Allergen Reactions   Ivp Dye [Iodinated Contrast Media]     Patient 'codes' with IV contrast   Latex Shortness Of Breath and Cough   Tolmetin Hives   Gallium Nitrate Hives    AND Gadolinium Derivatives    Insulin Detemir Hives and Swelling    Lump at injection site   Nsaids Hives    Per pt stated has to take ibuprofen with a antihistimine   Gadolinium Derivatives Hives    Pt c/o itching after contrast injection 03/15/19 @ 8:45am. [facility] Per Dr. Fredia Sorrow document this as allergic reaction.     Current Outpatient Medications  Medication Sig Dispense Refill   amLODipine (NORVASC) 5 MG tablet Take 1 tablet (5 mg total) by mouth in the morning and at bedtime. 60 tablet 11   apixaban (ELIQUIS) 5 MG TABS tablet Take 1 tablet (5 mg total) by mouth 2 (two) times daily. 60 tablet 11   Multiple Vitamins-Minerals (BARIATRIC MULTIVITAMINS/IRON PO)  Take by mouth.     sulfamethoxazole-trimethoprim (BACTRIM DS) 800-160 MG tablet Take 1 tablet by mouth 2 (two) times daily for 7 days. 14 tablet 0   ursodiol (ACTIGALL) 250 MG tablet Take 250 mg by mouth in the morning, at noon, in the evening, and at bedtime.     No current facility-administered medications for this visit.    Family History Family History  Problem Relation Age of Onset   Seizures Mother    Alcohol abuse Mother    Hyperlipidemia Mother    Hypertension Mother    Lymphoma Mother        primary site : brain   Leukemia Mother    Parkinsonism Father    Hyperlipidemia Father    Hypertension Father    Parkinson's disease Father    Drug abuse Sister    Cancer Maternal Grandmother        breast cancer   Breast cancer Maternal Grandmother 32   Liver cancer Paternal Grandfather    Kidney cancer Neg Hx    Prostate cancer Neg Hx  Social History Social History   Tobacco Use   Smoking status: Never   Smokeless tobacco: Never  Vaping Use   Vaping status: Never Used  Substance Use Topics   Alcohol use: No    Alcohol/week: 0.0 standard drinks of alcohol   Drug use: Never        Review of Systems  Constitutional: Negative.   HENT: Negative.    Eyes: Negative.   Respiratory: Negative.    Cardiovascular: Negative.   Gastrointestinal: Negative.   Genitourinary: Negative.   Skin: Negative.   Neurological: Negative.   Psychiatric/Behavioral: Negative.       Physical Exam Blood pressure (!) 175/90, pulse 64, temperature 98.5 F (36.9 C), temperature source Oral, height 5' 6.5" (1.689 m), weight 168 lb 9.6 oz (76.5 kg), last menstrual period 07/11/2016, SpO2 99%. Last Weight  Most recent update: 01/11/2024 10:42 AM    Weight  76.5 kg (168 lb 9.6 oz)             CONSTITUTIONAL: Well developed, and nourished, appropriately responsive and aware without distress.   EYES: Sclera non-icteric.   EARS, NOSE, MOUTH AND THROAT:  The oropharynx is clear. Oral  mucosa is pink and moist.    Hearing is intact to voice.  NECK: Trachea is midline, and there is no jugular venous distension.  LYMPH NODES:  Lymph nodes in the neck are not appreciated. RESPIRATORY:  Lungs are clear, and breath sounds are equal bilaterally.  Normal respiratory effort without pathologic use of accessory muscles. CARDIOVASCULAR: Heart is regular in rate and rhythm.   Well perfused.  GI: The abdomen is notable for a proud 1 to 1.5 cm diameter raised "nipple like" lesion that is draining.   With sterile sterile Q-tip I explored the wound identifying a depth of at least 3-1/2 to 4 cm in multiple directions, the longest length was consistent with midline/parallel to the midline.  There is no egress of significant new content.  There is marked evidence of proud flesh/hypergranulation. In the right lower quadrant/groin area there is a well-healed scar consistent with her panniculectomy, there is a vague fullness in the area but nothing remarkable of an obvious seroma or inflammatory mass.  There is no tenderness there. Soft, nontender, and nondistended.I did not appreciate hepatosplenomegaly.  MUSCULOSKELETAL:  Symmetrical muscle tone appreciated in all four extremities.    SKIN: Skin turgor is normal. No pathologic skin lesions appreciated.  NEUROLOGIC:  Motor and sensation appear grossly normal.  Cranial nerves are grossly without defect. PSYCH:  Alert and oriented to person, place and time. Affect is appropriate for situation.  Data Reviewed I have personally reviewed what is currently available of the patient's imaging, recent labs and medical records.   Labs:     Latest Ref Rng & Units 09/30/2023    4:36 AM 09/29/2023    4:42 AM 04/03/2022    8:08 AM  CBC  WBC 4.0 - 10.5 K/uL 5.1  6.7  5.9   Hemoglobin 12.0 - 15.0 g/dL 04.5  40.9  81.1   Hematocrit 36.0 - 46.0 % 33.9  38.6  34.8   Platelets 150 - 400 K/uL 252  275  242       Latest Ref Rng & Units 12/15/2023    2:23 PM  10/12/2023    9:40 AM 09/30/2023    4:36 AM  CMP  Glucose 70 - 99 mg/dL 85  914  782   BUN 6 - 24 mg/dL 13  16  11  Creatinine 0.57 - 1.00 mg/dL 1.61  0.96  0.45   Sodium 134 - 144 mmol/L 145  146  143   Potassium 3.5 - 5.2 mmol/L 4.0  4.2  4.1   Chloride 96 - 106 mmol/L 110  110  111   CO2 20 - 29 mmol/L 23  23  23    Calcium 8.7 - 10.2 mg/dL 8.7  8.8  8.1   Total Protein 6.5 - 8.1 g/dL   6.2   Total Bilirubin <1.2 mg/dL   0.3   Alkaline Phos 38 - 126 U/L   138   AST 15 - 41 U/L   26   ALT 0 - 44 U/L   24       Imaging: Radiological images reviewed:   Within last 24 hrs: No results found.  Assessment    Nonhealing wound, likely persistent foreign body supraumbilical midline abdominal wall. 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   Headache 07/05/2020   Osteomalacia 12/27/2019   History of weight loss 11/28/2019   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   Localized osteoporosis without current pathological fracture 06/26/2019   Hypocalcemia 06/26/2019   Secondary hyperparathyroidism (HCC) 06/26/2019   Multinodular goiter 06/26/2019   Dysphagia 05/05/2019   Osteoporosis 05/03/2019   Memory loss 02/13/2019   Elevated liver function tests 10/13/2017   Epigastric abdominal pain 10/13/2017   History of kidney stones 10/13/2017   Hypertension 10/13/2017   Status post bariatric  surgery 09/03/2017   Right ureteral calculus 08/31/2017   Hydronephrosis with urinary obstruction due to ureteral calculus 08/31/2017   Nephrolithiasis 08/31/2017   Screen for STD (sexually transmitted disease) 07/26/2017   Sleep apnea 10/06/2016   Candidal intertrigo 09/21/2016   H/O gastric bypass 09/21/2016   Hiatal hernia 04/21/2016   Elevated liver enzymes 09/04/2014   Vitamin D deficiency 09/03/2014   Routine general medical examination at a health care facility 01/11/2014   T2DM (type 2 diabetes mellitus) (HCC) 01/11/2014    Plan    Abdominal wall exploration with excisional debridement of skin and subcutaneous tissues, placement of negative pressure wound therapy for healing by secondary intention.  Possible aspiration of previously known seroma cavity.  We discussed at length the role of persistent foreign body as a nidus for continuing/ongoing infection/sinus formation.  The need for healing by secondary intention and healing from internally to externally to obtain a less likely to recur sinus formation.  We discussed the risks of anesthesia, bleeding, prolonged wound care.  We discussed in detail negative pressure wound therapy and what to expect.  We discussed the role of prolonged wound care and additional scarring.  I am unable to make any guarantees regarding recurrence, persisting areas that fail to heal etc.   Face-to-face time spent with the patient and accompanying care providers(if present) was 60 minutes, spent counseling, educating, and coordinating care of the patient.    These notes generated with  voice recognition software. I apologize for typographical errors.  Campbell Lerner M.D., FACS 01/11/2024, 1:15 PM

## 2024-01-11 NOTE — H&P (View-Only) (Signed)
 Patient ID: [name], female   DOB: Apr 24, 1966, 58 y.o.   MRN: [id number]  Chief Complaint: Draining sinus tract, persistent/abdominal wall seroma  History of Present Illness Fayth Floretta Petro is a 58 y.o. female with a history of bariatric surgery, followed by an extended abdominoplasty in May 2022 with diastases recti plication utilizing #1 V-Loc.  Op report reviewed from [facility], plastic surgeon reports a plication extending from the xiphoid to the umbilicus and from the umbilicus to the pubis.  All other sutures during that procedure are absorbable/Vicryl.  She has had recurring infections, and explorations for removal of retained suture.  She had opted for previous procedures done under local.  She has had a known right groin seroma documented on recent CT scan that has waxed and waned, treated with oral antibiotics on occasion. She has had a number of other torso plastic procedures including augmentation, and brachioplasty's. Of recent she has had a persisting drainage of a hypergranular site which would correlate to her epigastrium or mid abdomen.  She has had no recent fevers, chills, nausea or vomiting.  She denies any significant pain but she has become extremely tired of dealing with these persisting wounds. She has also been seen by specialist and wound care which has worked with packing/wicking this abdominal wall sinus tract, with gradual withdrawal of it and diminishing volume of packing/depth.  This has been unsuccessful at obtaining the healing pursued. She has some concerns about some potential communication of this midline wound with her right groin seroma.  Although today there is no evidence of any significant erythema or marked tenderness to the right groin area.    Past Medical History Past Medical History:  Diagnosis Date   Asthma due to seasonal allergies    no inhaler   History of diabetes mellitus, type II    per pt resolved after bariatric surgery 2017    History of hypertension    per pt resolved after bariatric surgery 2017   History of kidney stones    History of obstructive sleep apnea    per pt hx osa w/ cpap,  retested after bariatric surgery 2017 result negative   History of skin cancer    nose, not melanoma   Hydronephrosis, right    Hyperlipidemia    MRSA carrier    NAFL (nonalcoholic fatty liver)    Osteoporosis    Primary biliary cirrhosis (HCC)    Primary biliary cirrhosis (HCC)    Renal calculi    per CT 04-18-2019 bilateral , non-obstructive   S/P bariatric surgery 08/03/2016   duodenal switch w/ hh repair   Ureteral calculus, right    Wears glasses       Past Surgical History:  Procedure Laterality Date   AUGMENTATION MAMMAPLASTY Bilateral 07/2021   Implants and lift   AUGMENTATION MAMMAPLASTY Bilateral 07/05/2022   Dr Kizzie Bane, replaced implants   BARIATRIC SURGERY  08-03-2016    dr Smitty Cords @[facility] ,     LAPAROSCOPY DUODENAL SWITCH AND HIATAL HERNIA REPAIR   BRACHIOPLASTY     BREAST BIOPSY Right 2017   FIBROCYSTIC CHANGES WITH CALCIFICATIONS   CESAREAN SECTION  x2  last one 09-10-2000   COLONOSCOPY WITH PROPOFOL N/A 09/11/2019   Procedure: COLONOSCOPY WITH PROPOFOL;  Surgeon: Wyline Mood, MD;  Location: [facility] ENDOSCOPY;  Service: Gastroenterology;  Laterality: N/A;   CYSTOSCOPY WITH RETROGRADE PYELOGRAM, URETEROSCOPY AND STENT PLACEMENT Right 02-07-2012  @[facility]    CYSTOSCOPY WITH STENT PLACEMENT Right 09/10/2017   Procedure: CYSTOSCOPY WITH STENT  PLACEMENT;  Surgeon: Riki Altes, MD;  Location: [facility] ORS;  Service: Urology;  Laterality: Right;   CYSTOSCOPY/URETEROSCOPY/HOLMIUM LASER/STENT PLACEMENT Right 04/24/2019   Procedure: CYSTOSCOPY/URETEROSCOPY/STENT PLACEMENT/ RIGHT RETROGRADE;  Surgeon: Jerilee Field, MD;  Location: [facility];  Service: Urology;  Laterality: Right;   DX LAPAROSCOPY/  EGD/  HIATAL HERNIA REPAIR CLOSURE OF INTERNAL MENSENTRY DEFECT  10-28-2017   @[facility] ,  [place]    EXTRACORPOREAL SHOCK WAVE LITHOTRIPSY N/A 01/30/2019   Procedure: EXTRACORPOREAL SHOCK WAVE LITHOTRIPSY (ESWL) LEFT/ POSSIBLE RIGHT;  Surgeon: Jerilee Field, MD;  Location: [facility] ORS;  Service: Urology;  Laterality: N/A;   EXTRACORPOREAL SHOCK WAVE LITHOTRIPSY Right 03/20/2019   Procedure: EXTRACORPOREAL SHOCK WAVE LITHOTRIPSY (ESWL);  Surgeon: Marcine Matar, MD;  Location: [facility] ORS;  Service: Urology;  Laterality: Right;   EXTRACORPOREAL SHOCK WAVE LITHOTRIPSY Left 10/30/2021   Procedure: EXTRACORPOREAL SHOCK WAVE LITHOTRIPSY (ESWL);  Surgeon: Crist Fat, MD;  Location: [facility];  Service: Urology;  Laterality: Left;   LAPAROSCOPIC CHOLECYSTECTOMY  10-15-2016   @[facility]  ,  [place]   LIPOSUCTION     PANNICULECTOMY     2022   PLACEMENT OF BREAST IMPLANTS Bilateral    TONSILLECTOMY AND ADENOIDECTOMY  child   URETEROSCOPY WITH HOLMIUM LASER LITHOTRIPSY Right 09/10/2017   Procedure: URETEROSCOPY WITH HOLMIUM LASER LITHOTRIPSY;  Surgeon: Riki Altes, MD;  Location: [facility] ORS;  Service: Urology;  Laterality: Right;    Allergies  Allergen Reactions   Ivp Dye [Iodinated Contrast Media]     Patient 'codes' with IV contrast   Latex Shortness Of Breath and Cough   Tolmetin Hives   Gallium Nitrate Hives    AND Gadolinium Derivatives    Insulin Detemir Hives and Swelling    Lump at injection site   Nsaids Hives    Per pt stated has to take ibuprofen with a antihistimine   Gadolinium Derivatives Hives    Pt c/o itching after contrast injection 03/15/19 @ 8:45am. [facility] Per Dr. Fredia Sorrow document this as allergic reaction.     Current Outpatient Medications  Medication Sig Dispense Refill   amLODipine (NORVASC) 5 MG tablet Take 1 tablet (5 mg total) by mouth in the morning and at bedtime. 60 tablet 11   apixaban (ELIQUIS) 5 MG TABS tablet Take 1 tablet (5 mg total) by mouth 2 (two) times daily. 60 tablet 11   Multiple Vitamins-Minerals (BARIATRIC MULTIVITAMINS/IRON PO)  Take by mouth.     sulfamethoxazole-trimethoprim (BACTRIM DS) 800-160 MG tablet Take 1 tablet by mouth 2 (two) times daily for 7 days. 14 tablet 0   ursodiol (ACTIGALL) 250 MG tablet Take 250 mg by mouth in the morning, at noon, in the evening, and at bedtime.     No current facility-administered medications for this visit.    Family History Family History  Problem Relation Age of Onset   Seizures Mother    Alcohol abuse Mother    Hyperlipidemia Mother    Hypertension Mother    Lymphoma Mother        primary site : brain   Leukemia Mother    Parkinsonism Father    Hyperlipidemia Father    Hypertension Father    Parkinson's disease Father    Drug abuse Sister    Cancer Maternal Grandmother        breast cancer   Breast cancer Maternal Grandmother 32   Liver cancer Paternal Grandfather    Kidney cancer Neg Hx    Prostate cancer Neg Hx  Social History Social History   Tobacco Use   Smoking status: Never   Smokeless tobacco: Never  Vaping Use   Vaping status: Never Used  Substance Use Topics   Alcohol use: No    Alcohol/week: 0.0 standard drinks of alcohol   Drug use: Never        Review of Systems  Constitutional: Negative.   HENT: Negative.    Eyes: Negative.   Respiratory: Negative.    Cardiovascular: Negative.   Gastrointestinal: Negative.   Genitourinary: Negative.   Skin: Negative.   Neurological: Negative.   Psychiatric/Behavioral: Negative.       Physical Exam Blood pressure (!) 175/90, pulse 64, temperature 98.5 F (36.9 C), temperature source Oral, height 5' 6.5" (1.689 m), weight 168 lb 9.6 oz (76.5 kg), last menstrual period 07/11/2016, SpO2 99%. Last Weight  Most recent update: 01/11/2024 10:42 AM    Weight  76.5 kg (168 lb 9.6 oz)             CONSTITUTIONAL: Well developed, and nourished, appropriately responsive and aware without distress.   EYES: Sclera non-icteric.   EARS, NOSE, MOUTH AND THROAT:  The oropharynx is clear. Oral  mucosa is pink and moist.    Hearing is intact to voice.  NECK: Trachea is midline, and there is no jugular venous distension.  LYMPH NODES:  Lymph nodes in the neck are not appreciated. RESPIRATORY:  Lungs are clear, and breath sounds are equal bilaterally.  Normal respiratory effort without pathologic use of accessory muscles. CARDIOVASCULAR: Heart is regular in rate and rhythm.   Well perfused.  GI: The abdomen is notable for a proud 1 to 1.5 cm diameter raised "nipple like" lesion that is draining.   With sterile sterile Q-tip I explored the wound identifying a depth of at least 3-1/2 to 4 cm in multiple directions, the longest length was consistent with midline/parallel to the midline.  There is no egress of significant new content.  There is marked evidence of proud flesh/hypergranulation. In the right lower quadrant/groin area there is a well-healed scar consistent with her panniculectomy, there is a vague fullness in the area but nothing remarkable of an obvious seroma or inflammatory mass.  There is no tenderness there. Soft, nontender, and nondistended.I did not appreciate hepatosplenomegaly.  MUSCULOSKELETAL:  Symmetrical muscle tone appreciated in all four extremities.    SKIN: Skin turgor is normal. No pathologic skin lesions appreciated.  NEUROLOGIC:  Motor and sensation appear grossly normal.  Cranial nerves are grossly without defect. PSYCH:  Alert and oriented to person, place and time. Affect is appropriate for situation.  Data Reviewed I have personally reviewed what is currently available of the patient's imaging, recent labs and medical records.   Labs:     Latest Ref Rng & Units 09/30/2023    4:36 AM 09/29/2023    4:42 AM 04/03/2022    8:08 AM  CBC  WBC 4.0 - 10.5 K/uL 5.1  6.7  5.9   Hemoglobin 12.0 - 15.0 g/dL 04.5  40.9  81.1   Hematocrit 36.0 - 46.0 % 33.9  38.6  34.8   Platelets 150 - 400 K/uL 252  275  242       Latest Ref Rng & Units 12/15/2023    2:23 PM  10/12/2023    9:40 AM 09/30/2023    4:36 AM  CMP  Glucose 70 - 99 mg/dL 85  914  782   BUN 6 - 24 mg/dL 13  16  11  Creatinine 0.57 - 1.00 mg/dL 1.61  0.96  0.45   Sodium 134 - 144 mmol/L 145  146  143   Potassium 3.5 - 5.2 mmol/L 4.0  4.2  4.1   Chloride 96 - 106 mmol/L 110  110  111   CO2 20 - 29 mmol/L 23  23  23    Calcium 8.7 - 10.2 mg/dL 8.7  8.8  8.1   Total Protein 6.5 - 8.1 g/dL   6.2   Total Bilirubin <1.2 mg/dL   0.3   Alkaline Phos 38 - 126 U/L   138   AST 15 - 41 U/L   26   ALT 0 - 44 U/L   24       Imaging: Radiological images reviewed:   Within last 24 hrs: No results found.  Assessment    Nonhealing wound, likely persistent foreign body supraumbilical midline abdominal wall. 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   Headache 07/05/2020   Osteomalacia 12/27/2019   History of weight loss 11/28/2019   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   Localized osteoporosis without current pathological fracture 06/26/2019   Hypocalcemia 06/26/2019   Secondary hyperparathyroidism (HCC) 06/26/2019   Multinodular goiter 06/26/2019   Dysphagia 05/05/2019   Osteoporosis 05/03/2019   Memory loss 02/13/2019   Elevated liver function tests 10/13/2017   Epigastric abdominal pain 10/13/2017   History of kidney stones 10/13/2017   Hypertension 10/13/2017   Status post bariatric  surgery 09/03/2017   Right ureteral calculus 08/31/2017   Hydronephrosis with urinary obstruction due to ureteral calculus 08/31/2017   Nephrolithiasis 08/31/2017   Screen for STD (sexually transmitted disease) 07/26/2017   Sleep apnea 10/06/2016   Candidal intertrigo 09/21/2016   H/O gastric bypass 09/21/2016   Hiatal hernia 04/21/2016   Elevated liver enzymes 09/04/2014   Vitamin D deficiency 09/03/2014   Routine general medical examination at a health care facility 01/11/2014   T2DM (type 2 diabetes mellitus) (HCC) 01/11/2014    Plan    Abdominal wall exploration with excisional debridement of skin and subcutaneous tissues, placement of negative pressure wound therapy for healing by secondary intention.  Possible aspiration of previously known seroma cavity.  We discussed at length the role of persistent foreign body as a nidus for continuing/ongoing infection/sinus formation.  The need for healing by secondary intention and healing from internally to externally to obtain a less likely to recur sinus formation.  We discussed the risks of anesthesia, bleeding, prolonged wound care.  We discussed in detail negative pressure wound therapy and what to expect.  We discussed the role of prolonged wound care and additional scarring.  I am unable to make any guarantees regarding recurrence, persisting areas that fail to heal etc.   Face-to-face time spent with the patient and accompanying care providers(if present) was 60 minutes, spent counseling, educating, and coordinating care of the patient.    These notes generated with  voice recognition software. I apologize for typographical errors.  Campbell Lerner M.D., FACS 01/11/2024, 1:15 PM

## 2024-01-12 ENCOUNTER — Other Ambulatory Visit: Payer: Self-pay

## 2024-01-12 DIAGNOSIS — S31109A Unspecified open wound of abdominal wall, unspecified quadrant without penetration into peritoneal cavity, initial encounter: Secondary | ICD-10-CM

## 2024-01-14 ENCOUNTER — Telehealth: Payer: Self-pay | Admitting: Surgery

## 2024-01-14 NOTE — Telephone Encounter (Signed)
 Patient has been advised of Pre-Admission date/time, and Surgery date at [facility].  Surgery Date: 01/26/24 Preadmission Testing Date: 01/19/24 (phone 1p-4p)  Patient has been made aware to call [phone], between 1-3:00pm the day before surgery, to find out what time to arrive for surgery.    Patient reminded to bring wound vac with her to the OR as it will be sent to her directly from [facility] with [employer].

## 2024-01-18 ENCOUNTER — Ambulatory Visit: Payer: Self-pay | Admitting: Surgery

## 2024-01-18 DIAGNOSIS — S31109A Unspecified open wound of abdominal wall, unspecified quadrant without penetration into peritoneal cavity, initial encounter: Secondary | ICD-10-CM

## 2024-01-19 ENCOUNTER — Other Ambulatory Visit: Payer: Self-pay

## 2024-01-19 ENCOUNTER — Telehealth: Payer: Self-pay | Admitting: *Deleted

## 2024-01-19 ENCOUNTER — Encounter
Admission: RE | Admit: 2024-01-19 | Discharge: 2024-01-19 | Disposition: A | Discharge status: Home / Self Care | Source: Ambulatory Visit | Attending: Surgery | Admitting: Surgery

## 2024-01-19 HISTORY — DX: Calculus of kidney: N20.0

## 2024-01-19 HISTORY — DX: Diaphragmatic hernia without obstruction or gangrene: K44.9

## 2024-01-19 HISTORY — DX: Mild cervical dysplasia: N87.0

## 2024-01-19 HISTORY — DX: Personal history of other diseases of the digestive system: Z87.19

## 2024-01-19 HISTORY — DX: Iron deficiency anemia, unspecified: D50.9

## 2024-01-19 HISTORY — DX: Nontoxic multinodular goiter: E04.2

## 2024-01-19 HISTORY — DX: Unspecified malignant neoplasm of skin of nose: C44.301

## 2024-01-19 NOTE — Patient Instructions (Addendum)
 Your procedure is scheduled on: Wednesday, March 19 Report to the Registration Desk on the 1st floor of the [facility] To find out your arrival time, please call [phone] between 1PM - 3PM on: Tuesday, March 18 If your arrival time is 6:00 am, do not arrive before that time as the [facility] entrance doors do not open until 6:00 am.  REMEMBER: Instructions that are not followed completely may result in serious medical risk, up to and including death; or upon the discretion of your surgeon and anesthesiologist your surgery may need to be rescheduled.  Do not eat or drink after midnight the night before surgery.  No gum chewing or hard candies.  One week prior to surgery: starting March 12 Stop Anti-inflammatories (NSAIDS) such as Advil, Aleve, Ibuprofen, Motrin, Naproxen, Naprosyn and Aspirin based products such as Excedrin, Goody's Powder, BC Powder. Stop ANY OVER THE COUNTER supplements until after surgery. Stop multiple vitamins.  You may however, continue to take Tylenol if needed for pain up until the day of surgery.  apixaban (ELIQUIS) - per instructions from Dr. Claudine Mouton; hold for 2 days before surgery. Last day to take is Sunday, March 16. Resume AFTER surgery per surgeon instruction.  Continue taking all of your other prescription medications up until the day of surgery.  ON THE DAY OF SURGERY ONLY TAKE THESE MEDICATIONS WITH SIPS OF WATER:  Amlodipine  No Alcohol for 24 hours before or after surgery.  No Smoking including e-cigarettes for 24 hours before surgery.  No chewable tobacco products for at least 6 hours before surgery.  No nicotine patches on the day of surgery.  Do not use any "recreational" drugs for at least a week (preferably 2 weeks) before your surgery.  Please be advised that the combination of cocaine and anesthesia may have negative outcomes, up to and including death. If you test positive for cocaine, your surgery will be cancelled.  On the  morning of surgery brush your teeth with toothpaste and water, you may rinse your mouth with mouthwash if you wish. Do not swallow any toothpaste or mouthwash.  Use CHG Soap as directed on instruction sheet.  Do not wear jewelry, make-up, hairpins, clips or nail polish.  For welded (permanent) jewelry: bracelets, anklets, waist bands, etc.  Please have this removed prior to surgery.  If it is not removed, there is a chance that hospital personnel will need to cut it off on the day of surgery.  Do not wear lotions, powders, or perfumes.   Do not shave body hair from the neck down 48 hours before surgery.  Contact lenses, hearing aids and dentures may not be worn into surgery.  Do not bring valuables to the hospital. [facility] is not responsible for any missing/lost belongings or valuables.   Notify your doctor if there is any change in your medical condition (cold, fever, infection).  Wear comfortable clothing (specific to your surgery type) to the hospital.  After surgery, you can help prevent lung complications by doing breathing exercises.  Take deep breaths and cough every 1-2 hours. Your doctor may order a device called an Incentive Spirometer to help you take deep breaths. When coughing or sneezing, hold a pillow firmly against your incision with both hands. This is called "splinting." Doing this helps protect your incision. It also decreases belly discomfort.  If you are being discharged the day of surgery, you will not be allowed to drive home. You will need a responsible individual to drive you  home and stay with you for 24 hours after surgery.   If you are taking public transportation, you will need to have a responsible individual with you.  Please call the Pre-admissions Testing Dept. at [phone] if you have any questions about these instructions.  Surgery Visitation Policy:  Patients having surgery or a procedure may have two visitors.  Children under the age of  73 must have an adult with them who is not the patient.  Temporary Visitor Restrictions Due to increasing cases of flu, RSV and COVID-19: Children ages 51 and under will not be able to visit patients in [facility] hospitals under most circumstances.      Preparing for Surgery with CHLORHEXIDINE GLUCONATE (CHG) Soap  Chlorhexidine Gluconate (CHG) Soap  o An antiseptic cleaner that kills germs and bonds with the skin to continue killing germs even after washing  o Used for showering the night before surgery and morning of surgery  Before surgery, you can play an important role by reducing the number of germs on your skin.  CHG (Chlorhexidine gluconate) soap is an antiseptic cleanser which kills germs and bonds with the skin to continue killing germs even after washing.  Please do not use if you have an allergy to CHG or antibacterial soaps. If your skin becomes reddened/irritated stop using the CHG.  1. Shower the NIGHT BEFORE SURGERY and the MORNING OF SURGERY with CHG soap.  2. If you choose to wash your hair, wash your hair first as usual with your normal shampoo.  3. After shampooing, rinse your hair and body thoroughly to remove the shampoo.  4. Use CHG as you would any other liquid soap. You can apply CHG directly to the skin and wash gently with a scrungie or a clean washcloth.  5. Apply the CHG soap to your body only from the neck down. Do not use on open wounds or open sores. Avoid contact with your eyes, ears, mouth, and genitals (private parts). Wash face and genitals (private parts) with your normal soap.  6. Wash thoroughly, paying special attention to the area where your surgery will be performed.  7. Thoroughly rinse your body with warm water.  8. Do not shower/wash with your normal soap after using and rinsing off the CHG soap.  9. Pat yourself dry with a clean towel.  10. Wear clean pajamas to bed the night before surgery.  12. Place clean sheets on your bed  the night of your first shower and do not sleep with pets.  13. Shower again with the CHG soap on the day of surgery prior to arriving at the hospital.  14. Do not apply any deodorants/lotions/powders.  15. Please wear clean clothes to the hospital.

## 2024-01-19 NOTE — Telephone Encounter (Signed)
-----   Message from Verlee Monte sent at 01/19/2024  4:36 PM EDT ----- Regarding: Request for pre-operative cardiac clearance Request for pre-operative cardiac clearance:  1. What type of surgery is being performed?  EXPLORATORY LAPAROTOMY  2. When is this surgery scheduled?  01/26/2024  3. Type of clearance being requested (medical, pharmacy, both)? BOTH   4. Are there any medications that need to be held prior to surgery? APIXABAN  5. Practice name and name of physician performing surgery?  Performing surgeon: Dr. Campbell Lerner, MD Requesting clearance: Quentin Mulling, FNP-C    6. Anesthesia type (none, local, MAC, general)? GENERAL  7. What is the office phone and fax number?   Phone: [phone] Fax: [phone]  ATTENTION: Unable to create telephone message as per your standard workflow. Directed by [facility] providers to send requests for cardiac clearance to this pool for appropriate distribution to provider covering pre-operative clearances.   Quentin Mulling, MSN, APRN, FNP-C, CEN [facility]  Peri-operative Services Nurse Practitioner Phone: [phone] 01/19/24 4:36 PM

## 2024-01-19 NOTE — Telephone Encounter (Signed)
   Pre-operative Risk Assessment    Patient Name: [name]  DOB: 05-09-1966 MRN: [id number]   Date of last office visit: 12/21/23 SHERI HAMMOCK, NP Date of next office visit: 03/20/24 SHERI HAMMOCK, NP   Request for Surgical Clearance    Procedure: EXPLORATORY LAPAROTOMY   Date of Surgery:  Clearance 01/26/24                                Surgeon:  DR. Claudine Mouton Surgeon's Group or Practice Name:  [facility]  Phone number:  [phone] Quentin Mulling, FNP Fax number:  [phone]   Type of Clearance Requested:   - Medical  - Pharmacy:  Hold Apixaban (Eliquis)     Type of Anesthesia:  General    Additional requests/questions:    Elpidio Anis   01/19/2024, 4:47 PM

## 2024-01-20 NOTE — Telephone Encounter (Signed)
 [name] 58 year old female was recently seen by you in clinic  (12/21/2023).  She had presented to the emergency department with complaints of dizziness and palpitations.  She was noted to be in atrial fibrillation with RVR.  When she followed up with you in clinic her EKG showed sinus bradycardia 55 bpm.  She did note that she was only taking metoprolol as needed.  She was also not taking apixaban at that time.  Her blood pressure was noted to be 154/92.  She is requesting preoperative cardiac evaluation for exploratory laparotomy.  Would you be able to comment on cardiac risk for upcoming surgery?  Thank you for your help.  Please direct your response to CV DIV preop pool.  Thomasene Ripple. Inioluwa Boulay NP-C     01/20/2024, 4:23 PM [facility] [address] Office [phone] Fax [phone]

## 2024-01-20 NOTE — Telephone Encounter (Signed)
 Patient with diagnosis of afib on Eliquis for anticoagulation.    Procedure: EXPLORATORY LAPAROTOMY  Date of procedure: 01/26/24   CHA2DS2-VASc Score = 3   This indicates a 3.2% annual risk of stroke. The patient's score is based upon: CHF History: 0 HTN History: 1 Diabetes History: 1 Stroke History: 0 Vascular Disease History: 0 Age Score: 0 Gender Score: 1      CrCl 97 ml/min Platelet count 252  Per office protocol, patient can hold Eliquis for 2 days prior to procedure.    **This guidance is not considered finalized until pre-operative APP has relayed final recommendations.**

## 2024-01-24 ENCOUNTER — Encounter: Payer: Self-pay | Admitting: Surgery

## 2024-01-24 NOTE — Progress Notes (Signed)
 Perioperative / Anesthesia Services  Pre-Admission Testing Clinical Review / Pre-Operative Anesthesia Consult  Date: 01/25/24  Patient Demographics:  Name: [name] DOB: [birth year] MRN:   [id number]  Planned Surgical Procedure(s):    Case: [id number] Date/Time: 01/26/24 0715   Procedures:      LAPAROTOMY, EXPLORATORY (Abdomen)     DEBRIDEMENT, SKIN, FULL-THICKNESS     APPLICATION, WOUND VAC   Anesthesia type: General   Diagnosis: Open wound of abdominal wall with complication, initial encounter [S31.109A]   Pre-op diagnosis: open wound of abdominal wall   Location: [facility] OR ROOM 04 / [facility]   Surgeons: Campbell Lerner, MD      NOTE: Available PAT nursing documentation and vital signs have been reviewed. Clinical nursing staff has updated patient's PMH/PSHx, current medication list, and drug allergies/intolerances to ensure comprehensive history available to assist in medical decision making as it pertains to the aforementioned surgical procedure and anticipated anesthetic course. Extensive review of available clinical information personally performed. [place] PMH and PSHx updated with any diagnoses/procedures that  may have been inadvertently omitted during his intake with the pre-admission testing department's nursing staff.  Clinical Discussion:  [name] is a 58 y.o. female who is submitted for pre-surgical anesthesia review and clearance prior to her undergoing the above procedure. Patient has never been a smoker in the past. 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  Patient is followed by cardiology Cathe Mons, NP-C). She was last seen in the cardiology clinic on 10/26/2023; notes reviewed. At the time  of her clinic visit, patient with reports of elevated blood pressure and headaches.  It was discovered that patient had not been taking her prescribed blood pressure medications or anticoagulation therapy.  She denied any chest pain, shortness of breath, PND, orthopnea, palpitations, significant peripheral edema, weakness, fatigue, vertiginous symptoms, or presyncope/syncope. Patient with a past medical history significant for cardiovascular diagnoses. Documented physical exam was grossly benign, providing no evidence of acute exacerbation and/or decompensation of the patient's known cardiovascular conditions.  Most recent TTE performed on 09/30/2023 revealed a normal left ventricular systolic function with an EF of 55-60%. There were no regional wall motion abnormalities. Left ventricular diastolic Doppler parameters consistent with abnormal relaxation (G1DD). Right ventricular size and function normal with a TAPSE measuring 2.3 cm  (normal range >/= 1.6 cm).  RVSP = 18.4 mmHg. aortic valve noted to be sclerotic.  There was no evidence of significant valvular regurgitation.  All transvalvular gradients were noted to be normal providing no evidence suggestive of valvular stenosis. Aorta normal in size with no evidence of ectasia or aneurysmal dilatation.  Patient with a newly diagnosed atrial fibrillation in 09/2023; CHA2DS2-VASc Score = 3 (sex, HTN, T2DM).  Rate and rhythm currently being maintained intrinsically without the need for pharmacological intervention.  Patient is chronically anticoagulated using standard dose apixaban.  Patient reportedly compliant with therapy with no evidence or reports of GI/GU related bleeding.  As previously mentioned, patient had not been taking her antihypertensive medications.  Blood pressure in the clinic was documented at 154/98 mmHg on prescribed CCB (amlodipine) monotherapy.  Patient was not taking any type of lipid-lowering therapies for her HLD diagnosis and ASCVD  prevention.  Patient formally had an OSAH diagnosis, however following significant resulting from her biliopancreatic diversion with duodenal switch procedure, patient no  longer requires the use of nocturnal PAP therapy.  T2DM well-controlled with diet lifestyle modification alone following the aforementioned surgical procedure and subsequent weight loss. Patient is able to complete all of her  ADL/IADLs without cardiovascular limitation.  Per the DASI, patient is able to achieve at least 4 METS of physical activity without experiencing any significant degree of angina/anginal equivalent symptoms.  Patient was encouraged to restart medications as prescribed.  No changes were made to her medication regimen during her visit with cardiology.  Patient scheduled to follow-up with outpatient cardiology in 2 months or sooner if needed.  [name] is scheduled for an elective LAPAROTOMY, EXPLORATORY (Abdomen); DEBRIDEMENT, SKIN, FULL-THICKNESS; APPLICATION, WOUND VAC on 01/26/2024 with Dr. Campbell Lerner.  Given patient's past medical history significant for cardiovascular diagnoses, presurgical cardiac clearance was sought by the PAT team. Per cardiology, "her RCRI is very low risk, 0.4% risk of major cardiac event.  Based ACC/AHA guidelines, the patient's past medical history, and the amount of time since her last clinic visit, this patient would be at an overall ACCEPTABLE risk for the planned procedure without further cardiovascular testing or intervention at this time".   Again, this patient is on daily oral anticoagulation therapy using a DOAC medication.  She has been instructed on recommendations for holding her apixaban for 2 days prior to her procedure with plans to restart as soon as postoperative bleeding risk felt to be minimized by his attending surgeon. The patient has been instructed that her last dose of apixaban should be on 01/23/2024.  Patient denies previous perioperative  complications with anesthesia in the past. In review her EMR, it is noted that patient underwent a MAC anesthetic course at [facility] ([facility]) in 10/2023 without documented complications.      01/19/2024    3:49 PM 01/11/2024   10:40 AM 01/05/2024    8:03 AM  Vitals with BMI  Height 5' 6.5" 5' 6.5" 5' 6.5"  Weight 170 lbs 168 lbs 10 oz 170 lbs 12 oz  BMI 27.03 26.81 27.15  Systolic  175 130  Diastolic  90 82  Pulse  64 74   Providers/Specialists:  NOTE: Primary physician provider listed below. Patient may have been seen by APP or partner within same practice.   PROVIDER ROLE / SPECIALTY LAST Barton Fanny, MD General Surgery (Surgeon) 01/11/2024  Allegra Grana, FNP Primary Care Provider 01/05/2024  Charlsie Quest, NP-C Cardiology 12/21/2023   Allergies:   Allergies  Allergen Reactions   Ivp Dye [Iodinated Contrast Media]     Patient 'codes' with IV contrast   Latex Hives, Shortness Of Breath and Cough   Tolmetin Hives   Gallium Nitrate Hives    AND Gadolinium Derivatives    Insulin Detemir Hives and Swelling    Lump at injection site   Nsaids Hives    Per pt stated has to take ibuprofen with a antihistimine   Gadolinium Derivatives Hives    Pt c/o itching after contrast injection 03/15/19 @ 8:45am. [facility] Per Dr. Fredia Sorrow document this as allergic reaction.    Current Home Medications:   No current facility-administered medications for this encounter.    amLODipine (NORVASC) 5 MG tablet   apixaban (ELIQUIS) 5 MG TABS tablet   Multiple Vitamins-Minerals (BARIATRIC MULTIVITAMINS/IRON PO)   ursodiol (ACTIGALL) 250 MG tablet   History:   Past Medical History:  Diagnosis Date   Asthma due to seasonal allergies    Bradycardia    Diastolic  dysfunction    Dysplasia of cervix, low grade (CIN 1)    Hiatal hernia    a.) s/p repair concurrently with biliopancreatic diversion with duodenal switch procedure in 2017   History of diabetes mellitus,  type II    a.) resolved after bariatric surgery done in 2017   History of hiatal hernia    History of kidney stones    History of obstructive sleep apnea    a.) resolved after bariatric surgery done in 2017; has had repeat PSG testing that was negative since surgery   HTN (hypertension)    Hydronephrosis, right    Hyperlipidemia    Iron deficiency anemia    MRSA carrier    Multinodular goiter    NAFL (nonalcoholic fatty liver)    Nephrolithiasis    New onset atrial fibrillation (HCC) 09/29/2023   a.) Dx'd 09/2023; b.) CHA2DS2VASc = 3 (sex, HTN,  T2DM) as of 01/24/2024; b.) rate/rhythm maintained intrinsically without need for pharmacological intervention; chronically anticoagulated with apixaban   On apixaban therapy    Osteoporosis    Primary biliary cirrhosis (HCC)    Skin cancer of nose    Status post biliopancreatic diversion with duodenal switch 08/03/2016   Wears glasses    Past Surgical History:  Procedure Laterality Date   AUGMENTATION MAMMAPLASTY Bilateral 07/2021   Implants and lift   AUGMENTATION MAMMAPLASTY Bilateral 07/05/2022   Dr Kizzie Bane, replaced implants   BARIATRIC SURGERY  08/03/2016   LAPAROSCOPY DUODENAL SWITCH AND HIATAL HERNIA REPAIR; Dr. Smitty Cords @[facility] , [place]   BRACHIOPLASTY Bilateral    BREAST BIOPSY Right 2017   FIBROCYSTIC CHANGES WITH CALCIFICATIONS   CESAREAN SECTION     x 2   COLONOSCOPY WITH PROPOFOL N/A 09/11/2019   Procedure: COLONOSCOPY WITH PROPOFOL;  Surgeon: Wyline Mood, MD;  Location: [facility] ENDOSCOPY;  Service: Gastroenterology;  Laterality: N/A;   CYSTOSCOPY WITH RETROGRADE PYELOGRAM, URETEROSCOPY AND STENT PLACEMENT Right 02/06/2022   CYSTOSCOPY WITH STENT PLACEMENT Right 09/10/2017   Procedure: CYSTOSCOPY WITH STENT PLACEMENT;  Surgeon: Riki Altes, MD;  Location: [facility] ORS;  Service: Urology;  Laterality: Right;   CYSTOSCOPY/URETEROSCOPY/HOLMIUM LASER/STENT PLACEMENT Right 04/24/2019   Procedure: CYSTOSCOPY/URETEROSCOPY/STENT  PLACEMENT/ RIGHT RETROGRADE;  Surgeon: Jerilee Field, MD;  Location: [facility];  Service: Urology;  Laterality: Right;   DX LAPAROSCOPY/  EGD/  HIATAL HERNIA REPAIR CLOSURE OF INTERNAL MENSENTRY DEFECT  10/28/2017   @[facility] , [place]   EXTRACORPOREAL SHOCK WAVE LITHOTRIPSY N/A 01/30/2019   Procedure: EXTRACORPOREAL SHOCK WAVE LITHOTRIPSY (ESWL) LEFT/ POSSIBLE RIGHT;  Surgeon: Jerilee Field, MD;  Location: [facility]S;  Service: Urology;  Laterality: N/A;   EXTRACORPOREAL SHOCK WAVE LITHOTRIPSY Right 03/20/2019   Procedure: EXTRACORPOREAL SHOCK WAVE LITHOTRIPSY (ESWL);  Surgeon: Marcine Matar, MD;  Location: [facility];  Service: Urology;  Laterality: Right;   EXTRACORPOREAL SHOCK WAVE LITHOTRIPSY Left 10/30/2021   Procedure: EXTRACORPOREAL SHOCK WAVE LITHOTRIPSY (ESWL);  Surgeon: Crist Fat, MD;  Location: [facility];  Service: Urology;  Laterality: Left;   LAPAROSCOPIC CHOLECYSTECTOMY  10/15/2016   @[facility]  , [place]   LIPOSUCTION     PANNICULECTOMY     2022   TONSILLECTOMY AND ADENOIDECTOMY  child   URETEROSCOPY WITH HOLMIUM LASER LITHOTRIPSY Right 09/10/2017   Procedure: URETEROSCOPY WITH HOLMIUM LASER LITHOTRIPSY;  Surgeon: Riki Altes, MD;  Location: [facility] ORS;  Service: Urology;  Laterality: Right;   Family History  Problem Relation Age of Onset   Seizures Mother    Alcohol abuse Mother    Hyperlipidemia  Mother    Hypertension Mother    Lymphoma Mother        primary site : brain   Leukemia Mother    Parkinsonism Father    Hyperlipidemia Father    Hypertension Father    Parkinson's disease Father    Drug abuse Sister    Cancer Maternal Grandmother        breast cancer   Breast cancer Maternal Grandmother 82   Liver cancer Paternal Grandfather    Kidney cancer Neg Hx    Prostate cancer Neg Hx    Social History   Tobacco Use   Smoking status: Never   Smokeless tobacco: Never  Substance Use Topics   Alcohol use: No     Alcohol/week: 0.0 standard drinks of alcohol   Pertinent Clinical Results:  LABS:  Office Visit on 01/05/2024  Component Date Value Ref Range Status   Color, UA 01/05/2024 yellow   Final   Clarity, UA 01/05/2024 clear   Final   Glucose, UA 01/05/2024 Negative  Negative Final   Bilirubin, UA 01/05/2024 neg   Final   Ketones, UA 01/05/2024 neg   Final   Spec Grav, UA 01/05/2024 1.025  1.010 - 1.025 Final   Blood, UA 01/05/2024 neg   Final   pH, UA 01/05/2024 5.5  5.0 - 8.0 Final   Protein, UA 01/05/2024 Positive (A)  Negative Final   Urobilinogen, UA 01/05/2024 0.2  0.2 or 1.0 E.U./dL Final   Nitrite, UA 16/08/9603 positive   Final   Leukocytes, UA 01/05/2024 Trace (A)  Negative Final   Urine Culture, Routine 01/05/2024 Final report (A)   Final   Organism ID, Bacteria 01/05/2024 Comment (A)   Final   Comment: Escherichia coli, identified by an automated biochemical system. Cefazolin with an MIC <=16 predicts susceptibility to the oral agents cefaclor, cefdinir, cefpodoxime, cefprozil, cefuroxime, cephalexin, and loracarbef when used for therapy of uncomplicated urinary tract infections due to E. coli, Klebsiella pneumoniae, and Proteus mirabilis. Greater than 100,000 colony forming units per mL    Antimicrobial Susceptibility 01/05/2024 Comment   Final   Comment:       ** S = Susceptible; I = Intermediate; R = Resistant **                    P = Positive; N = Negative             MICS are expressed in micrograms per mL    Antibiotic                 RSLT#1    RSLT#2    RSLT#3    RSLT#4 Amoxicillin/Clavulanic Acid    S Ampicillin                     S Cefazolin                      S Cefepime                       S Cefoxitin                      S Cefpodoxime                    S Ceftriaxone                    S Ciprofloxacin  S Ertapenem                      S Gentamicin                     S Levofloxacin                   S Meropenem                       S Nitrofurantoin                 S Piperacillin/Tazobactam        S Tetracycline                   S Tobramycin                     S Trimethoprim/Sulfa             S    Specific Gravity, UA 01/05/2024 1.022  1.005 - 1.030 Final   pH, UA 01/05/2024 5.5  5.0 - 7.5 Final   Color, UA 01/05/2024 Yellow  Yellow Final   Appearance Ur 01/05/2024 Clear  Clear Final   Leukocytes,UA 01/05/2024 Trace (A)  Negative Final   Protein,UA 01/05/2024 Trace  Negative/Trace Final   Glucose, UA 01/05/2024 Negative  Negative Final   Ketones, UA 01/05/2024 Trace (A)  Negative Final   RBC, UA 01/05/2024 Negative  Negative Final   Bilirubin, UA 01/05/2024 Negative  Negative Final   Urobilinogen, Ur 01/05/2024 0.2  0.2 - 1.0 mg/dL Final   Nitrite, UA 32/44/0102 Positive (A)  Negative Final   Microscopic Examination 01/05/2024 See below:   Final   Microscopic was indicated and was performed.   WBC, UA 01/05/2024 >30 (A)  0 - 5 /hpf Final   RBC, Urine 01/05/2024 None seen  0 - 2 /hpf Final   Epithelial Cells (non renal) 01/05/2024 0-10  0 - 10 /hpf Final   Casts 01/05/2024 None seen  None seen /lpf Final   Crystals 01/05/2024 Present (A)  N/A Final   Crystal Type 01/05/2024 Calcium Oxalate  N/A Final   Bacteria, UA 01/05/2024 Many (A)  None seen/Few Final    ECG: Date: 12/21/2023  Time ECG obtained: 0806 AM Rate: 55 bpm Rhythm: sinus bradycardia Axis (leads I and aVF): normal Intervals: PR 152 ms. QRS 96 ms. QTc 428 ms. ST segment and T wave changes: No evidence of acute T wave abnormalities or significant ST segment elevation or depression.  Evidence of a possible, age undetermined, prior infarct:  Yes; septal Comparison: Previous tracing obtained on 10/26/2023 showed normal sinus rhythm with LVH at a rate of 60 bpm   IMAGING / PROCEDURES: TRANSTHORACIC ECHOCARDIOGRAM performed on 09/30/2023 Left ventricular ejection fraction, by estimation, is 55 to 60%. The left ventricle has normal function. The  left ventricle has no regional wall motion abnormalities. Left ventricular diastolic parameters are consistent with Grade I diastolic dysfunction (impaired relaxation).  Right ventricular systolic function is normal. The right ventricular size is normal. There is normal pulmonary artery systolic pressure. The estimated right ventricular systolic pressure is 18.4 mmHg.  The mitral valve is normal in structure. No evidence of mitral valve regurgitation. No evidence of mitral stenosis.  The aortic valve is tricuspid. Aortic valve regurgitation is not visualized. Aortic valve sclerosis is present, with no evidence of aortic valve stenosis.  The inferior vena cava  is normal in size with greater than 50% respiratory variability, suggesting right atrial pressure of 3 mmHg.   CT ABDOMEN PELVIS WO CONTRAST performed on 01/10/2024 11.1 x 1.5 x 4.4 cm ill-defined fluid collection in the deep subcutaneous fat of the lower right anterior abdominal wall, just superficial to the rectus fascia. There is an associated midline component in the suprapubic region that measures on the order of 1.7 x 3.0 x 2.9 cm. These collections likely communicate with each other. Findings could reflect a postoperative seroma. Abscess would also be a consideration although assessment is limited by lack of intravenous contrast material. Stranding in the surrounding subcutaneous fat raises the question of cellulitis. Fluid-filled mildly distended small bowel in the pelvis measuring up to 2.9 cm diameter. Assessment for wall thickening is not possible due to lack of oral and intravenous contrast material. No substantial perienteric edema. Imaging features are nonspecific and may be related to ileus. Bilateral nonobstructing renal stones.  Impression and Plan:  [name] has been referred for pre-anesthesia review and clearance prior to her undergoing the planned anesthetic and procedural courses. Available labs, pertinent testing,  and imaging results were personally reviewed by me in preparation for upcoming operative/procedural course. [facility] medical record has been updated following extensive record review and patient interview with PAT staff.   This patient has been appropriately cleared by cardiology with an overall ACCEPTABLE risk of experiencing significant perioperative cardiovascular complications. Based on clinical review performed today (01/25/24), barring any significant acute changes in the patient's overall condition, it is anticipated that she will be able to proceed with the planned surgical intervention. Any acute changes in clinical condition may necessitate her procedure being postponed and/or cancelled. Patient will meet with anesthesia team (MD and/or CRNA) on the day of her procedure for preoperative evaluation/assessment. Questions regarding anesthetic course will be fielded at that time.   Pre-surgical instructions were reviewed with the patient during his PAT appointment, and questions were fielded to satisfaction by PAT clinical staff. She has been instructed on which medications that she will need to hold prior to surgery, as well as the ones that have been deemed safe/appropriate to take on the day of his procedure. As part of the general education provided by PAT, patient made aware both verbally and in writing, that she would need to abstain from the use of any illegal substances during his perioperative course. She was advised that failure to follow the provided instructions could necessitate case cancellation or result in serious perioperative complications up to and including death. Patient encouraged to contact PAT and/or her surgeon's office to discuss any questions or concerns that may arise prior to surgery; verbalized understanding.   Quentin Mulling, MSN, APRN, FNP-C, CEN [facility]  Perioperative Services Nurse Practitioner Phone: [phone] Fax: [phone] 01/25/24  4:00 PM  NOTE: This note has been prepared using Dragon dictation software. Despite my best ability to proofread, there is always the potential that unintentional transcriptional errors may still occur from this process.

## 2024-01-25 NOTE — Telephone Encounter (Signed)
     Primary Cardiologist: None  Chart reviewed as part of pre-operative protocol coverage. Given past medical history and time since last visit, based on ACC/AHA guidelines, [name] would be at acceptable risk for the planned procedure without further cardiovascular testing.   Her RCRI is very low risk, 0.4% risk of major cardiac event.  Per office protocol, patient can hold Eliquis for 2 days prior to procedure.   I will route this recommendation to the requesting party via Epic fax function and remove from pre-op pool.  Please call with questions.  [name]. Sarah Haldeman NP-C     01/25/2024, 3:36 PM [facility] [address] Office [phone] Fax [phone]

## 2024-01-25 NOTE — Telephone Encounter (Signed)
 Samuel Jester, I am reaching back out to you about Ms. Hulce.  Her procedure is scheduled for 01/26/2024.  Please review and provide recommendations.  Thank you for your help.  Thomasene Ripple. Malkie Wille NP-C     01/25/2024, 12:11 PM [facility] [address] Office [phone] Fax [phone]

## 2024-01-25 NOTE — Telephone Encounter (Signed)
 [name] not sure when you reached out the first time as I have been off and another APP has been covering my inbox. There is no further testing that she would need from our standpoint to proceed with her procedure at acceptable risk. I had restarted her Eliquis at her last visit so will need to see if she is taking it or has been holding it at least two days prior to the procedure.  Thanks,  NIKE

## 2024-01-26 ENCOUNTER — Ambulatory Visit: Payer: Self-pay | Admitting: Urgent Care

## 2024-01-26 ENCOUNTER — Other Ambulatory Visit: Payer: Self-pay

## 2024-01-26 ENCOUNTER — Encounter
Admission: RE | Disposition: A | Discharge status: Home / Self Care | Payer: Self-pay | Source: Home / Self Care | Attending: Surgery

## 2024-01-26 ENCOUNTER — Encounter: Payer: Self-pay | Admitting: Surgery

## 2024-01-26 ENCOUNTER — Ambulatory Visit
Admission: RE | Admit: 2024-01-26 | Discharge: 2024-01-26 | Disposition: A | Discharge status: Home / Self Care | Attending: Surgery | Admitting: Surgery

## 2024-01-26 DIAGNOSIS — T8149XA Infection following a procedure, other surgical site, initial encounter: Secondary | ICD-10-CM | POA: Diagnosis not present

## 2024-01-26 DIAGNOSIS — J45909 Unspecified asthma, uncomplicated: Secondary | ICD-10-CM | POA: Diagnosis not present

## 2024-01-26 DIAGNOSIS — T8189XA Other complications of procedures, not elsewhere classified, initial encounter: Secondary | ICD-10-CM | POA: Insufficient documentation

## 2024-01-26 DIAGNOSIS — Z9884 Bariatric surgery status: Secondary | ICD-10-CM | POA: Diagnosis not present

## 2024-01-26 DIAGNOSIS — T182XXA Foreign body in stomach, initial encounter: Secondary | ICD-10-CM | POA: Diagnosis not present

## 2024-01-26 DIAGNOSIS — S31109A Unspecified open wound of abdominal wall, unspecified quadrant without penetration into peritoneal cavity, initial encounter: Secondary | ICD-10-CM

## 2024-01-26 DIAGNOSIS — L02211 Cutaneous abscess of abdominal wall: Secondary | ICD-10-CM | POA: Diagnosis not present

## 2024-01-26 DIAGNOSIS — L905 Scar conditions and fibrosis of skin: Secondary | ICD-10-CM | POA: Diagnosis not present

## 2024-01-26 DIAGNOSIS — I10 Essential (primary) hypertension: Secondary | ICD-10-CM | POA: Insufficient documentation

## 2024-01-26 DIAGNOSIS — M795 Residual foreign body in soft tissue: Secondary | ICD-10-CM | POA: Diagnosis not present

## 2024-01-26 DIAGNOSIS — T8183XA Persistent postprocedural fistula, initial encounter: Secondary | ICD-10-CM | POA: Insufficient documentation

## 2024-01-26 DIAGNOSIS — X58XXXA Exposure to other specified factors, initial encounter: Secondary | ICD-10-CM | POA: Insufficient documentation

## 2024-01-26 DIAGNOSIS — M7989 Other specified soft tissue disorders: Secondary | ICD-10-CM | POA: Insufficient documentation

## 2024-01-26 DIAGNOSIS — Z79899 Other long term (current) drug therapy: Secondary | ICD-10-CM | POA: Insufficient documentation

## 2024-01-26 DIAGNOSIS — I480 Paroxysmal atrial fibrillation: Secondary | ICD-10-CM | POA: Diagnosis not present

## 2024-01-26 DIAGNOSIS — K760 Fatty (change of) liver, not elsewhere classified: Secondary | ICD-10-CM | POA: Diagnosis not present

## 2024-01-26 HISTORY — PX: LAPAROTOMY: SHX154

## 2024-01-26 HISTORY — DX: Bradycardia, unspecified: R00.1

## 2024-01-26 HISTORY — DX: Long term (current) use of anticoagulants: Z79.01

## 2024-01-26 HISTORY — DX: Other ill-defined heart diseases: I51.89

## 2024-01-26 HISTORY — DX: Essential (primary) hypertension: I10

## 2024-01-26 HISTORY — PX: APPLICATION OF WOUND VAC: SHX5189

## 2024-01-26 SURGERY — LAPAROTOMY, EXPLORATORY
Anesthesia: General | Site: Abdomen

## 2024-01-26 MED ORDER — GABAPENTIN 300 MG PO CAPS
ORAL_CAPSULE | ORAL | Status: AC
  Filled 2024-01-26: qty 1

## 2024-01-26 MED ORDER — BUPIVACAINE-EPINEPHRINE (PF) 0.25% -1:200000 IJ SOLN
INTRAMUSCULAR | Status: AC
  Filled 2024-01-26: qty 30

## 2024-01-26 MED ORDER — BUPIVACAINE LIPOSOME 1.3 % IJ SUSP
INTRAMUSCULAR | Status: AC
  Filled 2024-01-26: qty 20

## 2024-01-26 MED ORDER — OXYCODONE HCL 5 MG/5ML PO SOLN: 5.0000 mg | Freq: Once | ORAL | Status: DC | PRN

## 2024-01-26 MED ORDER — FENTANYL CITRATE (PF) 100 MCG/2ML IJ SOLN
INTRAMUSCULAR | Status: DC | PRN
  Administered 2024-01-26: 50 ug via INTRAVENOUS

## 2024-01-26 MED ORDER — DEXAMETHASONE SODIUM PHOSPHATE 10 MG/ML IJ SOLN
INTRAMUSCULAR | Status: DC | PRN
  Administered 2024-01-26: 10 mg via INTRAVENOUS

## 2024-01-26 MED ORDER — PROPOFOL 10 MG/ML IV BOLUS
INTRAVENOUS | Status: DC | PRN
  Administered 2024-01-26: 150 mg via INTRAVENOUS

## 2024-01-26 MED ORDER — GLYCOPYRROLATE 0.2 MG/ML IJ SOLN
INTRAMUSCULAR | Status: DC | PRN
Start: 2024-01-26 — End: 2024-01-26
  Administered 2024-01-26: .2 mg via INTRAVENOUS

## 2024-01-26 MED ORDER — METHYLENE BLUE (ANTIDOTE) 1 % IV SOLN
INTRAVENOUS | Status: AC
  Filled 2024-01-26: qty 10

## 2024-01-26 MED ORDER — ORAL CARE MOUTH RINSE: 15.0000 mL | Freq: Once | OROMUCOSAL | Status: AC

## 2024-01-26 MED ORDER — LIDOCAINE HCL (PF) 1 % IJ SOLN
INTRAMUSCULAR | Status: DC | PRN
  Administered 2024-01-26: 50 mL

## 2024-01-26 MED ORDER — CHLORHEXIDINE GLUCONATE 0.12 % MT SOLN
15.0000 mL | Freq: Once | OROMUCOSAL | Status: AC
  Administered 2024-01-26: 15 mL via OROMUCOSAL

## 2024-01-26 MED ORDER — LACTATED RINGERS IV SOLN: INTRAVENOUS | Status: DC

## 2024-01-26 MED ORDER — SUGAMMADEX SODIUM 200 MG/2ML IV SOLN
INTRAVENOUS | Status: DC | PRN
Start: 2024-01-26 — End: 2024-01-26
  Administered 2024-01-26: 200 mg via INTRAVENOUS

## 2024-01-26 MED ORDER — ACETAMINOPHEN 500 MG PO TABS
1000.0000 mg | ORAL_TABLET | ORAL | Status: AC
  Administered 2024-01-26: 1000 mg via ORAL

## 2024-01-26 MED ORDER — 0.9 % SODIUM CHLORIDE (POUR BTL) OPTIME
TOPICAL | Status: DC | PRN
  Administered 2024-01-26: 500 mL

## 2024-01-26 MED ORDER — ACETAMINOPHEN 500 MG PO TABS
ORAL_TABLET | ORAL | Status: AC
  Filled 2024-01-26: qty 2

## 2024-01-26 MED ORDER — HYDROCODONE-ACETAMINOPHEN 5-325 MG PO TABS: 1.0000 | ORAL_TABLET | Freq: Four times a day (QID) | ORAL | 0 refills | Status: DC | PRN

## 2024-01-26 MED ORDER — CEFAZOLIN SODIUM-DEXTROSE 2-4 GM/100ML-% IV SOLN
2.0000 g | INTRAVENOUS | Status: AC
Start: 2024-01-26 — End: 2024-01-26
  Administered 2024-01-26: 2 g via INTRAVENOUS

## 2024-01-26 MED ORDER — CHLORHEXIDINE GLUCONATE 0.12 % MT SOLN
OROMUCOSAL | Status: AC
  Filled 2024-01-26: qty 15

## 2024-01-26 MED ORDER — CEFAZOLIN SODIUM-DEXTROSE 2-4 GM/100ML-% IV SOLN
INTRAVENOUS | Status: AC
  Filled 2024-01-26: qty 100

## 2024-01-26 MED ORDER — ROCURONIUM BROMIDE 100 MG/10ML IV SOLN
INTRAVENOUS | Status: DC | PRN
  Administered 2024-01-26: 50 mg via INTRAVENOUS

## 2024-01-26 MED ORDER — CHLORHEXIDINE GLUCONATE CLOTH 2 % EX PADS
6.0000 | MEDICATED_PAD | Freq: Once | CUTANEOUS | Status: DC
Start: 2024-01-26 — End: 2024-01-26

## 2024-01-26 MED ORDER — MIDAZOLAM HCL 2 MG/2ML IJ SOLN
INTRAMUSCULAR | Status: DC | PRN
  Administered 2024-01-26: 2 mg via INTRAVENOUS

## 2024-01-26 MED ORDER — PROPOFOL 1000 MG/100ML IV EMUL
INTRAVENOUS | Status: AC
  Filled 2024-01-26: qty 100

## 2024-01-26 MED ORDER — ONDANSETRON HCL 4 MG/2ML IJ SOLN
INTRAMUSCULAR | Status: DC | PRN
  Administered 2024-01-26 (×2): 4 mg via INTRAVENOUS

## 2024-01-26 MED ORDER — OXYCODONE HCL 5 MG PO TABS: 5.0000 mg | ORAL_TABLET | Freq: Once | ORAL | Status: DC | PRN

## 2024-01-26 MED ORDER — DROPERIDOL 2.5 MG/ML IJ SOLN
0.6250 mg | Freq: Once | INTRAMUSCULAR | Status: AC
  Administered 2024-01-26: 0.625 mg via INTRAVENOUS

## 2024-01-26 MED ORDER — HYDROGEN PEROXIDE 3 % EX SOLN
CUTANEOUS | Status: DC | PRN
Start: 2024-01-26 — End: 2024-01-26
  Administered 2024-01-26: 1

## 2024-01-26 MED ORDER — MIDAZOLAM HCL 2 MG/2ML IJ SOLN
INTRAMUSCULAR | Status: AC
  Filled 2024-01-26: qty 2

## 2024-01-26 MED ORDER — EPHEDRINE SULFATE-NACL 50-0.9 MG/10ML-% IV SOSY
PREFILLED_SYRINGE | INTRAVENOUS | Status: DC | PRN
  Administered 2024-01-26: 5 mg via INTRAVENOUS

## 2024-01-26 MED ORDER — METHYLENE BLUE (ANTIDOTE) 1 % IV SOLN
INTRAVENOUS | Status: DC | PRN
  Administered 2024-01-26: 2.5 mL

## 2024-01-26 MED ORDER — CHLORHEXIDINE GLUCONATE CLOTH 2 % EX PADS
6.0000 | MEDICATED_PAD | Freq: Once | CUTANEOUS | Status: AC
  Administered 2024-01-26: 6 via TOPICAL

## 2024-01-26 MED ORDER — PHENYLEPHRINE 80 MCG/ML (10ML) SYRINGE FOR IV PUSH (FOR BLOOD PRESSURE SUPPORT)
PREFILLED_SYRINGE | INTRAVENOUS | Status: DC | PRN
  Administered 2024-01-26: 80 ug via INTRAVENOUS
  Administered 2024-01-26: 160 ug via INTRAVENOUS
  Administered 2024-01-26: 80 ug via INTRAVENOUS

## 2024-01-26 MED ORDER — LIDOCAINE HCL (CARDIAC) PF 100 MG/5ML IV SOSY
PREFILLED_SYRINGE | INTRAVENOUS | Status: DC | PRN
  Administered 2024-01-26: 100 mg via INTRAVENOUS

## 2024-01-26 MED ORDER — HYDROMORPHONE HCL 1 MG/ML IJ SOLN: 0.2500 mg | INTRAMUSCULAR | Status: DC | PRN

## 2024-01-26 MED ORDER — GABAPENTIN 300 MG PO CAPS
300.0000 mg | ORAL_CAPSULE | ORAL | Status: AC
Start: 2024-01-26 — End: 2024-01-26
  Administered 2024-01-26: 300 mg via ORAL

## 2024-01-26 MED ORDER — DROPERIDOL 2.5 MG/ML IJ SOLN
INTRAMUSCULAR | Status: AC
  Filled 2024-01-26: qty 2

## 2024-01-26 MED ORDER — DEXMEDETOMIDINE HCL IN NACL 200 MCG/50ML IV SOLN
INTRAVENOUS | Status: DC | PRN
  Administered 2024-01-26: 8 ug via INTRAVENOUS

## 2024-01-26 MED ORDER — BUPIVACAINE LIPOSOME 1.3 % IJ SUSP: 20.0000 mL | Freq: Once | INTRAMUSCULAR | Status: DC

## 2024-01-26 MED ORDER — FENTANYL CITRATE (PF) 100 MCG/2ML IJ SOLN
INTRAMUSCULAR | Status: AC
  Filled 2024-01-26: qty 2

## 2024-01-26 SURGICAL SUPPLY — 44 items
APPLIER CLIP 11 MED OPEN (CLIP) IMPLANT
APPLIER CLIP 13 LRG OPEN (CLIP) IMPLANT
BLADE CLIPPER SURG (BLADE) IMPLANT
CANISTER WOUND CARE 500ML ATS (WOUND CARE) IMPLANT
CHLORAPREP W/TINT 26 (MISCELLANEOUS) ×2 IMPLANT
CLIP APPLIE 11 MED OPEN (CLIP) IMPLANT
CLIP APPLIE 13 LRG OPEN (CLIP) IMPLANT
CNTNR URN SCR LID CUP LEK RST (MISCELLANEOUS) IMPLANT
COVER PROBE FLX POLY STRL (MISCELLANEOUS) IMPLANT
DRAPE C-SECTION (MISCELLANEOUS) ×2 IMPLANT
DRAPE LAPAROTOMY 77X122 PED (DRAPES) ×2 IMPLANT
DRAPE TABLE BACK 80X90 (DRAPES) ×2 IMPLANT
DRSG VAC GRANUFOAM LG (GAUZE/BANDAGES/DRESSINGS) IMPLANT
DRSG VAC GRANUFOAM MED (GAUZE/BANDAGES/DRESSINGS) IMPLANT
ELECT BLADE 6.5 EXT (BLADE) ×2 IMPLANT
ELECT REM PT RETURN 9FT ADLT (ELECTROSURGICAL) ×2 IMPLANT
ELECTRODE REM PT RTRN 9FT ADLT (ELECTROSURGICAL) ×2 IMPLANT
GAUZE 4X4 16PLY ~~LOC~~+RFID DBL (SPONGE) IMPLANT
GEL ULTRASOUND 20GR AQUASONIC (MISCELLANEOUS) IMPLANT
GLOVE ORTHO TXT STRL SZ7.5 (GLOVE) ×4 IMPLANT
GOWN STRL REUS W/ TWL LRG LVL3 (GOWN DISPOSABLE) ×4 IMPLANT
GOWN STRL REUS W/ TWL XL LVL3 (GOWN DISPOSABLE) ×4 IMPLANT
HANDLE YANKAUER SUCT BULB TIP (MISCELLANEOUS) IMPLANT
KIT TURNOVER KIT A (KITS) ×2 IMPLANT
LIGASURE IMPACT 36 18CM CVD LR (INSTRUMENTS) IMPLANT
MANIFOLD NEPTUNE II (INSTRUMENTS) ×2 IMPLANT
NDL 18GX1X1/2 (RX/OR ONLY) (NEEDLE) IMPLANT
NEEDLE 18GX1X1/2 (RX/OR ONLY) (NEEDLE) ×2 IMPLANT
NS IRRIG 1000ML POUR BTL (IV SOLUTION) ×2 IMPLANT
PACK BASIN MAJOR ARMC (MISCELLANEOUS) ×2 IMPLANT
PACK BASIN MINOR ARMC (MISCELLANEOUS) ×2 IMPLANT
PACK COLON CLEAN CLOSURE (MISCELLANEOUS) IMPLANT
SOL PREP PVP 2OZ (MISCELLANEOUS) IMPLANT
SOLUTION PREP PVP 2OZ (MISCELLANEOUS) ×2 IMPLANT
SPONGE T-LAP 18X18 ~~LOC~~+RFID (SPONGE) IMPLANT
STAPLER SKIN PROX 35W (STAPLE) ×2 IMPLANT
SUT PDS AB 1 CT 36 (SUTURE) ×6 IMPLANT
SUT SILK 3 0 SH CR/8 (SUTURE) ×2 IMPLANT
SUT VICRYL 2-0 54IN ABS (SUTURE) IMPLANT
SWAB CULTURE AMIES ANAERIB BLU (MISCELLANEOUS) IMPLANT
SYR 20ML LL LF (SYRINGE) IMPLANT
TRAP FLUID SMOKE EVACUATOR (MISCELLANEOUS) ×2 IMPLANT
TRAY FOLEY MTR SLVR 16FR STAT (SET/KITS/TRAYS/PACK) ×2 IMPLANT
WATER STERILE IRR 500ML POUR (IV SOLUTION) ×2 IMPLANT

## 2024-01-26 NOTE — Op Note (Signed)
 Abdominal wound exploration, removal of the suture foreign body, excisional debridement of skin and subcutaneous tissues, placement of negative pressure wound therapy dressing, ultrasound of right lower quadrant abdominal wall, attempted aspiration of previously reported seroma.  Pre-operative Diagnosis: Chronic/recurrent nonhealing midline abdominal wound, with history of abscess and permanent suture foreign body.  Questionably symptomatic abdominal wall seroma involving prior panniculectomy incision right lower quadrant.  Post-operative Diagnosis: same.    Surgeon: Campbell Lerner, M.D., FACS  Anesthesia: General  Findings: Loose blue-colored locking suture material within epigastric midline wound cavity.  No other foreign bodies appreciated in similar wound.  Hypoechoic area in right lower quadrant, without fluid available for aspiration with 18-gauge needle.  Estimated Blood Loss: 10 mL         Specimens: Midline cicatrix, permanent suture foreign body.          Complications: none              Procedure Details  The patient was seen again in the Holding Room. The benefits, complications, treatment options, and expected outcomes were discussed with the patient. The risks of bleeding, infection, recurrence of symptoms, failure to resolve symptoms, unanticipated injury, prosthetic placement, prosthetic infection, any of which could require further surgery were reviewed with the patient. The likelihood of improving the patient's symptoms with return to their baseline status is anticipated.  The patient and/or family concurred with the proposed plan, giving informed consent.  The patient was taken to Operating Room, identified and the procedure verified.    Prior to the induction of general anesthesia, antibiotic prophylaxis was administered. VTE prophylaxis was in place.  General anesthesia was then administered and tolerated well.  I took an ultrasound probe and confirmed the hypoechoic area  in the right lower quadrant which appeared consistent with a seroma. After the induction, the patient was positioned in the supine position and the abdomen and proximal thighs was prepped with Chloraprep and draped in the sterile fashion.  A Time Out was held and the above information confirmed. Utilizing the ultrasound probe guidance, I placed 18-gauge needle into the hypoechoic space noted on imaging, multiple attempts at aspiration did not provide any fluid.  Suspecting the fluid present is of such high viscosity that it would not aspirate.  No further attempts to address this area were made.  I then utilized a 12-gauge Angiocath, and instilled a mixture of methylene blue and hydroperoxide into the known sinus tract cephalad to the umbilicus.  This fluid was injected to fill the cavity present and potentially identify any further tunneling or tracking away from this area. I then utilized electrosurgery and made an elliptical incision to excise that sinus tract and followed it down to the deep cavity.  I unroofed this cavity without trying to attempt full excision. I utilized a bone curette to excise the hypergranulation tissue, and carefully explored the wound.  I identified a loose free locking blue-colored suture of approximately 4 cm. Identified 2 small recesses deep to the scar, but uncertain to the degree of relationship to the underlying fascia.  These were both along the midline incision to the cephalad and caudad aspects.  The extended and a small 3 mm tunnel to a length of 20 mm both cephalad and caudad.  These were explored with bone curette, they were limited to this degree, there was no additional foreign body present within this tunnel.  It was ensured complete drainage from this area and no residual hypergranulation tissue to be present there.  This  limited the extent of this midline wound.  I then measured the main portion of the wound at the completion of the procedure which measured 62 mm  craniocaudad, and 30 mm from left to right with a depth of about 32 mm. Local infiltration of Exparel mixed with quarter percent Marcaine with epinephrine was utilized to the perimeter of the wound, following irrigation of the wound. A wound VAC was then utilized, the gray foam was cut to size, secured with seal and suction applicator Applied and a good seal was confirmed.  This completed the procedure.     Campbell Lerner M.D., [facility] 01/26/2024 9:23 AM

## 2024-01-26 NOTE — Anesthesia Procedure Notes (Signed)
 Procedure Name: Intubation Date/Time: 01/26/2024 7:59 AM  Performed by: Mohammed Kindle, CRNAPre-anesthesia Checklist: Patient identified, Emergency Drugs available, Suction available and Patient being monitored Patient Re-evaluated:Patient Re-evaluated prior to induction Oxygen Delivery Method: Circle system utilized Preoxygenation: Pre-oxygenation with 100% oxygen Induction Type: IV induction Ventilation: Mask ventilation without difficulty Laryngoscope Size: McGrath and 3 Grade View: Grade I Tube type: Oral Tube size: 6.5 mm Number of attempts: 1 Airway Equipment and Method: Stylet and Oral airway Placement Confirmation: ETT inserted through vocal cords under direct vision, positive ETCO2 and breath sounds checked- equal and bilateral Secured at: 21 cm Tube secured with: Tape Dental Injury: Teeth and Oropharynx as per pre-operative assessment

## 2024-01-26 NOTE — Transfer of Care (Signed)
 Immediate Anesthesia Transfer of Care Note  Patient: [name]  Procedure(s) Performed: LAPAROTOMY, EXPLORATORY of abdominal wall wounds, for removal of foreign body; ultrasound-guided aspiratio of seroma (Abdomen) APPLICATION, WOUND VAC  Patient Location: PACU  Anesthesia Type:General  Level of Consciousness: awake, drowsy, and patient cooperative  Airway & Oxygen Therapy: Patient Spontanous Breathing and Patient connected to face mask oxygen  Post-op Assessment: Report given to RN and Post -op Vital signs reviewed and stable  Post vital signs: Reviewed and stable  Last Vitals:  Vitals Value Taken Time  BP 125/68 01/26/24 0915  Temp 36.1 C 01/26/24 0910  Pulse 83 01/26/24 0924  Resp 16 01/26/24 0924  SpO2 100 % 01/26/24 0924  Vitals shown include unfiled device data.  Last Pain:  Vitals:   01/26/24 0915  TempSrc:   PainSc: Asleep         Complications: No notable events documented.

## 2024-01-26 NOTE — TOC Progression Note (Addendum)
 Transition of Care [facility]) - Progression Note    Patient Details  Name: [name] MRN: [id number] Date of Birth: 07/11/1966  Transition of Care [facility]) CM/SW Contact  Marlowe Sax, RN Phone Number: 01/26/2024, 10:47 AM  Clinical Narrative:     Reached out to [facility], [facility], [facility] And none of the [facility] agencies can accept the patient for wound vac change   I notified the physician of this    Expected Discharge Plan and Services         Expected Discharge Date: 01/26/24                                     Social Determinants of Health (SDOH) Interventions SDOH Screenings   Food Insecurity: No Food Insecurity (09/29/2023)  Housing: Low Risk  (09/29/2023)  Transportation Needs: No Transportation Needs (09/29/2023)  Utilities: Not At Risk (09/29/2023)  Depression (PHQ2-9): Low Risk  (01/05/2024)  Tobacco Use: Low Risk  (01/26/2024)    Readmission Risk Interventions     No data to display

## 2024-01-26 NOTE — Anesthesia Preprocedure Evaluation (Signed)
 Anesthesia Evaluation  Patient identified by MRN, date of birth, ID band Patient awake    Reviewed: Allergy & Precautions, NPO status , Patient's Chart, lab work & pertinent test results  History of Anesthesia Complications Negative for: history of anesthetic complications  Airway Mallampati: II  TM Distance: >3 FB Neck ROM: full    Dental no notable dental hx.    Pulmonary neg pulmonary ROS   Pulmonary exam normal        Cardiovascular hypertension, On Medications + dysrhythmias Atrial Fibrillation      Neuro/Psych negative neurological ROS  negative psych ROS   GI/Hepatic negative GI ROS,,,(+) Cirrhosis         Endo/Other  negative endocrine ROS    Renal/GU      Musculoskeletal   Abdominal   Peds  Hematology negative hematology ROS (+)   Anesthesia Other Findings Past Medical History: No date: Asthma due to seasonal allergies No date: Bradycardia No date: Diastolic dysfunction No date: Dysplasia of cervix, low grade (CIN 1) No date: Hiatal hernia     Comment:  a.) s/p repair concurrently with biliopancreatic               diversion with duodenal switch procedure in 2017 No date: History of diabetes mellitus, type II     Comment:  a.) resolved after bariatric surgery done in 2017 No date: History of hiatal hernia No date: History of kidney stones No date: History of obstructive sleep apnea     Comment:  a.) resolved after bariatric surgery done in 2017; has               had repeat PSG testing that was negative since surgery No date: HTN (hypertension) No date: Hydronephrosis, right No date: Hyperlipidemia No date: Iron deficiency anemia No date: MRSA carrier No date: Multinodular goiter No date: NAFL (nonalcoholic fatty liver) No date: Nephrolithiasis 09/29/2023: New onset atrial fibrillation (HCC)     Comment:  a.) Dx'd 09/2023; b.) CHA2DS2VASc = 3 (sex, HTN,  T2DM)               as of  01/24/2024; b.) rate/rhythm maintained               intrinsically without need for pharmacological               intervention; chronically anticoagulated with apixaban No date: On apixaban therapy No date: Osteoporosis No date: Primary biliary cirrhosis (HCC) No date: Skin cancer of nose 08/03/2016: Status post biliopancreatic diversion with duodenal switch No date: Wears glasses  Past Surgical History: 07/2021: AUGMENTATION MAMMAPLASTY; Bilateral     Comment:  Implants and lift 07/05/2022: AUGMENTATION MAMMAPLASTY; Bilateral     Comment:  Dr Kizzie Bane, replaced implants 08/03/2016: BARIATRIC SURGERY     Comment:  LAPAROSCOPY DUODENAL SWITCH AND HIATAL HERNIA REPAIR;               Dr. Smitty Cords @[facility] , [place] No date: BRACHIOPLASTY; Bilateral 2017: BREAST BIOPSY; Right     Comment:  FIBROCYSTIC CHANGES WITH CALCIFICATIONS No date: CESAREAN SECTION     Comment:  x 2 09/11/2019: COLONOSCOPY WITH PROPOFOL; N/A     Comment:  Procedure: COLONOSCOPY WITH PROPOFOL;  Surgeon: Wyline Mood, MD;  Location: [facility] ENDOSCOPY;  Service:               Gastroenterology;  Laterality: N/A; 02/06/2022: CYSTOSCOPY WITH RETROGRADE PYELOGRAM, URETEROSCOPY AND  STENT PLACEMENT; Right 09/10/2017: CYSTOSCOPY WITH STENT PLACEMENT; Right     Comment:  Procedure: CYSTOSCOPY WITH STENT PLACEMENT;  Surgeon:               Riki Altes, MD;  Location: [facility] ORS;  Service:               Urology;  Laterality: Right; 04/24/2019: CYSTOSCOPY/URETEROSCOPY/HOLMIUM LASER/STENT PLACEMENT;  Right     Comment:  Procedure: CYSTOSCOPY/URETEROSCOPY/STENT PLACEMENT/               RIGHT RETROGRADE;  Surgeon: Jerilee Field, MD;                Location: [facility];  Service: Urology;               Laterality: Right; 10/28/2017: DX LAPAROSCOPY/  EGD/  HIATAL HERNIA REPAIR CLOSURE OF  INTERNAL MENSENTRY DEFECT     Comment:  @[facility] , [place] 01/30/2019: EXTRACORPOREAL SHOCK WAVE LITHOTRIPSY; N/A      Comment:  Procedure: EXTRACORPOREAL SHOCK WAVE LITHOTRIPSY (ESWL)               LEFT/ POSSIBLE RIGHT;  Surgeon: Jerilee Field, MD;                Location: [facility] ORS;  Service: Urology;  Laterality: N/A; 03/20/2019: EXTRACORPOREAL SHOCK WAVE LITHOTRIPSY; Right     Comment:  Procedure: EXTRACORPOREAL SHOCK WAVE LITHOTRIPSY (ESWL);              Surgeon: Marcine Matar, MD;  Location: [facility] ORS;                Service: Urology;  Laterality: Right; 10/30/2021: EXTRACORPOREAL SHOCK WAVE LITHOTRIPSY; Left     Comment:  Procedure: EXTRACORPOREAL SHOCK WAVE LITHOTRIPSY (ESWL);              Surgeon: Crist Fat, MD;  Location: [facility];  Service: Urology;  Laterality: Left; 10/15/2016: LAPAROSCOPIC CHOLECYSTECTOMY     Comment:  @[facility]  , [place] No date: LIPOSUCTION No date: PANNICULECTOMY     Comment:  2022 child: TONSILLECTOMY AND ADENOIDECTOMY 09/10/2017: URETEROSCOPY WITH HOLMIUM LASER LITHOTRIPSY; Right     Comment:  Procedure: URETEROSCOPY WITH HOLMIUM LASER LITHOTRIPSY;               Surgeon: Riki Altes, MD;  Location: [facility] ORS;                Service: Urology;  Laterality: Right;  BMI    Body Mass Index: 27.03 kg/m      Reproductive/Obstetrics negative OB ROS                              Anesthesia Physical Anesthesia Plan  ASA: 3  Anesthesia Plan: General ETT   Post-op Pain Management: Toradol IV (intra-op)*, Tylenol PO (pre-op)*, Gabapentin PO (pre-op)*, Dilaudid IV and Ketamine IV*   Induction: Intravenous  PONV Risk Score and Plan: 3 and Ondansetron, Dexamethasone, Midazolam and Treatment may vary due to age or medical condition  Airway Management Planned: Oral ETT  Additional Equipment:   Intra-op Plan:   Post-operative Plan: Extubation in OR  Informed Consent: I have reviewed the patients History and Physical, chart, labs and discussed the procedure including the risks, benefits and  alternatives for the proposed anesthesia with the patient or authorized representative who has indicated  his/her understanding and acceptance.     Dental Advisory Given  Plan Discussed with: Anesthesiologist, CRNA and Surgeon  Anesthesia Plan Comments: (Patient consented for risks of anesthesia including but not limited to:  - adverse reactions to medications - damage to eyes, teeth, lips or other oral mucosa - nerve damage due to positioning  - sore throat or hoarseness - Damage to heart, brain, nerves, lungs, other parts of body or loss of life  Patient voiced understanding and assent.)         Anesthesia Quick Evaluation

## 2024-01-26 NOTE — TOC Progression Note (Addendum)
 Transition of Care [facility]) - Progression Note    Patient Details  Name: [name] MRN: [id number] Date of Birth: Apr 18, 1966  Transition of Care [facility]) CM/SW Contact  Marlowe Sax, RN Phone Number: 01/26/2024, 9:56 AM  Clinical Narrative:    Sherron Monday with Renee Harder at [facility] and provided the referral for RN for Wound vac change, He called and stated that they can not accept the patient      Expected Discharge Plan and Services         Expected Discharge Date: 01/26/24                                     Social Determinants of Health (SDOH) Interventions SDOH Screenings   Food Insecurity: No Food Insecurity (09/29/2023)  Housing: Low Risk  (09/29/2023)  Transportation Needs: No Transportation Needs (09/29/2023)  Utilities: Not At Risk (09/29/2023)  Depression (PHQ2-9): Low Risk  (01/05/2024)  Tobacco Use: Low Risk  (01/26/2024)    Readmission Risk Interventions     No data to display

## 2024-01-26 NOTE — Interval H&P Note (Signed)
 History and Physical Interval Note:  01/26/2024 7:41 AM  [name]  has presented today for surgery, with the diagnosis of open wound of abdominal wall.  The various methods of treatment have been discussed with the patient and family. After consideration of risks, benefits and other options for treatment, the patient has consented to  Procedure(s): LAPAROTOMY, EXPLORATORY (N/A) DEBRIDEMENT, SKIN, FULL-THICKNESS (N/A) APPLICATION, WOUND VAC (N/A) as a surgical intervention.  The patient's history has been reviewed, patient examined, no change in status, stable for surgery.  I have reviewed the patient's chart and labs.  Questions were answered to the patient's satisfaction.   Extensive discussion outlining the goals of care and the objectives of today's operation.  Will evaluate seromas, potentially aspirate, explore recently draining wounds and removal of foreign bodies, and likely apply NPWT to assist in the healing process.  I do not anticipate entering the abdominal cavity.  Wound healing is the goal, and with multiple questions answered, no guarantees were ever expressed or implied.   [name]

## 2024-01-27 ENCOUNTER — Encounter: Payer: Self-pay | Admitting: Surgery

## 2024-01-27 LAB — SURGICAL PATHOLOGY

## 2024-01-27 NOTE — Anesthesia Postprocedure Evaluation (Signed)
 Anesthesia Post Note  Patient: [name]  Procedure(s) Performed: LAPAROTOMY, EXPLORATORY of abdominal wall wounds, for removal of foreign body; ultrasound-guided aspiratio of seroma (Abdomen) APPLICATION, WOUND VAC  Patient location during evaluation: PACU Anesthesia Type: General Level of consciousness: awake and alert Pain management: pain level controlled Vital Signs Assessment: post-procedure vital signs reviewed and stable Respiratory status: spontaneous breathing, nonlabored ventilation, respiratory function stable and patient connected to nasal cannula oxygen Cardiovascular status: blood pressure returned to baseline and stable Postop Assessment: no apparent nausea or vomiting Anesthetic complications: no   No notable events documented.   Last Vitals:  Vitals:   01/26/24 1031 01/26/24 1144  BP: 124/75 103/67  Pulse: 88 64  Resp: 20 20  Temp: (!) 36.1 C   SpO2: 97% 97%    Last Pain:  Vitals:   01/26/24 1031  TempSrc: Temporal  PainSc: 0-No pain                 Louie Boston

## 2024-01-28 ENCOUNTER — Encounter: Payer: Self-pay | Admitting: Physician Assistant

## 2024-01-28 ENCOUNTER — Ambulatory Visit (INDEPENDENT_AMBULATORY_CARE_PROVIDER_SITE_OTHER): Admitting: Physician Assistant

## 2024-01-28 VITALS — BP 130/76 | HR 83 | Ht 66.5 in | Wt 168.0 lb

## 2024-01-28 DIAGNOSIS — S31109D Unspecified open wound of abdominal wall, unspecified quadrant without penetration into peritoneal cavity, subsequent encounter: Secondary | ICD-10-CM | POA: Diagnosis not present

## 2024-01-28 DIAGNOSIS — S31109A Unspecified open wound of abdominal wall, unspecified quadrant without penetration into peritoneal cavity, initial encounter: Secondary | ICD-10-CM

## 2024-01-28 DIAGNOSIS — Z09 Encounter for follow-up examination after completed treatment for conditions other than malignant neoplasm: Secondary | ICD-10-CM

## 2024-01-28 NOTE — Progress Notes (Signed)
 [facility] POST-OP OFFICE VISIT  01/28/2024  HPI: [name] is a 58 y.o. female 2 days s/p abdominal wound exploration, removal of the suture foreign body, excisional debridement of skin and subcutaneous tissues, placement of negative pressure wound therapy dressing with Dr Claudine Mouton  She is doing well Abdomen is sore expectedly No fever, chills She has managed vac without issue No other complaints   Vital signs: Ht 5' 6.5" (1.689 m)   Wt 168 lb (76.2 kg)   LMP 07/11/2016 (Exact Date)   BMI 26.71 kg/m    Physical Exam: Constitutional: Well appearing female, NAD Abdomen: Soft, expected soreness, non-distended, no rebound/guarding Skin: 6 x 4 x 3 cm to epigastrium, superior to umbilicus, wound bed appears healthy, starting to granulate. There was punctate ooze from the skin. No erythema, no drainage. Wound vac placed without issue.    Assessment/Plan: This is a 58 y.o. female 2 days s/p abdominal wound exploration, removal of the suture foreign body, excisional debridement of skin and subcutaneous tissues, placement of negative pressure wound therapy dressing with Dr Claudine Mouton   - Wound vac placed to epigastric wound without issue. Wound measured 6 x 4 x 3 cm. Black foam utilized and seal obtained to -125 mmHg.   - Pain control prn  - Okay to return to work with precautions  - Reviewed surgical pathology  - She will follow up on Tuesday 03/25 for vac change with Dr Claudine Mouton; She understands to call with questions/concerns in the interim.  I will be happy to see patient as needed for vac changes as well.   -- Lynden Oxford, PA-C [facility] 01/28/2024, 11:12 AM M-F: 7am - 4pm

## 2024-01-28 NOTE — Patient Instructions (Signed)
 Wound vac changes twice a week. Take your Ibuprofen 30 minutes before your appointment.    Please call and ask to speak with a nurse if you develop questions or concerns.

## 2024-01-31 LAB — AEROBIC/ANAEROBIC CULTURE W GRAM STAIN (SURGICAL/DEEP WOUND)

## 2024-02-01 ENCOUNTER — Encounter: Payer: Self-pay | Admitting: Surgery

## 2024-02-01 ENCOUNTER — Ambulatory Visit (INDEPENDENT_AMBULATORY_CARE_PROVIDER_SITE_OTHER): Admitting: Surgery

## 2024-02-01 VITALS — BP 162/77 | HR 84 | Temp 98.6°F | Ht 66.5 in | Wt 172.0 lb

## 2024-02-01 DIAGNOSIS — S31109D Unspecified open wound of abdominal wall, unspecified quadrant without penetration into peritoneal cavity, subsequent encounter: Secondary | ICD-10-CM

## 2024-02-01 DIAGNOSIS — S31109A Unspecified open wound of abdominal wall, unspecified quadrant without penetration into peritoneal cavity, initial encounter: Secondary | ICD-10-CM

## 2024-02-01 NOTE — Patient Instructions (Signed)
Negative Pressure Wound Therapy Home Guide Negative pressure wound therapy (NPWT) uses a sponge or foam-like material (dressing) placed on or inside the wound. The wound is then covered and sealed with a cover dressing that sticks to your skin (is adhesive) to keep air out. A tube is attached to the cover dressing, and this tube connects to a small pump. The pump removes drainage from the wound. NPWT helps to increase blood flow to the wound and heal it from the inside. NPWT also helps pull the edges of the wound together and removes fluids and germs from the wound. NPWT may also be called wound vac. What are the risks? NPWT is usually safe to use. However, problems can occur, including: Skin irritation from the dressing adhesive. Bleeding. Infection. Signs of infection include: More redness, swelling, or pain. More fluid or blood. Warmth or hardness around the wound. Pus or a bad smell. Red streaks leading from the wound. Dehydration. Wounds with large amounts of drainage can cause excessive body fluid loss. Pain. Supplies needed: Wound cleanser or salt-water solution (saline). Skin protectant. This may be a wipe, film, or spray. Clean or germ-free (sterile) scissors. New sponge or foam. Cover dressing. Gauze pad. Tape. Also have available: Soap and water, or hand sanitizer. Disposable gloves. Eye protection. A disposable garbage bag. Protective clothing. How to change your dressing Change the dressing as scheduled, if the dressing loses suction, or the pump is off for 2 hours or more. Prepare to change your dressing  Take pain medicine 30 minutes before changing the dressing if told by your health care provider. Wash your hands with soap and water for at least 20 seconds before you change the dressing. If soap and water are not available, use hand sanitizer Dry your hands with a clean towel. Set up a clean station for wound care and set a plastic bag on or near your work surface  for trash. Open the dressing package so that the sponge dressing remains on the inside of the package. Wear gloves, protective clothing, and eye protection. Remove the old dressing  Turn off the pump and disconnect the tubing from the dressing. Carefully remove the adhesive cover dressing in the direction of your hair growth. Remove the sponge dressing that is inside the wound. If the sponge sticks, use a wound cleanser or saline solution to wet the sponge and help it come off more easily. Throw the old sponge and cover dressing supplies into the garbage bag. Check the wound for signs of infection, including a bad smell, drainage (bleeding or pus), or new color changes (black, grey, yellow, or white) in the wound. Remove your gloves by grabbing the cuff and turning the glove inside out. Place the gloves in the trash immediately. Wash your hands with soap and water for at least 20 seconds. If soap and water are not available, use hand sanitizer. Dry your hands with a clean towel. Clean your wound Wear gloves, protective clothing, and eye protection. Follow your health care provider's instructions on how to clean your wound. You may be told to: Clean the wound using a saline solution or a wound cleanser and a clean gauze pad. Pat the wound dry with a gauze pad. Do not rub the wound. Throw the gauze pad into the garbage bag. Remove your gloves by grabbing the cuff and turning the glove inside out. Place the gloves in the trash immediately. Wash your hands with soap and water for at least 20 seconds. If soap and  water are not available, use hand sanitizer Dry your hands with a clean towel. Apply the new dressing Wear gloves, protective clothing, and eye protection. If told by your health care provider, apply a skin protectant to any skin that will be exposed to adhesive. Let the skin protectant dry. Cut a piece of new sponge dressing and put it on or in the wound. Using clean scissors, cut a  nickel-sized hole in the new cover dressing. Apply the cover dressing. Attach the suction tube over the hole in the cover dressing. Take off your gloves. Put them in the plastic bag with the old dressing. Tie the bag shut and throw it away. Wash your hands with soap and water for at least 20 seconds. If soap and water are not available, use hand sanitizer. Dry your hands with a clean towel. Turn the pump back on. The sponge dressing should collapse. Do not change the settings on the machine without talking to a health care provider. Replace the container in the pump that collects fluid if it is full. Replace the container per the manufacturer's instructions or at least once a week, even if it is not full. Follow your health care provider'sinstructions on how often you need to change and apply the new NPWT dressing. General tips and recommendations If the alarm sounds: Stay calm and prepare to troubleshoot. Do not turn off the pump or do anything with the dressing. The alarm may go off for many reasons, including: The battery is low. Change the battery or plug the device into electrical power. The dressing has a leak. Find the leak and put tape over the leak. The fluid collection container is full. Change the fluid container. Call your health care provider right away if you cannot fix the problem. Explain to your health care provider what is happening. Follow his or her instructions. General instructions Change the dressing as scheduled, if the dressing loses suction, or the pump is off for 2 hours or more. Do not turn off the pump unless told to do so by your health care provider. Do not turn off the pump for more than 2 hours. If the pump is off or you lose suction to the dressing for more than 2 hours, the dressing will need to be changed. Check the machine frequently to make sure that therapy is on and that all clamps are open. Do not use over-the-counter medicated or antiseptic creams,  sprays, liquids, or dressings unless told by your health care provider. Do not take baths, swim, or use a hot tub until your health care provider approves. You may only be allowed to take sponge baths. Ask your health care provider if you may take showers. If your health care provider says it is okay to shower: Do not take the pump into the shower. Make sure the wound dressing is protected and sealed. The wound dressing must stay dry. Contact a health care provider if: You have new pain. You develop irritation, a rash, or itching around the wound or dressing. You see new color changes (black, grey, yellow, or white) in the wound. The dressing changes are painful or cause bleeding. The pump has been off for more than 2 hours, and you do not know how to change the dressing. The pump alarm goes off, and you do not know what to do. Get help right away if: You have a lot of bleeding. The wound breaks open. You have severe pain. You have signs of infection, such as:  More redness, swelling, or pain. More fluid or blood. Warmth or hardness. Pus or a bad smell. Red streaks leading from the wound. A fever. You see a sudden change in the color or texture of the drainage. You have signs of dehydration, such as: Little or no tears, urine, or sweat. Muscle cramps. Very dry mouth. Headache. Dizziness or confusion. Summary NPWT uses a sponge or dressing placed on or inside the wound. NPWT helps to increase blood flow to the wound and heal it from the inside. Follow your health care provider's instructions on how to clean your wound and how to change the dressing. Contact a health care provider if you have new pain, an irritation, or a rash, or if the alarm goes off and you do not know what to do. Get help right away if you have a lot of bleeding, your wound breaks open, or you have severe pain. Also, get help if you have signs of infection. This information is not intended to replace advice given  to you by your health care provider. Make sure you discuss any questions you have with your health care provider. Document Revised: 03/24/2021 Document Reviewed: 03/24/2021 Elsevier Patient Education  2024 [employer].

## 2024-02-01 NOTE — Progress Notes (Signed)
 [facility] POST-OP OFFICE VISIT  02/01/2024  HPI: [name] is a 58 y.o. female had surgery on January 26, 2024, now s/p opening of midline wound exploration and removal of residual permanent suture.  Application of negative pressure wound therapy.  Reapplication 4 days ago on Friday.  Wound measurements at that time were 6 x 4 x 3 cm.  She tolerated negative pressure wound therapy quite well.  Here for reapplication.  Vital signs: BP (!) 162/77   Pulse 84   Temp 98.6 F (37 C) (Oral)   Ht 5' 6.5" (1.689 m)   Wt 172 lb (78 kg)   LMP 07/11/2016 (Exact Date)   SpO2 97%   BMI 27.35 kg/m    Physical Exam: Constitutional: She appears well. Abdomen: No erythema, wound VAC in place. Skin: Surrounding skin is normal, no erythema, no induration.  The abdominal wound subcutaneous tissues are velvety granular tissue.  The deep tissues are somewhat shiny as though they had been somewhat sequestered away from the actual sponge.  There is no significant soupy discharge, expected minimal amount of wound exudate present.  With application of 2% Xylocaine jelly, I utilized a bone curette to perform an excisional debridement of the skin and subcutaneous tissues of this wound all the way down to the abdominal wound fascia.  The wound measures 61 mm, 38 mm in width, with 22 mm in depth.  I did say it is 95% granulated.  Assessment/Plan: This is a 58 y.o. female status post laying open of chronic midline abdominal wound/abscess with removal of residual permanent foreign body/suture, application of wound VAC.  Currently in midst of negative pressure wound therapy.  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   Paroxysmal atrial fibrillation with RVR (HCC) 09/29/2023   Open abdominal wall wound 09/29/2023   Hypokalemia 09/29/2023   Near syncope 09/29/2023   Left lower quadrant  abdominal pain 06/03/2023   Wound dehiscence, surgical, sequela 05/10/2023   Poison ivy 05/03/2023   Overeating 02/22/2023   Abnormal brain MRI 02/22/2023   Tremor 01/11/2023   Abdominal hernia 09/18/2022   Abdominal distention 01/09/2022   Anemia 07/01/2021   Dysuria 07/01/2021   Dyspareunia, female 06/30/2021   Low back pain 12/09/2020   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   Headache 07/05/2020   Osteomalacia 12/27/2019   History of weight loss 11/28/2019   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   Localized osteoporosis without current pathological fracture 06/26/2019   Hypocalcemia 06/26/2019   Secondary hyperparathyroidism (HCC) 06/26/2019   Multinodular goiter 06/26/2019   Dysphagia 05/05/2019   Osteoporosis 05/03/2019   Memory loss 02/13/2019   Elevated liver function tests 10/13/2017   Epigastric abdominal pain 10/13/2017   History of kidney stones 10/13/2017   Hypertension 10/13/2017   Status post bariatric surgery 09/03/2017   Right ureteral calculus 08/31/2017   Hydronephrosis with urinary obstruction due to ureteral calculus 08/31/2017   Nephrolithiasis 08/31/2017   Screen for STD (sexually transmitted disease) 07/26/2017   Sleep apnea 10/06/2016   Candidal intertrigo 09/21/2016   H/O gastric bypass 09/21/2016   Hiatal hernia 04/21/2016   Elevated liver enzymes 09/04/2014   Vitamin D deficiency 09/03/2014   Routine general medical examination at a health care facility 01/11/2014   T2DM (type 2 diabetes mellitus) (HCC) 01/11/2014    -Will continue twice weekly wound VAC changes here in the office, and lieu of having any assistance  at home.  When requested how long this may take to heal, she is making good progress we will not have to use the wound VAC as long as she has a wound, once we achieve diminishment in depth with good granulation tissue we may be able to consider alternatives at that time.  I will see her weekly and appreciate Mr. Manus Rudd help with  changing it on Fridays.   Campbell Lerner M.D., FACS 02/01/2024, 11:04 AM

## 2024-02-03 ENCOUNTER — Encounter: Admitting: Surgery

## 2024-02-04 ENCOUNTER — Encounter: Payer: Self-pay | Admitting: Physician Assistant

## 2024-02-04 ENCOUNTER — Ambulatory Visit: Admitting: Physician Assistant

## 2024-02-04 VITALS — BP 146/79 | HR 69 | Temp 98.4°F | Ht 66.5 in | Wt 172.2 lb

## 2024-02-04 DIAGNOSIS — S31109D Unspecified open wound of abdominal wall, unspecified quadrant without penetration into peritoneal cavity, subsequent encounter: Secondary | ICD-10-CM

## 2024-02-04 DIAGNOSIS — Z09 Encounter for follow-up examination after completed treatment for conditions other than malignant neoplasm: Secondary | ICD-10-CM

## 2024-02-04 DIAGNOSIS — S31109A Unspecified open wound of abdominal wall, unspecified quadrant without penetration into peritoneal cavity, initial encounter: Secondary | ICD-10-CM

## 2024-02-04 NOTE — Progress Notes (Signed)
 [facility] POST-OP OFFICE VISIT  02/04/2024  HPI: Nara Paternoster is a 58 y.o. female 9 days s/p abdominal wound exploration, removal of the suture foreign body, excisional debridement of skin and subcutaneous tissues, placement of negative pressure wound therapy dressing with Dr Claudine Mouton   She is doing well Some increased soreness with vac changes No fever, chills No other complaints   Vital signs: BP (!) 146/79   Pulse 69   Temp 98.4 F (36.9 C) (Oral)   Ht 5' 6.5" (1.689 m)   Wt 172 lb 3.2 oz (78.1 kg)   LMP 07/11/2016 (Exact Date)   SpO2 96%   BMI 27.38 kg/m    Physical Exam: Constitutional: Well appearing female, NAD Abdomen: Soft, non-tender, non-distended, no rebound/guarding Skin: 5 x 3.5 x 2 cm to epigastrium, superior to umbilicus, wound bed appears healthy, granulating. There was punctate ooze from the wound bed with removal of sponge. No erythema, no drainage. Wound vac placed without issue.   Assessment/Plan: This is a 58 y.o. female abdominal wound exploration, removal of the suture foreign body, excisional debridement of skin and subcutaneous tissues, placement of negative pressure wound therapy dressing with Dr Claudine Mouton               - Wound vac placed to epigastric wound without issue. Wound measured 5 x 3.5 x 2 cm. Black foam utilized and seal obtained to -125 mmHg.              - She will follow up on Tuesdays with Dr Claudine Mouton - I will be happy to continue to assist on Fridays as available - She understands to call with questions/concerns in the interim.  -- Lynden Oxford, PA-C [facility] 02/04/2024, 11:07 AM M-F: 7am - 4pm

## 2024-02-04 NOTE — Patient Instructions (Signed)
Negative Pressure Wound Therapy Home Guide Negative pressure wound therapy (NPWT) uses a sponge or foam-like material (dressing) placed on or inside the wound. The wound is then covered and sealed with a cover dressing that sticks to your skin (is adhesive) to keep air out. A tube is attached to the cover dressing, and this tube connects to a small pump. The pump removes drainage from the wound. NPWT helps to increase blood flow to the wound and heal it from the inside. NPWT also helps pull the edges of the wound together and removes fluids and germs from the wound. NPWT may also be called wound vac. What are the risks? NPWT is usually safe to use. However, problems can occur, including: Skin irritation from the dressing adhesive. Bleeding. Infection. Signs of infection include: More redness, swelling, or pain. More fluid or blood. Warmth or hardness around the wound. Pus or a bad smell. Red streaks leading from the wound. Dehydration. Wounds with large amounts of drainage can cause excessive body fluid loss. Pain. Supplies needed: Wound cleanser or salt-water solution (saline). Skin protectant. This may be a wipe, film, or spray. Clean or germ-free (sterile) scissors. New sponge or foam. Cover dressing. Gauze pad. Tape. Also have available: Soap and water, or hand sanitizer. Disposable gloves. Eye protection. A disposable garbage bag. Protective clothing. How to change your dressing Change the dressing as scheduled, if the dressing loses suction, or the pump is off for 2 hours or more. Prepare to change your dressing  Take pain medicine 30 minutes before changing the dressing if told by your health care provider. Wash your hands with soap and water for at least 20 seconds before you change the dressing. If soap and water are not available, use hand sanitizer Dry your hands with a clean towel. Set up a clean station for wound care and set a plastic bag on or near your work surface  for trash. Open the dressing package so that the sponge dressing remains on the inside of the package. Wear gloves, protective clothing, and eye protection. Remove the old dressing  Turn off the pump and disconnect the tubing from the dressing. Carefully remove the adhesive cover dressing in the direction of your hair growth. Remove the sponge dressing that is inside the wound. If the sponge sticks, use a wound cleanser or saline solution to wet the sponge and help it come off more easily. Throw the old sponge and cover dressing supplies into the garbage bag. Check the wound for signs of infection, including a bad smell, drainage (bleeding or pus), or new color changes (black, grey, yellow, or white) in the wound. Remove your gloves by grabbing the cuff and turning the glove inside out. Place the gloves in the trash immediately. Wash your hands with soap and water for at least 20 seconds. If soap and water are not available, use hand sanitizer. Dry your hands with a clean towel. Clean your wound Wear gloves, protective clothing, and eye protection. Follow your health care provider's instructions on how to clean your wound. You may be told to: Clean the wound using a saline solution or a wound cleanser and a clean gauze pad. Pat the wound dry with a gauze pad. Do not rub the wound. Throw the gauze pad into the garbage bag. Remove your gloves by grabbing the cuff and turning the glove inside out. Place the gloves in the trash immediately. Wash your hands with soap and water for at least 20 seconds. If soap and  water are not available, use hand sanitizer Dry your hands with a clean towel. Apply the new dressing Wear gloves, protective clothing, and eye protection. If told by your health care provider, apply a skin protectant to any skin that will be exposed to adhesive. Let the skin protectant dry. Cut a piece of new sponge dressing and put it on or in the wound. Using clean scissors, cut a  nickel-sized hole in the new cover dressing. Apply the cover dressing. Attach the suction tube over the hole in the cover dressing. Take off your gloves. Put them in the plastic bag with the old dressing. Tie the bag shut and throw it away. Wash your hands with soap and water for at least 20 seconds. If soap and water are not available, use hand sanitizer. Dry your hands with a clean towel. Turn the pump back on. The sponge dressing should collapse. Do not change the settings on the machine without talking to a health care provider. Replace the container in the pump that collects fluid if it is full. Replace the container per the manufacturer's instructions or at least once a week, even if it is not full. Follow your health care provider'sinstructions on how often you need to change and apply the new NPWT dressing. General tips and recommendations If the alarm sounds: Stay calm and prepare to troubleshoot. Do not turn off the pump or do anything with the dressing. The alarm may go off for many reasons, including: The battery is low. Change the battery or plug the device into electrical power. The dressing has a leak. Find the leak and put tape over the leak. The fluid collection container is full. Change the fluid container. Call your health care provider right away if you cannot fix the problem. Explain to your health care provider what is happening. Follow his or her instructions. General instructions Change the dressing as scheduled, if the dressing loses suction, or the pump is off for 2 hours or more. Do not turn off the pump unless told to do so by your health care provider. Do not turn off the pump for more than 2 hours. If the pump is off or you lose suction to the dressing for more than 2 hours, the dressing will need to be changed. Check the machine frequently to make sure that therapy is on and that all clamps are open. Do not use over-the-counter medicated or antiseptic creams,  sprays, liquids, or dressings unless told by your health care provider. Do not take baths, swim, or use a hot tub until your health care provider approves. You may only be allowed to take sponge baths. Ask your health care provider if you may take showers. If your health care provider says it is okay to shower: Do not take the pump into the shower. Make sure the wound dressing is protected and sealed. The wound dressing must stay dry. Contact a health care provider if: You have new pain. You develop irritation, a rash, or itching around the wound or dressing. You see new color changes (black, grey, yellow, or white) in the wound. The dressing changes are painful or cause bleeding. The pump has been off for more than 2 hours, and you do not know how to change the dressing. The pump alarm goes off, and you do not know what to do. Get help right away if: You have a lot of bleeding. The wound breaks open. You have severe pain. You have signs of infection, such as:  More redness, swelling, or pain. More fluid or blood. Warmth or hardness. Pus or a bad smell. Red streaks leading from the wound. A fever. You see a sudden change in the color or texture of the drainage. You have signs of dehydration, such as: Little or no tears, urine, or sweat. Muscle cramps. Very dry mouth. Headache. Dizziness or confusion. Summary NPWT uses a sponge or dressing placed on or inside the wound. NPWT helps to increase blood flow to the wound and heal it from the inside. Follow your health care provider's instructions on how to clean your wound and how to change the dressing. Contact a health care provider if you have new pain, an irritation, or a rash, or if the alarm goes off and you do not know what to do. Get help right away if you have a lot of bleeding, your wound breaks open, or you have severe pain. Also, get help if you have signs of infection. This information is not intended to replace advice given  to you by your health care provider. Make sure you discuss any questions you have with your health care provider. Document Revised: 03/24/2021 Document Reviewed: 03/24/2021 Elsevier Patient Education  2024 [employer].

## 2024-02-07 ENCOUNTER — Encounter: Payer: Self-pay | Admitting: Surgery

## 2024-02-08 ENCOUNTER — Encounter: Payer: Self-pay | Admitting: Surgery

## 2024-02-08 ENCOUNTER — Ambulatory Visit (INDEPENDENT_AMBULATORY_CARE_PROVIDER_SITE_OTHER): Admitting: Surgery

## 2024-02-08 VITALS — BP 148/76 | HR 59 | Temp 98.2°F | Ht 66.5 in | Wt 175.0 lb

## 2024-02-08 DIAGNOSIS — S31109D Unspecified open wound of abdominal wall, unspecified quadrant without penetration into peritoneal cavity, subsequent encounter: Secondary | ICD-10-CM

## 2024-02-08 DIAGNOSIS — S31109A Unspecified open wound of abdominal wall, unspecified quadrant without penetration into peritoneal cavity, initial encounter: Secondary | ICD-10-CM

## 2024-02-08 NOTE — Patient Instructions (Addendum)
 Change your Aquacel dressing every 2-3 days. When you change this you may shower also. You may need to get in the shower and wash the Aquacel out. Wash as usual, rinse well, pat dry with gauze. Redress the area with strips of Aquacel, filling the cavity with the dressing. Cover with dry gauze and tape in place. May try abdominal binder or elastic bandage as needed for securement.   We will send a [facility] to [facility] for them to help you with this wound and dressings. They will call you to schedule this.  Follow up here on Friday as scheduled.

## 2024-02-08 NOTE — Progress Notes (Signed)
 [facility] POST-OP OFFICE VISIT  02/08/2024  HPI: [name] is a 58 y.o. female had surgery on January 26, 2024, now s/p opening of midline wound exploration and removal of residual permanent suture.  Application of negative pressure wound therapy.  Reapplication 4 days ago on Friday.  Wound measurements at that time were 5 x 3.5 x 2 cm.  She tolerated negative pressure wound therapy quite well.  Here for reapplication.   She reports developing some small pustules under the draping material for the VAC.  She had a VAC disc function this morning, so it was not actively sucking on presentation.  Vital signs: BP (!) 148/76   Pulse (!) 59   Temp 98.2 F (36.8 C)   Ht 5' 6.5" (1.689 m)   Wt 175 lb (79.4 kg)   LMP 07/11/2016 (Exact Date)   SpO2 98%   BMI 27.82 kg/m    Physical Exam: Constitutional: She appears well. Abdomen: No erythema, wound VAC in place. Skin: Surrounding skin is under the VAC drape has some small pustules present, no erythema, no induration.  The abdominal wound subcutaneous tissues are velvety granular tissue, some of the skin on the right side has also been incorporated into the foam, therefore it also has a velvety appearance..  The deepest  tissues of the wound are somewhat shiny still, perhaps somehow coated, or excluded away from the actual sponge.  There is no significant soupy discharge, diminished minimal amount of wound exudate present.  With application of 2% Xylocaine jelly, I utilized a bone curette to perform an excisional debridement of the skin and subcutaneous tissues of this wound all the way down to the abdominal wound fascia.  The wound measures 58 x 29 x 18 mm in depth.  I say it is 99 % granulated.  Assessment/Plan: This is a 58 y.o. female status post laying open of chronic midline abdominal wound/abscess with removal of residual permanent foreign body/suture, application of wound VAC.   Will discontinue wound VAC at this  time and transition to Aquacel Ag.  I believe the frequency of dressing changes may be more tolerable for her and better on her skin than the negative pressure wound therapy draping. 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   Paroxysmal atrial fibrillation with RVR (HCC) 09/29/2023   Open abdominal wall wound 09/29/2023   Hypokalemia 09/29/2023   Near syncope 09/29/2023   Left lower quadrant abdominal pain 06/03/2023   Wound dehiscence, surgical, sequela 05/10/2023   Poison ivy 05/03/2023   Overeating 02/22/2023   Abnormal brain MRI 02/22/2023   Tremor 01/11/2023   Abdominal hernia 09/18/2022   Abdominal distention 01/09/2022   Anemia 07/01/2021   Dysuria 07/01/2021   Dyspareunia, female 06/30/2021   Low back pain 12/09/2020   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   Headache 07/05/2020   Osteomalacia 12/27/2019   History of weight loss 11/28/2019   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   Localized osteoporosis without current pathological fracture 06/26/2019   Hypocalcemia 06/26/2019   Secondary hyperparathyroidism (HCC) 06/26/2019   Multinodular goiter 06/26/2019   Dysphagia 05/05/2019   Osteoporosis 05/03/2019   Memory loss 02/13/2019   Elevated liver function tests 10/13/2017   Epigastric abdominal pain 10/13/2017   History of kidney stones 10/13/2017   Hypertension 10/13/2017   Status post bariatric surgery 09/03/2017   Right ureteral calculus 08/31/2017   Hydronephrosis with urinary obstruction  due to ureteral calculus 08/31/2017   Nephrolithiasis 08/31/2017   Screen for STD (sexually transmitted disease) 07/26/2017   Sleep apnea 10/06/2016   Candidal intertrigo 09/21/2016   H/O gastric bypass 09/21/2016   Hiatal hernia 04/21/2016   Elevated liver enzymes 09/04/2014   Vitamin D deficiency 09/03/2014   Routine general medical examination at a health care facility  01/11/2014   T2DM (type 2 diabetes mellitus) (HCC) 01/11/2014    -Due to change in wound care, she may benefit from wound care consultation, assistance with dressing supplies etc.  She still thinks she cannot do this dressing change at home.  As for now I appreciate Mr. Manus Rudd help with another dressing change this Friday.   Campbell Lerner M.D., FACS 02/08/2024, 9:37 AM

## 2024-02-09 ENCOUNTER — Encounter: Admitting: Physician Assistant

## 2024-02-10 ENCOUNTER — Encounter: Attending: Physician Assistant | Admitting: Physician Assistant

## 2024-02-10 DIAGNOSIS — K743 Primary biliary cirrhosis: Secondary | ICD-10-CM | POA: Diagnosis not present

## 2024-02-10 DIAGNOSIS — Z7901 Long term (current) use of anticoagulants: Secondary | ICD-10-CM | POA: Diagnosis not present

## 2024-02-10 DIAGNOSIS — L98492 Non-pressure chronic ulcer of skin of other sites with fat layer exposed: Secondary | ICD-10-CM | POA: Insufficient documentation

## 2024-02-10 DIAGNOSIS — T8131XA Disruption of external operation (surgical) wound, not elsewhere classified, initial encounter: Secondary | ICD-10-CM | POA: Diagnosis present

## 2024-02-10 DIAGNOSIS — I4891 Unspecified atrial fibrillation: Secondary | ICD-10-CM | POA: Insufficient documentation

## 2024-02-10 DIAGNOSIS — I10 Essential (primary) hypertension: Secondary | ICD-10-CM | POA: Insufficient documentation

## 2024-02-10 DIAGNOSIS — Z9884 Bariatric surgery status: Secondary | ICD-10-CM | POA: Insufficient documentation

## 2024-02-10 DIAGNOSIS — X58XXXA Exposure to other specified factors, initial encounter: Secondary | ICD-10-CM | POA: Insufficient documentation

## 2024-02-10 DIAGNOSIS — I480 Paroxysmal atrial fibrillation: Secondary | ICD-10-CM | POA: Diagnosis not present

## 2024-02-11 ENCOUNTER — Ambulatory Visit: Admitting: Physician Assistant

## 2024-02-11 ENCOUNTER — Encounter: Payer: Self-pay | Admitting: Physician Assistant

## 2024-02-11 VITALS — BP 152/87 | HR 60 | Temp 98.3°F | Ht 66.5 in | Wt 176.4 lb

## 2024-02-11 DIAGNOSIS — S31109D Unspecified open wound of abdominal wall, unspecified quadrant without penetration into peritoneal cavity, subsequent encounter: Secondary | ICD-10-CM | POA: Diagnosis not present

## 2024-02-11 DIAGNOSIS — S31109A Unspecified open wound of abdominal wall, unspecified quadrant without penetration into peritoneal cavity, initial encounter: Secondary | ICD-10-CM

## 2024-02-11 DIAGNOSIS — Z09 Encounter for follow-up examination after completed treatment for conditions other than malignant neoplasm: Secondary | ICD-10-CM | POA: Diagnosis not present

## 2024-02-11 NOTE — Patient Instructions (Signed)

## 2024-02-11 NOTE — Progress Notes (Signed)
 [facility] POST-OP OFFICE VISIT  02/11/2024  HPI: [name] is a 58 y.o. female 16 days s/p abdominal wound exploration, removal of the suture foreign body, excisional debridement of skin and subcutaneous tissues, placement of negative pressure wound therapy dressing with Dr Claudine Mouton   Since the last visit, wound vac has been discontinued secondary to maceration and changes to the surrounding skin. She has been seen and established with wound care center. Visit from 04/03 reviewed. Now utilizing hydrofera blue packing every other day. Also applying topical steroid cream to surrounding skin with significant improvement.   This morning, she reports she is doing well. Does feel some surrounding burning pains and more fullness in her abdomen, which she suspects is secondary to inflammation. She denied any fevers. She reports her surrounding skin is markedly improved.   Vital signs: BP (!) 152/87   Pulse 60   Temp 98.3 F (36.8 C) (Oral)   Ht 5' 6.5" (1.689 m)   Wt 176 lb 6.4 oz (80 kg)   LMP 07/11/2016 (Exact Date)   SpO2 97%   BMI 28.05 kg/m    Physical Exam: Constitutional: Well appearing female, NAD Abdomen: Soft, she is slight tender to the surrounding abdomen worse on the left, non-distended, no rebound/guarding Skin: 5 x 3 x 1 cm to epigastrium, superior to umbilicus, wound bed appears healthy, granulating. No erythema or drainage   Assessment/Plan: This is a 58 y.o. female 16 days s/p abdominal wound exploration, removal of the suture foreign body, excisional debridement of skin and subcutaneous tissues, placement of negative pressure wound therapy dressing with Dr Claudine Mouton    - Greatly appreciate wound care evaluation and recommendations; continue hydrofera blue dressing as prescribed. She seems to understand home changes.   - I do think her discomfort and pain is expected given degree of resection and size of her wound. I did offer analgesics but  she wishes to wait  - Okay to continue topical steroid cream as needed to surrounding skin   - She can follow up with Dr Claudine Mouton next week on Tuesday; After this, suspect we may be able to follow bi-weekly as she is established with wound care  - Follow up with wound care as scheduled   -- Lynden Oxford, PA-C [facility] 02/11/2024, 10:40 AM M-F: 7am - 4pm

## 2024-02-15 ENCOUNTER — Encounter: Payer: Self-pay | Admitting: Surgery

## 2024-02-15 ENCOUNTER — Ambulatory Visit (INDEPENDENT_AMBULATORY_CARE_PROVIDER_SITE_OTHER): Admitting: Surgery

## 2024-02-15 ENCOUNTER — Encounter: Payer: Self-pay | Admitting: Family

## 2024-02-15 VITALS — BP 174/92 | HR 66 | Temp 98.2°F | Ht 66.5 in | Wt 170.8 lb

## 2024-02-15 DIAGNOSIS — S31109D Unspecified open wound of abdominal wall, unspecified quadrant without penetration into peritoneal cavity, subsequent encounter: Secondary | ICD-10-CM

## 2024-02-15 DIAGNOSIS — S31109A Unspecified open wound of abdominal wall, unspecified quadrant without penetration into peritoneal cavity, initial encounter: Secondary | ICD-10-CM

## 2024-02-15 DIAGNOSIS — R899 Unspecified abnormal finding in specimens from other organs, systems and tissues: Secondary | ICD-10-CM

## 2024-02-15 NOTE — Patient Instructions (Signed)

## 2024-02-15 NOTE — Progress Notes (Signed)
 [facility] POST-OP OFFICE VISIT  02/15/2024  HPI: [name] is a 58 y.o. female had surgery on January 26, 2024, now s/p opening of midline wound exploration and removal of residual permanent suture.  Changed to utilizing hydrofera blue packing every other day.   Seen at wound care and then will continue with follow-up there.  She seems much more comfortable with the idea of changing her dressing at home now.   Vital signs: BP (!) 174/92   Pulse 66   Temp 98.2 F (36.8 C) (Oral)   Ht 5' 6.5" (1.689 m)   Wt 170 lb 12.8 oz (77.5 kg)   LMP 07/11/2016 (Exact Date)   SpO2 97%   BMI 27.15 kg/m    Physical Exam: Constitutional: She appears well. Abdomen: No erythema, surrounding skin appears much happier.  The wound is moist without remarkable exudates.  100% granulated, and significantly smaller in size.   Assessment/Plan: This is a 58 y.o. female status post laying open of chronic midline abdominal wound/abscess with removal of residual permanent foreign body/suture, application of wound VAC.    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   Paroxysmal atrial fibrillation with RVR (HCC) 09/29/2023   Open abdominal wall wound 09/29/2023   Hypokalemia 09/29/2023   Near syncope 09/29/2023   Left lower quadrant abdominal pain 06/03/2023   Wound dehiscence, surgical, sequela 05/10/2023   Poison ivy 05/03/2023   Overeating 02/22/2023   Abnormal brain MRI 02/22/2023   Tremor 01/11/2023   Abdominal hernia 09/18/2022   Abdominal distention 01/09/2022   Anemia 07/01/2021   Dysuria 07/01/2021   Dyspareunia, female 06/30/2021   Low back pain 12/09/2020   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   Headache 07/05/2020   Osteomalacia 12/27/2019   History of weight loss 11/28/2019   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   Localized osteoporosis without current  pathological fracture 06/26/2019   Hypocalcemia 06/26/2019   Secondary hyperparathyroidism (HCC) 06/26/2019   Multinodular goiter 06/26/2019   Dysphagia 05/05/2019   Osteoporosis 05/03/2019   Memory loss 02/13/2019   Elevated liver function tests 10/13/2017   Epigastric abdominal pain 10/13/2017   History of kidney stones 10/13/2017   Hypertension 10/13/2017   Status post bariatric surgery 09/03/2017   Right ureteral calculus 08/31/2017   Hydronephrosis with urinary obstruction due to ureteral calculus 08/31/2017   Nephrolithiasis 08/31/2017   Screen for STD (sexually transmitted disease) 07/26/2017   Sleep apnea 10/06/2016   Candidal intertrigo 09/21/2016   H/O gastric bypass 09/21/2016   Hiatal hernia 04/21/2016   Elevated liver enzymes 09/04/2014   Vitamin D deficiency 09/03/2014   Routine general medical examination at a health care facility 01/11/2014   T2DM (type 2 diabetes mellitus) (HCC) 01/11/2014    -Appreciate the benefit from wound care consultation/ and follow  up, assistance with dressing supplies etc.    - Will continue Hydrofera Blue dressing changes as instructed.  As she will be seeing wound care on a weekly basis, we can diminish our follow-up to no more than monthly, if that is even helpful in light of the redundancy.  I would like to see her wound through to completion.   Campbell Lerner M.D., FACS 02/15/2024, 1:18 PM

## 2024-02-17 ENCOUNTER — Encounter: Admitting: Physician Assistant

## 2024-02-17 DIAGNOSIS — T8131XA Disruption of external operation (surgical) wound, not elsewhere classified, initial encounter: Secondary | ICD-10-CM | POA: Diagnosis not present

## 2024-02-23 LAB — CBC WITH DIFFERENTIAL/PLATELET
Basophils Absolute: 0 10*3/uL (ref 0.0–0.2)
Basos: 0 %
EOS (ABSOLUTE): 0.2 10*3/uL (ref 0.0–0.4)
Eos: 3 %
Hematocrit: 35.8 % (ref 34.0–46.6)
Hemoglobin: 11 g/dL — ABNORMAL LOW (ref 11.1–15.9)
Immature Grans (Abs): 0 10*3/uL (ref 0.0–0.1)
Immature Granulocytes: 0 %
Lymphocytes Absolute: 2.3 10*3/uL (ref 0.7–3.1)
Lymphs: 36 %
MCH: 27.2 pg (ref 26.6–33.0)
MCHC: 30.7 g/dL — ABNORMAL LOW (ref 31.5–35.7)
MCV: 89 fL (ref 79–97)
Monocytes Absolute: 0.6 10*3/uL (ref 0.1–0.9)
Monocytes: 10 %
Neutrophils Absolute: 3.3 10*3/uL (ref 1.4–7.0)
Neutrophils: 51 %
Platelets: 271 10*3/uL (ref 150–450)
RBC: 4.04 x10E6/uL (ref 3.77–5.28)
RDW: 15.2 % (ref 11.7–15.4)
WBC: 6.5 10*3/uL (ref 3.4–10.8)

## 2024-02-23 LAB — COMPREHENSIVE METABOLIC PANEL WITH GFR
ALT: 20 IU/L (ref 0–32)
AST: 25 IU/L (ref 0–40)
Albumin: 4 g/dL (ref 3.8–4.9)
Alkaline Phosphatase: 220 IU/L — ABNORMAL HIGH (ref 44–121)
BUN/Creatinine Ratio: 26 — ABNORMAL HIGH (ref 9–23)
BUN: 21 mg/dL (ref 6–24)
Bilirubin Total: <0.2 mg/dL (ref 0.0–1.2)
CO2: 20 mmol/L (ref 20–29)
Calcium: 9.3 mg/dL (ref 8.7–10.2)
Chloride: 108 mmol/L — ABNORMAL HIGH (ref 96–106)
Creatinine, Ser: 0.81 mg/dL (ref 0.57–1.00)
Globulin, Total: 2.9 g/dL (ref 1.5–4.5)
Glucose: 73 mg/dL (ref 70–99)
Potassium: 4.4 mmol/L (ref 3.5–5.2)
Sodium: 143 mmol/L (ref 134–144)
Total Protein: 6.9 g/dL (ref 6.0–8.5)
eGFR: 85 mL/min/{1.73_m2} (ref >59)

## 2024-02-24 ENCOUNTER — Encounter: Admitting: Physician Assistant

## 2024-02-24 DIAGNOSIS — T8131XA Disruption of external operation (surgical) wound, not elsewhere classified, initial encounter: Secondary | ICD-10-CM | POA: Diagnosis not present

## 2024-02-25 ENCOUNTER — Ambulatory Visit: Admitting: Physician Assistant

## 2024-03-01 ENCOUNTER — Encounter: Payer: Self-pay | Admitting: Family

## 2024-03-01 NOTE — Addendum Note (Signed)
 Addended by: Treacy Holcomb on: 03/01/2024 04:32 PM   Modules accepted: Orders

## 2024-03-01 NOTE — Addendum Note (Signed)
 Addended by: Iesha Summerhill on: 03/01/2024 04:33 PM   Modules accepted: Orders

## 2024-03-02 ENCOUNTER — Encounter: Admitting: Physician Assistant

## 2024-03-02 DIAGNOSIS — T8131XA Disruption of external operation (surgical) wound, not elsewhere classified, initial encounter: Secondary | ICD-10-CM | POA: Diagnosis not present

## 2024-03-09 ENCOUNTER — Encounter: Attending: Physician Assistant | Admitting: Physician Assistant

## 2024-03-09 DIAGNOSIS — I480 Paroxysmal atrial fibrillation: Secondary | ICD-10-CM | POA: Diagnosis not present

## 2024-03-09 DIAGNOSIS — L98492 Non-pressure chronic ulcer of skin of other sites with fat layer exposed: Secondary | ICD-10-CM | POA: Insufficient documentation

## 2024-03-09 DIAGNOSIS — T8131XA Disruption of external operation (surgical) wound, not elsewhere classified, initial encounter: Secondary | ICD-10-CM | POA: Insufficient documentation

## 2024-03-09 DIAGNOSIS — Z9884 Bariatric surgery status: Secondary | ICD-10-CM | POA: Insufficient documentation

## 2024-03-09 DIAGNOSIS — Y834 Other reconstructive surgery as the cause of abnormal reaction of the patient, or of later complication, without mention of misadventure at the time of the procedure: Secondary | ICD-10-CM | POA: Diagnosis not present

## 2024-03-09 DIAGNOSIS — K743 Primary biliary cirrhosis: Secondary | ICD-10-CM | POA: Diagnosis not present

## 2024-03-16 ENCOUNTER — Encounter: Payer: Self-pay | Admitting: Surgery

## 2024-03-16 ENCOUNTER — Ambulatory Visit (INDEPENDENT_AMBULATORY_CARE_PROVIDER_SITE_OTHER): Admitting: Surgery

## 2024-03-16 ENCOUNTER — Encounter: Payer: Self-pay | Admitting: Family

## 2024-03-16 VITALS — BP 123/78 | HR 65 | Temp 98.0°F | Ht 66.5 in

## 2024-03-16 DIAGNOSIS — S301XXD Contusion of abdominal wall, subsequent encounter: Secondary | ICD-10-CM

## 2024-03-16 DIAGNOSIS — S31109D Unspecified open wound of abdominal wall, unspecified quadrant without penetration into peritoneal cavity, subsequent encounter: Secondary | ICD-10-CM | POA: Diagnosis not present

## 2024-03-16 DIAGNOSIS — S301XXA Contusion of abdominal wall, initial encounter: Secondary | ICD-10-CM | POA: Insufficient documentation

## 2024-03-16 DIAGNOSIS — L7634 Postprocedural seroma of skin and subcutaneous tissue following other procedure: Secondary | ICD-10-CM

## 2024-03-16 LAB — CBC WITH DIFFERENTIAL/PLATELET
Basophils Absolute: 0 10*3/uL (ref 0.0–0.2)
Basos: 1 %
EOS (ABSOLUTE): 0.1 10*3/uL (ref 0.0–0.4)
Eos: 2 %
Hematocrit: 33.4 % — ABNORMAL LOW (ref 34.0–46.6)
Hemoglobin: 10.3 g/dL — ABNORMAL LOW (ref 11.1–15.9)
Immature Grans (Abs): 0 10*3/uL (ref 0.0–0.1)
Immature Granulocytes: 0 %
Lymphocytes Absolute: 2.1 10*3/uL (ref 0.7–3.1)
Lymphs: 36 %
MCH: 26.3 pg — ABNORMAL LOW (ref 26.6–33.0)
MCHC: 30.8 g/dL — ABNORMAL LOW (ref 31.5–35.7)
MCV: 85 fL (ref 79–97)
Monocytes Absolute: 0.5 10*3/uL (ref 0.1–0.9)
Monocytes: 8 %
Neutrophils Absolute: 3.1 10*3/uL (ref 1.4–7.0)
Neutrophils: 53 %
Platelets: 256 10*3/uL (ref 150–450)
RBC: 3.91 x10E6/uL (ref 3.77–5.28)
RDW: 14.5 % (ref 11.7–15.4)
WBC: 5.9 10*3/uL (ref 3.4–10.8)

## 2024-03-16 NOTE — Patient Instructions (Signed)
 You have requested to have exploratory abdominal surgery.  This will be done at [facility] with Dr. Ofilia Benton.  If you are on any injectable weight loss medication, you will need to stop taking your GLP-1 injectable (weight loss) medications 8 days before your surgery to avoid any complications with anesthesia.   You will most likely be out of work 1-2 weeks for this surgery.  If you have FMLA or disability paperwork that needs filled out you may drop this off at our office or this can be faxed to [phone].  You will return after your post-op appointment with a lifting restriction for approximately 4 more weeks. Please see the (blue)pre-care form that you have been given today. Our surgery scheduler will call you to verify surgery date and to go over information.   If you have any questions, please call our office.    Exploratory Laparotomy, Adult Exploratory laparotomy is surgery to check for problems in the organs inside the body by making an incision in the belly, or abdomen. You may have this surgery if you have a problem in the belly, such as pain, serious injury, bleeding, infection, or blockage. Exploratory laparotomy may be a planned surgery or an emergency one. If a problem is found during this surgery, you may have another surgery to treat the problem. You may also have other treatments after your laparotomy. Tell a health care provider about: Any allergies you have. All medicines you are taking. These include vitamins, herbs, eye drops, creams, and over-the-counter medicines. Any problems you or family members have had with anesthesia. Any surgeries you have had. Any medical conditions or bleeding problems you have. Any use of alcohol, tobacco, marijuana, or drugs. Whether you are pregnant or may be pregnant. What are the risks? Your health care provider will talk with you about risks. These may include: Bleeding. Infection. A blood clot that forms in your leg and  travels to your lungs. Damage to organs inside your belly. Scar tissue that blocks your digestive tract. What happens before the procedure? When to stop eating and drinking Follow instructions from your provider about what you may eat and drink. These may include: 8 hours before your procedure Stop eating most foods. Do not eat meat, fried foods, or fatty foods. Eat only light foods, such as toast or crackers. All liquids are okay except energy drinks and alcohol. 6 hours before your procedure Stop eating. Drink only clear liquids, such as water, clear fruit juice, black coffee, plain tea, and sports drinks. Do not drink energy drinks or alcohol. 2 hours before your procedure Stop drinking all liquids. You may be allowed to take medicines with small sips of water. If you do not follow your provider's instructions, your procedure may be delayed or canceled. Medicines Ask your provider about: Changing or stopping your regular medicines. These include any diabetes medicines or blood thinners you take. Taking medicines such as aspirin and ibuprofen. These medicines can thin your blood. Do not take them unless your provider tells you to. Taking over-the-counter medicines, vitamins, herbs, and supplements. Surgery safety Ask your provider: How your surgery site will be marked. What steps will be taken to help prevent infection. These steps may include: Removing hair at the surgery site. Washing skin with a soap that kills germs. Taking antibiotics. General instructions Do not use any products that contain nicotine or tobacco for at least 4 weeks before the procedure. These products include cigarettes, chewing tobacco, and vaping devices, such as e-cigarettes.  If you need help quitting, ask your provider. You may have imaging tests, such as ultrasound, CT scan, or MRI. You may also have blood or pee (urine) tests. You may need to clear your bowels before surgery. This is called bowel prep.  You will be told how to do this. If you are in the hospital, the bowel prep may be done there. What happens during the procedure? You will be given: A sedative. This helps you relax. Anesthesia. This keeps you from feeling pain. It will make you fall asleep for surgery. A cut, or incision, will be made in your belly. All organs in the belly will be checked. The rest of the surgery will depend on what your provider finds. If any organ is damaged or blocked, it will be repaired. If there's bleeding in the belly, the source will be found and repaired. If there's pus or stomach fluids in your belly, the provider will check for infection or a hole in your digestive tract. If there's an infection, a drain may be placed to empty fluid that can build up in your belly after surgery. If there's a growth or tumor, a piece of it may be removed for a biopsy. The cut in your belly will be closed with stitches, skin glue, or tape strips. A bandage may be placed over the cut in the belly. The surgery may vary among providers and hospitals. What happens after the procedure?     Your blood pressure, heart rate, breathing rate, and blood oxygen level will be monitored until you leave the hospital. You will continue to get fluids, antibiotics, or pain medicines through the IV. You will be monitored until you are able to: Eat and drink well. Make enough pee. Pass gas. Wear compression stockings as told by your provider. These stockings help to prevent blood clots and reduce swelling in your legs. You will be helped to walk as soon as possible. This will improve breathing and blood flow. You will do breathing exercises to keep your lungs clear. This information is not intended to replace advice given to you by your health care provider. Make sure you discuss any questions you have with your health care provider. Document Revised: 01/13/2023 Document Reviewed: 01/13/2023 Elsevier Patient Education  2024  [employer].

## 2024-03-16 NOTE — H&P (View-Only) (Signed)
 Patient ID: [name], female   DOB: 08-11-1966, 58 y.o.   MRN: [id number]  Chief Complaint: Known seroma along abdominoplasty incision and pain associated with pressure to the area.  History of Present Illness [name] is a 58 y.o. female with recent healing from a persistent epigastric sinus tract from an infected suture.  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  This was noted on her prior CT scan in March, and this has been present proximately 3 years.  We attempted aspiration under ultrasound guidance without any draw of fluid.  There may be a gelatinous component that may have prevented any aspiration there.  She reports it has been waxing and waning and has persisted.  It is never drained.  It does not seem to be associated with inflammatory changes, erythema or fever or chills.  She would like to address this.  Past Medical History Past Medical History:  Diagnosis Date   Asthma due to seasonal allergies    Bradycardia    Diastolic dysfunction    Dysplasia of cervix, low grade (CIN 1)    Hiatal hernia    a.) s/p repair concurrently with biliopancreatic diversion with duodenal switch procedure in 2017   History of diabetes mellitus, type II    a.) resolved after bariatric surgery done in 2017   History of hiatal hernia    History of kidney stones    History of obstructive sleep apnea    a.) resolved after bariatric surgery done in 2017; has had repeat PSG testing that was negative since surgery   HTN (hypertension)    Hydronephrosis, right    Hyperlipidemia    Iron deficiency anemia    MRSA carrier    Multinodular goiter    NAFL (nonalcoholic fatty liver)    Nephrolithiasis    New onset atrial fibrillation (HCC) 09/29/2023   a.) Dx'd 09/2023; b.) CHA2DS2VASc = 3 (sex, HTN,  T2DM) as of  01/24/2024; b.) rate/rhythm maintained intrinsically without need for pharmacological intervention; chronically anticoagulated with apixaban    On apixaban  therapy    Osteoporosis    Primary biliary cirrhosis (HCC)    Skin cancer of nose    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 Therese Flash @[facility] , [place]   BRACHIOPLASTY Bilateral    BREAST BIOPSY Right 2017   FIBROCYSTIC CHANGES WITH CALCIFICATIONS   CESAREAN SECTION     x 2   COLONOSCOPY WITH PROPOFOL  N/A 09/11/2019   Procedure: COLONOSCOPY WITH PROPOFOL ;  Surgeon: Luke Salaam, MD;  Location: [facility];  Service: Gastroenterology;  Laterality: N/A;   CYSTOSCOPY WITH RETROGRADE PYELOGRAM, URETEROSCOPY AND STENT PLACEMENT Right 02/06/2022   CYSTOSCOPY WITH STENT PLACEMENT Right 09/10/2017   Procedure: CYSTOSCOPY WITH STENT PLACEMENT;  Surgeon: Geraline Knapp, MD;  Location: [facility];  Service: Urology;  Laterality: Right;   CYSTOSCOPY/URETEROSCOPY/HOLMIUM LASER/STENT PLACEMENT Right 04/24/2019   Procedure: CYSTOSCOPY/URETEROSCOPY/STENT PLACEMENT/  RIGHT RETROGRADE;  Surgeon: Christina Coyer, MD;  Location: [facility];  Service: Urology;  Laterality: Right;   DX LAPAROSCOPY/  EGD/  HIATAL HERNIA REPAIR CLOSURE OF INTERNAL MENSENTRY DEFECT  10/28/2017   @[facility] , [place]   EXTRACORPOREAL SHOCK WAVE LITHOTRIPSY N/A 01/30/2019   Procedure: EXTRACORPOREAL SHOCK WAVE LITHOTRIPSY (ESWL) LEFT/ POSSIBLE RIGHT;  Surgeon: Christina Coyer, MD;  Location: [facility];  Service: Urology;  Laterality: N/A;   EXTRACORPOREAL SHOCK WAVE LITHOTRIPSY Right 03/20/2019   Procedure: EXTRACORPOREAL SHOCK WAVE LITHOTRIPSY (ESWL);  Surgeon: Trent Frizzle, MD;  Location: [facility];  Service: Urology;  Laterality: Right;   EXTRACORPOREAL SHOCK WAVE LITHOTRIPSY Left 10/30/2021   Procedure: EXTRACORPOREAL SHOCK WAVE LITHOTRIPSY (ESWL);  Surgeon: Andrez Banker, MD;  Location: [facility];  Service: Urology;  Laterality: Left;   LAPAROSCOPIC CHOLECYSTECTOMY  10/15/2016   @[facility]  , [place]   LAPAROTOMY N/A 01/26/2024   Procedure: LAPAROTOMY, EXPLORATORY of abdominal wall wounds, for removal of foreign body; ultrasound-guided aspiratio of seroma;  Surgeon: Flynn Hylan, MD;  Location: [facility];  Service: General;  Laterality: N/A;   LIPOSUCTION     PANNICULECTOMY     2022   TONSILLECTOMY AND ADENOIDECTOMY  child   URETEROSCOPY WITH HOLMIUM LASER LITHOTRIPSY Right 09/10/2017   Procedure: URETEROSCOPY WITH HOLMIUM LASER LITHOTRIPSY;  Surgeon: Geraline Knapp, MD;  Location: [facility];  Service: Urology;  Laterality: Right;    Allergies  Allergen Reactions   Ivp Dye [Iodinated Contrast Media]     Patient 'codes' with IV contrast   Latex Hives, Shortness Of Breath and Cough   Tolmetin Hives   Gallium Nitrate Hives    AND Gadolinium Derivatives    Insulin  Detemir Hives and Swelling    Lump at injection site   Nsaids Hives    Per pt stated has to take ibuprofen with a antihistimine   Gadolinium Derivatives Hives    Pt c/o itching after contrast injection 03/15/19 @ 8:45am. [facility] Per Dr. Nereida Banning document this as allergic reaction.     Current Outpatient Medications  Medication Sig Dispense Refill   amLODipine  (NORVASC ) 5 MG tablet Take 1 tablet (5 mg total) by mouth in the morning and at bedtime. 60 tablet 11   apixaban  (ELIQUIS ) 5 MG TABS tablet Take 1 tablet (5 mg total) by mouth 2 (two) times daily. (Patient taking  differently: Take 5 mg by mouth daily.) 60 tablet 11   Multiple Vitamins-Minerals (BARIATRIC MULTIVITAMINS/IRON PO) Take 1 tablet by mouth daily.     ursodiol  (ACTIGALL ) 250 MG tablet Take 500 mg by mouth 2 (two) times daily.     No current facility-administered medications for this visit.    Family History Family History  Problem Relation Age of Onset   Seizures Mother    Alcohol abuse Mother    Hyperlipidemia Mother    Hypertension Mother    Lymphoma Mother        primary site : brain   Leukemia Mother    Parkinsonism Father    Hyperlipidemia Father    Hypertension Father    Parkinson's disease Father    Drug abuse Sister    Cancer Maternal Grandmother        breast cancer   Breast cancer Maternal Grandmother 67   Liver cancer Paternal Grandfather    Kidney cancer Neg Hx    Prostate cancer Neg Hx       Social History Social History   Tobacco Use   Smoking status: Never  Passive exposure: Never   Smokeless tobacco: Never  Vaping Use   Vaping status: Never Used  Substance Use Topics   Alcohol use: No    Alcohol/week: 0.0 standard drinks of alcohol   Drug use: Never      Physical Exam Blood pressure 123/78, pulse 65, temperature 98 F (36.7 C), height 5' 6.5" (1.689 m), last menstrual period 07/11/2016, SpO2 99%.   CONSTITUTIONAL: Well developed, and nourished, appropriately responsive and aware without distress.   EYES: Sclera non-icteric.   EARS, NOSE, MOUTH AND THROAT: The oropharynx is clear. Oral mucosa is pink and moist.    Hearing is intact to voice.  NECK: Trachea is midline, and there is no jugular venous distension.  LYMPH NODES:  Lymph nodes in the neck are not appreciated. RESPIRATORY:  Lungs are clear, and breath sounds are equal bilaterally.  Normal respiratory effort without pathologic use of accessory muscles. CARDIOVASCULAR: Heart is regular in rate and rhythm.   Well perfused.  GI: The abdomen is healed and the midline scar from her sinus  tract.  Right lower quadrant scarring from abdominal plasty has some areas of tenderness closer to the midline consistent with the previously noted seroma on CT scan exam.  Otherwise soft, nontender, and nondistended.  MUSCULOSKELETAL:  Symmetrical muscle tone appreciated in all four extremities.    SKIN: Skin turgor is normal. No pathologic skin lesions appreciated.  NEUROLOGIC:  Motor and sensation appear grossly normal.  Cranial nerves are grossly without defect. PSYCH:  Alert and oriented to person, place and time. Affect is appropriate for situation.  Data Reviewed I have personally reviewed what is currently available of the patient's imaging, recent labs and medical records.   Labs:     Latest Ref Rng & Units 03/15/2024    9:37 AM 02/22/2024    7:18 AM 09/30/2023    4:36 AM  CBC  WBC 3.4 - 10.8 x10E3/uL 5.9  6.5  5.1   Hemoglobin 11.1 - 15.9 g/dL 16.1  09.6  04.5   Hematocrit 34.0 - 46.6 % 33.4  35.8  33.9   Platelets 150 - 450 x10E3/uL 256  271  252       Latest Ref Rng & Units 02/22/2024    7:18 AM 12/15/2023    2:23 PM 10/12/2023    9:40 AM  CMP  Glucose 70 - 99 mg/dL 73  85  409   BUN 6 - 24 mg/dL 21  13  16    Creatinine 0.57 - 1.00 mg/dL 8.11  9.14  7.82   Sodium 134 - 144 mmol/L 143  145  146   Potassium 3.5 - 5.2 mmol/L 4.4  4.0  4.2   Chloride 96 - 106 mmol/L 108  110  110   CO2 20 - 29 mmol/L 20  23  23    Calcium  8.7 - 10.2 mg/dL 9.3  8.7  8.8   Total Protein 6.0 - 8.5 g/dL 6.9     Total Bilirubin 0.0 - 1.2 mg/dL <9.5     Alkaline Phos 44 - 121 IU/L 220     AST 0 - 40 IU/L 25     ALT 0 - 32 IU/L 20       Imaging: Radiological images reviewed:  CLINICAL DATA:  Right lower quadrant and anterior abdominal wall swelling. History of panniculectomy.   EXAM: CT ABDOMEN AND PELVIS WITHOUT CONTRAST   TECHNIQUE: Multidetector CT imaging of the abdomen and pelvis was performed following the standard protocol without IV  contrast.   RADIATION DOSE REDUCTION: This exam  was performed according to the departmental dose-optimization program which includes automated exposure control, adjustment of the mA and/or kV according to patient size and/or use of iterative reconstruction technique.   COMPARISON:  06/03/2023   FINDINGS: Lower chest: Unremarkable.   Hepatobiliary: No suspicious focal abnormality in the liver on this study without intravenous contrast. Gallbladder is surgically absent. No intrahepatic or extrahepatic biliary dilation.   Pancreas: No focal mass lesion. No dilatation of the main duct. No intraparenchymal cyst. No peripancreatic edema.   Spleen: No splenomegaly. No suspicious focal mass lesion.   Adrenals/Urinary Tract: No adrenal nodule or mass. 2 mm nonobstructing stone identified lower pole right kidney. Right ureter unremarkable. 2 mm nonobstructing stone identified interpolar left kidney with a 1 mm interpolar nonobstructing left renal stone evident. 8 x 7 mm nonobstructing stone identified lower pole left kidney. Left ureter unremarkable. The urinary bladder appears normal for the degree of distention.   Stomach/Bowel: Surgical changes are noted in the stomach. No small bowel wall thickening. No small bowel dilatation. Patulous small bowel anastomosis noted right abdomen. Fluid-filled mildly distended small bowel is seen in the pelvis measuring up 2.9 cm diameter. Assessment for wall thickening is not possible due to lack of oral and intravenous contrast material. No substantial perienteric edema. Terminal ileum unremarkable. The appendix is not well visualized, but there is no edema or inflammation in the region of the cecal tip to suggest appendicitis. No gross colonic mass. No colonic wall thickening.   Vascular/Lymphatic: No abdominal aortic aneurysm. There is no gastrohepatic or hepatoduodenal ligament lymphadenopathy. No retroperitoneal or mesenteric lymphadenopathy. No pelvic sidewall lymphadenopathy.    Reproductive: Uterus not well seen given lack of contrast material. There is no adnexal mass.   Other: No intraperitoneal free fluid.   Musculoskeletal: 11.1 x 1.5 x 4.4 cm ill-defined fluid collection is identified in the deep subcutaneous fat of the lower right anterior abdominal wall, just superficial to the rectus fascia. There is an associated midline component in the suprapubic region that measures on the order of 1.7 x 3.0 x 2.9 cm. These collections likely communicate with each other. Findings could reflect a postoperative seroma. Abscess would also be a consideration although assessment is limited by lack of intravenous contrast material. There is some stranding in the surrounding subcutaneous fat.   No worrisome lytic or sclerotic osseous abnormality.   IMPRESSION: 1. 11.1 x 1.5 x 4.4 cm ill-defined fluid collection in the deep subcutaneous fat of the lower right anterior abdominal wall, just superficial to the rectus fascia. There is an associated midline component in the suprapubic region that measures on the order of 1.7 x 3.0 x 2.9 cm. These collections likely communicate with each other. Findings could reflect a postoperative seroma. Abscess would also be a consideration although assessment is limited by lack of intravenous contrast material. Stranding in the surrounding subcutaneous fat raises the question of cellulitis. 2. Fluid-filled mildly distended small bowel in the pelvis measuring up to 2.9 cm diameter. Assessment for wall thickening is not possible due to lack of oral and intravenous contrast material. No substantial perienteric edema. Imaging features are nonspecific and may be related to ileus. 3. Bilateral nonobstructing renal stones.   Electronically Signed: By: Donnal Fusi M.D. On: 01/10/2024 10:01 Within last 24 hrs: No results found.  Assessment    Symptomatic/chronic seroma, unlikely subclinical abscess of the previous abdominoplasty  site. 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   Paroxysmal atrial fibrillation with RVR (HCC) 09/29/2023   Open abdominal wall wound 09/29/2023   Hypokalemia 09/29/2023   Near syncope 09/29/2023   Left lower quadrant abdominal pain 06/03/2023   Wound dehiscence, surgical, sequela 05/10/2023   Poison ivy 05/03/2023   Overeating 02/22/2023   Abnormal brain MRI 02/22/2023   Tremor 01/11/2023   Abdominal hernia 09/18/2022   Abdominal distention 01/09/2022   Anemia 07/01/2021   Dysuria 07/01/2021   Dyspareunia, female 06/30/2021   Low back pain 12/09/2020   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   Headache 07/05/2020   Osteomalacia 12/27/2019   History of weight loss 11/28/2019   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   Localized osteoporosis without current pathological fracture 06/26/2019   Hypocalcemia 06/26/2019   Secondary hyperparathyroidism (HCC) 06/26/2019   Multinodular goiter 06/26/2019   Dysphagia 05/05/2019   Osteoporosis 05/03/2019   Memory loss 02/13/2019   Elevated liver function tests 10/13/2017   Epigastric abdominal pain 10/13/2017   History of kidney stones 10/13/2017   Hypertension 10/13/2017   Status post bariatric surgery 09/03/2017   Right ureteral calculus 08/31/2017   Hydronephrosis with urinary obstruction due to ureteral calculus 08/31/2017   Nephrolithiasis 08/31/2017   Screen for STD (sexually transmitted disease) 07/26/2017   Sleep apnea 10/06/2016   Candidal intertrigo 09/21/2016   H/O gastric bypass 09/21/2016   Hiatal hernia 04/21/2016   Elevated liver enzymes 09/04/2014   Vitamin D  deficiency 09/03/2014   Routine general medical examination at a health care facility 01/11/2014   T2DM (type 2 diabetes mellitus) (HCC) 01/11/2014    Plan    Under anesthesia we could open and explore the wound looking  for the source of the persistent pain and seroma.  It is possible the wound may be left open for best healing, or resume the close drain for an interval.  She would like to pursue this.  Despite the fact that I could not guarantee her any certain resolution of her pain syndrome, nor guarantee prevention of the recurrent seroma or persistent issue.  She accepts the risks of anesthesia, bleeding, infection and persistence of current problems.  With her questions answered she desires to proceed.  This will be an exploration of the abdominal wall, with likely drainage of chronic/longstanding symptomatic seroma.   Face-to-face time spent with the patient and accompanying care providers(if present) was 40 minutes, spent counseling, educating, and coordinating care of the patient.    These notes generated with voice recognition software. I apologize for typographical errors.  Flynn Hylan M.D., FACS 03/16/2024, 2:11 PM

## 2024-03-16 NOTE — Progress Notes (Signed)
 Patient ID: [name], female   DOB: 08-11-1966, 58 y.o.   MRN: [id number]  Chief Complaint: Known seroma along abdominoplasty incision and pain associated with pressure to the area.  History of Present Illness [name] is a 58 y.o. female with recent healing from a persistent epigastric sinus tract from an infected suture.  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  This was noted on her prior CT scan in March, and this has been present proximately 3 years.  We attempted aspiration under ultrasound guidance without any draw of fluid.  There may be a gelatinous component that may have prevented any aspiration there.  She reports it has been waxing and waning and has persisted.  It is never drained.  It does not seem to be associated with inflammatory changes, erythema or fever or chills.  She would like to address this.  Past Medical History Past Medical History:  Diagnosis Date   Asthma due to seasonal allergies    Bradycardia    Diastolic dysfunction    Dysplasia of cervix, low grade (CIN 1)    Hiatal hernia    a.) s/p repair concurrently with biliopancreatic diversion with duodenal switch procedure in 2017   History of diabetes mellitus, type II    a.) resolved after bariatric surgery done in 2017   History of hiatal hernia    History of kidney stones    History of obstructive sleep apnea    a.) resolved after bariatric surgery done in 2017; has had repeat PSG testing that was negative since surgery   HTN (hypertension)    Hydronephrosis, right    Hyperlipidemia    Iron deficiency anemia    MRSA carrier    Multinodular goiter    NAFL (nonalcoholic fatty liver)    Nephrolithiasis    New onset atrial fibrillation (HCC) 09/29/2023   a.) Dx'd 09/2023; b.) CHA2DS2VASc = 3 (sex, HTN,  T2DM) as of  01/24/2024; b.) rate/rhythm maintained intrinsically without need for pharmacological intervention; chronically anticoagulated with apixaban    On apixaban  therapy    Osteoporosis    Primary biliary cirrhosis (HCC)    Skin cancer of nose    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 Therese Flash @[facility] , [place]   BRACHIOPLASTY Bilateral    BREAST BIOPSY Right 2017   FIBROCYSTIC CHANGES WITH CALCIFICATIONS   CESAREAN SECTION     x 2   COLONOSCOPY WITH PROPOFOL  N/A 09/11/2019   Procedure: COLONOSCOPY WITH PROPOFOL ;  Surgeon: Luke Salaam, MD;  Location: [facility];  Service: Gastroenterology;  Laterality: N/A;   CYSTOSCOPY WITH RETROGRADE PYELOGRAM, URETEROSCOPY AND STENT PLACEMENT Right 02/06/2022   CYSTOSCOPY WITH STENT PLACEMENT Right 09/10/2017   Procedure: CYSTOSCOPY WITH STENT PLACEMENT;  Surgeon: Geraline Knapp, MD;  Location: [facility];  Service: Urology;  Laterality: Right;   CYSTOSCOPY/URETEROSCOPY/HOLMIUM LASER/STENT PLACEMENT Right 04/24/2019   Procedure: CYSTOSCOPY/URETEROSCOPY/STENT PLACEMENT/  RIGHT RETROGRADE;  Surgeon: Christina Coyer, MD;  Location: [facility];  Service: Urology;  Laterality: Right;   DX LAPAROSCOPY/  EGD/  HIATAL HERNIA REPAIR CLOSURE OF INTERNAL MENSENTRY DEFECT  10/28/2017   @[facility] , [place]   EXTRACORPOREAL SHOCK WAVE LITHOTRIPSY N/A 01/30/2019   Procedure: EXTRACORPOREAL SHOCK WAVE LITHOTRIPSY (ESWL) LEFT/ POSSIBLE RIGHT;  Surgeon: Christina Coyer, MD;  Location: [facility];  Service: Urology;  Laterality: N/A;   EXTRACORPOREAL SHOCK WAVE LITHOTRIPSY Right 03/20/2019   Procedure: EXTRACORPOREAL SHOCK WAVE LITHOTRIPSY (ESWL);  Surgeon: Trent Frizzle, MD;  Location: [facility];  Service: Urology;  Laterality: Right;   EXTRACORPOREAL SHOCK WAVE LITHOTRIPSY Left 10/30/2021   Procedure: EXTRACORPOREAL SHOCK WAVE LITHOTRIPSY (ESWL);  Surgeon: Andrez Banker, MD;  Location: [facility];  Service: Urology;  Laterality: Left;   LAPAROSCOPIC CHOLECYSTECTOMY  10/15/2016   @[facility]  , [place]   LAPAROTOMY N/A 01/26/2024   Procedure: LAPAROTOMY, EXPLORATORY of abdominal wall wounds, for removal of foreign body; ultrasound-guided aspiratio of seroma;  Surgeon: Flynn Hylan, MD;  Location: [facility];  Service: General;  Laterality: N/A;   LIPOSUCTION     PANNICULECTOMY     2022   TONSILLECTOMY AND ADENOIDECTOMY  child   URETEROSCOPY WITH HOLMIUM LASER LITHOTRIPSY Right 09/10/2017   Procedure: URETEROSCOPY WITH HOLMIUM LASER LITHOTRIPSY;  Surgeon: Geraline Knapp, MD;  Location: [facility];  Service: Urology;  Laterality: Right;    Allergies  Allergen Reactions   Ivp Dye [Iodinated Contrast Media]     Patient 'codes' with IV contrast   Latex Hives, Shortness Of Breath and Cough   Tolmetin Hives   Gallium Nitrate Hives    AND Gadolinium Derivatives    Insulin  Detemir Hives and Swelling    Lump at injection site   Nsaids Hives    Per pt stated has to take ibuprofen with a antihistimine   Gadolinium Derivatives Hives    Pt c/o itching after contrast injection 03/15/19 @ 8:45am. [facility] Per Dr. Nereida Banning document this as allergic reaction.     Current Outpatient Medications  Medication Sig Dispense Refill   amLODipine  (NORVASC ) 5 MG tablet Take 1 tablet (5 mg total) by mouth in the morning and at bedtime. 60 tablet 11   apixaban  (ELIQUIS ) 5 MG TABS tablet Take 1 tablet (5 mg total) by mouth 2 (two) times daily. (Patient taking  differently: Take 5 mg by mouth daily.) 60 tablet 11   Multiple Vitamins-Minerals (BARIATRIC MULTIVITAMINS/IRON PO) Take 1 tablet by mouth daily.     ursodiol  (ACTIGALL ) 250 MG tablet Take 500 mg by mouth 2 (two) times daily.     No current facility-administered medications for this visit.    Family History Family History  Problem Relation Age of Onset   Seizures Mother    Alcohol abuse Mother    Hyperlipidemia Mother    Hypertension Mother    Lymphoma Mother        primary site : brain   Leukemia Mother    Parkinsonism Father    Hyperlipidemia Father    Hypertension Father    Parkinson's disease Father    Drug abuse Sister    Cancer Maternal Grandmother        breast cancer   Breast cancer Maternal Grandmother 67   Liver cancer Paternal Grandfather    Kidney cancer Neg Hx    Prostate cancer Neg Hx       Social History Social History   Tobacco Use   Smoking status: Never  Passive exposure: Never   Smokeless tobacco: Never  Vaping Use   Vaping status: Never Used  Substance Use Topics   Alcohol use: No    Alcohol/week: 0.0 standard drinks of alcohol   Drug use: Never      Physical Exam Blood pressure 123/78, pulse 65, temperature 98 F (36.7 C), height 5' 6.5" (1.689 m), last menstrual period 07/11/2016, SpO2 99%.   CONSTITUTIONAL: Well developed, and nourished, appropriately responsive and aware without distress.   EYES: Sclera non-icteric.   EARS, NOSE, MOUTH AND THROAT: The oropharynx is clear. Oral mucosa is pink and moist.    Hearing is intact to voice.  NECK: Trachea is midline, and there is no jugular venous distension.  LYMPH NODES:  Lymph nodes in the neck are not appreciated. RESPIRATORY:  Lungs are clear, and breath sounds are equal bilaterally.  Normal respiratory effort without pathologic use of accessory muscles. CARDIOVASCULAR: Heart is regular in rate and rhythm.   Well perfused.  GI: The abdomen is healed and the midline scar from her sinus  tract.  Right lower quadrant scarring from abdominal plasty has some areas of tenderness closer to the midline consistent with the previously noted seroma on CT scan exam.  Otherwise soft, nontender, and nondistended.  MUSCULOSKELETAL:  Symmetrical muscle tone appreciated in all four extremities.    SKIN: Skin turgor is normal. No pathologic skin lesions appreciated.  NEUROLOGIC:  Motor and sensation appear grossly normal.  Cranial nerves are grossly without defect. PSYCH:  Alert and oriented to person, place and time. Affect is appropriate for situation.  Data Reviewed I have personally reviewed what is currently available of the patient's imaging, recent labs and medical records.   Labs:     Latest Ref Rng & Units 03/15/2024    9:37 AM 02/22/2024    7:18 AM 09/30/2023    4:36 AM  CBC  WBC 3.4 - 10.8 x10E3/uL 5.9  6.5  5.1   Hemoglobin 11.1 - 15.9 g/dL 16.1  09.6  04.5   Hematocrit 34.0 - 46.6 % 33.4  35.8  33.9   Platelets 150 - 450 x10E3/uL 256  271  252       Latest Ref Rng & Units 02/22/2024    7:18 AM 12/15/2023    2:23 PM 10/12/2023    9:40 AM  CMP  Glucose 70 - 99 mg/dL 73  85  409   BUN 6 - 24 mg/dL 21  13  16    Creatinine 0.57 - 1.00 mg/dL 8.11  9.14  7.82   Sodium 134 - 144 mmol/L 143  145  146   Potassium 3.5 - 5.2 mmol/L 4.4  4.0  4.2   Chloride 96 - 106 mmol/L 108  110  110   CO2 20 - 29 mmol/L 20  23  23    Calcium  8.7 - 10.2 mg/dL 9.3  8.7  8.8   Total Protein 6.0 - 8.5 g/dL 6.9     Total Bilirubin 0.0 - 1.2 mg/dL <9.5     Alkaline Phos 44 - 121 IU/L 220     AST 0 - 40 IU/L 25     ALT 0 - 32 IU/L 20       Imaging: Radiological images reviewed:  CLINICAL DATA:  Right lower quadrant and anterior abdominal wall swelling. History of panniculectomy.   EXAM: CT ABDOMEN AND PELVIS WITHOUT CONTRAST   TECHNIQUE: Multidetector CT imaging of the abdomen and pelvis was performed following the standard protocol without IV  contrast.   RADIATION DOSE REDUCTION: This exam  was performed according to the departmental dose-optimization program which includes automated exposure control, adjustment of the mA and/or kV according to patient size and/or use of iterative reconstruction technique.   COMPARISON:  06/03/2023   FINDINGS: Lower chest: Unremarkable.   Hepatobiliary: No suspicious focal abnormality in the liver on this study without intravenous contrast. Gallbladder is surgically absent. No intrahepatic or extrahepatic biliary dilation.   Pancreas: No focal mass lesion. No dilatation of the main duct. No intraparenchymal cyst. No peripancreatic edema.   Spleen: No splenomegaly. No suspicious focal mass lesion.   Adrenals/Urinary Tract: No adrenal nodule or mass. 2 mm nonobstructing stone identified lower pole right kidney. Right ureter unremarkable. 2 mm nonobstructing stone identified interpolar left kidney with a 1 mm interpolar nonobstructing left renal stone evident. 8 x 7 mm nonobstructing stone identified lower pole left kidney. Left ureter unremarkable. The urinary bladder appears normal for the degree of distention.   Stomach/Bowel: Surgical changes are noted in the stomach. No small bowel wall thickening. No small bowel dilatation. Patulous small bowel anastomosis noted right abdomen. Fluid-filled mildly distended small bowel is seen in the pelvis measuring up 2.9 cm diameter. Assessment for wall thickening is not possible due to lack of oral and intravenous contrast material. No substantial perienteric edema. Terminal ileum unremarkable. The appendix is not well visualized, but there is no edema or inflammation in the region of the cecal tip to suggest appendicitis. No gross colonic mass. No colonic wall thickening.   Vascular/Lymphatic: No abdominal aortic aneurysm. There is no gastrohepatic or hepatoduodenal ligament lymphadenopathy. No retroperitoneal or mesenteric lymphadenopathy. No pelvic sidewall lymphadenopathy.    Reproductive: Uterus not well seen given lack of contrast material. There is no adnexal mass.   Other: No intraperitoneal free fluid.   Musculoskeletal: 11.1 x 1.5 x 4.4 cm ill-defined fluid collection is identified in the deep subcutaneous fat of the lower right anterior abdominal wall, just superficial to the rectus fascia. There is an associated midline component in the suprapubic region that measures on the order of 1.7 x 3.0 x 2.9 cm. These collections likely communicate with each other. Findings could reflect a postoperative seroma. Abscess would also be a consideration although assessment is limited by lack of intravenous contrast material. There is some stranding in the surrounding subcutaneous fat.   No worrisome lytic or sclerotic osseous abnormality.   IMPRESSION: 1. 11.1 x 1.5 x 4.4 cm ill-defined fluid collection in the deep subcutaneous fat of the lower right anterior abdominal wall, just superficial to the rectus fascia. There is an associated midline component in the suprapubic region that measures on the order of 1.7 x 3.0 x 2.9 cm. These collections likely communicate with each other. Findings could reflect a postoperative seroma. Abscess would also be a consideration although assessment is limited by lack of intravenous contrast material. Stranding in the surrounding subcutaneous fat raises the question of cellulitis. 2. Fluid-filled mildly distended small bowel in the pelvis measuring up to 2.9 cm diameter. Assessment for wall thickening is not possible due to lack of oral and intravenous contrast material. No substantial perienteric edema. Imaging features are nonspecific and may be related to ileus. 3. Bilateral nonobstructing renal stones.   Electronically Signed: By: Donnal Fusi M.D. On: 01/10/2024 10:01 Within last 24 hrs: No results found.  Assessment    Symptomatic/chronic seroma, unlikely subclinical abscess of the previous abdominoplasty  site. 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   Paroxysmal atrial fibrillation with RVR (HCC) 09/29/2023   Open abdominal wall wound 09/29/2023   Hypokalemia 09/29/2023   Near syncope 09/29/2023   Left lower quadrant abdominal pain 06/03/2023   Wound dehiscence, surgical, sequela 05/10/2023   Poison ivy 05/03/2023   Overeating 02/22/2023   Abnormal brain MRI 02/22/2023   Tremor 01/11/2023   Abdominal hernia 09/18/2022   Abdominal distention 01/09/2022   Anemia 07/01/2021   Dysuria 07/01/2021   Dyspareunia, female 06/30/2021   Low back pain 12/09/2020   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   Headache 07/05/2020   Osteomalacia 12/27/2019   History of weight loss 11/28/2019   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   Localized osteoporosis without current pathological fracture 06/26/2019   Hypocalcemia 06/26/2019   Secondary hyperparathyroidism (HCC) 06/26/2019   Multinodular goiter 06/26/2019   Dysphagia 05/05/2019   Osteoporosis 05/03/2019   Memory loss 02/13/2019   Elevated liver function tests 10/13/2017   Epigastric abdominal pain 10/13/2017   History of kidney stones 10/13/2017   Hypertension 10/13/2017   Status post bariatric surgery 09/03/2017   Right ureteral calculus 08/31/2017   Hydronephrosis with urinary obstruction due to ureteral calculus 08/31/2017   Nephrolithiasis 08/31/2017   Screen for STD (sexually transmitted disease) 07/26/2017   Sleep apnea 10/06/2016   Candidal intertrigo 09/21/2016   H/O gastric bypass 09/21/2016   Hiatal hernia 04/21/2016   Elevated liver enzymes 09/04/2014   Vitamin D  deficiency 09/03/2014   Routine general medical examination at a health care facility 01/11/2014   T2DM (type 2 diabetes mellitus) (HCC) 01/11/2014    Plan    Under anesthesia we could open and explore the wound looking  for the source of the persistent pain and seroma.  It is possible the wound may be left open for best healing, or resume the close drain for an interval.  She would like to pursue this.  Despite the fact that I could not guarantee her any certain resolution of her pain syndrome, nor guarantee prevention of the recurrent seroma or persistent issue.  She accepts the risks of anesthesia, bleeding, infection and persistence of current problems.  With her questions answered she desires to proceed.  This will be an exploration of the abdominal wall, with likely drainage of chronic/longstanding symptomatic seroma.   Face-to-face time spent with the patient and accompanying care providers(if present) was 40 minutes, spent counseling, educating, and coordinating care of the patient.    These notes generated with voice recognition software. I apologize for typographical errors.  Flynn Hylan M.D., FACS 03/16/2024, 2:11 PM

## 2024-03-17 ENCOUNTER — Telehealth: Payer: Self-pay | Admitting: Surgery

## 2024-03-17 NOTE — Telephone Encounter (Signed)
 Patient has been advised of Pre-Admission date/time, and Surgery date at [facility].  Surgery Date: 03/29/24 Preadmission Testing Date: 03/23/24 (phone 1p-4p)  Patient informed of the scheduling process and surgery information given at time of office visit.   Patient has been made aware to call [phone], between 1-3:00pm the day before surgery, to find out what time to arrive for surgery.

## 2024-03-20 ENCOUNTER — Encounter: Payer: Self-pay | Admitting: Cardiology

## 2024-03-20 ENCOUNTER — Ambulatory Visit: Payer: Managed Care, Other (non HMO) | Attending: Cardiology | Admitting: Cardiology

## 2024-03-20 ENCOUNTER — Ambulatory Visit: Admitting: Family

## 2024-03-20 VITALS — BP 158/74 | HR 52 | Ht 66.5 in | Wt 171.0 lb

## 2024-03-20 DIAGNOSIS — Z01810 Encounter for preprocedural cardiovascular examination: Secondary | ICD-10-CM | POA: Diagnosis not present

## 2024-03-20 DIAGNOSIS — I159 Secondary hypertension, unspecified: Secondary | ICD-10-CM | POA: Diagnosis not present

## 2024-03-20 DIAGNOSIS — I480 Paroxysmal atrial fibrillation: Secondary | ICD-10-CM

## 2024-03-20 DIAGNOSIS — E876 Hypokalemia: Secondary | ICD-10-CM

## 2024-03-20 MED ORDER — AMLODIPINE BESYLATE 5 MG PO TABS: 5.0000 mg | ORAL_TABLET | ORAL | 3 refills | Status: DC

## 2024-03-20 NOTE — Patient Instructions (Addendum)
 Medication Instructions:  Your physician has recommended you make the following change in your medication:   TAKE Amlodipine  5 mg in the morning and take 2.5 mg at bedtime.  OK to hold Eliquis  3 days before your procedure.  *If you need a refill on your cardiac medications before your next appointment, please call your pharmacy*  Lab Work: None  If you have labs (blood work) drawn today and your tests are completely normal, you will receive your results only by: MyChart Message (if you have MyChart) OR A paper copy in the mail If you have any lab test that is abnormal or we need to change your treatment, we will call you to review the results.  Testing/Procedures: None  Follow-Up: At [facility], you and your health needs are our priority.  As part of our continuing mission to provide you with exceptional heart care, our providers are all part of one team.  This team includes your primary Cardiologist (physician) and Advanced Practice Providers or APPs (Physician Assistants and Nurse Practitioners) who all work together to provide you with the care you need, when you need it.  Your next appointment:   4 month(s)  Provider:   Timothy Gollan, MD or Ronald Cockayne, NP

## 2024-03-20 NOTE — Progress Notes (Signed)
 Cardiology Office Note:  .   Date:  03/20/2024  ID:  [name], DOB 1966-01-05, MRN [id number] PCP: Calista Catching, FNP  [facility] Providers Cardiologist:  Belva Boyden, MD    History of Present Illness: .   [name] is a 58 y.o. female with a past medical history of type 2 diabetes, primary biliary cirrhosis, GERD, status post bariatric surgery with fat-soluble vitamin deficiency, hypertension, paroxysmal atrial fibrillation, who is here today to follow-up on her paroxysmal atrial fibrillation.   She presented to the [facility] emergency department via EMS from work with complaints of dizziness and palpitations.  On rhythm strip from EMS she was noted to be in atrial fibrillation with RVR.  She states she had palpitations that started while at work with occasional episodes in the past.  She was found to be: Intermittently and out of atrial fibrillation with rates as high as 160s per EMS.  She was not given any medications en route to the emergency department.  She states that she feels like her chest is pounding when it happens she feels short of breath and that she may pass out. Felt as if she was having tunnel vision and had to lay her head down at her workstation.  States that prior to the episode starting she did have an energy drink which is not abnormal for her but denied any energy shots.  She denies any illicit drug use, alcohol use or other stimulants.  She does not have a history of arrhythmia.  She has never seen cardiology in the past and has no significant medical history of cardiac problems.Initial vital signs: Blood pressure 215/109, pulse of 77, respirations 19, temperature 98.1. Pertinent labs: Potassium 2.9, alkaline phosphatase 154, D-dimer 0.65, TSH 0.908, free T41.08, high-sensitivity troponin 11 x 2. Imaging: Chest x-ray with no acute cardiopulmonary abnormality; VQ lung scan showed normal perfusion exam with no evidence of acute pulmonary  embolism. Medications administered in the emergency department potassium chloride  10 mill equivalents IVPB, potassium chloride  40 mEq oral, Lovenox  70 mg subcu, diltiazem  10 mg IV.  Echocardiogram was completed which revealed an LVEF 55 to 60%, no RWMA, G1 DD, and no valvular abnormalities.  She was started on apixaban  5 mg twice daily for CHA2DS2-VASc score of at least 3 for stroke prophylaxis.  She was also continued on metoprolol  succinate 12.5 mg daily but dosing was limited secondary to baseline bradycardia.  It was also recommended she consider Apple Watch to monitor for atrial fibrillation.  She had converted to sinus rhythm after receiving IV diltiazem  in the emergency department.  She was considered stable and was discharged home on 09/30/2023.   She was last seen in clinic 12/21/2023.  At that time she had been doing well from a cardiac perspective.  Unfortunately she been taking her apixaban  since the first 30 days and she was placed on the medication.  Blood pressure been slightly elevated and she continued to complain of headaches.  She was restarted on her apixaban  5 mg twice daily for CHA2DS2-VASc of at least 3 for stroke prophylaxis and amlodipine  was increased to twice daily.  She was also scheduled for repeat labs.  She returns to clinic today stating that she has been doing well from the cardiac perspective.  She recently had surgery for a seroma along abdominoplasty incision with pain and pressure in that area.  Unfortunately she has a second which has required an additional surgery.  Stated that seroma been present for  approximately 3 years.  They attempted aspiration under ultrasound guidance and was unable to withdraw any fluid.  States that she has not been taking her medications unfortunately as she thought she only had to take apixaban  once daily.  She has not now going to take it twice daily as previously prescribed.  She does have questions about coming off of her apixaban  today.  She  denies any bleeding with no blood noted in her urine or stool.  She did state that her primary care provider was concerned due to slight drop in her hemoglobin from 11-10.3.  Previous history of hypokalemia has resolved and her potassium has remained stable.  She denies any chest pain, palpitations, chest pressure, shortness of breath.  She denies any hospitalizations or visits to the emergency department.  ROS: 10 point review of system has been reviewed and considered negative except ones been listed in the HPI  Studies Reviewed: Aaron Aas   EKG Interpretation Date/Time:  Monday Mar 20 2024 08:06:55 EDT Ventricular Rate:  52 PR Interval:  154 QRS Duration:  94 QT Interval:  446 QTC Calculation: 414 R Axis:   -13  Text Interpretation: Sinus bradycardia When compared with ECG of 21-Dec-2023 08:06, No significant change since last tracing Confirmed by Ronald Cockayne ([id number]) on 03/20/2024 8:10:42 AM    2D echo 09/30/2023 1. Left ventricular ejection fraction, by estimation, is 55 to 60%. The  left ventricle has normal function. The left ventricle has no regional  wall motion abnormalities. Left ventricular diastolic parameters are  consistent with Grade I diastolic  dysfunction (impaired relaxation).   2. Right ventricular systolic function is normal. The right ventricular  size is normal. There is normal pulmonary artery systolic pressure. The  estimated right ventricular systolic pressure is 18.4 mmHg.   3. The mitral valve is normal in structure. No evidence of mitral valve  regurgitation. No evidence of mitral stenosis.   4. The aortic valve is tricuspid. Aortic valve regurgitation is not  visualized. Aortic valve sclerosis is present, with no evidence of aortic  valve stenosis.   5. The inferior vena cava is normal in size with greater than 50%  respiratory variability, suggesting right atrial pressure of 3 mmHg.  Risk Assessment/Calculations:    CHA2DS2-VASc Score = 3   This indicates a  3.2% annual risk of stroke. The patient's score is based upon: CHF History: 0 HTN History: 1 Diabetes History: 1 Stroke History: 0 Vascular Disease History: 0 Age Score: 0 Gender Score: 1    HYPERTENSION CONTROL Vitals:   03/20/24 0757 03/20/24 0927  BP: (!) 160/78 (!) 158/74    The patient's blood pressure is elevated above target today.  In order to address the patient's elevated BP: A current anti-hypertensive medication was adjusted today.          Physical Exam:   VS:  BP (!) 158/74 (BP Location: Left Arm, Patient Position: Sitting, Cuff Size: Normal)   Pulse (!) 52   Ht 5' 6.5" (1.689 m)   Wt 171 lb (77.6 kg)   LMP 07/11/2016 (Exact Date)   SpO2 97%   BMI 27.19 kg/m    Wt Readings from Last 3 Encounters:  03/20/24 171 lb (77.6 kg)  02/15/24 170 lb 12.8 oz (77.5 kg)  02/11/24 176 lb 6.4 oz (80 kg)    GEN: Well nourished, well developed in no acute distress NECK: No JVD; No carotid bruits CARDIAC: RRR, bradycardic, no murmurs, rubs, gallops RESPIRATORY:  Clear to auscultation without rales,  wheezing or rhonchi  ABDOMEN: Soft, non-tender, non-distended EXTREMITIES:  No edema; No deformity   ASSESSMENT AND PLAN: .   Paroxysmal atrial fibrillation with EKG today revealing sinus bradycardia with a rate of 52 with no significant changes noted.  She states that she has not had any reoccurrence of atrial fibrillation with correction and electrolytes.  Unfortunately she has only been taking her apixaban  once daily versus twice daily which was explained the reasoning why today to the patient for CHA2DS2-VASc score of at least 3 for stroke prophylaxis.  Without any reoccurrence of atrial fibrillation she is requesting if possible to come off of anticoagulation altogether.  She has been advised at this point to continue with her apixaban  5 mg twice daily.  She denies any bleeding with no blood noted in her stool or urine.  Essential hypertension with blood pressure today  158/74.  Blood pressure is slightly elevated.  Patient stated that previously amlodipine  was increased to twice daily but she did not increase the medication to twice daily as she felt it was making her blood pressure too low.  She does note that she was having headaches today due to elevated blood pressure present.  She recently had labs done at her primary care provider's office and she stated all of her labs were stable.  She has been encouraged to take amlodipine  5 mg in the morning and 2.5 mg at bedtime.  She has also been advised to maintain blood pressure log with monitor blood pressure 1 to 2 hours postmedication administration as well.  Hypokalemia with recent potassium 4.4.  Not requiring potassium supplementation at this time.  Continue to monitor potassium levels periodically.  Preoperative cardiovascular examination with patient does not require any further testing prior to upcoming surgical procedure and may proceed at acceptable risk.  She has been advised that she can hold her apixaban  for 3 days prior to procedure.       Dispo: Patient return to clinic to see MD/APP in 4 months or sooner if needed  Signed, Delmar Dondero, NP

## 2024-03-23 ENCOUNTER — Other Ambulatory Visit: Payer: Self-pay

## 2024-03-23 ENCOUNTER — Ambulatory Visit: Payer: Self-pay | Admitting: Surgery

## 2024-03-23 ENCOUNTER — Encounter
Admission: RE | Admit: 2024-03-23 | Discharge: 2024-03-23 | Disposition: A | Discharge status: Home / Self Care | Source: Ambulatory Visit | Attending: Surgery | Admitting: Surgery

## 2024-03-23 DIAGNOSIS — S301XXD Contusion of abdominal wall, subsequent encounter: Secondary | ICD-10-CM

## 2024-03-23 NOTE — Patient Instructions (Addendum)
 Your procedure is scheduled on: Wednesday 03/29/24 To find out your arrival time, please call [phone] between 1PM - 3PM on:  Tuesday 03/28/24  Report to the Registration Desk on the 1st floor of the [facility]. Free Valet parking is available.  If your arrival time is 6:00 am, do not arrive before that time as the [facility] entrance doors do not open until 6:00 am.  REMEMBER: Instructions that are not followed completely may result in serious medical risk, up to and including death; or upon the discretion of your surgeon and anesthesiologist your surgery may need to be rescheduled.  Do not eat food or drink any liquids after midnight the night before surgery.  No gum chewing or hard candies.  One week prior to surgery: Stop Anti-inflammatories (NSAIDS) such as Advil, Aleve, Ibuprofen, Motrin, Naproxen, Naprosyn and Aspirin based products such as Excedrin, Goody's Powder, BC Powder. You may however, continue to take Tylenol  if needed for pain up until the day of surgery.  Stop ALL OVER THE COUNTER supplements and vitamins until after surgery.  Continue taking all prescribed medications with the exception of the following: Eliquis  on hold  TAKE ONLY THESE MEDICATIONS THE MORNING OF SURGERY WITH A SIP OF WATER:  amLODipine  (NORVASC ) 5 MG tablet   No Alcohol for 24 hours before or after surgery.  No Smoking including e-cigarettes for 24 hours before surgery.  No chewable tobacco products for at least 6 hours before surgery.  No nicotine patches on the day of surgery.  Do not use any "recreational" drugs for at least a week (preferably 2 weeks) before your surgery.  Please be advised that the combination of cocaine and anesthesia may have negative outcomes, up to and including death. If you test positive for cocaine, your surgery will be cancelled.  On the morning of surgery brush your teeth with toothpaste and water, you may rinse your mouth with mouthwash if you wish. Do  not swallow any toothpaste or mouthwash.  Use CHG Soap or wipes as directed on instruction sheet.  Do not wear lotions, powders, or perfumes.   Do not shave body hair from the neck down 48 hours before surgery.  Wear comfortable clothing (specific to your surgery type) to the hospital.  Do not wear jewelry, make-up, hairpins, clips or nail polish.  For welded (permanent) jewelry: bracelets, anklets, waist bands, etc.  Please have this removed prior to surgery.  If it is not removed, there is a chance that hospital personnel will need to cut it off on the day of surgery. Contact lenses, hearing aids and dentures may not be worn into surgery.  Do not bring valuables to the hospital. [facility] is not responsible for any missing/lost belongings or valuables.   Notify your doctor if there is any change in your medical condition (cold, fever, infection).  If you are being discharged the day of surgery, you will not be allowed to drive home. You will need a responsible individual to drive you home and stay with you for 24 hours after surgery.   If you are taking public transportation, you will need to have a responsible individual with you.  If you are being admitted to the hospital overnight, leave your suitcase in the car. After surgery it may be brought to your room.  In case of increased patient census, it may be necessary for you, the patient, to continue your postoperative care in the Same Day Surgery department.  After surgery, you can help  prevent lung complications by doing breathing exercises.  Take deep breaths and cough every 1-2 hours. Your doctor may order a device called an Incentive Spirometer to help you take deep breaths. When coughing or sneezing, hold a pillow firmly against your incision with both hands. This is called "splinting." Doing this helps protect your incision. It also decreases belly discomfort.  Surgery Visitation Policy:  Patients undergoing a surgery or  procedure may have two family members or support persons with them as long as the person is not COVID-19 positive or experiencing its symptoms.   Please call the Pre-admissions Testing Dept. at [phone] if you have any questions about these instructions.     Preparing for Surgery with CHLORHEXIDINE  GLUCONATE (CHG) Soap  Chlorhexidine  Gluconate (CHG) Soap  o An antiseptic cleaner that kills germs and bonds with the skin to continue killing germs even after washing  o Used for showering the night before surgery and morning of surgery  Before surgery, you can play an important role by reducing the number of germs on your skin.  CHG (Chlorhexidine  gluconate) soap is an antiseptic cleanser which kills germs and bonds with the skin to continue killing germs even after washing.  Please do not use if you have an allergy to CHG or antibacterial soaps. If your skin becomes reddened/irritated stop using the CHG.  1. Shower the NIGHT BEFORE SURGERY and the MORNING OF SURGERY with CHG soap.  2. If you choose to wash your hair, wash your hair first as usual with your normal shampoo.  3. After shampooing, rinse your hair and body thoroughly to remove the shampoo.  4. Use CHG as you would any other liquid soap. You can apply CHG directly to the skin and wash gently with a scrungie or a clean washcloth.  5. Apply the CHG soap to your body only from the neck down. Do not use on open wounds or open sores. Avoid contact with your eyes, ears, mouth, and genitals (private parts). Wash face and genitals (private parts) with your normal soap.  6. Wash thoroughly, paying special attention to the area where your surgery will be performed.  7. Thoroughly rinse your body with warm water.  8. Do not shower/wash with your normal soap after using and rinsing off the CHG soap.  9. Pat yourself dry with a clean towel.  10. Wear clean pajamas to bed the night before surgery.  12. Place clean sheets on  your bed the night of your first shower and do not sleep with pets.  13. Shower again with the CHG soap on the day of surgery prior to arriving at the hospital.  14. Do not apply any deodorants/lotions/powders.  15. Please wear clean clothes to the hospital.

## 2024-03-24 ENCOUNTER — Encounter: Payer: Self-pay | Admitting: Surgery

## 2024-03-24 NOTE — Progress Notes (Signed)
 Perioperative / Anesthesia Services  Pre-Admission Testing Clinical Review / Pre-Operative Anesthesia Consult  Date: 03/24/24  PATIENT DEMOGRAPHICS: Name: [name] DOB: [birth year] MRN:   [id number]  Note: Available PAT nursing documentation and vital signs have been reviewed. Clinical nursing staff has updated patient's PMH/PSHx, current medication list, and drug allergies/intolerances to ensure complete and comprehensive history available to assist care teams in MDM as it pertains to the aforementioned surgical procedure and anticipated anesthetic course. Extensive review of available clinical information personally performed. [name] PMH and PSHx updated with any diagnoses/procedures that  may have been inadvertently omitted during his intake with the pre-admission testing department's nursing staff.  PLANNED SURGICAL PROCEDURE(S):    Case: [id number] Date/Time: 03/29/24 0715   Procedure: LAPAROTOMY, EXPLORATORY (Abdomen) - Abdominal wall, drain/resection seroma cavity   Anesthesia type: General   Diagnosis: Abdominal wall seroma, subsequent encounter [S30.1XXD]   Pre-op diagnosis: Abdominal wall seroma   Location: [facility] OR ROOM 09 / [facility]   Surgeons: Flynn Hylan, MD     CLINICAL DISCUSSION: [name] is a 58 y.o. female who is submitted for pre-surgical anesthesia review and clearance prior to her undergoing the above procedure. Patient has never been a smoker in the past. Pertinent PMH includes: PAF, diastolic dysfunction, bradycardia, HTN, HLD, T2DM, multinodular goiter, asthma, OSAH (resolved with weight loss), GERD (no daily Tx), hiatal hernia (s/p repair), primary biliary cirrhosis, s/p bariatric surgery (biliopancreatic diversion with duodenal switch), abdominal wall seroma, IDA, nephrolithiasis.   Patient is followed by cardiology (Gollan, MD). She was last seen in the cardiology clinic on 03/20/2024; notes reviewed. At the  time of her clinic visit, patient doing well overall from a cardiovascular perspective. Patient denied any chest pain, shortness of breath, PND, orthopnea, palpitations, significant peripheral edema, weakness, fatigue, vertiginous symptoms, or presyncope/syncope. Patient with a past medical history significant for cardiovascular diagnoses. Documented physical exam was grossly benign, providing no evidence of acute exacerbation and/or decompensation of the patient's known cardiovascular conditions.  Most recent TTE performed on 09/30/2023 revealed a normal left ventricular systolic function with an EF of 55-60%. There were no regional wall motion abnormalities. Left ventricular diastolic Doppler parameters consistent with abnormal relaxation (G1DD). Right ventricular size and function normal with a TAPSE measuring 2.3 cm  (normal range >/= 1.6 cm).  RVSP = 18.4 mmHg. aortic valve noted to be sclerotic.  There was no evidence of significant valvular regurgitation.  All transvalvular gradients were noted to be normal providing no evidence suggestive of valvular stenosis. Aorta normal in size with no evidence of ectasia or aneurysmal dilatation.  Patient with a newly diagnosed atrial fibrillation in 09/2023; CHA2DS2-VASc Score = 3 (sex, HTN, T2DM).  Rate and rhythm currently being maintained intrinsically without the need for pharmacological intervention.  Patient is chronically anticoagulated using standard dose apixaban .  Patient reportedly had only been taking her anticoagulant once daily (prescribed BID); no evidence or reports of GI/GU related bleeding.  Blood pressure in the clinic was documented at 160/78 mmHg on prescribed CCB (amlodipine ) monotherapy.  Patient was not taking any type of lipid-lowering therapies for her HLD diagnosis and ASCVD prevention.  Patient formally had an OSAH diagnosis, however following significant resulting from her biliopancreatic diversion with duodenal switch procedure, patient  no longer requires the use of nocturnal PAP therapy. T2DM well-controlled with diet lifestyle modification alone following the aforementioned surgical procedure and subsequent weight loss. Patient is able to complete all of her ADL/IADLs without cardiovascular limitation.  Per the [facility], patient is able to achieve at least 4 METS of physical activity without experiencing any significant degree of angina/anginal equivalent symptoms.  Patient was encouraged to restart medications as prescribed.  No changes were made to her medication regimen during her visit with cardiology.  Patient scheduled to follow-up with outpatient cardiology in 4 months or sooner if needed.   [name] is scheduled for an elective LAPAROTOMY, EXPLORATORY (Abdomen) on 03/29/2024 with Dr. Flynn Hylan, MD.  Given patient's past medical history significant for cardiovascular diagnoses, presurgical cardiac clearance was sought by the PAT team. Per cardiology, "based ACC/AHA guidelines, the patient's past medical history, and the amount of time since her last clinic visit, this patient would be at an overall ACCEPTABLE risk for the planned procedure without further cardiovascular testing or intervention at this time".   Again, this patient is on daily oral anticoagulation therapy using a DOAC medication.  She has been instructed on recommendations for holding her apixaban  for 3 days prior to her procedure with plans to restart as soon as postoperative bleeding risk felt to be minimized by his primary attending surgeon. The patient has been instructed that her last dose of should be on 03/25/2024.  Patient denies previous perioperative complications with anesthesia in the past. In review her EMR, it is noted that patient underwent a general anesthetic course here at [facility] in 01/2024 without documented complications.   MOST RECENT VITAL SIGNS:    03/23/2024    3:38 PM 03/20/2024     9:27 AM 03/20/2024    7:57 AM  Vitals with BMI  Height 5' 6.5"  5' 6.5"  Weight 170 lbs  171 lbs  BMI 27.03  27.19  Systolic  158 160  Diastolic  74 78  Pulse   52   PROVIDERS/SPECIALISTS: NOTE: Primary physician provider listed below. Patient may have been seen by APP or partner within same practice.   PROVIDER ROLE / SPECIALTY LAST Rayetta Cain, MD  General Surgery (Surgeon) 03/16/2024  Calista Catching, FNP Primary Care Provider 01/05/2024  Belva Boyden, MD Cardiology 03/20/2024   ALLERGIES: Allergies  Allergen Reactions   Ivp Dye [Iodinated Contrast Media]     Patient 'codes' with IV contrast   Latex Hives, Shortness Of Breath and Cough   Tolmetin Hives   Gallium Nitrate Hives    AND Gadolinium Derivatives    Insulin  Detemir Hives and Swelling    Lump at injection site   Nsaids Hives    Per pt stated has to take ibuprofen with a antihistimine   Gadolinium Derivatives Hives    Pt c/o itching after contrast injection 03/15/19 @ 8:45am. [facility] Per Dr. Nereida Banning document this as allergic reaction.    CURRENT HOME MEDICATIONS: No current facility-administered medications for this encounter.    apixaban  (ELIQUIS ) 5 MG TABS tablet   Multiple Vitamins-Minerals (BARIATRIC MULTIVITAMINS/IRON PO)   ursodiol  (ACTIGALL ) 250 MG tablet   amLODipine  (NORVASC ) 5 MG tablet   HISTORY: Past Medical History:  Diagnosis Date   Asthma due to seasonal allergies    Bradycardia    Diastolic dysfunction    Dysplasia of cervix, low grade (CIN 1)    GERD (gastroesophageal reflux disease)    Hiatal hernia    a.) s/p repair concurrently with biliopancreatic diversion with duodenal switch procedure in 2017   History of diabetes mellitus, type II    a.) resolved after bariatric surgery done in 2017  History of hiatal hernia    History of kidney stones    History of obstructive sleep apnea    a.) resolved after bariatric surgery done in 2017; has had repeat PSG testing that was  negative since surgery   HTN (hypertension)    Hyperlipidemia    Iron deficiency anemia    MRSA carrier    Multinodular goiter    NAFL (nonalcoholic fatty liver)    Nephrolithiasis    New onset atrial fibrillation (HCC) 09/29/2023   a.) Dx'd 09/2023; b.) CHA2DS2VASc = 3 (sex, HTN,  T2DM) as of 01/24/2024; b.) rate/rhythm maintained intrinsically without need for pharmacological intervention; chronically anticoagulated with apixaban    On apixaban  therapy    Osteoporosis    Primary biliary cirrhosis (HCC)    Skin cancer of nose    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 Therese Flash @[facility] , [place]   BRACHIOPLASTY Bilateral    BREAST BIOPSY Right 2017   FIBROCYSTIC CHANGES WITH CALCIFICATIONS   CESAREAN SECTION     x 2   COLONOSCOPY WITH PROPOFOL  N/A 09/11/2019   Procedure: COLONOSCOPY WITH PROPOFOL ;  Surgeon: Luke Salaam, MD;  Location: [facility] ENDOSCOPY;  Service: Gastroenterology;  Laterality: N/A;   CYSTOSCOPY WITH RETROGRADE PYELOGRAM, URETEROSCOPY AND STENT PLACEMENT Right 02/06/2022   CYSTOSCOPY WITH STENT PLACEMENT Right 09/10/2017   Procedure: CYSTOSCOPY WITH STENT PLACEMENT;  Surgeon: Geraline Knapp, MD;  Location: [facility] ORS;  Service: Urology;  Laterality: Right;   CYSTOSCOPY/URETEROSCOPY/HOLMIUM LASER/STENT PLACEMENT Right 04/24/2019   Procedure: CYSTOSCOPY/URETEROSCOPY/STENT PLACEMENT/ RIGHT RETROGRADE;  Surgeon: Christina Coyer, MD;  Location: [facility];  Service:  Urology;  Laterality: Right;   DX LAPAROSCOPY/  EGD/  HIATAL HERNIA REPAIR CLOSURE OF INTERNAL MENSENTRY DEFECT  10/28/2017   @[facility] , [place]   EXTRACORPOREAL SHOCK WAVE LITHOTRIPSY N/A 01/30/2019   Procedure: EXTRACORPOREAL SHOCK WAVE LITHOTRIPSY (ESWL) LEFT/ POSSIBLE RIGHT;  Surgeon: Christina Coyer, MD;  Location: [facility] ORS;  Service: Urology;  Laterality: N/A;   EXTRACORPOREAL SHOCK WAVE LITHOTRIPSY Right 03/20/2019   Procedure: EXTRACORPOREAL SHOCK WAVE LITHOTRIPSY (ESWL);  Surgeon: Trent Frizzle, MD;  Location: [facility] ORS;  Service: Urology;  Laterality: Right;   EXTRACORPOREAL SHOCK WAVE LITHOTRIPSY Left 10/30/2021   Procedure: EXTRACORPOREAL SHOCK WAVE LITHOTRIPSY (ESWL);  Surgeon: Andrez Banker, MD;  [facility];  Service: Urology;  Laterality: Left;   LAPAROSCOPIC CHOLECYSTECTOMY  10/15/2016   @[facility]  , [place]   LAPAROTOMY N/A 01/26/2024   Procedure: LAPAROTOMY, EXPLORATORY of abdominal wall wounds, for removal of foreign body; ultrasound-guided aspiratio of seroma;  Surgeon: Flynn Hylan, MD;  Location: [facility] ORS;  Service: General;  Laterality: N/A;   LIPOSUCTION     PANNICULECTOMY     2022   TONSILLECTOMY AND ADENOIDECTOMY  child   URETEROSCOPY WITH HOLMIUM LASER LITHOTRIPSY Right 09/10/2017   Procedure: URETEROSCOPY WITH HOLMIUM LASER LITHOTRIPSY;  Surgeon: Geraline Knapp, MD;  Location: [facility] ORS;  Service: Urology;  Laterality: Right;   Family History  Problem Relation Age of Onset   Seizures Mother  Alcohol abuse Mother    Hyperlipidemia Mother    Hypertension Mother    Lymphoma Mother        primary site : brain   Leukemia Mother    Parkinsonism Father    Hyperlipidemia Father    Hypertension Father    Parkinson's disease Father    Drug abuse Sister    Cancer Maternal Grandmother        breast cancer   Breast cancer Maternal Grandmother 85   Liver cancer Paternal Grandfather    Kidney cancer Neg Hx    Prostate cancer Neg Hx     Social History   Tobacco Use   Smoking status: Never    Passive exposure: Never   Smokeless tobacco: Never  Substance Use Topics   Alcohol use: No    Alcohol/week: 0.0 standard drinks of alcohol   LABS:  No visits with results within 30 Day(s) from this visit.  Latest known visit with results is:  Patient Message on 02/15/2024  Component Date Value Ref Range Status   WBC 02/22/2024 6.5  3.4 - 10.8 x10E3/uL Final   RBC 02/22/2024 4.04  3.77 - 5.28 x10E6/uL Final   Hemoglobin 02/22/2024 11.0 (L)  11.1 - 15.9 g/dL Final   Hematocrit 13/06/6577 35.8  34.0 - 46.6 % Final   MCV 02/22/2024 89  79 - 97 fL Final   MCH 02/22/2024 27.2  26.6 - 33.0 pg Final   MCHC 02/22/2024 30.7 (L)  31.5 - 35.7 g/dL Final   RDW 46/96/2952 15.2  11.7 - 15.4 % Final   Platelets 02/22/2024 271  150 - 450 x10E3/uL Final   Neutrophils 02/22/2024 51  Not Estab. % Final   Lymphs 02/22/2024 36  Not Estab. % Final   Monocytes 02/22/2024 10  Not Estab. % Final   Eos 02/22/2024 3  Not Estab. % Final   Basos 02/22/2024 0  Not Estab. % Final   Neutrophils Absolute 02/22/2024 3.3  1.4 - 7.0 x10E3/uL Final   Lymphocytes Absolute 02/22/2024 2.3  0.7 - 3.1 x10E3/uL Final   Monocytes Absolute 02/22/2024 0.6  0.1 - 0.9 x10E3/uL Final   EOS (ABSOLUTE) 02/22/2024 0.2  0.0 - 0.4 x10E3/uL Final   Basophils Absolute 02/22/2024 0.0  0.0 - 0.2 x10E3/uL Final   Immature Granulocytes 02/22/2024 0  Not Estab. % Final   Immature Grans (Abs) 02/22/2024 0.0  0.0 - 0.1 x10E3/uL Final   Glucose 02/22/2024 73  70 - 99 mg/dL Final   BUN 84/13/2440 21  6 - 24 mg/dL Final   Creatinine, Ser 02/22/2024 0.81  0.57 - 1.00 mg/dL Final   eGFR 09/05/2535 85  >59 mL/min/1.73 Final   BUN/Creatinine Ratio 02/22/2024 26 (H)  9 - 23 Final   Sodium 02/22/2024 143  134 - 144 mmol/L Final   Potassium 02/22/2024 4.4  3.5 - 5.2 mmol/L Final   Chloride 02/22/2024 108 (H)  96 - 106 mmol/L Final   CO2 02/22/2024 20  20 - 29 mmol/L Final   Calcium   02/22/2024 9.3  8.7 - 10.2 mg/dL Final   Total Protein 64/40/3474 6.9  6.0 - 8.5 g/dL Final   Albumin  02/22/2024 4.0  3.8 - 4.9 g/dL Final   Globulin, Total 02/22/2024 2.9  1.5 - 4.5 g/dL Final   Bilirubin Total 02/22/2024 <0.2  0.0 - 1.2 mg/dL Final   Alkaline Phosphatase 02/22/2024 220 (H)  44 - 121 IU/L Final   AST 02/22/2024 25  0 - 40 IU/L Final   ALT  02/22/2024 20  0 - 32 IU/L Final   WBC 03/15/2024 5.9  3.4 - 10.8 x10E3/uL Final   RBC 03/15/2024 3.91  3.77 - 5.28 x10E6/uL Final   Hemoglobin 03/15/2024 10.3 (L)  11.1 - 15.9 g/dL Final   Hematocrit 19/14/7829 33.4 (L)  34.0 - 46.6 % Final   MCV 03/15/2024 85  79 - 97 fL Final   MCH 03/15/2024 26.3 (L)  26.6 - 33.0 pg Final   MCHC 03/15/2024 30.8 (L)  31.5 - 35.7 g/dL Final   RDW 56/21/3086 14.5  11.7 - 15.4 % Final   Platelets 03/15/2024 256  150 - 450 x10E3/uL Final   Neutrophils 03/15/2024 53  Not Estab. % Final   Lymphs 03/15/2024 36  Not Estab. % Final   Monocytes 03/15/2024 8  Not Estab. % Final   Eos 03/15/2024 2  Not Estab. % Final   Basos 03/15/2024 1  Not Estab. % Final   Neutrophils Absolute 03/15/2024 3.1  1.4 - 7.0 x10E3/uL Final   Lymphocytes Absolute 03/15/2024 2.1  0.7 - 3.1 x10E3/uL Final   Monocytes Absolute 03/15/2024 0.5  0.1 - 0.9 x10E3/uL Final   EOS (ABSOLUTE) 03/15/2024 0.1  0.0 - 0.4 x10E3/uL Final   Basophils Absolute 03/15/2024 0.0  0.0 - 0.2 x10E3/uL Final   Immature Granulocytes 03/15/2024 0  Not Estab. % Final   Immature Grans (Abs) 03/15/2024 0.0  0.0 - 0.1 x10E3/uL Final    ECG: Date: 03/20/2024  Time ECG obtained: 0806 AM Rate: 52 bpm Rhythm: sinus bradycardia Axis (leads I and aVF): normal Intervals: PR 154 ms. QRS 94 ms. QTc 414 ms. ST segment and T wave changes: No evidence of acute T wave abnormalities or significant ST segment elevation or depression.  Evidence of a possible, age undetermined, prior infarct:  No Comparison: Similar to previous tracing obtained on  12/21/2023   IMAGING / PROCEDURES: CT ABDOMEN PELVIS WO CONTRAST performed on 01/10/2024 11.1 x 1.5 x 4.4 cm ill-defined fluid collection in the deep subcutaneous fat of the lower right anterior abdominal wall, just superficial to the rectus fascia. There is an associated midline component in the suprapubic region that measures on the order of 1.7 x 3.0 x 2.9 cm. These collections likely communicate with each other. Findings could reflect a postoperative seroma. Abscess would also be a consideration although assessment is limited by lack of intravenous contrast material. Stranding in the surrounding subcutaneous fat raises the question of cellulitis. Fluid-filled mildly distended small bowel in the pelvis measuring up to 2.9 cm diameter. Assessment for wall thickening is not possible due to lack of oral and intravenous contrast material. No substantial perienteric edema. Imaging features are nonspecific and may be related to ileus. Bilateral nonobstructing renal stones.  TRANSTHORACIC ECHOCARDIOGRAM performed on 09/30/2023 Left ventricular ejection fraction, by estimation, is 55 to 60%. The left ventricle has normal function. The left ventricle has no regional wall motion abnormalities. Left ventricular diastolic parameters are consistent with Grade I diastolic dysfunction (impaired relaxation).  Right ventricular systolic function is normal. The right ventricular size is normal. There is normal pulmonary artery systolic pressure. The estimated right ventricular systolic pressure is 18.4 mmHg.  The mitral valve is normal in structure. No evidence of mitral valve regurgitation. No evidence of mitral stenosis.  The aortic valve is tricuspid. Aortic valve regurgitation is not visualized. Aortic valve sclerosis is present, with no evidence of aortic valve stenosis.  The inferior vena cava is normal in size with greater than 50% respiratory variability,  suggesting right atrial pressure of 3 mmHg.      IMPRESSION AND PLAN: Avelynn Kempf has been referred for pre-anesthesia review and clearance prior to her undergoing the planned anesthetic and procedural courses. Available labs, pertinent testing, and imaging results were personally reviewed by me in preparation for upcoming operative/procedural course. [facility] medical record has been updated following extensive record review and patient interview with PAT staff.   This patient has been appropriately cleared by cardiology with an overall ACCEPTABLE risk of patient experiencing significant perioperative cardiovascular complications. Based on clinical review performed today (03/24/24), barring any significant acute changes in the patient's overall condition, it is anticipated that she will be able to proceed with the planned surgical intervention. Any acute changes in clinical condition may necessitate her procedure being postponed and/or cancelled. Patient will meet with anesthesia team (MD and/or CRNA) on the day of her procedure for preoperative evaluation/assessment. Questions regarding anesthetic course will be fielded at that time.   Pre-surgical instructions were reviewed with the patient during his PAT appointment, and questions were fielded to satisfaction by PAT clinical staff. She has been instructed on which medications that she will need to hold prior to surgery, as well as the ones that have been deemed safe/appropriate to take on the day of her procedure. As part of the general education provided by PAT, patient made aware both verbally and in writing, that she would need to abstain from the use of any illegal substances during her perioperative course. She was advised that failure to follow the provided instructions could necessitate case cancellation or result in serious perioperative complications up to and including death. Patient encouraged to contact PAT and/or her surgeon's office to discuss any questions or concerns that  may arise prior to surgery; verbalized understanding.   Sarah Caroline, MSN, APRN, FNP-C, CEN [facility]  Perioperative Services Nurse Practitioner Phone: [phone] Fax: [phone] 03/24/24 11:31 AM  NOTE: This note has been prepared using Dragon dictation software. Despite my best ability to proofread, there is always the potential that unintentional transcriptional errors may still occur from this process.

## 2024-03-28 ENCOUNTER — Ambulatory Visit: Admitting: Family

## 2024-03-28 ENCOUNTER — Encounter: Payer: Self-pay | Admitting: Family

## 2024-03-28 VITALS — BP 130/80 | HR 85 | Temp 98.0°F | Ht 66.5 in | Wt 175.6 lb

## 2024-03-28 DIAGNOSIS — R899 Unspecified abnormal finding in specimens from other organs, systems and tissues: Secondary | ICD-10-CM | POA: Diagnosis not present

## 2024-03-28 DIAGNOSIS — M2550 Pain in unspecified joint: Secondary | ICD-10-CM | POA: Diagnosis not present

## 2024-03-28 DIAGNOSIS — D649 Anemia, unspecified: Secondary | ICD-10-CM | POA: Diagnosis not present

## 2024-03-28 DIAGNOSIS — K629 Disease of anus and rectum, unspecified: Secondary | ICD-10-CM | POA: Insufficient documentation

## 2024-03-28 MED ORDER — GABAPENTIN 300 MG PO CAPS
300.0000 mg | ORAL_CAPSULE | ORAL | Status: AC
  Administered 2024-03-29: 300 mg via ORAL

## 2024-03-28 MED ORDER — CEFAZOLIN SODIUM-DEXTROSE 2-4 GM/100ML-% IV SOLN
2.0000 g | INTRAVENOUS | Status: AC
  Administered 2024-03-29: 2 g via INTRAVENOUS

## 2024-03-28 MED ORDER — GABAPENTIN 100 MG PO CAPS: 100.0000 mg | ORAL_CAPSULE | Freq: Three times a day (TID) | ORAL | 3 refills | Status: DC

## 2024-03-28 MED ORDER — ACETAMINOPHEN 500 MG PO TABS
1000.0000 mg | ORAL_TABLET | ORAL | Status: AC
  Administered 2024-03-29: 1000 mg via ORAL

## 2024-03-28 MED ORDER — ORAL CARE MOUTH RINSE: 15.0000 mL | Freq: Once | OROMUCOSAL | Status: AC

## 2024-03-28 MED ORDER — CHLORHEXIDINE GLUCONATE CLOTH 2 % EX PADS: 6.0000 | MEDICATED_PAD | Freq: Once | CUTANEOUS | Status: DC

## 2024-03-28 MED ORDER — BUPIVACAINE LIPOSOME 1.3 % IJ SUSP: 20.0000 mL | Freq: Once | INTRAMUSCULAR | Status: DC

## 2024-03-28 MED ORDER — LACTATED RINGERS IV SOLN: INTRAVENOUS | Status: DC

## 2024-03-28 MED ORDER — CHLORHEXIDINE GLUCONATE 0.12 % MT SOLN
15.0000 mL | Freq: Once | OROMUCOSAL | Status: AC
  Administered 2024-03-29: 15 mL via OROMUCOSAL

## 2024-03-28 NOTE — Assessment & Plan Note (Signed)
 Pending anal pap.

## 2024-03-28 NOTE — Patient Instructions (Signed)
 Start gabapentin  and titrate.

## 2024-03-28 NOTE — Assessment & Plan Note (Signed)
 Normocytic anemia. Question if r/t bariatric surgery and complicated by abdominal wounds/ exploratory surgery with Dr Ofilia Benton. Colonoscopy and EGD 10/25/23  Dr Mardene Shake [facility] ( requesting formal records).  If worsens, will refer back to GI for capsule study. Pending labs.

## 2024-03-28 NOTE — Assessment & Plan Note (Addendum)
 Diffuse. Presentation not consistent RA, gout. Etiology unclear. She is unable to take NSAIDs d/t bariatric surgery nor acetaminophen  d/t PBC.  Pending labs.  Icing, compression stockings. Start gabapentin  100mg  TID and titrate.  She will continue working out with personal trainer  Close follow up.

## 2024-03-28 NOTE — Progress Notes (Signed)
 Assessment & Plan:   Arthralgia, unspecified joint Assessment & Plan: Diffuse. Presentation not consistent RA, gout. Etiology unclear. She is unable to take NSAIDs d/t bariatric surgery nor acetaminophen  d/t PBC.  Pending labs.  Icing, compression stockings. Start gabapentin  100mg  TID and titrate.  She will continue working out with personal trainer  Close follow up.   Orders: -     Gabapentin ; Take 1 capsule (100 mg total) by mouth 3 (three) times daily.  Dispense: 90 capsule; Refill: 3  Abnormal laboratory test -     Iron, TIBC and Ferritin Panel -     Microalbumin / creatinine urine ratio -     Hemoglobin A1c -     Urinalysis, Routine w reflex microscopic -     B12 and Folate Panel -     ANA -     C-reactive protein -     CYCLIC CITRUL PEPTIDE ANTIBODY, IGG/IGA -     Sedimentation rate -     Uric acid  Rectal lesion -     HPV Aptima -     IGP, Aptima HPV  Anemia, unspecified type Assessment & Plan: Normocytic anemia. Question if r/t bariatric surgery and complicated by abdominal wounds/ exploratory surgery with Dr Ofilia Benton. Colonoscopy and EGD 10/25/23  Dr Mard[facility] ( requesting formal records).  If worsens, will refer back to GI for capsule study. Pending labs.    Anal lesion Assessment & Plan: Pending anal pap.       Return precautions given.   Risks, benefits, and alternatives of the medications and treatment plan prescribed today were discussed, and patient expressed understanding.   Education regarding symptom management and diagnosis given to patient on AVS either electronically or printed.  No follow-ups on file.  Bascom Bossier, FNP  Subjective:    Patient ID: [name], female    DOB: [birth year], 58 y.o.   MRN: [id number]  CC: [name] is a 58 y.o. female who presents today for follow up.   HPI: She has felt skin lesions in rectum and concerned for HPV. Denies anal intercourse  H/o cervical HPV     She is  compliant with bariatric vitamin. Denies rectal bleeding.    Complains bl hand pain, elbows, knee pain, bottoms of feet x 9month. Walking is painful. Sitting for prolonged periods of time exacerbates and she feels stiff when she goes to stand.  Under her feet she describes as burning.   No joint swelling, fever, chills.   Colonoscopy and EGD 10/25/23  Dr Shelda Destine [facility]  Colonoscopy 09/11/2019.  No polyps.  Repeat in 5 years Dr Antony [facility]umgart[facility] tomorrow Dr Ofilia Benton   bariatric surgery in 2017  Allergies: Ivp dye [iodinated contrast media], Latex, Tolmetin, Gallium nitrate, Insulin  detemir, Nsaids, and Gadolinium derivatives Current Outpatient Medications on File Prior to Visit  Medication Sig Dispense Refill   amLODipine  (NORVASC ) 5 MG tablet Take 1 tablet (5 mg total) by mouth as directed. Take 1 whole tablet (5 mg) in the morning AND Take half a tablet (2.5 mg) at bedtime. 135 tablet 3   ursodiol  (ACTIGALL ) 250 MG tablet Take 500-750 mg by mouth See admin instructions. 750 mg in the morning, 500 mg at bedtime     apixaban  (ELIQUIS ) 5 MG TABS tablet Take 1 tablet (5 mg total) by mouth 2 (two) times daily. (Patient not taking: Reported on 03/28/2024) 60 tablet 11   Multiple Vitamins-Minerals (BARIATRIC MULTIVITAMINS/IRON PO) Take 1 tablet by mouth daily. (Patient  not taking: Reported on 03/28/2024)     No current facility-administered medications on file prior to visit.    Review of Systems  Constitutional:  Negative for chills and fever.  Respiratory:  Negative for cough.   Cardiovascular:  Negative for chest pain and palpitations.  Gastrointestinal:  Negative for blood in stool, nausea, rectal pain and vomiting.  Musculoskeletal:  Positive for arthralgias.  Neurological:  Positive for numbness.      Objective:    BP 130/80   Pulse 85   Temp 98 F (36.7 C) (Oral)   Ht 5' 6.5" (1.689 m)   Wt 175 lb 9.6 oz (79.7 kg)   LMP 07/11/2016 (Exact Date)   SpO2 99%   BMI 27.92 kg/m  BP  Readings from Last 3 Encounters:  03/28/24 130/80  03/20/24 (!) 158/74  03/16/24 123/78   Wt Readings from Last 3 Encounters:  03/28/24 175 lb 9.6 oz (79.7 kg)  03/23/24 170 lb (77.1 kg)  03/20/24 171 lb (77.6 kg)    Physical Exam Vitals reviewed.  Constitutional:      Appearance: She is well-developed.  Eyes:     Conjunctiva/sclera: Conjunctivae normal.  Cardiovascular:     Rate and Rhythm: Normal rate and regular rhythm.     Pulses: Normal pulses.     Heart sounds: Normal heart sounds.     Comments: Nonpitting trace edema BLE.   Pulmonary:     Effort: Pulmonary effort is normal.     Breath sounds: Normal breath sounds. No wheezing, rhonchi or rales.  Genitourinary:      Comments: Pedunculated skin colored lesions approx 7 oclock and 4 oclock. Rectal pap obtained.  Musculoskeletal:     Right elbow: No swelling.     Left elbow: No swelling.     Right forearm: No swelling, edema or tenderness.     Left forearm: No swelling, edema or tenderness.     Right wrist: No swelling. Normal range of motion.     Left wrist: No swelling. Normal range of motion.     Right knee: No swelling or bony tenderness. Normal range of motion. No tenderness.     Left knee: No swelling or bony tenderness. Normal range of motion. No tenderness.     Comments: Bilateral knees are symmetric. No effusion appreciated. No increase in warmth or erythema. Crepitus felt with flexion of bilateral knees.  Bilateral knees:  Able to extend to -5 to 10 degrees and flex to 110 degrees. No catching with McMurray maneuver. No patellar apprehension. Negative anterior drawer and lachman's- no laxity appreciated.  No calf tenderness of lower leg edema bilaterally.   Skin:    General: Skin is warm and dry.  Neurological:     Mental Status: She is alert.  Psychiatric:        Speech: Speech normal.        Behavior: Behavior normal.        Thought Content: Thought content normal.

## 2024-03-29 ENCOUNTER — Ambulatory Visit
Admission: RE | Admit: 2024-03-29 | Discharge: 2024-03-29 | Disposition: A | Discharge status: Home / Self Care | Attending: Surgery | Admitting: Surgery

## 2024-03-29 ENCOUNTER — Ambulatory Visit: Payer: Self-pay | Admitting: Urgent Care

## 2024-03-29 ENCOUNTER — Other Ambulatory Visit: Payer: Self-pay

## 2024-03-29 ENCOUNTER — Encounter
Admission: RE | Disposition: A | Discharge status: Home / Self Care | Payer: Self-pay | Source: Home / Self Care | Attending: Surgery

## 2024-03-29 ENCOUNTER — Encounter: Payer: Self-pay | Admitting: Surgery

## 2024-03-29 DIAGNOSIS — K743 Primary biliary cirrhosis: Secondary | ICD-10-CM | POA: Diagnosis not present

## 2024-03-29 DIAGNOSIS — Z7901 Long term (current) use of anticoagulants: Secondary | ICD-10-CM | POA: Insufficient documentation

## 2024-03-29 DIAGNOSIS — S301XXD Contusion of abdominal wall, subsequent encounter: Secondary | ICD-10-CM | POA: Diagnosis not present

## 2024-03-29 DIAGNOSIS — J45909 Unspecified asthma, uncomplicated: Secondary | ICD-10-CM | POA: Insufficient documentation

## 2024-03-29 DIAGNOSIS — L7634 Postprocedural seroma of skin and subcutaneous tissue following other procedure: Secondary | ICD-10-CM | POA: Insufficient documentation

## 2024-03-29 DIAGNOSIS — I10 Essential (primary) hypertension: Secondary | ICD-10-CM | POA: Insufficient documentation

## 2024-03-29 DIAGNOSIS — Z8249 Family history of ischemic heart disease and other diseases of the circulatory system: Secondary | ICD-10-CM | POA: Insufficient documentation

## 2024-03-29 DIAGNOSIS — G4733 Obstructive sleep apnea (adult) (pediatric): Secondary | ICD-10-CM | POA: Diagnosis not present

## 2024-03-29 DIAGNOSIS — T182XXA Foreign body in stomach, initial encounter: Secondary | ICD-10-CM | POA: Diagnosis not present

## 2024-03-29 DIAGNOSIS — E119 Type 2 diabetes mellitus without complications: Secondary | ICD-10-CM | POA: Insufficient documentation

## 2024-03-29 DIAGNOSIS — I480 Paroxysmal atrial fibrillation: Secondary | ICD-10-CM | POA: Diagnosis not present

## 2024-03-29 HISTORY — PX: LAPAROTOMY: SHX154

## 2024-03-29 HISTORY — DX: Gastro-esophageal reflux disease without esophagitis: K21.9

## 2024-03-29 SURGERY — LAPAROTOMY, EXPLORATORY
Anesthesia: General | Site: Abdomen

## 2024-03-29 MED ORDER — DROPERIDOL 2.5 MG/ML IJ SOLN: 0.6250 mg | Freq: Once | INTRAMUSCULAR | Status: DC | PRN

## 2024-03-29 MED ORDER — ROCURONIUM BROMIDE 100 MG/10ML IV SOLN
INTRAVENOUS | Status: DC | PRN
  Administered 2024-03-29: 40 mg via INTRAVENOUS

## 2024-03-29 MED ORDER — BUPIVACAINE-EPINEPHRINE (PF) 0.25% -1:200000 IJ SOLN
INTRAMUSCULAR | Status: AC
Start: 2024-03-29 — End: ?
  Filled 2024-03-29: qty 30

## 2024-03-29 MED ORDER — OXYCODONE HCL 5 MG/5ML PO SOLN: 5.0000 mg | Freq: Once | ORAL | Status: DC | PRN

## 2024-03-29 MED ORDER — FENTANYL CITRATE (PF) 100 MCG/2ML IJ SOLN
INTRAMUSCULAR | Status: DC | PRN
  Administered 2024-03-29 (×2): 50 ug via INTRAVENOUS

## 2024-03-29 MED ORDER — HYDROCODONE-ACETAMINOPHEN 5-325 MG PO TABS: 1.0000 | ORAL_TABLET | Freq: Four times a day (QID) | ORAL | 0 refills | Status: DC | PRN

## 2024-03-29 MED ORDER — LIDOCAINE HCL (CARDIAC) PF 100 MG/5ML IV SOSY
PREFILLED_SYRINGE | INTRAVENOUS | Status: DC | PRN
  Administered 2024-03-29: 100 mg via INTRAVENOUS

## 2024-03-29 MED ORDER — OXYCODONE HCL 5 MG PO TABS: 5.0000 mg | ORAL_TABLET | Freq: Once | ORAL | Status: DC | PRN

## 2024-03-29 MED ORDER — ROCURONIUM BROMIDE 10 MG/ML (PF) SYRINGE
PREFILLED_SYRINGE | INTRAVENOUS | Status: AC
  Filled 2024-03-29: qty 10

## 2024-03-29 MED ORDER — MIDAZOLAM HCL 2 MG/2ML IJ SOLN
INTRAMUSCULAR | Status: AC
  Filled 2024-03-29: qty 2

## 2024-03-29 MED ORDER — MIDAZOLAM HCL 2 MG/2ML IJ SOLN
INTRAMUSCULAR | Status: DC | PRN
  Administered 2024-03-29: 2 mg via INTRAVENOUS

## 2024-03-29 MED ORDER — SUGAMMADEX SODIUM 200 MG/2ML IV SOLN
INTRAVENOUS | Status: DC | PRN
  Administered 2024-03-29: 200 mg via INTRAVENOUS

## 2024-03-29 MED ORDER — GABAPENTIN 300 MG PO CAPS
ORAL_CAPSULE | ORAL | Status: AC
  Filled 2024-03-29: qty 1

## 2024-03-29 MED ORDER — ACETAMINOPHEN 10 MG/ML IV SOLN: 1000.0000 mg | Freq: Once | INTRAVENOUS | Status: DC | PRN

## 2024-03-29 MED ORDER — FENTANYL CITRATE (PF) 100 MCG/2ML IJ SOLN: 25.0000 ug | INTRAMUSCULAR | Status: DC | PRN

## 2024-03-29 MED ORDER — CEFAZOLIN SODIUM-DEXTROSE 2-4 GM/100ML-% IV SOLN
INTRAVENOUS | Status: AC
  Filled 2024-03-29: qty 100

## 2024-03-29 MED ORDER — FENTANYL CITRATE (PF) 100 MCG/2ML IJ SOLN
INTRAMUSCULAR | Status: AC
  Filled 2024-03-29: qty 2

## 2024-03-29 MED ORDER — VASHE WOUND IRRIGATION OPTIME
TOPICAL | Status: DC | PRN
  Administered 2024-03-29: 34 [oz_av]

## 2024-03-29 MED ORDER — CHLORHEXIDINE GLUCONATE 0.12 % MT SOLN
OROMUCOSAL | Status: AC
  Filled 2024-03-29: qty 15

## 2024-03-29 MED ORDER — EPHEDRINE 5 MG/ML INJ
INTRAVENOUS | Status: AC
  Filled 2024-03-29: qty 5

## 2024-03-29 MED ORDER — ONDANSETRON HCL 4 MG/2ML IJ SOLN
INTRAMUSCULAR | Status: DC | PRN
Start: 2024-03-29 — End: 2024-03-29
  Administered 2024-03-29: 4 mg via INTRAVENOUS

## 2024-03-29 MED ORDER — KETOROLAC TROMETHAMINE 30 MG/ML IJ SOLN
INTRAMUSCULAR | Status: DC | PRN
  Administered 2024-03-29: 30 mg via INTRAVENOUS

## 2024-03-29 MED ORDER — DEXAMETHASONE SODIUM PHOSPHATE 10 MG/ML IJ SOLN
INTRAMUSCULAR | Status: DC | PRN
  Administered 2024-03-29: 10 mg via INTRAVENOUS

## 2024-03-29 MED ORDER — ACETAMINOPHEN 500 MG PO TABS
ORAL_TABLET | ORAL | Status: AC
  Filled 2024-03-29: qty 2

## 2024-03-29 MED ORDER — EPHEDRINE SULFATE-NACL 50-0.9 MG/10ML-% IV SOSY
PREFILLED_SYRINGE | INTRAVENOUS | Status: DC | PRN
  Administered 2024-03-29 (×3): 10 mg via INTRAVENOUS

## 2024-03-29 MED ORDER — BUPIVACAINE-EPINEPHRINE (PF) 0.25% -1:200000 IJ SOLN
INTRAMUSCULAR | Status: DC | PRN
  Administered 2024-03-29: 30 mL

## 2024-03-29 MED ORDER — PROPOFOL 10 MG/ML IV BOLUS
INTRAVENOUS | Status: DC | PRN
  Administered 2024-03-29: 50 ug/kg/min via INTRAVENOUS
  Administered 2024-03-29: 150 mg via INTRAVENOUS

## 2024-03-29 SURGICAL SUPPLY — 35 items
BLADE CLIPPER SURG (BLADE) IMPLANT
CHLORAPREP W/TINT 26 (MISCELLANEOUS) ×1 IMPLANT
CLIP APPLIE 11 MED OPEN (CLIP) IMPLANT
CLIP APPLIE 13 LRG OPEN (CLIP) IMPLANT
DERMABOND ADVANCED .7 DNX6 (GAUZE/BANDAGES/DRESSINGS) IMPLANT
DRAIN CHANNEL JP 15F RND 3/16 (MISCELLANEOUS) IMPLANT
DRAPE C-SECTION (MISCELLANEOUS) ×1 IMPLANT
DRAPE LAPAROTOMY 100X77 ABD (DRAPES) IMPLANT
DRAPE TABLE BACK 80X90 (DRAPES) ×1 IMPLANT
ELECT BLADE 6.5 EXT (BLADE) ×1 IMPLANT
ELECTRODE REM PT RTRN 9FT ADLT (ELECTROSURGICAL) ×1 IMPLANT
EVACUATOR SILICONE 100CC (DRAIN) IMPLANT
GAUZE 4X4 16PLY ~~LOC~~+RFID DBL (SPONGE) IMPLANT
GLOVE ORTHO TXT STRL SZ7.5 (GLOVE) ×2 IMPLANT
GOWN STRL REUS W/ TWL LRG LVL3 (GOWN DISPOSABLE) ×2 IMPLANT
GOWN STRL REUS W/ TWL XL LVL3 (GOWN DISPOSABLE) ×2 IMPLANT
HANDLE YANKAUER SUCT BULB TIP (MISCELLANEOUS) IMPLANT
LIGASURE IMPACT 36 18CM CVD LR (INSTRUMENTS) IMPLANT
MANIFOLD NEPTUNE II (INSTRUMENTS) ×1 IMPLANT
NS IRRIG 1000ML POUR BTL (IV SOLUTION) ×1 IMPLANT
PACK BASIN MAJOR ARMC (MISCELLANEOUS) ×1 IMPLANT
PACK COLON CLEAN CLOSURE (MISCELLANEOUS) IMPLANT
SPONGE T-LAP 18X18 ~~LOC~~+RFID (SPONGE) IMPLANT
STAPLER SKIN PROX 35W (STAPLE) ×1 IMPLANT
SUT PDS AB 1 CT 36 (SUTURE) ×3 IMPLANT
SUT SILK 3 0 SH CR/8 (SUTURE) ×1 IMPLANT
SUT VIC AB 2-0 SH 27XBRD (SUTURE) IMPLANT
SUT VIC AB 3-0 SH 27X BRD (SUTURE) IMPLANT
SUT VICRYL 2-0 54IN ABS (SUTURE) IMPLANT
SUTURE EHLN 3-0 FS-10 30 BLK (SUTURE) IMPLANT
SUTURE MNCRL 4-0 27XMF (SUTURE) IMPLANT
SYR BULB IRRIG 60ML STRL (SYRINGE) IMPLANT
TRAP FLUID SMOKE EVACUATOR (MISCELLANEOUS) ×1 IMPLANT
TRAY FOLEY MTR SLVR 16FR STAT (SET/KITS/TRAYS/PACK) ×1 IMPLANT
WATER STERILE IRR 500ML POUR (IV SOLUTION) ×1 IMPLANT

## 2024-03-29 NOTE — Transfer of Care (Signed)
 Immediate Anesthesia Transfer of Care Note  Patient: [name]  Procedure(s) Performed: LAPAROTOMY, EXPLORATORY (Abdomen)  Patient Location: PACU  Anesthesia Type:General  Level of Consciousness: drowsy  Airway & Oxygen Therapy: Patient Spontanous Breathing  Post-op Assessment: Report given to RN and Post -op Vital signs reviewed and stable  Post vital signs: Reviewed and stable  Last Vitals:  Vitals Value Taken Time  BP 151/80 03/29/24 0949  Temp    Pulse 88 03/29/24 0952  Resp 21 03/29/24 0952  SpO2 93 % 03/29/24 0952  Vitals shown include unfiled device data.  Last Pain:  Vitals:   03/29/24 0712  TempSrc: Temporal  PainSc: 0-No pain         Complications: There were no known notable events for this encounter.

## 2024-03-29 NOTE — Anesthesia Preprocedure Evaluation (Addendum)
 Anesthesia Evaluation  Patient identified by MRN, date of birth, ID band Patient awake    Reviewed: Allergy & Precautions, H&P , NPO status , Patient's Chart, lab work & pertinent test results  Airway Mallampati: II  TM Distance: >3 FB Neck ROM: full    Dental no notable dental hx.    Pulmonary asthma , sleep apnea    Pulmonary exam normal        Cardiovascular hypertension, + dysrhythmias Atrial Fibrillation   Diastolic dysfunction   Neuro/Psych negative neurological ROS  negative psych ROS   GI/Hepatic hiatal hernia,GERD  ,,(+) Hepatitis -Primary Biliary Cirrhosis hiatal hernia (s/p repair)   Endo/Other  diabetes, Type 2   multinodular goiter  Renal/GU      Musculoskeletal   Abdominal   Peds  Hematology  (+) Blood dyscrasia, anemia   Anesthesia Other Findings Past Medical History: No date: Asthma due to seasonal allergies No date: Bradycardia No date:  No date: Dysplasia of cervix, low grade (CIN 1) No date: GERD (gastroesophageal reflux disease) No date: Hiatal hernia     Comment:  a.) s/p repair concurrently with biliopancreatic               diversion with duodenal switch procedure in 2017 No date: History of diabetes mellitus, type II     Comment:  a.) resolved after bariatric surgery done in 2017 No date: History of hiatal hernia s/p repair No date: History of kidney stones No date: History of obstructive sleep apnea     Comment:  a.) resolved after bariatric surgery done in 2017; has               had repeat PSG testing that was negative since surgery No date: HTN (hypertension) No date: Hyperlipidemia No date: Iron deficiency anemia No date: MRSA carrier No date: Multinodular goiter No date: NAFL (nonalcoholic fatty liver) No date: Nephrolithiasis 09/29/2023: New onset atrial fibrillation (HCC)     Comment:  a.) Dx'd 09/2023; b.) CHA2DS2VASc = 3 (sex, HTN,  T2DM)               as of  01/24/2024; b.) rate/rhythm maintained               intrinsically without need for pharmacological               intervention; chronically anticoagulated with apixaban  No date: On apixaban  therapy No date: Osteoporosis No date: Primary biliary cirrhosis (HCC) No date: Skin cancer of nose 08/03/2016: Status post biliopancreatic diversion with duodenal switch No date: Wears glasses  Past Surgical History: 01/26/2024: APPLICATION OF WOUND VAC; N/A     Comment:  Procedure: APPLICATION, WOUND VAC;  Surgeon: Flynn Hylan, MD;  Location: [facility];  Service: General;                Laterality: N/A; 07/2021: AUGMENTATION MAMMAPLASTY; Bilateral     Comment:  Implants and lift 07/05/2022: AUGMENTATION MAMMAPLASTY; Bilateral     Comment:  Dr Frosty Jews, replaced implants 08/03/2016: BARIATRIC SURGERY     Comment:  LAPAROSCOPY DUODENAL SWITCH AND HIATAL HERNIA REPAIR;               Dr. Therese Flash @[facility] , [place] No date: BRACHIOPLASTY; Bilateral 2017: BREAST BIOPSY; Right     Comment:  FIBROCYSTIC CHANGES WITH CALCIFICATIONS No date: CESAREAN SECTION     Comment:  x 2 09/11/2019: COLONOSCOPY WITH PROPOFOL ; N/A  Comment:  Procedure: COLONOSCOPY WITH PROPOFOL ;  Surgeon: Luke Salaam, MD;  Location: [facility] ENDOSCOPY;  Service:               Gastroenterology;  Laterality: N/A; 02/06/2022: CYSTOSCOPY WITH RETROGRADE PYELOGRAM, URETEROSCOPY AND  STENT PLACEMENT; Right 09/10/2017: CYSTOSCOPY WITH STENT PLACEMENT; Right     Comment:  Procedure: CYSTOSCOPY WITH STENT PLACEMENT;  Surgeon:               Geraline Knapp, MD;  Location: [facility];  Service:               Urology;  Laterality: Right; 04/24/2019: CYSTOSCOPY/URETEROSCOPY/HOLMIUM LASER/STENT PLACEMENT;  Right     Comment:  Procedure: CYSTOSCOPY/URETEROSCOPY/STENT PLACEMENT/               RIGHT RETROGRADE;  Surgeon: Christina Coyer, MD;                Location: [facility];  Service: Urology;                Laterality: Right; 10/28/2017: DX LAPAROSCOPY/  EGD/  HIATAL HERNIA REPAIR CLOSURE OF  INTERNAL MENSENTRY DEFECT     Comment:  @[email or web address] , [place] 01/30/2019: EXTRACORPOREAL SHOCK WAVE LITHOTRIPSY; N/A     Comment:  Procedure: EXTRACORPOREAL SHOCK WAVE LITHOTRIPSY (ESWL)               LEFT/ POSSIBLE RIGHT;  Surgeon: Christina Coyer, MD;                Location: [facility];  Service: Urology;  Laterality: N/A; 03/20/2019: EXTRACORPOREAL SHOCK WAVE LITHOTRIPSY; Right     Comment:  Procedure: EXTRACORPOREAL SHOCK WAVE LITHOTRIPSY (ESWL);              Surgeon: Trent Frizzle, MD;  Location: [facility];                Service: Urology;  Laterality: Right; 10/30/2021: EXTRACORPOREAL SHOCK WAVE LITHOTRIPSY; Left     Comment:  Procedure: EXTRACORPOREAL SHOCK WAVE LITHOTRIPSY (ESWL);              Surgeon: Andrez Banker, MD;  Location: [facility]  Service: Urology;  Laterality: Left; 10/15/2016: LAPAROSCOPIC CHOLECYSTECTOMY     Comment:  @[email or web address]  , [place] 01/26/2024: LAPAROTOMY; N/A     Comment:  Procedure: LAPAROTOMY, EXPLORATORY of abdominal wall               wounds, for removal of foreign body; ultrasound-guided               aspiratio of seroma;  Surgeon: Flynn Hylan, MD;                Location: [facility];  Service: General;  Laterality: N/A; No date: LIPOSUCTION No date: PANNICULECTOMY     Comment:  2022 child: TONSILLECTOMY AND ADENOIDECTOMY 09/10/2017: URETEROSCOPY WITH HOLMIUM LASER LITHOTRIPSY; Right     Comment:  Procedure: URETEROSCOPY WITH HOLMIUM LASER LITHOTRIPSY;               Surgeon: Geraline Knapp, MD;  Location: [facility];                Service: Urology;  Laterality: Right;  BMI    Body Mass Index: 27.82 kg/m      Reproductive/Obstetrics negative OB ROS  Anesthesia Physical Anesthesia Plan  ASA: 3  Anesthesia Plan: General ETT   Post-op Pain Management: Minimal or no pain  anticipated   Induction: Intravenous  PONV Risk Score and Plan: 2 and Ondansetron , Dexamethasone  and Midazolam   Airway Management Planned: Oral ETT  Additional Equipment:   Intra-op Plan:   Post-operative Plan: Extubation in OR  Informed Consent: I have reviewed the patients History and Physical, chart, labs and discussed the procedure including the risks, benefits and alternatives for the proposed anesthesia with the patient or authorized representative who has indicated his/her understanding and acceptance.     Dental Advisory Given  Plan Discussed with: CRNA and Surgeon  Anesthesia Plan Comments:        Anesthesia Quick Evaluation

## 2024-03-29 NOTE — Anesthesia Postprocedure Evaluation (Signed)
 Anesthesia Post Note  Patient: Karenna Romanoff  Procedure(s) Performed: LAPAROTOMY, EXPLORATORY (Abdomen)  Patient location during evaluation: PACU Anesthesia Type: General Level of consciousness: awake and alert Pain management: pain level controlled Vital Signs Assessment: post-procedure vital signs reviewed and stable Respiratory status: spontaneous breathing, nonlabored ventilation and respiratory function stable Cardiovascular status: blood pressure returned to baseline and stable Postop Assessment: no apparent nausea or vomiting Anesthetic complications: no   There were no known notable events for this encounter.   Last Vitals:  Vitals:   03/29/24 1130 03/29/24 1211  BP: 124/80 (!) 153/85  Pulse: 68 77  Resp: 17 16  Temp: 36.8 C 36.6 C  SpO2: 94% 97%    Last Pain:  Vitals:   03/29/24 1211  TempSrc: Temporal  PainSc: 0-No pain                 Baltazar Bonier

## 2024-03-29 NOTE — Op Note (Signed)
 Incision and drainage of abdominal wall seroma, with removal of retained permanent suture from subfascial/intramuscular cavity.  Pre-operative Diagnosis: Chronic postoperative seroma, with recurring inflammatory changes.  Post-operative Diagnosis: same.    Surgeon: Flynn Hylan, M.D., FACS  Anesthesia: General  Findings: 8 x 3 cm subfascial cavity inferior to the transverse right lower quadrant scar from prior abdominoplasty, with a 7 cm length of retained Prolene suture in the subfascial/muscular tissue.  Estimated Blood Loss: 5 mL         Specimens: Blue suture material removed from gelatinous cavity.  No evidence of pus or malodor, swab culture obtained.          Complications: none              Procedure Details  The patient was seen again in the Holding Room. The benefits, complications, treatment options, and expected outcomes were discussed with the patient. The risks of bleeding, infection, recurrence of symptoms, failure to resolve symptoms, unanticipated injury, prosthetic placement, prosthetic infection, any of which could require further surgery were reviewed with the patient. The likelihood of improving the patient's symptoms with return to their baseline status is expected.  The patient and/or family concurred with the proposed plan, giving informed consent.  The patient was taken to Operating Room, identified and the procedure verified.    Prior to the induction of general anesthesia, antibiotic prophylaxis was administered. VTE prophylaxis was in place.  General anesthesia was then administered and tolerated well. After the induction, the patient was positioned in the supine position and the right lower quadrant lower abdominal wall was prepped with  Chloraprep and draped in the sterile fashion.  A Time Out was held and the above information confirmed.  Local infiltration of the scar with quarter percent Marcaine  with epinephrine  is completed prior to making incision.   Incision is made along the prior scar, and extended as needed to approach the deeper tissues.  It extended from midline for the right lower quadrant approximately 8 cm. I carried it down through the subcutaneous tissues following the naturally cleaving tissue planes.  I then identified a cavity under scar at the medial limit and by probing it and found access to the larger cavity.  I then opened the fascia covering this space to allow adequate drainage, a bone curette was utilized to ensure complete removal of all this gelatinous material.  I found a blue Prolene like suture within this space adherent and involving the midline, it was removed freely there were no knots, the suture was not cut during this procedure.  Inspection for further foreign material was completed without identifying any additional. A bone curette was utilized to ensure adequate cleansing of this cavity.  I then utilized Vashe to cleanse the abdominal cavity, just shy of a liter was utilized.  I placed a 15 Blake drain into the deep fascial space, withdrew it out of the right lower quadrant skin with trocar.  Additional local infiltration was infiltrated into the region for regional block and postoperative pain control.  The drain was secured with 3-0 nylon.  I then closed the fascia with a running 2-0 Vicryl.  Dermal tissues were then reapproximated with interrupted 3-0 Vicryl's.  A subcuticular of 4-0 Monocryl was placed and sealed with Dermabond.        Flynn Hylan M.D., [facility] 03/29/2024 10:02 AM

## 2024-03-29 NOTE — Anesthesia Procedure Notes (Signed)
 Procedure Name: Intubation Date/Time: 03/29/2024 8:41 AM  Performed by: Simona Dublin, CRNAPre-anesthesia Checklist: Patient identified, Emergency Drugs available, Suction available and Patient being monitored Patient Re-evaluated:Patient Re-evaluated prior to induction Oxygen Delivery Method: Circle system utilized Preoxygenation: Pre-oxygenation with 100% oxygen Induction Type: IV induction Ventilation: Mask ventilation without difficulty Laryngoscope Size: McGrath and 3 Grade View: Grade II Tube type: Oral Tube size: 6.5 mm Number of attempts: 1 Airway Equipment and Method: Stylet and Oral airway Placement Confirmation: ETT inserted through vocal cords under direct vision, positive ETCO2 and breath sounds checked- equal and bilateral Secured at: 20 cm Tube secured with: Tape Dental Injury: Teeth and Oropharynx as per pre-operative assessment

## 2024-03-29 NOTE — Interval H&P Note (Signed)
 History and Physical Interval Note:  03/29/2024 8:27 AM  [name]  has presented today for surgery, with the diagnosis of Abdominal wall seroma.  The various methods of treatment have been discussed with the patient and family. After consideration of risks, benefits and other options for treatment, the patient has consented to  Procedure(s) with comments: LAPAROTOMY, EXPLORATORY (N/A) - Abdominal wall, drain/resection seroma cavity as a surgical intervention.  The patient's history has been reviewed, patient examined, no change in status, stable for surgery.  I have reviewed the patient's chart and labs.  Questions were answered to the patient's satisfaction.   RLQ scar area is marked, after confirming presence of a heterogenous process in the extrafascial plane.   Flynn Hylan

## 2024-03-30 ENCOUNTER — Encounter: Payer: Self-pay | Admitting: Surgery

## 2024-03-30 ENCOUNTER — Encounter: Payer: Self-pay | Admitting: Family

## 2024-03-30 DIAGNOSIS — E559 Vitamin D deficiency, unspecified: Secondary | ICD-10-CM

## 2024-03-30 DIAGNOSIS — T148XXA Other injury of unspecified body region, initial encounter: Secondary | ICD-10-CM

## 2024-03-30 DIAGNOSIS — Z9884 Bariatric surgery status: Secondary | ICD-10-CM

## 2024-03-30 LAB — MICROSCOPIC EXAMINATION
Casts: NONE SEEN /LPF
RBC, Urine: NONE SEEN /HPF (ref 0–2)

## 2024-03-30 LAB — URINALYSIS, ROUTINE W REFLEX MICROSCOPIC
Bilirubin, UA: NEGATIVE
Glucose, UA: NEGATIVE
Ketones, UA: NEGATIVE
Nitrite, UA: POSITIVE — AB
RBC, UA: NEGATIVE
Specific Gravity, UA: 1.024 (ref 1.005–1.030)
Urobilinogen, Ur: 0.2 mg/dL (ref 0.2–1.0)
pH, UA: 5.5 (ref 5.0–7.5)

## 2024-03-30 LAB — B12 AND FOLATE PANEL
Folate: 5.6 ng/mL (ref >3.0)
Vitamin B-12: 1403 pg/mL — ABNORMAL HIGH (ref 232–1245)

## 2024-03-30 LAB — IRON,TIBC AND FERRITIN PANEL
Ferritin: 8 ng/mL — ABNORMAL LOW (ref 15–150)
Iron Saturation: 5 % — CL (ref 15–55)
Iron: 20 ug/dL — ABNORMAL LOW (ref 27–159)
Total Iron Binding Capacity: 389 ug/dL (ref 250–450)
UIBC: 369 ug/dL (ref 131–425)

## 2024-03-30 LAB — HEMOGLOBIN A1C
Est. average glucose Bld gHb Est-mCnc: 97 mg/dL
Hgb A1c MFr Bld: 5 % (ref 4.8–5.6)

## 2024-03-30 LAB — ANA: Anti Nuclear Antibody (ANA): NEGATIVE

## 2024-03-30 LAB — SEDIMENTATION RATE: Sed Rate: 37 mm/h (ref 0–40)

## 2024-03-30 LAB — CYCLIC CITRUL PEPTIDE ANTIBODY, IGG/IGA: Cyclic Citrullin Peptide Ab: 8 U (ref 0–19)

## 2024-03-30 LAB — MICROALBUMIN / CREATININE URINE RATIO
Creatinine, Urine: 94.2 mg/dL
Microalb/Creat Ratio: 15 mg/g{creat} (ref 0–29)
Microalbumin, Urine: 14 ug/mL

## 2024-03-30 LAB — URIC ACID: Uric Acid: 3.9 mg/dL (ref 3.0–7.2)

## 2024-03-30 LAB — C-REACTIVE PROTEIN: CRP: <1 mg/L (ref 0–10)

## 2024-03-31 ENCOUNTER — Encounter: Payer: Self-pay | Admitting: Family

## 2024-03-31 ENCOUNTER — Telehealth: Payer: Self-pay | Admitting: Family

## 2024-03-31 ENCOUNTER — Telehealth: Payer: Self-pay

## 2024-03-31 ENCOUNTER — Ambulatory Visit: Payer: Self-pay | Admitting: Family

## 2024-03-31 DIAGNOSIS — N3000 Acute cystitis without hematuria: Secondary | ICD-10-CM

## 2024-03-31 DIAGNOSIS — R899 Unspecified abnormal finding in specimens from other organs, systems and tissues: Secondary | ICD-10-CM

## 2024-03-31 DIAGNOSIS — D649 Anemia, unspecified: Secondary | ICD-10-CM

## 2024-03-31 LAB — SPECIMEN STATUS REPORT

## 2024-03-31 NOTE — Telephone Encounter (Signed)
 [facility] called inquiring about some labs that were sent in on the pt. When call was transferred to e2c2 they informed me that [facility] hung up. They stated they may call back.

## 2024-03-31 NOTE — Telephone Encounter (Signed)
 Attempts to contact your facility to clarify an ambiguous test order  were unsuccessful.  Please contact the laboratory to verify the  request. Specimens are routinely held for 7 days from date of receipt.  Ambiguous test order:  REQUEST: [id number] HPV APTIMA - APTIMA ORANGE SENT THIS IS NOT CORRECT SAMPLE  FOR TEST   I am getting very strange error codes for anal pap HPV.    Can you call  [facility]?   I am confused  Look for HPV of anus due to skin lesion present

## 2024-04-03 LAB — AEROBIC/ANAEROBIC CULTURE W GRAM STAIN (SURGICAL/DEEP WOUND): Gram Stain: NONE SEEN

## 2024-04-04 NOTE — Telephone Encounter (Signed)
 noted

## 2024-04-05 NOTE — Progress Notes (Unsigned)
 [facility] POST-OP OFFICE VISIT  04/06/2024  HPI: [name] is a 58 y.o. female had surgery on Mar 29, 2024, now s/p removal of retained permanent suture causing persisting seromatous cavity in the right lower quadrant scar area.  She was discharged with a JP drain, has minimal serous drainage remaining.  No evidence of persistent seroma, induration, inflammation or tenderness.  No c/o f/c, pain nor tenderness.   Vital signs: BP 137/88   Pulse 65   Temp 98.2 F (36.8 C) (Oral)   Ht 5' 6.5" (1.689 m)   Wt 173 lb 9.6 oz (78.7 kg)   LMP 07/11/2016 (Exact Date)   SpO2 98%   BMI 27.60 kg/m    Physical Exam: Constitutional: She appears well, quite pleased happy and mobile. Abdomen: Incision clean dry and intact.  There is no evidence of retained seroma Skin: No erythema nor induration.   Dermabond long gone, why?. Drain removed with only small serous fluid in tubing and bulb.  Dressing applied.   Assessment/Plan: This is a 58 y.o. female as above.    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   Paroxysmal atrial fibrillation with RVR (HCC) 09/29/2023   Open abdominal wall wound 09/29/2023   Hypokalemia 09/29/2023   Near syncope 09/29/2023   Left lower quadrant abdominal pain 06/03/2023   Wound dehiscence, surgical, sequela 05/10/2023   Poison ivy 05/03/2023   Overeating 02/22/2023   Abnormal brain MRI 02/22/2023   Tremor 01/11/2023   Abdominal hernia 09/18/2022   Abdominal distention 01/09/2022   Anemia 07/01/2021   Dysuria 07/01/2021   Dyspareunia, female 06/30/2021   Low back pain 12/09/2020   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   Headache 07/05/2020   Osteomalacia 12/27/2019   History of weight loss 11/28/2019   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    Localized osteoporosis without current pathological fracture 06/26/2019   Hypocalcemia 06/26/2019   Secondary hyperparathyroidism (HCC) 06/26/2019   Multinodular goiter 06/26/2019   Dysphagia 05/05/2019   Osteoporosis 05/03/2019   Memory loss 02/13/2019   Elevated liver function tests 10/13/2017   Epigastric abdominal pain 10/13/2017   History of kidney stones 10/13/2017   Hypertension 10/13/2017   Status post bariatric surgery 09/03/2017   Right ureteral calculus 08/31/2017   Hydronephrosis with urinary obstruction due to ureteral calculus 08/31/2017   Nephrolithiasis 08/31/2017   Screen for STD (sexually transmitted disease) 07/26/2017   Sleep apnea 10/06/2016   Candidal intertrigo 09/21/2016   H/O gastric bypass 09/21/2016   Hiatal hernia 04/21/2016   Elevated liver enzymes 09/04/2014   Vitamin D  deficiency 09/03/2014   Routine general medical examination at a health care facility 01/11/2014   T2DM (type 2 diabetes mellitus) (HCC) 01/11/2014    - Shower/bathing precautions reviewed.   Follow up as needed.    Flynn Hylan M.D., FACS 04/06/2024, 3:52 PM

## 2024-04-06 ENCOUNTER — Encounter: Payer: Self-pay | Admitting: Surgery

## 2024-04-06 ENCOUNTER — Ambulatory Visit (INDEPENDENT_AMBULATORY_CARE_PROVIDER_SITE_OTHER): Admitting: Surgery

## 2024-04-06 VITALS — BP 137/88 | HR 65 | Temp 98.2°F | Ht 66.5 in | Wt 173.6 lb

## 2024-04-06 DIAGNOSIS — S301XXD Contusion of abdominal wall, subsequent encounter: Secondary | ICD-10-CM

## 2024-04-06 NOTE — Patient Instructions (Signed)
Please call the office if you have any questions or concerns. 

## 2024-04-10 NOTE — Telephone Encounter (Signed)
 What is status of HPV lab? See message below

## 2024-04-11 LAB — IGP, APTIMA HPV: HPV Aptima: NEGATIVE

## 2024-04-12 ENCOUNTER — Ambulatory Visit: Payer: Self-pay | Admitting: Oncology

## 2024-04-12 ENCOUNTER — Inpatient Hospital Stay

## 2024-04-12 ENCOUNTER — Encounter: Payer: Self-pay | Admitting: Oncology

## 2024-04-12 ENCOUNTER — Ambulatory Visit: Payer: Self-pay | Admitting: Family

## 2024-04-12 ENCOUNTER — Inpatient Hospital Stay: Attending: Oncology | Admitting: Oncology

## 2024-04-12 VITALS — BP 134/83 | HR 85 | Temp 97.8°F | Resp 18 | Wt 172.5 lb

## 2024-04-12 DIAGNOSIS — Z803 Family history of malignant neoplasm of breast: Secondary | ICD-10-CM | POA: Insufficient documentation

## 2024-04-12 DIAGNOSIS — R5383 Other fatigue: Secondary | ICD-10-CM | POA: Insufficient documentation

## 2024-04-12 DIAGNOSIS — D509 Iron deficiency anemia, unspecified: Secondary | ICD-10-CM | POA: Diagnosis not present

## 2024-04-12 DIAGNOSIS — Z8249 Family history of ischemic heart disease and other diseases of the circulatory system: Secondary | ICD-10-CM | POA: Diagnosis not present

## 2024-04-12 DIAGNOSIS — Z79899 Other long term (current) drug therapy: Secondary | ICD-10-CM | POA: Insufficient documentation

## 2024-04-12 DIAGNOSIS — Z806 Family history of leukemia: Secondary | ICD-10-CM | POA: Insufficient documentation

## 2024-04-12 DIAGNOSIS — Z814 Family history of other substance abuse and dependence: Secondary | ICD-10-CM | POA: Diagnosis not present

## 2024-04-12 DIAGNOSIS — Z85828 Personal history of other malignant neoplasm of skin: Secondary | ICD-10-CM | POA: Diagnosis not present

## 2024-04-12 DIAGNOSIS — Z811 Family history of alcohol abuse and dependence: Secondary | ICD-10-CM | POA: Diagnosis not present

## 2024-04-12 DIAGNOSIS — Z807 Family history of other malignant neoplasms of lymphoid, hematopoietic and related tissues: Secondary | ICD-10-CM | POA: Diagnosis not present

## 2024-04-12 DIAGNOSIS — Z886 Allergy status to analgesic agent status: Secondary | ICD-10-CM | POA: Diagnosis not present

## 2024-04-12 DIAGNOSIS — I4891 Unspecified atrial fibrillation: Secondary | ICD-10-CM | POA: Insufficient documentation

## 2024-04-12 DIAGNOSIS — Z8349 Family history of other endocrine, nutritional and metabolic diseases: Secondary | ICD-10-CM | POA: Insufficient documentation

## 2024-04-12 DIAGNOSIS — Z83438 Family history of other disorder of lipoprotein metabolism and other lipidemia: Secondary | ICD-10-CM | POA: Diagnosis not present

## 2024-04-12 DIAGNOSIS — D508 Other iron deficiency anemias: Secondary | ICD-10-CM

## 2024-04-12 DIAGNOSIS — Z800 Family history of malignant neoplasm of digestive organs: Secondary | ICD-10-CM | POA: Diagnosis not present

## 2024-04-12 DIAGNOSIS — I10 Essential (primary) hypertension: Secondary | ICD-10-CM | POA: Insufficient documentation

## 2024-04-12 DIAGNOSIS — Z9089 Acquired absence of other organs: Secondary | ICD-10-CM | POA: Diagnosis not present

## 2024-04-12 DIAGNOSIS — Z9884 Bariatric surgery status: Secondary | ICD-10-CM | POA: Insufficient documentation

## 2024-04-12 DIAGNOSIS — Z818 Family history of other mental and behavioral disorders: Secondary | ICD-10-CM | POA: Insufficient documentation

## 2024-04-12 DIAGNOSIS — Z9049 Acquired absence of other specified parts of digestive tract: Secondary | ICD-10-CM | POA: Diagnosis not present

## 2024-04-12 DIAGNOSIS — Z7901 Long term (current) use of anticoagulants: Secondary | ICD-10-CM | POA: Insufficient documentation

## 2024-04-12 DIAGNOSIS — Z820 Family history of epilepsy and other diseases of the nervous system: Secondary | ICD-10-CM | POA: Diagnosis not present

## 2024-04-12 LAB — CBC WITH DIFFERENTIAL/PLATELET
Abs Immature Granulocytes: 0.01 10*3/uL (ref 0.00–0.07)
Basophils Absolute: 0 10*3/uL (ref 0.0–0.1)
Basophils Relative: 1 %
Eosinophils Absolute: 0.2 10*3/uL (ref 0.0–0.5)
Eosinophils Relative: 5 %
HCT: 34.8 % — ABNORMAL LOW (ref 36.0–46.0)
Hemoglobin: 10.4 g/dL — ABNORMAL LOW (ref 12.0–15.0)
Immature Granulocytes: 0 %
Lymphocytes Relative: 36 %
Lymphs Abs: 1.4 10*3/uL (ref 0.7–4.0)
MCH: 25.8 pg — ABNORMAL LOW (ref 26.0–34.0)
MCHC: 29.9 g/dL — ABNORMAL LOW (ref 30.0–36.0)
MCV: 86.4 fL (ref 80.0–100.0)
Monocytes Absolute: 0.4 10*3/uL (ref 0.1–1.0)
Monocytes Relative: 10 %
Neutro Abs: 1.9 10*3/uL (ref 1.7–7.7)
Neutrophils Relative %: 48 %
Platelets: 204 10*3/uL (ref 150–400)
RBC: 4.03 MIL/uL (ref 3.87–5.11)
RDW: 15 % (ref 11.5–15.5)
WBC: 3.9 10*3/uL — ABNORMAL LOW (ref 4.0–10.5)
nRBC: 0 % (ref 0.0–0.2)

## 2024-04-12 LAB — RETIC PANEL
Immature Retic Fract: 8.8 % (ref 2.3–15.9)
RBC.: 3.95 MIL/uL (ref 3.87–5.11)
Retic Count, Absolute: 39.1 10*3/uL (ref 19.0–186.0)
Retic Ct Pct: 1 % (ref 0.4–3.1)
Reticulocyte Hemoglobin: 29 pg (ref >27.9)

## 2024-04-12 LAB — IRON AND TIBC
Iron: 27 ug/dL — ABNORMAL LOW (ref 28–170)
Saturation Ratios: 5 % — ABNORMAL LOW (ref 10.4–31.8)
TIBC: 508 ug/dL — ABNORMAL HIGH (ref 250–450)
UIBC: 481 ug/dL

## 2024-04-12 LAB — FERRITIN: Ferritin: 5 ng/mL — ABNORMAL LOW (ref 11–307)

## 2024-04-12 NOTE — Addendum Note (Signed)
 Addended by: Timmy Forbes on: 04/12/2024 11:51 PM   Modules accepted: Orders

## 2024-04-12 NOTE — Assessment & Plan Note (Signed)
 Labs are reviewed and discussed with patient. Lab Results  Component Value Date   HGB 10.4 (L) 04/12/2024   TIBC 508 (H) 04/12/2024   IRONPCTSAT 5 (L) 04/12/2024   FERRITIN 5 (L) 04/12/2024    I discussed about option of continue oral iron supplementation and repeat blood work for evaluation of treatment response.  If no significant improvement, then proceed with IV Venofer treatments. Alternative option of proceed with IV Venofer treatments. I discussed about the potential risks including but not limited to allergic reactions/infusion reactions including anaphylactic reactions, diarrhea, phlebitis, high blood pressure, wheezing, SOB, skin rash, weight gain,dark urine, leg swelling, back pain, headache, nausea and fatigue, etc. Plan IV venofer weekly x 4 and she agrees with the plan .

## 2024-04-12 NOTE — Progress Notes (Signed)
 Hematology/Oncology Consult note [phone] [phone]      Patient Care Team: Calista Catching, FNP as PCP - General (Family Medicine) Jerelene Monday Deadra Everts, MD as PCP - Cardiology (Cardiology) Jerlean Mood, MD (General Surgery) Thersia Flax, MD (Internal Medicine) Timmy Forbes, MD as Consulting Physician (Hematology and Oncology)   REFERRING PROVIDER: Calista Catching, FNP  CHIEF COMPLAINTS/REASON FOR VISIT:  Iron deficiency Anemia  ASSESSMENT & PLAN:  Iron deficiency anemia Labs are reviewed and discussed with patient. Lab Results  Component Value Date   HGB 10.4 (L) 04/12/2024   TIBC 508 (H) 04/12/2024   IRONPCTSAT 5 (L) 04/12/2024   FERRITIN 5 (L) 04/12/2024    I discussed about option of continue oral iron supplementation and repeat blood work for evaluation of treatment response.  If no significant improvement, then proceed with IV Venofer treatments. Alternative option of proceed with IV Venofer treatments. I discussed about the potential risks including but not limited to allergic reactions/infusion reactions including anaphylactic reactions, diarrhea, phlebitis, high blood pressure, wheezing, SOB, skin rash, weight gain,dark urine, leg swelling, back pain, headache, nausea and fatigue, etc. Plan IV venofer weekly x 4 and she agrees with the plan .  Orders Placed This Encounter  Procedures   Ferritin    Standing Status:   Future    Number of Occurrences:   1    Expected Date:   04/12/2024    Expiration Date:   10/12/2024   Iron and TIBC    Standing Status:   Future    Number of Occurrences:   1    Expected Date:   04/12/2024    Expiration Date:   04/12/2025   CBC with Differential/Platelet    Standing Status:   Future    Number of Occurrences:   1    Expected Date:   04/12/2024    Expiration Date:   04/12/2025   Retic Panel    Standing Status:   Future    Number of Occurrences:   1    Expected Date:   04/12/2024    Expiration Date:    04/12/2025    All questions were answered. The patient knows to call the clinic with any problems, questions or concerns.  Timmy Forbes, MD, PhD [facility] Hematology Oncology 04/12/2024     HISTORY OF PRESENTING ILLNESS:  [name] is a  58 y.o.  female with PMH listed below who was referred to me for anemia Reviewed patient's recent labs that was done.  03/15/2024 cbc showed Hb 10.3, ferritin 8, iron saturation 5 Reviewed patient's previous labs ordered by primary care physician's office, anemia is chronic onset , duration is since 2020  She denies recent chest pain on exertion, pre-syncopal episodes, or palpitations + fatigue  She had not noticed any recent bleeding such as epistaxis, hematuria or hematochezia.  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 She is on Eliquis  since Nov 2024 for A fib. She takes bariatric vitamins plus chewable iron, herbal blood builder. Craves for ice chips  MEDICAL HISTORY:  Past Medical History:  Diagnosis Date   Asthma due to seasonal allergies    Bradycardia    Diastolic dysfunction    Dysplasia of cervix, low grade (CIN 1)    GERD (gastroesophageal reflux disease)    Hiatal hernia  a.) s/p repair concurrently with biliopancreatic diversion with duodenal switch procedure in 2017   History of diabetes mellitus, type II    a.) resolved after bariatric surgery done in 2017   History of hiatal hernia s/p repair    History of kidney stones    History of obstructive sleep apnea    a.) resolved after bariatric surgery done in 2017; has had repeat PSG testing that was negative since surgery   HTN (hypertension)    Hyperlipidemia    Iron deficiency anemia    MRSA carrier    Multinodular goiter    NAFL (nonalcoholic fatty liver)    Nephrolithiasis     New onset atrial fibrillation (HCC) 09/29/2023   a.) Dx'd 09/2023; b.) CHA2DS2VASc = 3 (sex, HTN,  T2DM) as of 01/24/2024; b.) rate/rhythm maintained intrinsically without need for pharmacological intervention; chronically anticoagulated with apixaban    On apixaban  therapy    Osteoporosis    Primary biliary cirrhosis (HCC)    Skin cancer of nose    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 Therese Flash @[facility] , [place]   BRACHIOPLASTY Bilateral    BREAST BIOPSY Right 2017   FIBROCYSTIC CHANGES WITH CALCIFICATIONS   CESAREAN SECTION     x 2   COLONOSCOPY WITH PROPOFOL  N/A 09/11/2019   Procedure: COLONOSCOPY WITH PROPOFOL ;  Surgeon: Luke Salaam, MD;  Location: [facility];  Service: Gastroenterology;  Laterality: N/A;   CYSTOSCOPY WITH RETROGRADE PYELOGRAM, URETEROSCOPY AND STENT PLACEMENT Right 02/06/2022   CYSTOSCOPY WITH STENT PLACEMENT Right 09/10/2017   Procedure: CYSTOSCOPY WITH STENT PLACEMENT;  Surgeon: Geraline Knapp, MD;  Location: [facility];  Service: Urology;  Laterality: Right;   CYSTOSCOPY/URETEROSCOPY/HOLMIUM LASER/STENT PLACEMENT Right 04/24/2019   Procedure: CYSTOSCOPY/URETEROSCOPY/STENT PLACEMENT/ RIGHT RETROGRADE;  Surgeon: Christina Coyer, MD;  Location: [facility];  Service: Urology;  Laterality: Right;   DX LAPAROSCOPY/  EGD/  HIATAL HERNIA REPAIR CLOSURE OF INTERNAL MENSENTRY DEFECT  10/28/2017   @[facility] , [place]   EXTRACORPOREAL SHOCK WAVE  LITHOTRIPSY N/A 01/30/2019   Procedure: EXTRACORPOREAL SHOCK WAVE LITHOTRIPSY (ESWL) LEFT/ POSSIBLE RIGHT;  Surgeon: Christina Coyer, MD;  Location: [facility];  Service: Urology;  Laterality: N/A;   EXTRACORPOREAL SHOCK WAVE LITHOTRIPSY Right 03/20/2019   Procedure: EXTRACORPOREAL SHOCK WAVE LITHOTRIPSY (ESWL);  Surgeon: Trent Frizzle, MD;  Location: [facility];  Service: Urology;  Laterality: Right;   EXTRACORPOREAL SHOCK WAVE LITHOTRIPSY Left 10/30/2021   Procedure: EXTRACORPOREAL SHOCK WAVE LITHOTRIPSY (ESWL);  Surgeon: Andrez Banker, MD;  Location: [facility];  Service: Urology;  Laterality: Left;   LAPAROSCOPIC CHOLECYSTECTOMY  10/15/2016   @[facility]  , [place]   LAPAROTOMY N/A 01/26/2024   Procedure: LAPAROTOMY, EXPLORATORY of abdominal wall wounds, for removal of foreign body; ultrasound-guided aspiratio of seroma;  Surgeon: Flynn Hylan, MD;  Location: [facility];  Service: General;  Laterality: N/A;   LAPAROTOMY N/A 03/29/2024   Procedure: LAPAROTOMY, EXPLORATORY;  Surgeon: Flynn Hylan, MD;  Location: [facility];  Service: General;  Laterality: N/A;  Abdominal wall, drain/resection  seroma cavity   LIPOSUCTION     PANNICULECTOMY     2022   TONSILLECTOMY AND ADENOIDECTOMY  child   URETEROSCOPY WITH HOLMIUM LASER LITHOTRIPSY Right 09/10/2017   Procedure: URETEROSCOPY WITH HOLMIUM LASER LITHOTRIPSY;  Surgeon: Geraline Knapp, MD;  Location: [facility];  Service: Urology;  Laterality: Right;    SOCIAL HISTORY: Social History   Socioeconomic History   Marital status: Single    Spouse name: Not on file   Number of children: 2   Years of education: 14   Highest education level: Not on file  Occupational History   Occupation: Microbiology Supervisor    Employer: [employer]   Occupation: Teaches for [employer]     Comment: Through [employer]   Occupation: Teaches microbiology   Occupation: Technical brewer    Comment: Microbiology - Weekend  Tobacco Use   Smoking status:  Never    Passive exposure: Never   Smokeless tobacco: Never  Vaping Use   Vaping status: Never Used  Substance and Sexual Activity   Alcohol use: No    Alcohol/week: 0.0 standard drinks of alcohol   Drug use: Never   Sexual activity: Not Currently    Birth control/protection: Post-menopausal  Other Topics Concern   Not on file  Social History Narrative   [name] grew up partly in [place]  and then [place] . She lives in [place] with her two daughter. 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 She enjoys the outdoors, gardening.    Right handed   Two story home   Takes caffeine      Microbiologist      Social Drivers of Health   Financial Resource Strain: Low Risk  (04/12/2024)   Overall Financial Resource Strain (CARDIA)    Difficulty of Paying Living Expenses: Not hard at all  Food Insecurity: No Food Insecurity (04/12/2024)   Hunger Vital Sign    Worried About Running Out of Food in the Last Year: Never true    Ran Out of Food in the Last Year: Never true  Transportation Needs: No Transportation Needs (04/12/2024)   PRAPARE - Administrator, Civil Service (Medical): No    Lack of Transportation (Non-Medical): No  Physical Activity: Not on file  Stress: Not on file  Social Connections: Not on file  Intimate Partner Violence: Not At Risk (04/12/2024)   Humiliation, Afraid, Rape, and Kick questionnaire    Fear of Current or Ex-Partner: No    Emotionally Abused: No    Physically Abused: No    Sexually Abused: No    FAMILY HISTORY: Family History  Problem Relation Age of Onset   Seizures Mother    Alcohol abuse Mother    Hyperlipidemia Mother    Hypertension Mother    Lymphoma Mother        primary site : brain   Leukemia Mother    Parkinsonism Father    Hyperlipidemia Father    Hypertension Father    Parkinson's disease Father    Drug abuse Sister    Cancer Maternal Grandmother        breast cancer   Breast cancer Maternal  Grandmother 61   Liver cancer Paternal Grandfather    Kidney cancer Neg Hx    Prostate cancer Neg Hx     ALLERGIES:  is allergic to ivp dye [iodinated contrast media], latex, tolmetin, gallium nitrate, insulin  detemir, nsaids, and gadolinium derivatives.  MEDICATIONS:  Current Outpatient Medications  Medication Sig Dispense Refill  amLODipine  (NORVASC ) 5 MG tablet Take 1 tablet (5 mg total) by mouth as directed. Take 1 whole tablet (5 mg) in the morning AND Take half a tablet (2.5 mg) at bedtime. 135 tablet 3   apixaban  (ELIQUIS ) 5 MG TABS tablet Take 1 tablet (5 mg total) by mouth 2 (two) times daily. 60 tablet 11   Ferrous Sulfate (IRON PO) Take 45 mg by mouth daily.     Multiple Vitamins-Minerals (BARIATRIC MULTIVITAMINS/IRON PO) Take 1 tablet by mouth daily.     ursodiol  (ACTIGALL ) 250 MG tablet Take 500-750 mg by mouth See admin instructions. 750 mg in the morning, 500 mg at bedtime     No current facility-administered medications for this visit.    Review of Systems  Constitutional:  Positive for fatigue. Negative for appetite change, chills and fever.  HENT:   Negative for hearing loss and voice change.   Eyes:  Negative for eye problems.  Respiratory:  Negative for chest tightness and cough.   Cardiovascular:  Negative for chest pain.  Gastrointestinal:  Negative for abdominal distention, abdominal pain and blood in stool.  Endocrine: Negative for hot flashes.  Genitourinary:  Negative for difficulty urinating and frequency.   Musculoskeletal:  Negative for arthralgias.  Skin:  Negative for itching and rash.  Neurological:  Negative for extremity weakness.  Hematological:  Negative for adenopathy.  Psychiatric/Behavioral:  Negative for confusion.     PHYSICAL EXAMINATION: Vitals:   04/12/24 1453  BP: 134/83  Pulse: 85  Resp: 18  Temp: 97.8 F (36.6 C)  SpO2: 99%   Filed Weights   04/12/24 1453  Weight: 172 lb 8 oz (78.2 kg)    Physical Exam Constitutional:       General: She is not in acute distress. HENT:     Head: Normocephalic and atraumatic.  Eyes:     General: No scleral icterus. Cardiovascular:     Rate and Rhythm: Normal rate and regular rhythm.     Heart sounds: Normal heart sounds.  Pulmonary:     Effort: Pulmonary effort is normal. No respiratory distress.     Breath sounds: No wheezing.  Abdominal:     General: Bowel sounds are normal. There is no distension.     Palpations: Abdomen is soft.  Musculoskeletal:        General: No deformity. Normal range of motion.     Cervical back: Normal range of motion and neck supple.  Skin:    General: Skin is warm and dry.     Findings: No erythema or rash.  Neurological:     Mental Status: She is alert and oriented to person, place, and time. Mental status is at baseline.     Cranial Nerves: No cranial nerve deficit.     Coordination: Coordination normal.  Psychiatric:        Mood and Affect: Mood normal.      LABORATORY DATA:  I have reviewed the data as listed    Latest Ref Rng & Units 04/12/2024    3:46 PM 03/15/2024    9:37 AM 02/22/2024    7:18 AM  CBC  WBC 4.0 - 10.5 K/uL 3.9  5.9  6.5   Hemoglobin 12.0 - 15.0 g/dL 69.6  29.5  28.4   Hematocrit 36.0 - 46.0 % 34.8  33.4  35.8   Platelets 150 - 400 K/uL 204  256  271       Latest Ref Rng & Units 02/22/2024    7:18 AM  12/15/2023    2:23 PM 10/12/2023    9:40 AM  CMP  Glucose 70 - 99 mg/dL 73  85  161   BUN 6 - 24 mg/dL 21  13  16    Creatinine 0.57 - 1.00 mg/dL 0.96  0.45  4.09   Sodium 134 - 144 mmol/L 143  145  146   Potassium 3.5 - 5.2 mmol/L 4.4  4.0  4.2   Chloride 96 - 106 mmol/L 108  110  110   CO2 20 - 29 mmol/L 20  23  23    Calcium  8.7 - 10.2 mg/dL 9.3  8.7  8.8   Total Protein 6.0 - 8.5 g/dL 6.9     Total Bilirubin 0.0 - 1.2 mg/dL <8.1     Alkaline Phos 44 - 121 IU/L 220     AST 0 - 40 IU/L 25     ALT 0 - 32 IU/L 20      Lab Results  Component Value Date   IRON 27 (L) 04/12/2024   TIBC 508 (H)  04/12/2024   IRONPCTSAT 5 (L) 04/12/2024   FERRITIN 5 (L) 04/12/2024     RADIOGRAPHIC STUDIES: I have personally reviewed the radiological images as listed and agreed with the findings in the report. No results found.

## 2024-04-13 NOTE — Telephone Encounter (Signed)
-----   Message from Timmy Forbes sent at 04/12/2024 11:52 PM EDT ----- Please arrange IV venofer weekly x 4.  Follow up in 3 months lab prior to MD +/- Venofer Thanks.

## 2024-04-13 NOTE — Telephone Encounter (Signed)
 Pt notified of follow up plan via myhcart. Please schedule and notify pt of appts  Venofer weekly x4 (first dose NEW)  3 months: labs prior to MD/ +/- venofer

## 2024-04-19 ENCOUNTER — Inpatient Hospital Stay

## 2024-04-19 VITALS — BP 138/75 | HR 80 | Temp 99.0°F | Resp 17

## 2024-04-19 DIAGNOSIS — D509 Iron deficiency anemia, unspecified: Secondary | ICD-10-CM | POA: Diagnosis not present

## 2024-04-19 DIAGNOSIS — D508 Other iron deficiency anemias: Secondary | ICD-10-CM

## 2024-04-19 MED ORDER — SODIUM CHLORIDE 0.9% FLUSH
10.0000 mL | Freq: Once | INTRAVENOUS | Status: AC | PRN
  Administered 2024-04-19: 10 mL
  Filled 2024-04-19: qty 10

## 2024-04-19 MED ORDER — IRON SUCROSE 20 MG/ML IV SOLN
200.0000 mg | Freq: Once | INTRAVENOUS | Status: AC
  Administered 2024-04-19: 200 mg via INTRAVENOUS
  Filled 2024-04-19: qty 10

## 2024-04-19 NOTE — Progress Notes (Signed)
Patient tolerated initial Venofer infusion well, no questions/concerns voiced. Monitored 30 min post transfusion. Patient stable at discharge. VSS. AVS given.

## 2024-04-19 NOTE — Patient Instructions (Signed)

## 2024-04-20 LAB — URINALYSIS, ROUTINE W REFLEX MICROSCOPIC
Bilirubin, UA: NEGATIVE
Glucose, UA: NEGATIVE
Ketones, UA: NEGATIVE
Nitrite, UA: POSITIVE — AB
RBC, UA: NEGATIVE
Specific Gravity, UA: 1.023 (ref 1.005–1.030)
Urobilinogen, Ur: 0.2 mg/dL (ref 0.2–1.0)
pH, UA: 6 (ref 5.0–7.5)

## 2024-04-20 LAB — MICROSCOPIC EXAMINATION: Casts: NONE SEEN /LPF

## 2024-04-23 LAB — URINE CULTURE

## 2024-04-24 ENCOUNTER — Ambulatory Visit: Payer: Self-pay | Admitting: Family

## 2024-04-24 MED ORDER — NITROFURANTOIN MONOHYD MACRO 100 MG PO CAPS: 100.0000 mg | ORAL_CAPSULE | Freq: Two times a day (BID) | ORAL | 0 refills | Status: DC

## 2024-04-26 ENCOUNTER — Inpatient Hospital Stay

## 2024-04-26 VITALS — BP 140/91 | HR 76 | Temp 98.6°F | Resp 18

## 2024-04-26 DIAGNOSIS — D509 Iron deficiency anemia, unspecified: Secondary | ICD-10-CM | POA: Diagnosis not present

## 2024-04-26 DIAGNOSIS — D508 Other iron deficiency anemias: Secondary | ICD-10-CM

## 2024-04-26 MED ORDER — IRON SUCROSE 20 MG/ML IV SOLN
200.0000 mg | Freq: Once | INTRAVENOUS | Status: AC
  Administered 2024-04-26: 200 mg via INTRAVENOUS

## 2024-04-26 MED ORDER — SODIUM CHLORIDE 0.9% FLUSH
10.0000 mL | Freq: Once | INTRAVENOUS | Status: AC | PRN
  Administered 2024-04-26: 10 mL
  Filled 2024-04-26: qty 10

## 2024-04-27 LAB — VITAMIN C: Vitamin C: 1 mg/dL (ref 0.4–2.0)

## 2024-04-27 LAB — VITAMIN E
Vitamin E (Alpha Tocopherol): 5 mg/L — ABNORMAL LOW (ref 7.0–25.1)
Vitamin E(Gamma Tocopherol): 0.4 mg/L — ABNORMAL LOW (ref 0.5–5.5)

## 2024-04-27 LAB — VITAMIN D 25 HYDROXY (VIT D DEFICIENCY, FRACTURES): Vit D, 25-Hydroxy: 19.7 ng/mL — ABNORMAL LOW (ref 30.0–100.0)

## 2024-04-27 LAB — VITAMIN K1, SERUM: VITAMIN K1: <0.1 ng/mL — ABNORMAL LOW (ref 0.10–2.20)

## 2024-04-27 LAB — VITAMIN A: Vitamin A: 15.9 ug/dL — ABNORMAL LOW (ref 20.1–62.0)

## 2024-05-03 ENCOUNTER — Inpatient Hospital Stay

## 2024-05-03 VITALS — BP 140/90 | HR 78 | Temp 97.2°F | Resp 18

## 2024-05-03 DIAGNOSIS — D509 Iron deficiency anemia, unspecified: Secondary | ICD-10-CM | POA: Diagnosis not present

## 2024-05-03 DIAGNOSIS — D508 Other iron deficiency anemias: Secondary | ICD-10-CM

## 2024-05-03 MED ORDER — IRON SUCROSE 20 MG/ML IV SOLN
200.0000 mg | Freq: Once | INTRAVENOUS | Status: AC
  Administered 2024-05-03: 200 mg via INTRAVENOUS

## 2024-05-03 NOTE — Patient Instructions (Signed)

## 2024-05-05 ENCOUNTER — Encounter: Payer: Self-pay | Admitting: Family

## 2024-05-10 ENCOUNTER — Inpatient Hospital Stay: Attending: Oncology

## 2024-05-10 VITALS — BP 140/91 | HR 66 | Resp 18

## 2024-05-10 DIAGNOSIS — Z79899 Other long term (current) drug therapy: Secondary | ICD-10-CM | POA: Diagnosis not present

## 2024-05-10 DIAGNOSIS — D509 Iron deficiency anemia, unspecified: Secondary | ICD-10-CM | POA: Diagnosis present

## 2024-05-10 DIAGNOSIS — D508 Other iron deficiency anemias: Secondary | ICD-10-CM

## 2024-05-10 MED ORDER — IRON SUCROSE 20 MG/ML IV SOLN
200.0000 mg | Freq: Once | INTRAVENOUS | Status: AC
  Administered 2024-05-10: 200 mg via INTRAVENOUS

## 2024-05-25 ENCOUNTER — Encounter: Payer: Self-pay | Admitting: Surgery

## 2024-05-25 ENCOUNTER — Ambulatory Visit (INDEPENDENT_AMBULATORY_CARE_PROVIDER_SITE_OTHER): Admitting: Surgery

## 2024-05-25 VITALS — BP 152/79 | HR 74 | Temp 98.6°F | Ht 66.5 in | Wt 173.8 lb

## 2024-05-25 DIAGNOSIS — S301XXD Contusion of abdominal wall, subsequent encounter: Secondary | ICD-10-CM | POA: Diagnosis not present

## 2024-05-25 NOTE — Patient Instructions (Signed)
Skin Abscess  A skin abscess is an infected spot of skin. It can have pus in it. An abscess can happen in any part of your body. Some abscesses break open (rupture) on their own. Most keep getting worse unless they are treated. If your abscess is not treated, the infection can spread deeper into your body and blood. This can make you feel sick. What are the causes? Germs that enter your skin. This may happen if you have: A cut or scrape. A wound from a needle or an insect bite. Blocked oil or sweat glands. A problem with the spot where your hair goes into your skin. A fluid-filled sac called a cyst under your skin. What increases the risk? Having problems with how your blood moves through your body. Having a weak body defense system (immune system). Having diabetes. Having dry and irritated skin. Needing to get shots often. Putting drugs into your body with a needle. Having a splinter or something else in your skin. Smoking. What are the signs or symptoms? A firm bump under your skin that hurts. A bump with pus at the top. Redness and swelling. Warm or tender spots. A sore on the skin. How is this treated? You may need to: Put a heat pack or a warm, wet washcloth on the spot. Have the pus drained. Take antibiotics. Follow these instructions at home: Medicines Take over-the-counter and prescription medicines only as told by your doctor. If you were prescribed antibiotics, take them as told by your doctor. Do not stop taking them even if you start to feel better. Abscess care  If you have an abscess that has not drained, put heat on it. Use the heat source that your doctor recommends, such as a moist heat pack or a heating pad. Place a towel between your skin and the heat source. Leave the heat on for 20-30 minutes. If your skin turns bright red, take off the heat right away to prevent burns. The risk of burns is higher if you cannot feel pain, heat, or cold. Follow  instructions from your doctor about how to take care of your abscess. Make sure you: Cover the abscess with a bandage. Wash your hands with soap and water for at least 20 seconds before and after you change your bandage. If you cannot use soap and water, use hand sanitizer. Change your bandage as told by your doctor. Check your abscess every day for signs that the infection is getting worse. Check for: More redness, swelling, or pain. More fluid or blood. Warmth. More pus or a worse smell. General instructions To keep the infection from spreading: Do not share personal items or towels. Do not go in a hot tub with others. Avoid making skin contact with others. Be careful when you get rid of used bandages or any pus from the abscess. Do not smoke or use any products that contain nicotine or tobacco. If you need help quitting, ask your doctor. Contact a doctor if: You see red streaks on your skin near the abscess. You have any signs of worse infection. You vomit every time you eat or drink. You have a fever, chills, or muscle aches. The cyst or abscess comes back. Get help right away if: You have very bad pain. You make less pee (urine) than normal. This information is not intended to replace advice given to you by your health care provider. Make sure you discuss any questions you have with your health care provider. Document Revised: 06/10/2022  Document Reviewed: 06/10/2022 Elsevier Patient Education  2024 [employer].

## 2024-05-25 NOTE — Progress Notes (Signed)
 [facility] POST-OP OFFICE VISIT  05/25/2024  HPI: [name] is a 58 y.o. female had surgery on Mar 29, 2024, now s/p removal of retained permanent suture causing persisting seromatous cavity in the right lower quadrant scar area.   She returns today with a new area of pain and tenderness with associated redness of the upper midline scar, where I have not been personally removing retained permanent suture material.  She reports no drainage, no fevers or chills.  Just red, baseball sized area.  She reports having been doing some heavy core exercises [place].  She reports that this has spontaneously resolved since its onset, she has not taken any antibiotics, not had any drainage.  Just a bit of a mystery why it has improved.  Pain has essentially resolved.   Vital signs: BP (!) 152/79   Pulse 74   Temp 98.6 F (37 C) (Oral)   Ht 5' 6.5 (1.689 m)   Wt 173 lb 12.8 oz (78.8 kg)   LMP 07/11/2016 (Exact Date)   SpO2 98%   BMI 27.63 kg/m    Physical Exam: Constitutional: She appears well, quite pleased happy and mobile. Abdomen: Incision of upper abd, well cephalad to the prior open scar I created in the past, this epigastric scar is fine and clean, except for a very small area that is pink with a little central area on the scar that blanches.  There is some surrounding hardness like and induration.  She reports this has significantly improved and has no explanation as to why. Skin shows no evidence of cellulitis warranting initiation of antibiotics at this point.  Question if this may be a mechanical process from her recent core exercises.  As it has improved to this point it may continue to improve without intervention.  Assessment/Plan: This is a 58 y.o. female as above.    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   Paroxysmal atrial fibrillation with RVR (HCC) 09/29/2023   Open abdominal wall wound 09/29/2023   Hypokalemia 09/29/2023   Near syncope 09/29/2023   Left lower quadrant abdominal pain 06/03/2023   Wound dehiscence, surgical, sequela 05/10/2023   Poison ivy 05/03/2023   Overeating 02/22/2023   Abnormal brain MRI 02/22/2023   Tremor 01/11/2023   Abdominal hernia 09/18/2022   Abdominal distention 01/09/2022   Anemia 07/01/2021   Dysuria 07/01/2021   Dyspareunia, female 06/30/2021   Low back pain 12/09/2020   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   Headache 07/05/2020   Osteomalacia 12/27/2019   History of weight loss 11/28/2019   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   Localized osteoporosis without current pathological fracture 06/26/2019   Hypocalcemia 06/26/2019   Secondary hyperparathyroidism (HCC) 06/26/2019   Multinodular goiter 06/26/2019   Dysphagia 05/05/2019   Osteoporosis 05/03/2019   Memory loss 02/13/2019   Elevated liver function tests 10/13/2017   Epigastric abdominal pain 10/13/2017   History of kidney stones 10/13/2017   Hypertension 10/13/2017   Status post bariatric surgery 09/03/2017   Right ureteral calculus 08/31/2017   Hydronephrosis with urinary obstruction due to ureteral calculus 08/31/2017   Nephrolithiasis 08/31/2017   Screen for STD (sexually transmitted disease) 07/26/2017   Sleep apnea 10/06/2016   Candidal intertrigo 09/21/2016  H/O gastric bypass 09/21/2016   Hiatal hernia 04/21/2016   Elevated liver enzymes 09/04/2014   Vitamin D  deficiency 09/03/2014   Routine general medical examination at a health care facility 01/11/2014   T2DM (type 2 diabetes mellitus) (HCC) 01/11/2014    - Options explored, she does not want to go to the OR.  We did discuss a limited I&D to drain the area and possibly find more foreign body here  in the office under local anesthesia.  However she is very reluctant to do this without some form of sedation having been traumatized in the past.  As she has really progressed/improved to this point, this may continue without intervention.  As I would not expect an infectious process to ebb and flow without some form of drainage or antibiotic administration.  She agrees, and will return should it worsen.  We remain readily available to assist with drainage or even return to the OR for local MAC anesthesia and exploration for more residual retained permanent suture.  I personally spent a total of 20 minutes in the care of the patient today including getting/reviewing separately obtained history, performing a medically appropriate exam/evaluation, counseling and educating, and documenting clinical information in the EHR.  Honor Leghorn M.D., FACS 05/25/2024, 10:40 AM

## 2024-06-21 ENCOUNTER — Other Ambulatory Visit: Payer: Self-pay | Admitting: Family

## 2024-06-22 ENCOUNTER — Ambulatory Visit (INDEPENDENT_AMBULATORY_CARE_PROVIDER_SITE_OTHER): Payer: Managed Care, Other (non HMO) | Admitting: Internal Medicine

## 2024-06-22 ENCOUNTER — Encounter: Payer: Self-pay | Admitting: Internal Medicine

## 2024-06-22 ENCOUNTER — Telehealth: Payer: Self-pay | Admitting: Internal Medicine

## 2024-06-22 ENCOUNTER — Ambulatory Visit: Payer: Self-pay | Admitting: Family

## 2024-06-22 VITALS — BP 136/78 | HR 50 | Ht 66.5 in | Wt 177.4 lb

## 2024-06-22 DIAGNOSIS — E042 Nontoxic multinodular goiter: Secondary | ICD-10-CM

## 2024-06-22 DIAGNOSIS — M810 Age-related osteoporosis without current pathological fracture: Secondary | ICD-10-CM | POA: Diagnosis not present

## 2024-06-22 LAB — BASIC METABOLIC PANEL WITH GFR
BUN/Creatinine Ratio: 15 (ref 9–23)
BUN: 13 mg/dL (ref 6–24)
CO2: 21 mmol/L (ref 20–29)
Calcium: 8.7 mg/dL (ref 8.7–10.2)
Chloride: 109 mmol/L — ABNORMAL HIGH (ref 96–106)
Creatinine, Ser: 0.85 mg/dL (ref 0.57–1.00)
Glucose: 74 mg/dL (ref 70–99)
Potassium: 4.3 mmol/L (ref 3.5–5.2)
Sodium: 144 mmol/L (ref 134–144)
eGFR: 80 mL/min/1.73 (ref >59)

## 2024-06-22 MED ORDER — DENOSUMAB 60 MG/ML ~~LOC~~ SOSY: 60.0000 mg | PREFILLED_SYRINGE | Freq: Once | SUBCUTANEOUS | Status: DC

## 2024-06-22 NOTE — Telephone Encounter (Signed)
 Can we add this pt to the [facility]  list?    Thanks

## 2024-06-22 NOTE — Progress Notes (Signed)
 Name: [name]  MRN/ DOB: [id number], [birth year]    Age/ Sex: 58 y.o., female     PCP: Dineen Rollene MATSU, FNP   Reason for Endocrinology Evaluation: Thyroid  nodule /osteoporosis     Initial Endocrinology Clinic Visit: 06/26/2019    PATIENT IDENTIFIER: [name] is a 58 y.o., female with a past medical history of HTN,T2DM and Hx of gastric bypass and primary biliary cholangitis . She has followed with [facility] since 06/26/2019 for consultative assistance with management of her thyroid  nodules .   HISTORICAL SUMMARY:  Was diagnosed with MNG in 2006. Per pt she had drainage of a thyroid  cyst with benign cytology. In 2020 she presented to her PCP with c/o worsening neck enlargement, associated with dysphagia,and discomfort. An ultrasound showed a left thyroid  nodule that met criteria for FNA. The nodule was smaller then it has been years ago and the pt opted not to proceed with FNA .     Osteoporosis History: She is S/P Gastric Bypass sx in  07/2016 , Pre-surgery weight was 333 Lbs, lowest weight since sx 150 lbs. She was on 2 bariatric vitamins up to a year ago and was stopped due to recurrent renal stones, stopping the vitamins did not help and had ~ 17 renal stones in the past year. Pt had a DXA 04/14/2019 with a T-Score of -3.0 which prompted her to start taking her vitamins .  Pt opted to avoid all anti-resorptive medications due to fear of side effects , we did offer Prolia , zoledronic acid, and Evenity   SUBJECTIVE:     Today (06/22/2024):  [name] is here for a follow up on  MNG.  She continues to follow up at [facility] for primary biliary cholangitis. On ursodiol .  She underwent exploratory laparotomy on 01/26/2024 due to persistent abdominal wound drainage and had repeat surgery in May, 2025 with the removal of a retained permanent suture causing persistent seromatous cavity in the right lower quadrant scar area.  She continues with  a different area of the abdominal skin irritation   She has been struggling with fatigue and not feeling good  Overall weight fluctuates  No local neck swelling  She had once episode of chest pain ~ 2 weeks ago that she attributes to possibly GI issues. She does follow up with cardiology  Has chronic loose stools  Saw dermatology for skin tags  Has lowe back pain  Works with a trainer twice a week   Bariatric advantage 2 tabs daily  ( each tab contains 85 mg of calcium  and 1500 iu of vitamin D3)  4 of the Raw calcium  ( 1600 iu vitamin D  and 1100 mg of calcium )  Vitamin D  2000 iu  Vitamin A  10,000    HISTORY:  Past Medical History:  Past Medical History:  Diagnosis Date   Asthma due to seasonal allergies    Bradycardia    Diastolic dysfunction    Dysplasia of cervix, low grade (CIN 1)    GERD (gastroesophageal reflux disease)    Hiatal hernia    a.) s/p repair concurrently with biliopancreatic diversion with duodenal switch procedure in 2017   History of diabetes mellitus, type II    a.) resolved after bariatric surgery done in 2017   History of hiatal hernia s/p repair    History of kidney stones    History of obstructive sleep apnea    a.) resolved after bariatric surgery done in 2017; has had repeat PSG  testing that was negative since surgery   HTN (hypertension)    Hyperlipidemia    Iron  deficiency anemia    MRSA carrier    Multinodular goiter    NAFL (nonalcoholic fatty liver)    Nephrolithiasis    New onset atrial fibrillation (HCC) 09/29/2023   a.) Dx'd 09/2023; b.) CHA2DS2VASc = 3 (sex, HTN,  T2DM) as of 01/24/2024; b.) rate/rhythm maintained intrinsically without need for pharmacological intervention; chronically anticoagulated with apixaban    On apixaban  therapy    Osteoporosis    Primary biliary cirrhosis (HCC)    Skin cancer of nose    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 Wolm @[facility] , [place]   BRACHIOPLASTY Bilateral    BREAST BIOPSY Right 2017   FIBROCYSTIC CHANGES WITH CALCIFICATIONS   CESAREAN SECTION     x 2   COLONOSCOPY WITH PROPOFOL  N/A 09/11/2019   Procedure: COLONOSCOPY WITH PROPOFOL ;  Surgeon: Therisa Bi, MD;  Location: [facility];  Service: Gastroenterology;  Laterality: N/A;   CYSTOSCOPY WITH RETROGRADE PYELOGRAM, URETEROSCOPY AND STENT PLACEMENT Right 02/06/2022   CYSTOSCOPY WITH STENT PLACEMENT Right 09/10/2017   Procedure: CYSTOSCOPY WITH STENT PLACEMENT;  Surgeon: Twylla Glendia BROCKS, MD;  Location: [facility];  Service: Urology;  Laterality: Right;   CYSTOSCOPY/URETEROSCOPY/HOLMIUM LASER/STENT PLACEMENT Right 04/24/2019   Procedure: CYSTOSCOPY/URETEROSCOPY/STENT PLACEMENT/ RIGHT RETROGRADE;  Surgeon: Nieves Cough, MD;  Location: [facility];  Service: Urology;  Laterality: Right;   DX LAPAROSCOPY/  EGD/  HIATAL HERNIA REPAIR CLOSURE OF INTERNAL MENSENTRY DEFECT  10/28/2017   @[facility] , [place]   EXTRACORPOREAL SHOCK WAVE LITHOTRIPSY N/A 01/30/2019   Procedure: EXTRACORPOREAL SHOCK WAVE LITHOTRIPSY (ESWL) LEFT/ POSSIBLE RIGHT;  Surgeon: Nieves Cough, MD;  Location: [facility];  Service: Urology;  Laterality: N/A;   EXTRACORPOREAL SHOCK WAVE LITHOTRIPSY Right 03/20/2019   Procedure: EXTRACORPOREAL SHOCK WAVE LITHOTRIPSY (ESWL);  Surgeon: Matilda Senior, MD;  Location: [facility];  Service: Urology;  Laterality: Right;   EXTRACORPOREAL SHOCK WAVE LITHOTRIPSY Left 10/30/2021    Procedure: EXTRACORPOREAL SHOCK WAVE LITHOTRIPSY (ESWL);  Surgeon: Cam Morene ORN, MD;  Location: [facility];  Service: Urology;  Laterality: Left;   LAPAROSCOPIC CHOLECYSTECTOMY  10/15/2016   @[facility]  , [place]   LAPAROTOMY N/A 01/26/2024   Procedure: LAPAROTOMY, EXPLORATORY of abdominal wall wounds, for removal of foreign body; ultrasound-guided aspiratio of seroma;  Surgeon: Lane Shope, MD;  Location: [facility];  Service: General;  Laterality: N/A;   LAPAROTOMY N/A 03/29/2024   Procedure: LAPAROTOMY, EXPLORATORY;  Surgeon: Lane Shope, MD;  Location: [facility];  Service: General;  Laterality: N/A;  Abdominal wall, drain/resection seroma cavity   LIPOSUCTION     PANNICULECTOMY     2022   TONSILLECTOMY AND ADENOIDECTOMY  child   URETEROSCOPY WITH HOLMIUM LASER LITHOTRIPSY Right 09/10/2017   Procedure: URETEROSCOPY WITH HOLMIUM LASER LITHOTRIPSY;  Surgeon: Twylla Glendia BROCKS, MD;  Location: [facility];  Service: Urology;  Laterality: Right;   Social History:  reports that she has never smoked. She has never  been exposed to tobacco smoke. She has never used smokeless tobacco. She reports that she does not drink alcohol and does not use drugs. Family History:  Family History  Problem Relation Age of Onset   Seizures Mother    Alcohol abuse Mother    Hyperlipidemia Mother    Hypertension Mother    Lymphoma Mother        primary site : brain   Leukemia Mother    Parkinsonism Father    Hyperlipidemia Father    Hypertension Father    Parkinson's disease Father    Drug abuse Sister    Cancer Maternal Grandmother        breast cancer   Breast cancer Maternal Grandmother 89   Liver cancer Paternal Grandfather    Kidney cancer Neg Hx    Prostate cancer Neg Hx      HOME MEDICATIONS: Allergies as of 06/22/2024       Reactions   Ivp Dye [iodinated Contrast Media]    Patient 'codes' with IV contrast   Latex Hives, Shortness Of Breath, Cough   Tolmetin Hives    Gallium Nitrate Hives   AND Gadolinium Derivatives   Insulin  Detemir Hives, Swelling   Lump at injection site   Nsaids Hives   Per pt stated has to take ibuprofen with a antihistimine   Gadolinium Derivatives Hives   Pt c/o itching after contrast injection 03/15/19 @ 8:45am. [facility] Per Dr. Luverne document this as allergic reaction.         Medication List        Accurate as of June 22, 2024  6:54 AM. If you have any questions, ask your nurse or doctor.          amLODipine  5 MG tablet Commonly known as: NORVASC  Take 1 tablet (5 mg total) by mouth as directed. Take 1 whole tablet (5 mg) in the morning AND Take half a tablet (2.5 mg) at bedtime.   apixaban  5 MG Tabs tablet Commonly known as: ELIQUIS  Take 1 tablet (5 mg total) by mouth 2 (two) times daily.   BARIATRIC MULTIVITAMINS/IRON  PO Take 1 tablet by mouth daily.   IRON  PO Take 45 mg by mouth daily.   nitrofurantoin  (macrocrystal-monohydrate) 100 MG capsule Commonly known as: Macrobid  Take 1 capsule (100 mg total) by mouth 2 (two) times daily. Take with food.   ursodiol  250 MG tablet Commonly known as: ACTIGALL  Take 500-750 mg by mouth See admin instructions. 750 mg in the morning, 500 mg at bedtime          OBJECTIVE:   PHYSICAL EXAM: VS: LMP 07/11/2016 (Exact Date)    EXAM: General: Pt appears well and is in NAD  Neck: General: Supple without adenopathy. Thyroid : Thyroid  size normal. Left  nodule appreciated.   Lungs: Clear with good BS bilat with no rales, rhonchi, or wheezes  Heart: Auscultation: RRR.  Abdomen:  soft, nontender, epigastric abdominal wound noted with clear discharge and another noted below the umbilicus  Extremities:  BL OZ:Umjrz  pretibial edema   Mental Status: Judgment, insight: Intact Orientation: Oriented to time, place, and person Mood and affect: No depression, anxiety, or agitation     DATA REVIEWED: Labs through [facility] 05/01/2024  BUN 8 CR 0.7 Glucose 87 Calcium   8.7 GFR 101 TSH 1.67    Thyroid  Ultrasound 08/11/2023  FINDINGS: Parenchymal Echotexture: Mildly heterogenous   Isthmus: 0.4 cm   Right lobe: 4.9 x 1.6 x 1.7 cm   Left lobe: 5.7 x 1.5  x 1.7 cm   _________________________________________________________   Estimated total number of nodules >/= 1 cm: 1   Number of spongiform nodules >/=  2 cm not described below (TR1): 0   Number of mixed cystic and solid nodules >/= 1.5 cm not described below (TR2): 0   _________________________________________________________   Nodule labeled 1 (previously 2) is a previously described spongiform TR 1 nodule in the inferior right thyroid  lobe measuring less than 1 cm in size. It remains similar in size and morphology. This nodule does NOT meet TI-RADS criteria for biopsy or dedicated follow-up.   Nodule labeled 2 (previously 3) is a previously biopsied predominantly solid nodule occupying a large portion of the inferior left thyroid  lobe measuring up to 2.5 cm on today's exam, previously 2.9 cm. It overall remains similar in size and morphology.   Note that nodule labeled 1 on previous examinations in the inferior right thyroid  lobe, although previously recommended for annual follow-up, is no longer discretely visualized on today's exam.   IMPRESSION: 1. Multinodular thyroid  gland.  No new or enlarging thyroid  nodules. 2. Stable appearance of previously biopsied nodule in the inferior left thyroid  lobe. Correlate with biopsy results. 3. Note that previously described nodule in the inferior right thyroid  lobe (nodule 1 on previous examinations) is no longer discretely visualized on today's study.   FNA 09/18/2020 Clinical History: Left; Inferior 2.7 cm;  Other 2 dimensions: 1.9 x 2  cm; TI-RADS - 4  Specimen Submitted:  A. THYROID  GLAND, LEFT, LLP, FINE NEEDLE  ASPIRATION:    FINAL MICROSCOPIC DIAGNOSIS:  - Consistent with benign follicular nodule (Bethesda category II)     DXA 10/18/2023  AP Spine L2-L4 10/18/2023 57.1 Osteoporosis -3.6 0.774 g/cm2 2.2% - AP Spine L2-L4 11/06/2020 54.1 Osteoporosis -3.7 0.757 g/cm2 - -   DualFemur Total Left 10/18/2023 57.1 Osteopenia -2.3 0.717 g/cm2 -15.5% Yes DualFemur Total Left 11/06/2020 54.1 Osteopenia -1.3 0.849 g/cm2 -4.3% Yes DualFemur Total Left 04/14/2019 52.6 Normal -1.0 0.887 g/cm2 - -   DualFemur Total Mean 10/18/2023 57.1 Osteopenia -2.1 0.738 g/cm2 -13.1% Yes DualFemur Total Mean 11/06/2020 54.1 Osteopenia -1.3 0.849 g/cm2 -3.4% Yes DualFemur Total Mean 04/14/2019 52.6 Normal -1.0 0.879 g/cm2 - -   ASSESSMENT: The BMD measured at AP Spine L2-L4 is 0.774 g/cm2 with a T-score of -3.6. This patient is considered osteoporotic according to World Health Organization [facility]) criteria. The scan quality is good. L-1 was excluded due to degenerative changes. Compared with prior study, there has been no significant change in the spine. Compared with prior study, there has been significant decrease in the total hip.  Old records , labs and images have been reviewed.    ASSESSMENT / PLAN / RECOMMENDATIONS:   Multinodular Goiter:    - Pt is clinically euthyroid  - No local neck symptoms  - FNA of the left inferior nodule was benign 07/25/2020 - She had a normal TSH through [facility] in June, 2025 - Repeat ultrasound 08/2023 showed stability of the left inferior and previously biopsied nodule - We will proceed with repeat thyroid  ultrasound    2.  Osteoporosis:  - Discussed importance of optimizing calcium  and vitamin D  intake - She works with a Systems analyst for weightbearing exercises twice weekly - Historically she has declined antiresorptive therapy, but today she is in agreement - We briefly discussed Evenity, Prolia , zoledronic acid - After reviewing the literature I have opted to avoid Evenity due to primary biliary cholangitis, and we will proceed with Prolia  - Discussed  risk of hypocalcemia  with Prolia , I would like to start her on calcitriol prior to Prolia  to prevent hypocalcemia     F/U in 6 yrs    Signed electronically by: Stefano Redgie Butts, MD  [facility] Endocrinology  [facility] [address] [place] [place], [place] [address] Phone: [phone] FAX: [phone]      CC: Dineen Rollene MATSU, FNP [address] [place] [address] Phone: [phone]  Fax: [phone]   Return to Endocrinology clinic as below: Future Appointments  Date Time Provider Department Center  06/22/2024  7:30 AM Kresta Templeman, Donell Redgie, MD [facility] None  07/12/2024  3:00 PM [facility] None  07/19/2024  2:30 PM Babara Call, MD [facility] None  07/19/2024  2:45 PM [facility] INFUSION CHAIR 15 [facility] None  07/24/2024  8:50 AM Gerard Frederick, NP [facility] None

## 2024-06-22 NOTE — Addendum Note (Signed)
 Addended by: OCTAVIO DIETRICH CROME on: 06/22/2024 08:24 AM   Modules accepted: Orders

## 2024-06-27 NOTE — Telephone Encounter (Signed)
Prolia VOB initiated via [email or web address]  Next Prolia inj DUE: new start

## 2024-06-28 ENCOUNTER — Ambulatory Visit
Admission: RE | Admit: 2024-06-28 | Discharge: 2024-06-28 | Disposition: A | Discharge status: Home / Self Care | Source: Ambulatory Visit | Attending: Internal Medicine | Admitting: Internal Medicine

## 2024-06-28 DIAGNOSIS — E042 Nontoxic multinodular goiter: Secondary | ICD-10-CM

## 2024-06-29 ENCOUNTER — Ambulatory Visit (INDEPENDENT_AMBULATORY_CARE_PROVIDER_SITE_OTHER): Admitting: Surgery

## 2024-06-29 ENCOUNTER — Encounter: Payer: Self-pay | Admitting: Surgery

## 2024-06-29 ENCOUNTER — Ambulatory Visit: Payer: Self-pay | Admitting: Surgery

## 2024-06-29 VITALS — BP 162/82 | HR 80 | Temp 98.3°F | Ht 66.5 in | Wt 175.6 lb

## 2024-06-29 DIAGNOSIS — T8149XA Infection following a procedure, other surgical site, initial encounter: Secondary | ICD-10-CM | POA: Diagnosis not present

## 2024-06-29 DIAGNOSIS — L02211 Cutaneous abscess of abdominal wall: Secondary | ICD-10-CM

## 2024-06-29 NOTE — H&P (View-Only) (Signed)
 Patient ID: [name], female   DOB: 09-05-1966, 58 y.o.   MRN: [id number]  Chief Complaint: Recurrent incisional abscess  History of Present Illness [name] is a 58 y.o. female with another abscess more cephalad than her previous ones along her midline scar.  This patient is well-known to me from prior history of abdominoplasty, subsequent suture abscesses, initially locally excised by her plastic surgeon.  More recently by myself, involving the midline incision in the right groin.  She now has a new abscess, reportedly in the area that had not been inflamed or draining more cephalad than I had previously worked.  She denies fevers or chills.  She does manipulate it from time to time to encourage the pus to come out.  She is requesting that I extend the expiration all the way to the xiphoid area to ensure complete removal of all residual permanent suture.  Now there is an area from the xiphoid to this point where there it appears to be no evidence of inflammatory change or evidence of suture abscess.  However she reports the current area that is draining was just like that clean and without evidence of inflammatory change.  Past Medical History Past Medical History:  Diagnosis Date   Asthma due to seasonal allergies    Bradycardia    Diastolic dysfunction    Dysplasia of cervix, low grade (CIN 1)    GERD (gastroesophageal reflux disease)    Hiatal hernia    a.) s/p repair concurrently with biliopancreatic diversion with duodenal switch procedure in 2017   History of diabetes mellitus, type II    a.) resolved after bariatric surgery done in 2017   History of hiatal hernia s/p repair    History of kidney stones    History of obstructive sleep apnea    a.) resolved after bariatric surgery done in 2017; has had repeat PSG testing that was negative since surgery   HTN (hypertension)    Hyperlipidemia    Iron  deficiency anemia    MRSA carrier    Multinodular goiter     NAFL (nonalcoholic fatty liver)    Nephrolithiasis    New onset atrial fibrillation (HCC) 09/29/2023   a.) Dx'd 09/2023; b.) CHA2DS2VASc = 3 (sex, HTN,  T2DM) as of 01/24/2024; b.) rate/rhythm maintained intrinsically without need for pharmacological intervention; chronically anticoagulated with apixaban    On apixaban  therapy    Osteoporosis    Primary biliary cirrhosis (HCC)    Skin cancer of nose    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 Wolm @[facility] , [place]   BRACHIOPLASTY Bilateral    BREAST BIOPSY Right 2017   FIBROCYSTIC CHANGES WITH CALCIFICATIONS   CESAREAN SECTION     x 2   COLONOSCOPY WITH PROPOFOL  N/A 09/11/2019   Procedure: COLONOSCOPY WITH PROPOFOL ;  Surgeon: Therisa Bi, MD;  Location: [facility];  Service: Gastroenterology;  Laterality: N/A;   CYSTOSCOPY WITH RETROGRADE PYELOGRAM, URETEROSCOPY AND STENT PLACEMENT Right 02/06/2022   CYSTOSCOPY WITH STENT PLACEMENT  Right 09/10/2017   Procedure: CYSTOSCOPY WITH STENT PLACEMENT;  Surgeon: Twylla Glendia BROCKS, MD;  Location: [facility];  Service: Urology;  Laterality: Right;   CYSTOSCOPY/URETEROSCOPY/HOLMIUM LASER/STENT PLACEMENT Right 04/24/2019   Procedure: CYSTOSCOPY/URETEROSCOPY/STENT PLACEMENT/ RIGHT RETROGRADE;  Surgeon: Nieves Cough, MD;  Location: [facility];  Service: Urology;  Laterality: Right;   DX LAPAROSCOPY/  EGD/  HIATAL HERNIA REPAIR CLOSURE OF INTERNAL MENSENTRY DEFECT  10/28/2017    @[facility] , [place]   EXTRACORPOREAL SHOCK WAVE LITHOTRIPSY N/A 01/30/2019   Procedure: EXTRACORPOREAL SHOCK WAVE LITHOTRIPSY (ESWL) LEFT/ POSSIBLE RIGHT;  Surgeon: Nieves Cough, MD;  Location: [facility];  Service: Urology;  Laterality: N/A;   EXTRACORPOREAL SHOCK WAVE LITHOTRIPSY Right 03/20/2019   Procedure: EXTRACORPOREAL SHOCK WAVE LITHOTRIPSY (ESWL);  Surgeon: Matilda Senior, MD;  Location: [facility];  Service: Urology;  Laterality: Right;   EXTRACORPOREAL SHOCK WAVE LITHOTRIPSY Left 10/30/2021   Procedure: EXTRACORPOREAL SHOCK WAVE LITHOTRIPSY (ESWL);  Surgeon: Cam Morene ORN, MD;  Location: [facility];  Service: Urology;  Laterality: Left;   LAPAROSCOPIC CHOLECYSTECTOMY  10/15/2016   @[facility]  , [place]   LAPAROTOMY N/A 01/26/2024   Procedure: LAPAROTOMY, EXPLORATORY of abdominal wall wounds, for removal of foreign body; ultrasound-guided aspiratio of seroma;  Surgeon: Lane Shope, MD;  Location: [facility];  Service: General;  Laterality: N/A;   LAPAROTOMY N/A 03/29/2024   Procedure: LAPAROTOMY, EXPLORATORY;  Surgeon: Lane Shope, MD;  Location: [facility];  Service: General;  Laterality: N/A;  Abdominal wall, drain/resection seroma cavity   LIPOSUCTION     PANNICULECTOMY     2022   TONSILLECTOMY AND ADENOIDECTOMY  child   URETEROSCOPY WITH HOLMIUM LASER LITHOTRIPSY Right 09/10/2017   Procedure: URETEROSCOPY WITH HOLMIUM LASER LITHOTRIPSY;  Surgeon: Twylla Glendia BROCKS, MD;  Location: [facility];  Service: Urology;  Laterality: Right;    Allergies  Allergen Reactions   Ivp Dye [Iodinated Contrast Media]     Patient 'codes' with IV contrast   Latex Hives, Shortness Of Breath and Cough   Tolmetin Hives   Gallium Nitrate Hives    AND Gadolinium Derivatives    Insulin  Detemir Hives and Swelling    Lump at injection site   Nsaids Hives    Per pt stated has to take ibuprofen with a antihistimine   Gadolinium Derivatives Hives    Pt c/o itching after contrast  injection 03/15/19 @ 8:45am. [facility] Per Dr. Luverne document this as allergic reaction.     Current Outpatient Medications  Medication Sig Dispense Refill   amLODipine  (NORVASC ) 5 MG tablet Take 1 tablet (5 mg total) by mouth as directed. Take 1 whole tablet (5 mg) in the morning AND Take half a tablet (2.5 mg) at bedtime. 135 tablet 3   apixaban  (ELIQUIS ) 5 MG TABS tablet Take 1 tablet (5 mg total) by mouth 2 (two) times daily. 60 tablet 11   Multiple Vitamins-Minerals (BARIATRIC MULTIVITAMINS/IRON  PO) Take 1 tablet by mouth daily.     ursodiol  (ACTIGALL ) 250 MG tablet Take 500-750 mg by mouth See admin instructions. 750 mg in the morning, 500 mg at bedtime     Current Facility-Administered Medications  Medication Dose Route Frequency Provider Last Rate Last Admin   [START ON 07/06/2024] denosumab  (PROLIA ) injection 60 mg  60 mg Subcutaneous Once Shamleffer, Ibtehal Jaralla, MD        Family History Family History  Problem Relation Age of Onset   Seizures Mother    Alcohol abuse Mother    Hyperlipidemia Mother    Hypertension  Mother    Lymphoma Mother        primary site : brain   Leukemia Mother    Parkinsonism Father    Hyperlipidemia Father    Hypertension Father    Parkinson's disease Father    Drug abuse Sister    Cancer Maternal Grandmother        breast cancer   Breast cancer Maternal Grandmother 47   Liver cancer Paternal Grandfather    Kidney cancer Neg Hx    Prostate cancer Neg Hx       Social History Social History   Tobacco Use   Smoking status: Never    Passive exposure: Never   Smokeless tobacco: Never  Vaping Use   Vaping status: Never Used  Substance Use Topics   Alcohol use: No    Alcohol/week: 0.0 standard drinks of alcohol   Drug use: Never        Review of Systems  All other systems reviewed and are negative.    Physical Exam Blood pressure (!) 162/82, pulse 80, temperature 98.3 F (36.8 C), temperature source Oral, height 5' 6.5 (1.689  m), weight 175 lb 9.6 oz (79.7 kg), last menstrual period 07/11/2016, SpO2 97%. Last Weight  Most recent update: 06/29/2024  9:04 AM    Weight  79.7 kg (175 lb 9.6 oz)             CONSTITUTIONAL: Well developed, and nourished, appropriately responsive and aware without distress.   EYES: Sclera non-icteric.   EARS, NOSE, MOUTH AND THROAT:  The oropharynx is clear. Oral mucosa is pink and moist.    Hearing is intact to voice.  NECK: Trachea is midline, and there is no jugular venous distension.  LYMPH NODES:  Lymph nodes in the neck are not appreciated. RESPIRATORY:  Lungs are clear, and breath sounds are equal bilaterally.  Normal respiratory effort without pathologic use of accessory muscles. CARDIOVASCULAR: Heart is regular in rate and rhythm.   Well perfused.  GI: The abdomen is notable for a draining, raised, hypergranulating epigastric lesion.  Cephalad to this is a clean well-healed midline incision.  Caudal to this is scar from her prior procedures of suture removal.  She is otherwise soft, nontender, and nondistended. There were no palpable masses.  MUSCULOSKELETAL:  Symmetrical muscle tone appreciated in all four extremities.    SKIN: Skin turgor is normal. No pathologic skin lesions appreciated.  NEUROLOGIC:  Motor and sensation appear grossly normal.  Cranial nerves are grossly without defect. PSYCH:  Alert and oriented to person, place and time. Affect is appropriate for situation.  Data Reviewed I have personally reviewed what is currently available of the patient's imaging, recent labs and medical records.   Labs:     Latest Ref Rng & Units 04/12/2024    3:46 PM 03/15/2024    9:37 AM 02/22/2024    7:18 AM  CBC  WBC 4.0 - 10.5 K/uL 3.9  5.9  6.5   Hemoglobin 12.0 - 15.0 g/dL 89.5  89.6  88.9   Hematocrit 36.0 - 46.0 % 34.8  33.4  35.8   Platelets 150 - 400 K/uL 204  256  271       Latest Ref Rng & Units 06/21/2024    7:05 AM 02/22/2024    7:18 AM 12/15/2023    2:23 PM  CMP   Glucose 70 - 99 mg/dL 74  73  85   BUN 6 - 24 mg/dL 13  21  13  Creatinine 0.57 - 1.00 mg/dL 9.14  9.18  9.31   Sodium 134 - 144 mmol/L 144  143  145   Potassium 3.5 - 5.2 mmol/L 4.3  4.4  4.0   Chloride 96 - 106 mmol/L 109  108  110   CO2 20 - 29 mmol/L 21  20  23    Calcium  8.7 - 10.2 mg/dL 8.7  9.3  8.7   Total Protein 6.0 - 8.5 g/dL  6.9    Total Bilirubin 0.0 - 1.2 mg/dL  <9.7    Alkaline Phos 44 - 121 IU/L  220    AST 0 - 40 IU/L  25    ALT 0 - 32 IU/L  20      Imaging: Radiological images personally reviewed:   Within last 24 hrs: No results found.  Assessment    Recurring/new abscess of postoperative wound of abdominal wall, suspect persistent foreign body of permanent suture. 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   Paroxysmal atrial fibrillation with RVR (HCC) 09/29/2023   Open abdominal wall wound 09/29/2023   Hypokalemia 09/29/2023   Near syncope 09/29/2023   Left lower quadrant abdominal pain 06/03/2023   Wound dehiscence, surgical, sequela 05/10/2023   Poison ivy 05/03/2023   Overeating 02/22/2023   Abnormal brain MRI 02/22/2023   Tremor 01/11/2023   Abdominal hernia 09/18/2022   Abdominal distention 01/09/2022   Anemia 07/01/2021   Dysuria 07/01/2021   Dyspareunia, female 06/30/2021   Low back pain 12/09/2020   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   Headache 07/05/2020   Osteomalacia 12/27/2019   History of weight loss 11/28/2019   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   Localized osteoporosis without current pathological fracture 06/26/2019   Hypocalcemia 06/26/2019   Secondary hyperparathyroidism (HCC) 06/26/2019   Multinodular goiter 06/26/2019   Dysphagia 05/05/2019   Osteoporosis 05/03/2019   Memory loss  02/13/2019   Elevated liver function tests 10/13/2017   Epigastric abdominal pain 10/13/2017   History of kidney stones 10/13/2017   Hypertension 10/13/2017   Status post bariatric surgery 09/03/2017   Right ureteral calculus 08/31/2017   Hydronephrosis with urinary obstruction due to ureteral calculus 08/31/2017   Nephrolithiasis 08/31/2017   Screen for STD (sexually transmitted disease) 07/26/2017   Sleep apnea 10/06/2016   Candidal intertrigo 09/21/2016   H/O gastric bypass 09/21/2016   Hiatal hernia 04/21/2016   Elevated liver enzymes 09/04/2014   Vitamin D  deficiency 09/03/2014   Routine general medical examination at a health care facility 01/11/2014   T2DM (type 2 diabetes mellitus) (HCC) 01/11/2014    Plan    Abdominal wall wound exploration, with removal of likely retained foreign body.    I personally spent a total of 30 minutes in the care of the patient today including preparing to see the patient, getting/reviewing separately obtained history, performing a medically appropriate exam/evaluation, counseling and educating, placing orders, and documenting clinical information in the EHR.   These notes generated with voice recognition software. I apologize for typographical errors.  Honor Leghorn M.D., FACS 06/29/2024, 9:39 AM

## 2024-06-29 NOTE — Progress Notes (Signed)
 Patient ID: [name], female   DOB: 09-05-1966, 58 y.o.   MRN: [id number]  Chief Complaint: Recurrent incisional abscess  History of Present Illness [name] is a 58 y.o. female with another abscess more cephalad than her previous ones along her midline scar.  This patient is well-known to me from prior history of abdominoplasty, subsequent suture abscesses, initially locally excised by her plastic surgeon.  More recently by myself, involving the midline incision in the right groin.  She now has a new abscess, reportedly in the area that had not been inflamed or draining more cephalad than I had previously worked.  She denies fevers or chills.  She does manipulate it from time to time to encourage the pus to come out.  She is requesting that I extend the expiration all the way to the xiphoid area to ensure complete removal of all residual permanent suture.  Now there is an area from the xiphoid to this point where there it appears to be no evidence of inflammatory change or evidence of suture abscess.  However she reports the current area that is draining was just like that clean and without evidence of inflammatory change.  Past Medical History Past Medical History:  Diagnosis Date   Asthma due to seasonal allergies    Bradycardia    Diastolic dysfunction    Dysplasia of cervix, low grade (CIN 1)    GERD (gastroesophageal reflux disease)    Hiatal hernia    a.) s/p repair concurrently with biliopancreatic diversion with duodenal switch procedure in 2017   History of diabetes mellitus, type II    a.) resolved after bariatric surgery done in 2017   History of hiatal hernia s/p repair    History of kidney stones    History of obstructive sleep apnea    a.) resolved after bariatric surgery done in 2017; has had repeat PSG testing that was negative since surgery   HTN (hypertension)    Hyperlipidemia    Iron  deficiency anemia    MRSA carrier    Multinodular goiter     NAFL (nonalcoholic fatty liver)    Nephrolithiasis    New onset atrial fibrillation (HCC) 09/29/2023   a.) Dx'd 09/2023; b.) CHA2DS2VASc = 3 (sex, HTN,  T2DM) as of 01/24/2024; b.) rate/rhythm maintained intrinsically without need for pharmacological intervention; chronically anticoagulated with apixaban    On apixaban  therapy    Osteoporosis    Primary biliary cirrhosis (HCC)    Skin cancer of nose    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 Wolm @[facility] , [place]   BRACHIOPLASTY Bilateral    BREAST BIOPSY Right 2017   FIBROCYSTIC CHANGES WITH CALCIFICATIONS   CESAREAN SECTION     x 2   COLONOSCOPY WITH PROPOFOL  N/A 09/11/2019   Procedure: COLONOSCOPY WITH PROPOFOL ;  Surgeon: Therisa Bi, MD;  Location: [facility];  Service: Gastroenterology;  Laterality: N/A;   CYSTOSCOPY WITH RETROGRADE PYELOGRAM, URETEROSCOPY AND STENT PLACEMENT Right 02/06/2022   CYSTOSCOPY WITH STENT PLACEMENT  Right 09/10/2017   Procedure: CYSTOSCOPY WITH STENT PLACEMENT;  Surgeon: Twylla Glendia BROCKS, MD;  Location: [facility];  Service: Urology;  Laterality: Right;   CYSTOSCOPY/URETEROSCOPY/HOLMIUM LASER/STENT PLACEMENT Right 04/24/2019   Procedure: CYSTOSCOPY/URETEROSCOPY/STENT PLACEMENT/ RIGHT RETROGRADE;  Surgeon: Nieves Cough, MD;  Location: [facility];  Service: Urology;  Laterality: Right;   DX LAPAROSCOPY/  EGD/  HIATAL HERNIA REPAIR CLOSURE OF INTERNAL MENSENTRY DEFECT  10/28/2017    @[facility] , [place]   EXTRACORPOREAL SHOCK WAVE LITHOTRIPSY N/A 01/30/2019   Procedure: EXTRACORPOREAL SHOCK WAVE LITHOTRIPSY (ESWL) LEFT/ POSSIBLE RIGHT;  Surgeon: Nieves Cough, MD;  Location: [facility];  Service: Urology;  Laterality: N/A;   EXTRACORPOREAL SHOCK WAVE LITHOTRIPSY Right 03/20/2019   Procedure: EXTRACORPOREAL SHOCK WAVE LITHOTRIPSY (ESWL);  Surgeon: Matilda Senior, MD;  Location: [facility];  Service: Urology;  Laterality: Right;   EXTRACORPOREAL SHOCK WAVE LITHOTRIPSY Left 10/30/2021   Procedure: EXTRACORPOREAL SHOCK WAVE LITHOTRIPSY (ESWL);  Surgeon: Cam Morene ORN, MD;  Location: [facility];  Service: Urology;  Laterality: Left;   LAPAROSCOPIC CHOLECYSTECTOMY  10/15/2016   @[facility]  , [place]   LAPAROTOMY N/A 01/26/2024   Procedure: LAPAROTOMY, EXPLORATORY of abdominal wall wounds, for removal of foreign body; ultrasound-guided aspiratio of seroma;  Surgeon: Lane Shope, MD;  Location: [facility];  Service: General;  Laterality: N/A;   LAPAROTOMY N/A 03/29/2024   Procedure: LAPAROTOMY, EXPLORATORY;  Surgeon: Lane Shope, MD;  Location: [facility];  Service: General;  Laterality: N/A;  Abdominal wall, drain/resection seroma cavity   LIPOSUCTION     PANNICULECTOMY     2022   TONSILLECTOMY AND ADENOIDECTOMY  child   URETEROSCOPY WITH HOLMIUM LASER LITHOTRIPSY Right 09/10/2017   Procedure: URETEROSCOPY WITH HOLMIUM LASER LITHOTRIPSY;  Surgeon: Twylla Glendia BROCKS, MD;  Location: [facility];  Service: Urology;  Laterality: Right;    Allergies  Allergen Reactions   Ivp Dye [Iodinated Contrast Media]     Patient 'codes' with IV contrast   Latex Hives, Shortness Of Breath and Cough   Tolmetin Hives   Gallium Nitrate Hives    AND Gadolinium Derivatives    Insulin  Detemir Hives and Swelling    Lump at injection site   Nsaids Hives    Per pt stated has to take ibuprofen with a antihistimine   Gadolinium Derivatives Hives    Pt c/o itching after contrast  injection 03/15/19 @ 8:45am. [facility] Per Dr. Luverne document this as allergic reaction.     Current Outpatient Medications  Medication Sig Dispense Refill   amLODipine  (NORVASC ) 5 MG tablet Take 1 tablet (5 mg total) by mouth as directed. Take 1 whole tablet (5 mg) in the morning AND Take half a tablet (2.5 mg) at bedtime. 135 tablet 3   apixaban  (ELIQUIS ) 5 MG TABS tablet Take 1 tablet (5 mg total) by mouth 2 (two) times daily. 60 tablet 11   Multiple Vitamins-Minerals (BARIATRIC MULTIVITAMINS/IRON  PO) Take 1 tablet by mouth daily.     ursodiol  (ACTIGALL ) 250 MG tablet Take 500-750 mg by mouth See admin instructions. 750 mg in the morning, 500 mg at bedtime     Current Facility-Administered Medications  Medication Dose Route Frequency Provider Last Rate Last Admin   [START ON 07/06/2024] denosumab  (PROLIA ) injection 60 mg  60 mg Subcutaneous Once Shamleffer, Ibtehal Jaralla, MD        Family History Family History  Problem Relation Age of Onset   Seizures Mother    Alcohol abuse Mother    Hyperlipidemia Mother    Hypertension  Mother    Lymphoma Mother        primary site : brain   Leukemia Mother    Parkinsonism Father    Hyperlipidemia Father    Hypertension Father    Parkinson's disease Father    Drug abuse Sister    Cancer Maternal Grandmother        breast cancer   Breast cancer Maternal Grandmother 47   Liver cancer Paternal Grandfather    Kidney cancer Neg Hx    Prostate cancer Neg Hx       Social History Social History   Tobacco Use   Smoking status: Never    Passive exposure: Never   Smokeless tobacco: Never  Vaping Use   Vaping status: Never Used  Substance Use Topics   Alcohol use: No    Alcohol/week: 0.0 standard drinks of alcohol   Drug use: Never        Review of Systems  All other systems reviewed and are negative.    Physical Exam Blood pressure (!) 162/82, pulse 80, temperature 98.3 F (36.8 C), temperature source Oral, height 5' 6.5 (1.689  m), weight 175 lb 9.6 oz (79.7 kg), last menstrual period 07/11/2016, SpO2 97%. Last Weight  Most recent update: 06/29/2024  9:04 AM    Weight  79.7 kg (175 lb 9.6 oz)             CONSTITUTIONAL: Well developed, and nourished, appropriately responsive and aware without distress.   EYES: Sclera non-icteric.   EARS, NOSE, MOUTH AND THROAT:  The oropharynx is clear. Oral mucosa is pink and moist.    Hearing is intact to voice.  NECK: Trachea is midline, and there is no jugular venous distension.  LYMPH NODES:  Lymph nodes in the neck are not appreciated. RESPIRATORY:  Lungs are clear, and breath sounds are equal bilaterally.  Normal respiratory effort without pathologic use of accessory muscles. CARDIOVASCULAR: Heart is regular in rate and rhythm.   Well perfused.  GI: The abdomen is notable for a draining, raised, hypergranulating epigastric lesion.  Cephalad to this is a clean well-healed midline incision.  Caudal to this is scar from her prior procedures of suture removal.  She is otherwise soft, nontender, and nondistended. There were no palpable masses.  MUSCULOSKELETAL:  Symmetrical muscle tone appreciated in all four extremities.    SKIN: Skin turgor is normal. No pathologic skin lesions appreciated.  NEUROLOGIC:  Motor and sensation appear grossly normal.  Cranial nerves are grossly without defect. PSYCH:  Alert and oriented to person, place and time. Affect is appropriate for situation.  Data Reviewed I have personally reviewed what is currently available of the patient's imaging, recent labs and medical records.   Labs:     Latest Ref Rng & Units 04/12/2024    3:46 PM 03/15/2024    9:37 AM 02/22/2024    7:18 AM  CBC  WBC 4.0 - 10.5 K/uL 3.9  5.9  6.5   Hemoglobin 12.0 - 15.0 g/dL 89.5  89.6  88.9   Hematocrit 36.0 - 46.0 % 34.8  33.4  35.8   Platelets 150 - 400 K/uL 204  256  271       Latest Ref Rng & Units 06/21/2024    7:05 AM 02/22/2024    7:18 AM 12/15/2023    2:23 PM  CMP   Glucose 70 - 99 mg/dL 74  73  85   BUN 6 - 24 mg/dL 13  21  13  Creatinine 0.57 - 1.00 mg/dL 9.14  9.18  9.31   Sodium 134 - 144 mmol/L 144  143  145   Potassium 3.5 - 5.2 mmol/L 4.3  4.4  4.0   Chloride 96 - 106 mmol/L 109  108  110   CO2 20 - 29 mmol/L 21  20  23    Calcium  8.7 - 10.2 mg/dL 8.7  9.3  8.7   Total Protein 6.0 - 8.5 g/dL  6.9    Total Bilirubin 0.0 - 1.2 mg/dL  <9.7    Alkaline Phos 44 - 121 IU/L  220    AST 0 - 40 IU/L  25    ALT 0 - 32 IU/L  20      Imaging: Radiological images personally reviewed:   Within last 24 hrs: No results found.  Assessment    Recurring/new abscess of postoperative wound of abdominal wall, suspect persistent foreign body of permanent suture. 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   Paroxysmal atrial fibrillation with RVR (HCC) 09/29/2023   Open abdominal wall wound 09/29/2023   Hypokalemia 09/29/2023   Near syncope 09/29/2023   Left lower quadrant abdominal pain 06/03/2023   Wound dehiscence, surgical, sequela 05/10/2023   Poison ivy 05/03/2023   Overeating 02/22/2023   Abnormal brain MRI 02/22/2023   Tremor 01/11/2023   Abdominal hernia 09/18/2022   Abdominal distention 01/09/2022   Anemia 07/01/2021   Dysuria 07/01/2021   Dyspareunia, female 06/30/2021   Low back pain 12/09/2020   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   Headache 07/05/2020   Osteomalacia 12/27/2019   History of weight loss 11/28/2019   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   Localized osteoporosis without current pathological fracture 06/26/2019   Hypocalcemia 06/26/2019   Secondary hyperparathyroidism (HCC) 06/26/2019   Multinodular goiter 06/26/2019   Dysphagia 05/05/2019   Osteoporosis 05/03/2019   Memory loss  02/13/2019   Elevated liver function tests 10/13/2017   Epigastric abdominal pain 10/13/2017   History of kidney stones 10/13/2017   Hypertension 10/13/2017   Status post bariatric surgery 09/03/2017   Right ureteral calculus 08/31/2017   Hydronephrosis with urinary obstruction due to ureteral calculus 08/31/2017   Nephrolithiasis 08/31/2017   Screen for STD (sexually transmitted disease) 07/26/2017   Sleep apnea 10/06/2016   Candidal intertrigo 09/21/2016   H/O gastric bypass 09/21/2016   Hiatal hernia 04/21/2016   Elevated liver enzymes 09/04/2014   Vitamin D  deficiency 09/03/2014   Routine general medical examination at a health care facility 01/11/2014   T2DM (type 2 diabetes mellitus) (HCC) 01/11/2014    Plan    Abdominal wall wound exploration, with removal of likely retained foreign body.    I personally spent a total of 30 minutes in the care of the patient today including preparing to see the patient, getting/reviewing separately obtained history, performing a medically appropriate exam/evaluation, counseling and educating, placing orders, and documenting clinical information in the EHR.   These notes generated with voice recognition software. I apologize for typographical errors.  Honor Leghorn M.D., FACS 06/29/2024, 9:39 AM

## 2024-06-29 NOTE — Patient Instructions (Signed)
 Exploratory Laparotomy, Adult, Care After After exploratory laparotomy, it's common to have pain, tiredness, bloating, and gas. You may also have no desire to eat food. Follow these instructions at home: Medicines Take over-the-counter and prescription medicines only as told by your health care provider. Take your antibiotics as told by your provider. Do not stop taking your antibiotics even if you start to feel better. Ask your provider if the medicine prescribed to you: Requires you to avoid driving or using machinery. Can cause constipation, or trouble pooping. You may need to take these actions to prevent or treat pooping problems: Drink enough fluid to keep your pee (urine) pale yellow. Take over-the-counter or prescription medicines. Eat foods that are high in fiber, such as beans, whole grains, and fresh fruits and vegetables. Limit foods that are high in fat and processed sugars, such as fried or sweet foods. Incision care  Follow instructions from your provider about how to take care of your incision, or the cut in your belly. Make sure you: Wash your hands with soap and water for at least 20 seconds before and after you change your bandage. If soap and water are not available, use hand sanitizer. Change your bandage as told by your provider. Leave stitches, skin glue, or tape strips in place. These skin closures may need to stay in place for 2 weeks or longer. If tape strip edges start to loosen and curl up, you may trim the loose edges. Do not remove tape strips completely unless your provider tells you to do that. If you were sent home with a drain, follow instructions from your provider about how to care for it. Check the cut in your belly every day for signs of infection. Check for: Redness, swelling, or more pain. Fluid or blood. Warmth. Pus or a bad smell. Activity  Rest as told by your provider. Do not sit for a long time without moving. Get up to take short walks every  1-2 hours. This will improve blood flow and breathing. Ask for help if you feel weak or unsteady. You may have to avoid lifting. Ask your provider how much you can safely lift. Return to your normal activities as told by your provider. Ask your provider what activities are safe for you. General instructions Do not use any products that contain nicotine or tobacco. These products include cigarettes, chewing tobacco, and vaping devices, such as e-cigarettes. These can delay incision healing after surgery. If you need help quitting, ask your provider. Do not take baths, swim, or use a hot tub until your provider approves. Ask your provider if you may take showers. You may only be allowed to take sponge baths. Keep all follow-up visits. Your provider will check for problems and make sure you are healing. Your provider may give you more instructions. Make sure you know what you can and can't do. Contact a health care provider if: You have a fever or chills. Your pain is getting worse or your pain medicine is not helping. You vomit or feel like you may vomit. You have watery poop. You have trouble pooping or have not had a bowel movement for more than 3 days. You have signs of infection around the cut that was made in your belly. Get help right away if: The cut in your belly opens up. You have warmth, tenderness, or swelling in your calf. You have trouble breathing. You have chest pain. These symptoms may be an emergency. Get help right away. Call 911. Do  not wait to see if the symptoms will go away. Do not drive yourself to the hospital. This information is not intended to replace advice given to you by your health care provider. Make sure you discuss any questions you have with your health care provider. Document Revised: 01/13/2023 Document Reviewed: 01/13/2023 Elsevier Patient Education  2024 [employer].

## 2024-06-30 NOTE — Progress Notes (Signed)
 Patient has been advised of Pre-Admission date/time, and Surgery date at [facility].  Surgery Date: 07/19/2024 Preadmission Testing Date: 07/17/24 (phone 8A-1P)  Patient has been made aware to call [phone], between 1-3:00pm the day before surgery, to find out what time to arrive for surgery.

## 2024-07-05 ENCOUNTER — Ambulatory Visit: Payer: Self-pay | Admitting: Internal Medicine

## 2024-07-12 ENCOUNTER — Other Ambulatory Visit

## 2024-07-13 NOTE — Telephone Encounter (Signed)
 Medical Buy and Annette St[employer] - Prior Authorization REQUIRED for [employer]

## 2024-07-15 NOTE — Telephone Encounter (Signed)
 Medical Buy and Bill  Prior Authorization initiated for PROLIA  via Prompt[employer]  Prior Auth (EOC) ID:  [id number]     [email or web address]

## 2024-07-17 ENCOUNTER — Inpatient Hospital Stay: Attending: Oncology

## 2024-07-17 ENCOUNTER — Other Ambulatory Visit: Payer: Self-pay

## 2024-07-17 ENCOUNTER — Telehealth: Payer: Self-pay

## 2024-07-17 ENCOUNTER — Encounter: Payer: Self-pay | Admitting: Surgery

## 2024-07-17 ENCOUNTER — Encounter
Admission: RE | Admit: 2024-07-17 | Discharge: 2024-07-17 | Disposition: A | Discharge status: Home / Self Care | Source: Ambulatory Visit | Attending: Surgery | Admitting: Surgery

## 2024-07-17 DIAGNOSIS — Z803 Family history of malignant neoplasm of breast: Secondary | ICD-10-CM | POA: Insufficient documentation

## 2024-07-17 DIAGNOSIS — Z811 Family history of alcohol abuse and dependence: Secondary | ICD-10-CM | POA: Insufficient documentation

## 2024-07-17 DIAGNOSIS — I10 Essential (primary) hypertension: Secondary | ICD-10-CM | POA: Diagnosis not present

## 2024-07-17 DIAGNOSIS — M810 Age-related osteoporosis without current pathological fracture: Secondary | ICD-10-CM | POA: Diagnosis not present

## 2024-07-17 DIAGNOSIS — Z83438 Family history of other disorder of lipoprotein metabolism and other lipidemia: Secondary | ICD-10-CM | POA: Insufficient documentation

## 2024-07-17 DIAGNOSIS — Z85828 Personal history of other malignant neoplasm of skin: Secondary | ICD-10-CM | POA: Diagnosis not present

## 2024-07-17 DIAGNOSIS — D509 Iron deficiency anemia, unspecified: Secondary | ICD-10-CM | POA: Insufficient documentation

## 2024-07-17 DIAGNOSIS — Z820 Family history of epilepsy and other diseases of the nervous system: Secondary | ICD-10-CM | POA: Insufficient documentation

## 2024-07-17 DIAGNOSIS — Z22322 Carrier or suspected carrier of Methicillin resistant Staphylococcus aureus: Secondary | ICD-10-CM | POA: Diagnosis not present

## 2024-07-17 DIAGNOSIS — R5383 Other fatigue: Secondary | ICD-10-CM | POA: Diagnosis not present

## 2024-07-17 DIAGNOSIS — Z91040 Latex allergy status: Secondary | ICD-10-CM | POA: Diagnosis not present

## 2024-07-17 DIAGNOSIS — Z8249 Family history of ischemic heart disease and other diseases of the circulatory system: Secondary | ICD-10-CM | POA: Insufficient documentation

## 2024-07-17 DIAGNOSIS — Z813 Family history of other psychoactive substance abuse and dependence: Secondary | ICD-10-CM | POA: Insufficient documentation

## 2024-07-17 DIAGNOSIS — Z79899 Other long term (current) drug therapy: Secondary | ICD-10-CM | POA: Insufficient documentation

## 2024-07-17 DIAGNOSIS — Z807 Family history of other malignant neoplasms of lymphoid, hematopoietic and related tissues: Secondary | ICD-10-CM | POA: Insufficient documentation

## 2024-07-17 DIAGNOSIS — Z800 Family history of malignant neoplasm of digestive organs: Secondary | ICD-10-CM | POA: Diagnosis not present

## 2024-07-17 DIAGNOSIS — I4891 Unspecified atrial fibrillation: Secondary | ICD-10-CM | POA: Insufficient documentation

## 2024-07-17 DIAGNOSIS — Z87410 Personal history of cervical dysplasia: Secondary | ICD-10-CM | POA: Insufficient documentation

## 2024-07-17 DIAGNOSIS — Z9089 Acquired absence of other organs: Secondary | ICD-10-CM | POA: Insufficient documentation

## 2024-07-17 DIAGNOSIS — Z91041 Radiographic dye allergy status: Secondary | ICD-10-CM | POA: Diagnosis not present

## 2024-07-17 DIAGNOSIS — Z9049 Acquired absence of other specified parts of digestive tract: Secondary | ICD-10-CM | POA: Diagnosis not present

## 2024-07-17 DIAGNOSIS — Z806 Family history of leukemia: Secondary | ICD-10-CM | POA: Diagnosis not present

## 2024-07-17 DIAGNOSIS — Z87442 Personal history of urinary calculi: Secondary | ICD-10-CM | POA: Insufficient documentation

## 2024-07-17 DIAGNOSIS — Z7901 Long term (current) use of anticoagulants: Secondary | ICD-10-CM | POA: Insufficient documentation

## 2024-07-17 DIAGNOSIS — Z886 Allergy status to analgesic agent status: Secondary | ICD-10-CM | POA: Diagnosis not present

## 2024-07-17 DIAGNOSIS — Z8349 Family history of other endocrine, nutritional and metabolic diseases: Secondary | ICD-10-CM | POA: Insufficient documentation

## 2024-07-17 DIAGNOSIS — Z9884 Bariatric surgery status: Secondary | ICD-10-CM | POA: Diagnosis not present

## 2024-07-17 LAB — IRON AND TIBC
Iron: 55 ug/dL (ref 28–170)
Saturation Ratios: 15 % (ref 10.4–31.8)
TIBC: 364 ug/dL (ref 250–450)
UIBC: 309 ug/dL

## 2024-07-17 LAB — CBC WITH DIFFERENTIAL/PLATELET
Abs Immature Granulocytes: 0.01 K/uL (ref 0.00–0.07)
Basophils Absolute: 0 K/uL (ref 0.0–0.1)
Basophils Relative: 0 %
Eosinophils Absolute: 0.3 K/uL (ref 0.0–0.5)
Eosinophils Relative: 6 %
HCT: 38.1 % (ref 36.0–46.0)
Hemoglobin: 12.2 g/dL (ref 12.0–15.0)
Immature Granulocytes: 0 %
Lymphocytes Relative: 43 %
Lymphs Abs: 1.9 K/uL (ref 0.7–4.0)
MCH: 28.2 pg (ref 26.0–34.0)
MCHC: 32 g/dL (ref 30.0–36.0)
MCV: 88 fL (ref 80.0–100.0)
Monocytes Absolute: 0.4 K/uL (ref 0.1–1.0)
Monocytes Relative: 8 %
Neutro Abs: 1.9 K/uL (ref 1.7–7.7)
Neutrophils Relative %: 43 %
Platelets: 187 K/uL (ref 150–400)
RBC: 4.33 MIL/uL (ref 3.87–5.11)
RDW: 14.6 % (ref 11.5–15.5)
WBC: 4.5 K/uL (ref 4.0–10.5)
nRBC: 0 % (ref 0.0–0.2)

## 2024-07-17 LAB — RETIC PANEL
Immature Retic Fract: 9.5 % (ref 2.3–15.9)
RBC.: 4.28 MIL/uL (ref 3.87–5.11)
Retic Count, Absolute: 43.2 10*3/uL (ref 19.0–186.0)
Retic Ct Pct: 1 % (ref 0.4–3.1)
Reticulocyte Hemoglobin: 33.4 pg

## 2024-07-17 LAB — FERRITIN: Ferritin: 29 ng/mL (ref 11–307)

## 2024-07-17 NOTE — Patient Instructions (Addendum)
 Your procedure is scheduled on:  [facility] SEPTEMBER 10  Report to the Registration Desk on the 1st floor of the [facility] To find out your arrival time, please call [phone] between 1PM - 3PM on:   TUESDAY SEPTEMBER 9  If your arrival time is 6:00 am, do not arrive before that time as the [facility] entrance doors do not open until 6:00 am.  REMEMBER: Instructions that are not followed completely may result in serious medical risk, up to and including death; or upon the discretion of your surgeon and anesthesiologist your surgery may need to be rescheduled.  Do not eat food after midnight the night before surgery.  No gum chewing or hard candies.   One week prior to surgery: Stop Anti-inflammatories (NSAIDS) such as Advil, Aleve, Ibuprofen, Motrin, Naproxen, Naprosyn and Aspirin based products such as Excedrin, Goody's Powder, BC Powder. Stop ANY OVER THE COUNTER supplements until after surgery.  You may however, continue to take Tylenol  if needed for pain up until the day of surgery.  **Follow recommendations regarding stopping blood thinners.** apixaban  (ELIQUIS ) has not taken in over a month   Continue taking all of your other prescription medications up until the day of surgery.  ON THE DAY OF SURGERY ONLY TAKE THESE MEDICATIONS WITH SIPS OF WATER:  ursodiol  (ACTIGALL )   No Alcohol for 24 hours before or after surgery.  Do not use any recreational drugs for at least a week (preferably 2 weeks) before your surgery.  Please be advised that the combination of cocaine and anesthesia may have negative outcomes, up to and including death. If you test positive for cocaine, your surgery will be cancelled.  On the morning of surgery brush your teeth with toothpaste and water, you may rinse your mouth with mouthwash if you wish. Do not swallow any toothpaste or mouthwash.  Use CHG Soap as directed on instruction sheet.  Do not wear jewelry, make-up, hairpins, clips or  nail polish.  For welded (permanent) jewelry: bracelets, anklets, waist bands, etc.  Please have this removed prior to surgery.  If it is not removed, there is a chance that hospital personnel will need to cut it off on the day of surgery.  Do not wear lotions, powders, or perfumes.   Do not shave body hair from the neck down 48 hours before surgery.  Contact lenses, hearing aids and dentures may not be worn into surgery.  Do not bring valuables to the hospital. [facility] is not responsible for any missing/lost belongings or valuables.   Wear comfortable clothing (specific to your surgery type) to the hospital.  After surgery, you can help prevent lung complications by doing breathing exercises.  Take deep breaths and cough every 1-2 hours.   If you are being discharged the day of surgery, you will not be allowed to drive home. You will need a responsible individual to drive you home and stay with you for 24 hours after surgery.   If you are taking public transportation, you will need to have a responsible individual with you.  Please call the Pre-admissions Testing Dept. at [phone] if you have any questions about these instructions.  Surgery Visitation Policy:  Patients having surgery or a procedure may have two visitors.  Children under the age of 8 must have an adult with them who is not the patient.   Merchandiser, retail to address health-related social needs:  [email or web address]

## 2024-07-17 NOTE — Telephone Encounter (Signed)
-----   Message from Dorise CHARLENA Pereyra sent at 07/17/2024  1:36 PM EDT ----- Regarding: Request for pre-operative cardiac clearance Request for pre-operative cardiac clearance:  1. What type of surgery is being performed?  REMOVAL, FOREIGN BODY, ABDOMEN; INCISION AND DRAINAGE, ABSCESS; LAPAROTOMY, EXPLORATORY; REMOVAL, CONDYLOMA   2. When is this surgery scheduled?  07/19/2024  3. Type of clearance being requested (medical, pharmacy, both)? MEDICAL   4. Are there any medications that need to be held prior to surgery? Has been prescribed apixaban , however she has not taken it in over a month citing that she ran out and has not had the energy to go pick up more. I will start it back after surgery.   5. Practice name and name of physician performing surgery?  Performing surgeon: Dr. Honor Leghorn, MD Requesting clearance: Dorise Pereyra, FNP-C    6. Anesthesia type (none, local, MAC, general)? GENERAL  7. What is the office phone and fax number?   Phone: [phone] Fax: [phone] (In efforts to conserve resources (paper), return FAX not required. I will follow up in [facility]).  ATTENTION: Unable to create telephone message as per your standard workflow. Directed by [facility] providers to send requests for cardiac clearance to this pool for appropriate distribution to provider covering pre-operative clearances.   Dorise Pereyra, MSN, APRN, FNP-C, CEN [facility]  Peri-operative Services Nurse Practitioner Phone: [phone] 07/17/24 1:36 PM

## 2024-07-17 NOTE — Telephone Encounter (Signed)
   Pre-operative Risk Assessment    Patient Name: [name]  DOB: 12/08/1965 MRN: [id number]   Date of last office visit: 03/20/24 SHERI HAMMOCK, NP Date of next office visit: 07/24/24 SHERI HAMMOCK, NP   Request for Surgical Clearance    Procedure:  REMOVAL, FOREIGN BODY, ABDOMEN; INCISION AND DRAINAGE, ABSCESS; LAPAROTOMY, EXPLORATORY; REMOVAL, CONDYLOMA   Date of Surgery:  Clearance 07/19/24                                Surgeon:  Dr. Honor Leghorn  Surgeon's Group or Practice Name:  [facility]  Phone number:  [phone]  Fax number:  [phone] (In efforts to conserve resources (paper), return FAX not required. I will follow up in [facility]).    Type of Clearance Requested:   - Medical  - Pharmacy:  Hold Apixaban  (Eliquis ) PER REQUEST: Has been prescribed apixaban , however she has not taken it in over a month citing that she ran out and has not had the energy to go pick up more. I will start it back after surgery.    Type of Anesthesia:  General    Additional requests/questions:    Signed, Lucie DELENA Ku   07/17/2024, 1:46 PM      Elnor Dorise BRAVO, NP  P Cv Div Preop Callback Request for pre-operative cardiac clearance:   1. What type of surgery is being performed? REMOVAL, FOREIGN BODY, ABDOMEN; INCISION AND DRAINAGE, ABSCESS; LAPAROTOMY, EXPLORATORY; REMOVAL, CONDYLOMA  2. When is this surgery scheduled? 07/19/2024   3. Type of clearance being requested (medical, pharmacy, both)? MEDICAL   4. Are there any medications that need to be held prior to surgery? Has been prescribed apixaban , however she has not taken it in over a month citing that she ran out and has not had the energy to go pick up more. I will start it back after surgery.  5. Practice name and name of physician performing surgery? Performing surgeon: Dr. Honor Leghorn, MD Requesting clearance: Dorise Elnor, FNP-C     6. Anesthesia type (none, local, MAC,  general)? GENERAL  7. What is the office phone and fax number?   Phone: [phone] Fax: [phone] (In efforts to conserve resources (paper), return FAX not required. I will follow up in [facility]).  ATTENTION: Unable to create telephone message as per your standard workflow. Directed by [facility] providers to send requests for cardiac clearance to this pool for appropriate distribution to provider covering pre-operative clearances.  Dorise Elnor, MSN, APRN, FNP-C, CEN [facility] Peri-operative Services Nurse Practitioner Phone: [phone] 07/17/24 1:36 PM

## 2024-07-18 ENCOUNTER — Telehealth: Payer: Self-pay | Admitting: *Deleted

## 2024-07-18 ENCOUNTER — Ambulatory Visit: Payer: Self-pay | Admitting: Surgery

## 2024-07-18 ENCOUNTER — Ambulatory Visit: Attending: General Practice

## 2024-07-18 DIAGNOSIS — Z01810 Encounter for preprocedural cardiovascular examination: Secondary | ICD-10-CM | POA: Diagnosis not present

## 2024-07-18 NOTE — Telephone Encounter (Signed)
 Per Josefa Beauvais, FNP to add pt on today for tele preop appt as procedure is tomorrow. Med rec and consent are done.     Patient Consent for Virtual Visit        [name] has provided verbal consent on 07/18/2024 for a virtual visit (video or telephone).   CONSENT FOR VIRTUAL VISIT FOR:  [name]  By participating in this virtual visit I agree to the following:  I hereby voluntarily request, consent and authorize [facility] and its employed or contracted physicians, physician assistants, nurse practitioners or other licensed health care professionals (the Practitioner), to provide me with telemedicine health care services (the "Services) as deemed necessary by the treating Practitioner. I acknowledge and consent to receive the Services by the Practitioner via telemedicine. I understand that the telemedicine visit will involve communicating with the Practitioner through live audiovisual communication technology and the disclosure of certain medical information by electronic transmission. I acknowledge that I have been given the opportunity to request an in-person assessment or other available alternative prior to the telemedicine visit and am voluntarily participating in the telemedicine visit.  I understand that I have the right to withhold or withdraw my consent to the use of telemedicine in the course of my care at any time, without affecting my right to future care or treatment, and that the Practitioner or I may terminate the telemedicine visit at any time. I understand that I have the right to inspect all information obtained and/or recorded in the course of the telemedicine visit and may receive copies of available information for a reasonable fee.  I understand that some of the potential risks of receiving the Services via telemedicine include:  Delay or interruption in medical evaluation due to technological equipment failure or disruption; Information  transmitted may not be sufficient (e.g. poor resolution of images) to allow for appropriate medical decision making by the Practitioner; and/or  In rare instances, security protocols could fail, causing a breach of personal health information.  Furthermore, I acknowledge that it is my responsibility to provide information about my medical history, conditions and care that is complete and accurate to the best of my ability. I acknowledge that Practitioner's advice, recommendations, and/or decision may be based on factors not within their control, such as incomplete or inaccurate data provided by me or distortions of diagnostic images or specimens that may result from electronic transmissions. I understand that the practice of medicine is not an exact science and that Practitioner makes no warranties or guarantees regarding treatment outcomes. I acknowledge that a copy of this consent can be made available to me via my patient portal [facility] MyChart), or I can request a printed copy by calling the office of [facility].    I understand that my insurance will be billed for this visit.   I have read or had this consent read to me. I understand the contents of this consent, which adequately explains the benefits and risks of the Services being provided via telemedicine.  I have been provided ample opportunity to ask questions regarding this consent and the Services and have had my questions answered to my satisfaction. I give my informed consent for the services to be provided through the use of telemedicine in my medical care

## 2024-07-18 NOTE — Progress Notes (Signed)
 Perioperative / Anesthesia Services  Pre-Admission Testing Clinical Review / Pre-Operative Anesthesia Consult  Date: 07/18/24  PATIENT DEMOGRAPHICS: Name: [name] DOB: [birth year] MRN:   [id number]  Note: Available PAT nursing documentation and vital signs have been reviewed. Clinical nursing staff has updated patient's PMH/PSHx, current medication list, and drug allergies/intolerances to ensure complete and comprehensive history available to assist care teams in MDM as it pertains to the aforementioned surgical procedure and anticipated anesthetic course. Extensive review of available clinical information personally performed. [place] PMH and PSHx updated with any diagnoses/procedures that  may have been inadvertently omitted during his intake with the pre-admission testing department's nursing staff.  PLANNED SURGICAL PROCEDURE(S):    Case: [id number] Date/Time: 07/19/24 0715   Procedures:      REMOVAL, FOREIGN BODY, ABDOMEN     INCISION AND DRAINAGE, ABSCESS - Incision and drainage of abdominal wall abscess     LAPAROTOMY, EXPLORATORY - VIA PREVIOUS LAPAROTOMY INCISION     REMOVAL, CONDYLOMA   Anesthesia type: General   Diagnosis:      Abscess of abdominal wall [L02.211]     Foreign body of abdominal wall, initial encounter [S30.851A]   Pre-op diagnosis:      abscess of abdominal wall     foreign body of abdominal wall   Location: [facility] OR ROOM 07 / [facility]   Surgeons: Lane Shope, MD     CLINICAL DISCUSSION: [name] is a 58 y.o. female who is submitted for pre-surgical anesthesia review and clearance prior to her undergoing the above procedure. Patient has never been a smoker in the past. Pertinent PMH includes: PAF, diastolic dysfunction, bradycardia, HTN, HLD, T2DM, multinodular goiter, asthma, OSAH (resolved with weight loss), GERD (no daily Tx), hiatal hernia (s/p repair), primary biliary cirrhosis, s/p bariatric surgery  (biliopancreatic diversion with duodenal switch), abdominal wall seroma, IDA, nephrolithiasis.   Patient is followed by cardiology (Gollan, MD). She was last seen in the cardiology clinic on 03/20/2024; notes reviewed. At the time of her clinic visit, patient doing well overall from a cardiovascular perspective. Patient denied any chest pain, shortness of breath, PND, orthopnea, palpitations, significant peripheral edema, weakness, fatigue, vertiginous symptoms, or presyncope/syncope. Patient with a past medical history significant for cardiovascular diagnoses. Documented physical exam was grossly benign, providing no evidence of acute exacerbation and/or decompensation of the patient's known cardiovascular conditions.  Most recent TTE performed on 09/30/2023 revealed a normal left ventricular systolic function with an EF of 55-60%. There were no regional wall motion abnormalities. Left ventricular diastolic Doppler parameters consistent with abnormal relaxation (G1DD). Right ventricular size and function normal with a TAPSE measuring 2.3 cm  (normal range >/= 1.6 cm).  RVSP = 18.4 mmHg. aortic valve noted to be sclerotic.  There was no evidence of significant valvular regurgitation.  All transvalvular gradients were noted to be normal providing no evidence suggestive of valvular stenosis. Aorta normal in size with no evidence of ectasia or aneurysmal dilatation.  Patient with a newly diagnosed atrial fibrillation in 09/2023; CHA2DS2-VASc Score = 3 (sex, HTN, T2DM).  Rate and rhythm currently being maintained intrinsically without the need for pharmacological intervention.  Patient is chronically anticoagulated using standard dose apixaban ; wished to discuss discontinuing Patient reportedly had only been taking her anticoagulant once daily (prescribed BID); no evidence or reports of GI/GU related bleeding.  Blood pressure in the clinic was documented at 160/78 mmHg on prescribed CCB (amlodipine ) monotherapy.   Patient was not taking any type of  lipid-lowering therapies for her HLD diagnosis and ASCVD prevention.  Patient formally had an OSAH diagnosis, however following significant resulting from her biliopancreatic diversion with duodenal switch procedure, patient no longer requires the use of nocturnal PAP therapy. T2DM well-controlled with diet lifestyle modification alone following the aforementioned surgical procedure and subsequent weight loss. Patient is able to complete all of her ADL/IADLs without cardiovascular limitation.  Per the DAS[facility], patient is able to achieve at least 4 METS of physical activity without experiencing any significant degree of angina/anginal equivalent symptoms.  Patient was encouraged to restart medications as prescribed.  No changes were made to her medication regimen during her visit with cardiology.  Patient scheduled to follow-up with outpatient cardiology in 4 months or sooner if needed.   [name] is scheduled for an elective REMOVAL, FOREIGN BODY, ABDOMEN; INCISION AND DRAINAGE, ABSCESS; LAPAROTOMY, EXPLORATORY; REMOVAL, CONDYLOMA on 07/19/2024 with Dr. Honor Leghorn, MD.  Given patient's past medical history significant for cardiovascular diagnoses, presurgical cardiac clearance was sought by the PAT team. Per cardiology, her RCRI is very low risk, 0.4% risk of major cardiac event. She is able to complete greater than 4 METS of physical activity. Based ACC/AHA guidelines, the patient's past medical history, and the amount of time since her last clinic visit, this patient would be at an overall ACCEPTABLE risk for the planned procedure without further cardiovascular testing or intervention at this time.   Again, this patient is has been prescribed standard dose DOAC medication.  During her PAT interview, it was discovered that patient had not been taking this medication.  She stated that it has been over a month since she had taken the medication and she did not  have the energy to go get more.  In review of the notes from cardiology, it appears as if patient wishes to discontinue DOAC, however cardiology recommended continuing.  Note sent to cardiology to make them aware.  Patient denies previous perioperative complications with anesthesia in the past. In review her EMR, it is noted that patient underwent a general anesthetic course here at [facility] ([facility]) in 03/2024 without documented complications.   MOST RECENT VITAL SIGNS:    07/17/2024   11:18 AM 06/29/2024    9:03 AM 06/22/2024    7:27 AM  Vitals with BMI  Height 5' 6.5 5' 6.5 5' 6.5  Weight 175 lbs 175 lbs 10 oz 177 lbs 6 oz  BMI 27.83 27.92 28.21  Systolic  162 136  Diastolic  82 78  Pulse  80 50   PROVIDERS/SPECIALISTS: NOTE: Primary physician provider listed below. Patient may have been seen by APP or partner within same practice.   PROVIDER ROLE / SPECIALTY LAST SHERLEAN Leghorn Honor, MD  General Surgery (Surgeon) 06/29/2024   Dineen Rollene MATSU, FNP Primary Care Provider [phone]  Perla Lye, MD Cardiology 03/20/2024; update call with APP 07/18/2024  Babara Call, MD Medical Oncology/Hematology 04/12/2024   ALLERGIES: Allergies  Allergen Reactions   Ivp Dye [Iodinated Contrast Media]     Patient 'codes' with IV contrast   Latex Hives, Shortness Of Breath and Cough   Tolmetin Hives   Gallium Nitrate Hives    AND Gadolinium Derivatives    Insulin  Detemir Hives and Swelling    Lump at injection site   Nsaids Hives    Per pt stated has to take ibuprofen with a antihistimine   Gadolinium Derivatives Hives    Pt c/o itching after contrast  injection 03/15/19 @ 8:45am. [facility] Per Dr. Luverne document this as allergic reaction.    CURRENT HOME MEDICATIONS: No current facility-administered medications for this encounter.    amLODipine  (NORVASC ) 5 MG tablet   apixaban  (ELIQUIS ) 5 MG TABS tablet   ursodiol  (ACTIGALL ) 250 MG tablet    HISTORY: Past Medical History:  Diagnosis Date   Abdominal wall seroma    Asthma due to seasonal allergies    Bradycardia    Diastolic dysfunction    Dysplasia of cervix, low grade (CIN 1)    GERD (gastroesophageal reflux disease)    Hiatal hernia    a.) s/p repair concurrently with biliopancreatic diversion with duodenal switch procedure in 2017   History of diabetes mellitus, type II    a.) resolved after bariatric surgery done in 2017   History of hiatal hernia s/p repair    History of kidney stones    History of obstructive sleep apnea    a.) resolved after bariatric surgery done in 2017; has had repeat PSG testing that was negative since surgery   HTN (hypertension)    Hyperlipidemia    Iron  deficiency anemia    MRSA carrier    Multinodular goiter    NAFL (nonalcoholic fatty liver)    Nephrolithiasis    New onset atrial fibrillation (HCC) 09/29/2023   a.) Dx'd 09/2023; b.) CHA2DS2VASc = 3 (sex, HTN,  T2DM) as of 01/24/2024; b.) rate/rhythm maintained intrinsically without need for pharmacological intervention; chronically anticoagulated with apixaban    On apixaban  therapy    Osteoporosis    Primary biliary cirrhosis (HCC)    Skin cancer of nose    Status post biliopancreatic diversion with duodenal switch 08/03/2016   Wears glasses    Past Surgical History:  Procedure Laterality Date   APPLICATION OF WOUND VAC N/A 01/26/2024   Procedure: APPLICATION, WOUND VAC;  Surgeon: Lane Shope, MD;  Location: [facility] ORS;  Service: General;  Laterality: N/A;   AUGMENTATION MAMMAPLASTY Bilateral 07/2021   Implants and lift   AUGMENTATION MAMMAPLASTY Bilateral 07/05/2022   Dr V[place]a, replaced implants   BARIATRIC SURGERY  08/03/2016   LAPAROSCOPY DUODENAL SWITCH AND HIATAL HERNIA REPAIR; Dr. Wolm @[facility] , [place]   BRACHIOPLASTY Bilateral    BREAST BIOPSY Right 2017   FIBROCYSTIC CHANGES WITH CALCIFICATIONS   CESAREAN SECTION     x 2   COLONOSCOPY WITH PROPOFOL  N/A  09/11/2019   Procedure: COLONOSCOPY WITH PROPOFOL ;  Surgeon: Therisa Bi, MD;  Location: [facility] ENDOSCOPY;  Service: Gastroenterology;  Laterality: N/A;   CYSTOSCOPY WITH RETROGRADE PYELOGRAM, URETEROSCOPY AND STENT PLACEMENT Right 02/06/2022   CYSTOSCOPY WITH STENT PLACEMENT Right 09/10/2017   Procedure: CYSTOSCOPY WITH STENT PLACEMENT;  Surgeon: Twylla Glendia BROCKS, MD;  Location: [facility] ORS;  Service: Urology;  Laterality: Right;   CYSTOSCOPY/URETEROSCOPY/HOLMIUM LASER/STENT PLACEMENT Right 04/24/2019   Procedure: CYSTOSCOPY/URETEROSCOPY/STENT PLACEMENT/ RIGHT RETROGRADE;  Surgeon: Nieves Cough, MD;  Location: [facility];  Service: Urology;  Laterality: Right;   DX LAPAROSCOPY/  EGD/  HIATAL HERNIA REPAIR CLOSURE OF INTERNAL MENSENTRY DEFECT  10/28/2017   @[facility] , [place]   EXTRACORPOREAL SHOCK WAVE LITHOTRIPSY N/A 01/30/2019   Procedure: EXTRACORPOREAL SHOCK WAVE LITHOTRIPSY (ESWL) LEFT/ POSSIBLE RIGHT;  Surgeon: Nieves Cough, MD;  Location: [facility] ORS;  Service: Urology;  Laterality: N/A;   EXTRACORPOREAL SHOCK WAVE LITHOTRIPSY Right 03/20/2019   Procedure: EXTRACORPOREAL SHOCK WAVE LITHOTRIPSY (ESWL);  Surgeon: Matilda Senior, MD;  Location: [facility];  Service: Urology;  Laterality: Right;   EXTRACORPOREAL SHOCK WAVE LITHOTRIPSY Left 10/30/2021  Procedure: EXTRACORPOREAL SHOCK WAVE LITHOTRIPSY (ESWL);  Surgeon: Cam Morene ORN, MD;  Location: [facility];  Service: Urology;  Laterality: Left;   LAPAROSCOPIC CHOLECYSTECTOMY  10/15/2016   @[facility]  , [place]   LAPAROTOMY N/A 01/26/2024   Procedure: LAPAROTOMY, EXPLORATORY of abdominal wall wounds, for removal of foreign body; ultrasound-guided aspiratio of seroma;  Surgeon: Lane Shope, MD;  Location: [facility] ORS;  Service: General;  Laterality: N/A;   LAPAROTOMY N/A 03/29/2024   Procedure: LAPAROTOMY, EXPLORATORY;  Surgeon: Lane Shope, MD;  Location: [facility] ORS;  Service: General;  Laterality: N/A;   Abdominal wall, drain/resection seroma cavity   LIPOSUCTION     PANNICULECTOMY     2022   TONSILLECTOMY AND ADENOIDECTOMY  child   URETEROSCOPY WITH HOLMIUM LASER LITHOTRIPSY Right 09/10/2017   Procedure: URETEROSCOPY WITH HOLMIUM LASER LITHOTRIPSY;  Surgeon: Twylla Glendia BROCKS, MD;  Location: [facility] ORS;  Service: Urology;  Laterality: Right;   Family History  Problem Relation Age of Onset   Seizures Mother    Alcohol abuse Mother    Hyperlipidemia Mother    Hypertension Mother    Lymphoma Mother        primary site : brain   Leukemia Mother    Parkinsonism Father    Hyperlipidemia Father    Hypertension Father    Parkinson's disease Father    Drug abuse Sister    Cancer Maternal Grandmother        breast cancer   Breast cancer Maternal Grandmother 42   Liver cancer Paternal Grandfather    Kidney cancer Neg Hx    Prostate cancer Neg Hx    Social History   Tobacco Use   Smoking status: Never    Passive exposure: Never   Smokeless tobacco: Never  Substance Use Topics   Alcohol use: No    Alcohol/week: 0.0 standard drinks of alcohol   LABS:  Appointment on 07/17/2024  Component Date Value Ref Range Status   Retic Ct Pct 07/17/2024 1.0  0.4 - 3.1 % Final   RBC. 07/17/2024 4.28  3.87 - 5.11 MIL/uL Final   Retic Count, Absolute 07/17/2024 43.2  19.0 - 186.0 K/uL Final   Immature Retic Fract 07/17/2024 9.5  2.3 - 15.9 % Final   Reticulocyte Hemoglobin 07/17/2024 33.4  >27.9 pg Final   Comment:        Given the high negative predictive value of a RET-He result > 32 pg iron  deficiency is essentially excluded. If this patient is anemic other etiologies should be considered. Performed at [facility], [address] [place], [place] [address]    WBC 07/17/2024 4.5  4.0 - 10.5 K/uL Final   RBC 07/17/2024 4.33  3.87 - 5.11 MIL/uL Final   Hemoglobin 07/17/2024 12.2  12.0 - 15.0 g/dL Final   HCT 90/91/7974 38.1  36.0 - 46.0 % Final   MCV 07/17/2024 88.0  80.0 - 100.0  fL Final   MCH 07/17/2024 28.2  26.0 - 34.0 pg Final   MCHC 07/17/2024 32.0  30.0 - 36.0 g/dL Final   RDW 90/91/7974 14.6  11.5 - 15.5 % Final   Platelets 07/17/2024 187  150 - 400 K/uL Final   nRBC 07/17/2024 0.0  0.0 - 0.2 % Final   Neutrophils Relative % 07/17/2024 43  % Final   Neutro Abs 07/17/2024 1.9  1.7 - 7.7 K/uL Final   Lymphocytes Relative 07/17/2024 43  % Final   Lymphs Abs 07/17/2024 1.9  0.7 - 4.0 K/uL Final  Monocytes Relative 07/17/2024 8  % Final   Monocytes Absolute 07/17/2024 0.4  0.1 - 1.0 K/uL Final   Eosinophils Relative 07/17/2024 6  % Final   Eosinophils Absolute 07/17/2024 0.3  0.0 - 0.5 K/uL Final   Basophils Relative 07/17/2024 0  % Final   Basophils Absolute 07/17/2024 0.0  0.0 - 0.1 K/uL Final   Immature Granulocytes 07/17/2024 0  % Final   Abs Immature Granulocytes 07/17/2024 0.01  0.00 - 0.07 K/uL Final   Performed at [facility], [address] [place], [place] [address]   Iron  07/17/2024 55  28 - 170 ug/dL Final   TIBC 90/91/7974 364  250 - 450 ug/dL Final   Saturation Ratios 07/17/2024 15  10.4 - 31.8 % Final   UIBC 07/17/2024 309  ug/dL Final   Performed at [facility], [address] [place], [place] [address]   Ferritin 07/17/2024 29  11 - 307 ng/mL Final   Performed at [facility], [address] [place], [place] [address]  Orders Only on 06/21/2024  Component Date Value Ref Range Status   Glucose 06/21/2024 74  70 - 99 mg/dL Final   BUN 91/86/7974 13  6 - 24 mg/dL Final   Creatinine, Ser 06/21/2024 0.85  0.57 - 1.00 mg/dL Final   eGFR 91/86/7974 80  >59 mL/min/1.73 Final   BUN/Creatinine Ratio 06/21/2024 15  9 - 23 Final   Sodium 06/21/2024 144  134 - 144 mmol/L Final   Potassium 06/21/2024 4.3  3.5 - 5.2 mmol/L Final   Chloride 06/21/2024 109 (H)  96 - 106 mmol/L Final   CO2 06/21/2024 21  20 - 29 mmol/L Final   Calcium  06/21/2024 8.7  8.7 - 10.2 mg/dL Final    ECG: Date: 94/87/7974  Time ECG  obtained: 0806 AM Rate: 52 bpm Rhythm: sinus bradycardia Axis (leads I and aVF): normal Intervals: PR 154 ms. QRS 94 ms. QTc 414 ms. ST segment and T wave changes: No evidence of acute T wave abnormalities or significant ST segment elevation or depression.  Evidence of a possible, age undetermined, prior infarct:  No Comparison: Similar to previous tracing obtained on 12/21/2023   IMAGING / PROCEDURES: CT ABDOMEN PELVIS WO CONTRAST performed on 01/10/2024 11.1 x 1.5 x 4.4 cm ill-defined fluid collection in the deep subcutaneous fat of the lower right anterior abdominal wall, just superficial to the rectus fascia. There is an associated midline component in the suprapubic region that measures on the order of 1.7 x 3.0 x 2.9 cm. These collections likely communicate with each other. Findings could reflect a postoperative seroma. Abscess would also be a consideration although assessment is limited by lack of intravenous contrast material. Stranding in the surrounding subcutaneous fat raises the question of cellulitis. Fluid-filled mildly distended small bowel in the pelvis measuring up to 2.9 cm diameter. Assessment for wall thickening is not possible due to lack of oral and intravenous contrast material. No substantial perienteric edema. Imaging features are nonspecific and may be related to ileus. Bilateral nonobstructing renal stones.  TRANSTHORACIC ECHOCARDIOGRAM performed on 09/30/2023 Left ventricular ejection fraction, by estimation, is 55 to 60%. The left ventricle has normal function. The left ventricle has no regional wall motion abnormalities. Left ventricular diastolic parameters are consistent with Grade I diastolic dysfunction (impaired relaxation).  Right ventricular systolic function is normal. The right ventricular size is normal. There is normal pulmonary artery systolic pressure. The estimated right ventricular systolic pressure is 18.4 mmHg.  The mitral  valve is normal in  structure. No evidence of mitral valve regurgitation. No evidence of mitral stenosis.  The aortic valve is tricuspid. Aortic valve regurgitation is not visualized. Aortic valve sclerosis is present, with no evidence of aortic valve stenosis.  The inferior vena cava is normal in size with greater than 50% respiratory variability, suggesting right atrial pressure of 3 mmHg.     IMPRESSION AND PLAN: Spring San has been referred for pre-anesthesia review and clearance prior to her undergoing the planned anesthetic and procedural courses. Available labs, pertinent testing, and imaging results were personally reviewed by me in preparation for upcoming operative/procedural course. [facility] medical record has been updated following extensive record review and patient interview with PAT staff.   This patient has been appropriately cleared by cardiology with an overall ACCEPTABLE risk of patient experiencing significant perioperative cardiovascular complications. Based on clinical review performed today (07/18/24), barring any significant acute changes in the patient's overall condition, it is anticipated that she will be able to proceed with the planned surgical intervention. Any acute changes in clinical condition may necessitate her procedure being postponed and/or cancelled. Patient will meet with anesthesia team (MD and/or CRNA) on the day of her procedure for preoperative evaluation/assessment. Questions regarding anesthetic course will be fielded at that time.   Pre-surgical instructions were reviewed with the patient during his PAT appointment, and questions were fielded to satisfaction by PAT clinical staff. She has been instructed on which medications that she will need to hold prior to surgery, as well as the ones that have been deemed safe/appropriate to take on the day of her procedure. As part of the general education provided by PAT, patient made aware both verbally and in writing, that  she would need to abstain from the use of any illegal substances during her perioperative course. She was advised that failure to follow the provided instructions could necessitate case cancellation or result in serious perioperative complications up to and including death. Patient encouraged to contact PAT and/or her surgeon's office to discuss any questions or concerns that may arise prior to surgery; verbalized understanding.   Dorise Pereyra, MSN, APRN, FNP-C, CEN [facility]  Perioperative Services Nurse Practitioner Phone: [phone] Fax: [phone] 07/18/24 4:51 PM  NOTE: This note has been prepared using Dragon dictation software. Despite my best ability to proofread, there is always the potential that unintentional transcriptional errors may still occur from this process.

## 2024-07-18 NOTE — Progress Notes (Signed)
 Virtual Visit via Telephone Note   Because of [name] co-morbid illnesses, she is at least at moderate risk for complications without adequate follow up.  This format is felt to be most appropriate for this patient at this time.  Due to technical limitations with video connection (technology), today's appointment will be conducted as an audio only telehealth visit, and [name] verbally agreed to proceed in this manner.   All issues noted in this document were discussed and addressed.  No physical exam could be performed with this format.  Evaluation Performed:  Preoperative cardiovascular risk assessment _____________   Date:  07/18/2024   Patient ID:  [name], DOB 09-Apr-1966, MRN [id number] Patient Location:  Home Provider location:   Office  Primary Care Provider:  Dineen Rollene MATSU, FNP Primary Cardiologist:  Evalene Lunger, MD  Chief Complaint / Patient Profile   58 y.o. y/o female with a h/o hypertension, paroxysmal atrial fibrillation with RVR who is pending removal, foreign body, abdomen, incision and drainage, abscess, laparotomy, exploratory, removal condyloma and presents today for telephonic preoperative cardiovascular risk assessment.  History of Present Illness    [name] is a 58 y.o. female who presents via audio/video conferencing for a telehealth visit today.  Pt was last seen in cardiology clinic on 03/20/2024 by Gerard, NP.  At that time Teea Ducey was doing well .  The patient is now pending procedure as outlined above. Since her last visit, she continues to remain stable from a cardiac standpoint.  Today she denies chest pain, shortness of breath, lower extremity edema, fatigue, palpitations, melena, hematuria, hemoptysis, diaphoresis, weakness, presyncope, syncope, orthopnea, and PND.   Past Medical History    Past Medical History:  Diagnosis Date   Abdominal wall seroma    Asthma due to  seasonal allergies    Bradycardia    Diastolic dysfunction    Dysplasia of cervix, low grade (CIN 1)    GERD (gastroesophageal reflux disease)    Hiatal hernia    a.) s/p repair concurrently with biliopancreatic diversion with duodenal switch procedure in 2017   History of diabetes mellitus, type II    a.) resolved after bariatric surgery done in 2017   History of hiatal hernia s/p repair    History of kidney stones    History of obstructive sleep apnea    a.) resolved after bariatric surgery done in 2017; has had repeat PSG testing that was negative since surgery   HTN (hypertension)    Hyperlipidemia    Iron  deficiency anemia    MRSA carrier    Multinodular goiter    NAFL (nonalcoholic fatty liver)    Nephrolithiasis    New onset atrial fibrillation (HCC) 09/29/2023   a.) Dx'd 09/2023; b.) CHA2DS2VASc = 3 (sex, HTN,  T2DM) as of 01/24/2024; b.) rate/rhythm maintained intrinsically without need for pharmacological intervention; chronically anticoagulated with apixaban    On apixaban  therapy    Osteoporosis    Primary biliary cirrhosis (HCC)    Skin cancer of nose    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 Wolm @[facility] , [place]   BRACHIOPLASTY Bilateral    BREAST BIOPSY Right 2017   FIBROCYSTIC CHANGES WITH CALCIFICATIONS   CESAREAN SECTION     x 2   COLONOSCOPY WITH PROPOFOL  N/A 09/11/2019   Procedure: COLONOSCOPY WITH PROPOFOL ;  Surgeon: Therisa Bi, MD;  Location: [facility]  ENDOSCOPY;  Service: Gastroenterology;  Laterality: N/A;   CYSTOSCOPY WITH RETROGRADE PYELOGRAM, URETEROSCOPY AND STENT PLACEMENT Right 02/06/2022   CYSTOSCOPY WITH STENT PLACEMENT Right 09/10/2017   Procedure: CYSTOSCOPY WITH STENT PLACEMENT;  Surgeon: Twylla Glendia BROCKS, MD;  Location: [facility];  Service: Urology;  Laterality: Right;   CYSTOSCOPY/URETEROSCOPY/HOLMIUM LASER/STENT PLACEMENT Right 04/24/2019   Procedure: CYSTOSCOPY/URETEROSCOPY/STENT PLACEMENT/ RIGHT RETROGRADE;  Surgeon: Nieves Cough, MD;  Location: [facility];  Service: Urology;  Laterality: Right;   DX LAPAROSCOPY/  EGD/  HIATAL HERNIA REPAIR CLOSURE OF INTERNAL MENSENTRY DEFECT  10/28/2017   @[facility] , [place]   EXTRACORPOREAL SHOCK WAVE LITHOTRIPSY N/A 01/30/2019   Procedure: EXTRACORPOREAL SHOCK WAVE LITHOTRIPSY (ESWL) LEFT/ POSSIBLE RIGHT;  Surgeon: Nieves Cough, MD;  Location: [facility];  Service: Urology;  Laterality: N/A;   EXTRACORPOREAL SHOCK WAVE LITHOTRIPSY Right 03/20/2019   Procedure: EXTRACORPOREAL SHOCK WAVE LITHOTRIPSY (ESWL);  Surgeon: Matilda Senior, MD;  Location: [facility];  Service: Urology;  Laterality: Right;   EXTRACORPOREAL SHOCK WAVE LITHOTRIPSY Left 10/30/2021   Procedure: EXTRACORPOREAL SHOCK WAVE LITHOTRIPSY (ESWL);  Surgeon: Cam Morene ORN, MD;  Location: [facility];  Service: Urology;  Laterality: Left;   LAPAROSCOPIC CHOLECYSTECTOMY  10/15/2016   @[facility]  , [place]   LAPAROTOMY N/A 01/26/2024   Procedure: LAPAROTOMY, EXPLORATORY of abdominal wall wounds, for removal of foreign body; ultrasound-guided aspiratio of seroma;  Surgeon: Lane Shope, MD;  Location: [facility];  Service: General;  Laterality: N/A;   LAPAROTOMY N/A 03/29/2024   Procedure: LAPAROTOMY, EXPLORATORY;  Surgeon: Lane Shope, MD;  Location: [facility];  Service: General;  Laterality: N/A;  Abdominal wall, drain/resection seroma cavity   LIPOSUCTION     PANNICULECTOMY     2022    TONSILLECTOMY AND ADENOIDECTOMY  child   URETEROSCOPY WITH HOLMIUM LASER LITHOTRIPSY Right 09/10/2017   Procedure: URETEROSCOPY WITH HOLMIUM LASER LITHOTRIPSY;  Surgeon: Twylla Glendia BROCKS, MD;  Location: [facility];  Service: Urology;  Laterality: Right;    Allergies  Allergies  Allergen Reactions   Ivp Dye [Iodinated Contrast Media]     Patient 'codes' with IV contrast   Latex Hives, Shortness Of Breath and Cough   Tolmetin Hives   Gallium Nitrate Hives    AND Gadolinium Derivatives    Insulin  Detemir Hives and Swelling    Lump at injection site   Nsaids Hives    Per pt stated has to take ibuprofen with a antihistimine   Gadolinium Derivatives Hives    Pt c/o itching after contrast injection 03/15/19 @ 8:45am. [facility] Per Dr. Luverne document this as allergic reaction.     Home Medications    Prior to Admission medications   Medication Sig Start Date End Date Taking? Authorizing Provider  amLODipine  (NORVASC ) 5 MG tablet Take 1 tablet (5 mg total) by mouth as directed. Take 1 whole tablet (5 mg) in the morning AND Take half a tablet (2.5 mg) at bedtime. 03/20/24   Gerard Frederick, NP  apixaban  (ELIQUIS ) 5 MG TABS tablet Take 1 tablet (5 mg total) by mouth 2 (two) times daily. Patient not taking: Reported on 07/18/2024 12/21/23  Gerard Frederick, NP  ursodiol  (ACTIGALL ) 250 MG tablet Take 500-750 mg by mouth See admin instructions. 750 mg in the morning, 500 mg at bedtime 03/10/23   [provider]    Physical Exam    Vital Signs:  Nataley Bahri does not have vital signs available for review today.  Given telephonic nature of communication, physical exam is limited. AAOx3. NAD. Normal affect.  Speech and respirations are unlabored.  Accessory Clinical Findings    None  Assessment & Plan    1.  Preoperative Cardiovascular Risk Assessment:REMOVAL, FOREIGN BODY, ABDOMEN; INCISION AND DRAINAGE, ABSCESS; LAPAROTOMY, EXPLORATORY; REMOVAL, CONDYLOMA    Date of Surgery:   Clearance 07/19/24                                  Surgeon:  Dr. Honor Leghorn  Surgeon's Group or Practice Name:  [facility]  Phone number:  [phone]  Fax number:  [phone]      Primary Cardiologist: Evalene Lunger, MD  Chart reviewed as part of pre-operative protocol coverage. Given past medical history and time since last visit, based on ACC/AHA guidelines, Tomeko Dorothy Muilenburg would be at acceptable risk for the planned procedure without further cardiovascular testing.   Her RCRI is very low risk, 0.4% risk of major cardiac event.  She is able to complete greater than 4 METS of physical activity.  Patient was advised that if she develops new symptoms prior to surgery to contact our office to arrange a follow-up appointment.  He verbalized understanding.  Patient has not been taking Eliquis  for 1 month due to running out and not having the energy to pick up more. Surgeon will resume Eliquis  after surgery. Since surgery is tomorrow-continue to hold and resume as soon as safely possible after surgery.   I will route this recommendation to the requesting party via Epic fax function and remove from pre-op pool.       Time:   Today, I have spent  5  minutes with the patient with telehealth technology discussing medical history, symptoms, and management plan.  I spent 10 minutes reviewing patient's past cardiac history and cardiac medications.    Josefa CHRISTELLA Beauvais, NP  07/18/2024, 9:28 AM

## 2024-07-18 NOTE — Telephone Encounter (Signed)
 Per Josefa Beauvais, FNP to add pt on today for tele preop appt as procedure is tomorrow. Med rec and consent are done.

## 2024-07-18 NOTE — Telephone Encounter (Signed)
 Per clearance note, patient has not been taking Eliquis  for 1 month due to running out and not having the energy to pick up more. Surgeon will resume Eliquis  after surgery. Since surgery is tomorrow- continue to hold and resume as soon as safely possible after surgery.

## 2024-07-18 NOTE — Telephone Encounter (Signed)
   Name: [name]  DOB: 08-Mar-1966  MRN: [id number]  Primary Cardiologist: Evalene Lunger, MD   Preoperative team, please contact this patient and set up a phone call appointment for further preoperative risk assessment. Please obtain consent and complete medication review. Thank you for your help.  I confirm that guidance regarding antiplatelet and oral anticoagulation therapy has been completed and, if necessary, noted below.  Per pharmacy - Patient has not been taking Eliquis  for 1 month due to running out and not having the energy to pick up more.  Surgeon will resume Eliquis  after surgery.  Since surgery is tomorrow-continue to hold and resume as soon as safely possible after surgery.     I also confirmed the patient resides in the state of [place] . As per [facility] Medical Board telemedicine laws, the patient must reside in the state in which the provider is licensed.   Josefa CHRISTELLA Beauvais, NP 07/18/2024, 8:10 AM [facility]

## 2024-07-19 ENCOUNTER — Encounter: Payer: Self-pay | Admitting: Surgery

## 2024-07-19 ENCOUNTER — Ambulatory Visit
Admission: RE | Admit: 2024-07-19 | Discharge: 2024-07-19 | Disposition: A | Discharge status: Home / Self Care | Attending: Surgery | Admitting: Surgery

## 2024-07-19 ENCOUNTER — Inpatient Hospital Stay

## 2024-07-19 ENCOUNTER — Encounter
Admission: RE | Disposition: A | Discharge status: Home / Self Care | Payer: Self-pay | Source: Home / Self Care | Attending: Surgery

## 2024-07-19 ENCOUNTER — Other Ambulatory Visit: Payer: Self-pay

## 2024-07-19 ENCOUNTER — Telehealth: Payer: Self-pay | Admitting: Oncology

## 2024-07-19 ENCOUNTER — Ambulatory Visit: Payer: Self-pay | Admitting: Urgent Care

## 2024-07-19 ENCOUNTER — Inpatient Hospital Stay: Admitting: Oncology

## 2024-07-19 ENCOUNTER — Encounter: Payer: Self-pay | Admitting: Urgent Care

## 2024-07-19 DIAGNOSIS — T8149XD Infection following a procedure, other surgical site, subsequent encounter: Secondary | ICD-10-CM | POA: Diagnosis not present

## 2024-07-19 DIAGNOSIS — G4733 Obstructive sleep apnea (adult) (pediatric): Secondary | ICD-10-CM | POA: Diagnosis not present

## 2024-07-19 DIAGNOSIS — J45909 Unspecified asthma, uncomplicated: Secondary | ICD-10-CM | POA: Diagnosis not present

## 2024-07-19 DIAGNOSIS — Z9884 Bariatric surgery status: Secondary | ICD-10-CM | POA: Insufficient documentation

## 2024-07-19 DIAGNOSIS — T8149XA Infection following a procedure, other surgical site, initial encounter: Secondary | ICD-10-CM

## 2024-07-19 DIAGNOSIS — K743 Primary biliary cirrhosis: Secondary | ICD-10-CM | POA: Diagnosis not present

## 2024-07-19 DIAGNOSIS — I119 Hypertensive heart disease without heart failure: Secondary | ICD-10-CM | POA: Diagnosis not present

## 2024-07-19 DIAGNOSIS — Z7901 Long term (current) use of anticoagulants: Secondary | ICD-10-CM | POA: Insufficient documentation

## 2024-07-19 DIAGNOSIS — L02211 Cutaneous abscess of abdominal wall: Secondary | ICD-10-CM | POA: Diagnosis not present

## 2024-07-19 DIAGNOSIS — S30851A Superficial foreign body of abdominal wall, initial encounter: Secondary | ICD-10-CM | POA: Insufficient documentation

## 2024-07-19 DIAGNOSIS — K449 Diaphragmatic hernia without obstruction or gangrene: Secondary | ICD-10-CM | POA: Diagnosis not present

## 2024-07-19 DIAGNOSIS — G709 Myoneural disorder, unspecified: Secondary | ICD-10-CM | POA: Diagnosis not present

## 2024-07-19 DIAGNOSIS — I480 Paroxysmal atrial fibrillation: Secondary | ICD-10-CM | POA: Insufficient documentation

## 2024-07-19 DIAGNOSIS — K219 Gastro-esophageal reflux disease without esophagitis: Secondary | ICD-10-CM | POA: Diagnosis not present

## 2024-07-19 DIAGNOSIS — Y839 Surgical procedure, unspecified as the cause of abnormal reaction of the patient, or of later complication, without mention of misadventure at the time of the procedure: Secondary | ICD-10-CM | POA: Diagnosis not present

## 2024-07-19 DIAGNOSIS — A630 Anogenital (venereal) warts: Secondary | ICD-10-CM

## 2024-07-19 DIAGNOSIS — E119 Type 2 diabetes mellitus without complications: Secondary | ICD-10-CM | POA: Insufficient documentation

## 2024-07-19 DIAGNOSIS — N289 Disorder of kidney and ureter, unspecified: Secondary | ICD-10-CM | POA: Insufficient documentation

## 2024-07-19 HISTORY — PX: LAPAROTOMY: SHX154

## 2024-07-19 HISTORY — DX: Contusion of abdominal wall, initial encounter: S30.1XXA

## 2024-07-19 HISTORY — DX: Contusion of abdominal wall, initial encounter: S30.11XA

## 2024-07-19 HISTORY — PX: INCISION AND DRAINAGE ABSCESS: SHX5864

## 2024-07-19 HISTORY — PX: FOREIGN BODY REMOVAL ABDOMINAL: SHX5319

## 2024-07-19 HISTORY — PX: CONDYLOMA EXCISION/FULGURATION: SHX1389

## 2024-07-19 SURGERY — REMOVAL, FOREIGN BODY, ABDOMEN
Anesthesia: General

## 2024-07-19 MED ORDER — 0.9 % SODIUM CHLORIDE (POUR BTL) OPTIME
TOPICAL | Status: DC | PRN
  Administered 2024-07-19: 200 mL

## 2024-07-19 MED ORDER — BUPIVACAINE LIPOSOME 1.3 % IJ SUSP: 20.0000 mL | Freq: Once | INTRAMUSCULAR | Status: DC

## 2024-07-19 MED ORDER — BUPIVACAINE-EPINEPHRINE (PF) 0.25% -1:200000 IJ SOLN
INTRAMUSCULAR | Status: DC | PRN
  Administered 2024-07-19 (×2): 15 mL

## 2024-07-19 MED ORDER — ACETAMINOPHEN 500 MG PO TABS
ORAL_TABLET | ORAL | Status: AC
  Filled 2024-07-19: qty 2

## 2024-07-19 MED ORDER — CEPHALEXIN 500 MG PO CAPS: 500.0000 mg | ORAL_CAPSULE | Freq: Four times a day (QID) | ORAL | 0 refills | Status: DC

## 2024-07-19 MED ORDER — PROPOFOL 10 MG/ML IV BOLUS
INTRAVENOUS | Status: AC
  Filled 2024-07-19: qty 20

## 2024-07-19 MED ORDER — LACTATED RINGERS IV SOLN: INTRAVENOUS | Status: DC | PRN

## 2024-07-19 MED ORDER — LIDOCAINE HCL (CARDIAC) PF 100 MG/5ML IV SOSY
PREFILLED_SYRINGE | INTRAVENOUS | Status: DC | PRN
Start: 2024-07-19 — End: 2024-07-19
  Administered 2024-07-19: 60 mg via INTRAVENOUS

## 2024-07-19 MED ORDER — HYDROCODONE-ACETAMINOPHEN 5-325 MG PO TABS: 1.0000 | ORAL_TABLET | Freq: Four times a day (QID) | ORAL | 0 refills | Status: DC | PRN

## 2024-07-19 MED ORDER — PROPOFOL 1000 MG/100ML IV EMUL
INTRAVENOUS | Status: AC
  Filled 2024-07-19: qty 100

## 2024-07-19 MED ORDER — CHLORHEXIDINE GLUCONATE CLOTH 2 % EX PADS: 6.0000 | MEDICATED_PAD | Freq: Once | CUTANEOUS | Status: DC

## 2024-07-19 MED ORDER — GABAPENTIN 300 MG PO CAPS
300.0000 mg | ORAL_CAPSULE | ORAL | Status: AC
  Administered 2024-07-19: 300 mg via ORAL

## 2024-07-19 MED ORDER — ONDANSETRON HCL 4 MG/2ML IJ SOLN
INTRAMUSCULAR | Status: AC
  Filled 2024-07-19: qty 2

## 2024-07-19 MED ORDER — ORAL CARE MOUTH RINSE: 15.0000 mL | Freq: Once | OROMUCOSAL | Status: AC

## 2024-07-19 MED ORDER — MIDAZOLAM HCL 2 MG/2ML IJ SOLN
INTRAMUSCULAR | Status: AC
  Filled 2024-07-19: qty 2

## 2024-07-19 MED ORDER — ROCURONIUM BROMIDE 100 MG/10ML IV SOLN
INTRAVENOUS | Status: DC | PRN
  Administered 2024-07-19: 50 mg via INTRAVENOUS

## 2024-07-19 MED ORDER — FENTANYL CITRATE (PF) 100 MCG/2ML IJ SOLN
INTRAMUSCULAR | Status: AC
  Filled 2024-07-19: qty 2

## 2024-07-19 MED ORDER — DEXAMETHASONE SODIUM PHOSPHATE 10 MG/ML IJ SOLN
INTRAMUSCULAR | Status: DC | PRN
  Administered 2024-07-19: 5 mg via INTRAVENOUS

## 2024-07-19 MED ORDER — LACTATED RINGERS IV SOLN
INTRAVENOUS | Status: DC
Start: 2024-07-19 — End: 2024-07-19

## 2024-07-19 MED ORDER — CHLORHEXIDINE GLUCONATE 0.12 % MT SOLN
OROMUCOSAL | Status: AC
Start: 2024-07-19 — End: 2024-07-19
  Filled 2024-07-19: qty 15

## 2024-07-19 MED ORDER — OXYCODONE HCL 5 MG PO TABS: 5.0000 mg | ORAL_TABLET | Freq: Once | ORAL | Status: DC | PRN

## 2024-07-19 MED ORDER — CEFAZOLIN SODIUM-DEXTROSE 2-4 GM/100ML-% IV SOLN
2.0000 g | INTRAVENOUS | Status: AC
  Administered 2024-07-19: 2 g via INTRAVENOUS

## 2024-07-19 MED ORDER — ACETAMINOPHEN 500 MG PO TABS
1000.0000 mg | ORAL_TABLET | ORAL | Status: AC
  Administered 2024-07-19: 1000 mg via ORAL

## 2024-07-19 MED ORDER — GLYCOPYRROLATE 0.2 MG/ML IJ SOLN
INTRAMUSCULAR | Status: AC
  Filled 2024-07-19: qty 1

## 2024-07-19 MED ORDER — ROCURONIUM BROMIDE 10 MG/ML (PF) SYRINGE
PREFILLED_SYRINGE | INTRAVENOUS | Status: AC
Start: 2024-07-19 — End: 2024-07-19
  Filled 2024-07-19: qty 10

## 2024-07-19 MED ORDER — LIDOCAINE HCL (PF) 2 % IJ SOLN
INTRAMUSCULAR | Status: AC
  Filled 2024-07-19: qty 5

## 2024-07-19 MED ORDER — HYDROGEN PEROXIDE 3 % EX SOLN
CUTANEOUS | Status: DC | PRN
  Administered 2024-07-19: 1

## 2024-07-19 MED ORDER — MIDAZOLAM HCL 2 MG/2ML IJ SOLN
INTRAMUSCULAR | Status: DC | PRN
  Administered 2024-07-19: 2 mg via INTRAVENOUS

## 2024-07-19 MED ORDER — PROPOFOL 10 MG/ML IV BOLUS
INTRAVENOUS | Status: DC | PRN
  Administered 2024-07-19: 50 ug/kg/min via INTRAVENOUS
  Administered 2024-07-19: 150 mg via INTRAVENOUS

## 2024-07-19 MED ORDER — FENTANYL CITRATE (PF) 100 MCG/2ML IJ SOLN
INTRAMUSCULAR | Status: DC | PRN
  Administered 2024-07-19: 25 ug via INTRAVENOUS
  Administered 2024-07-19: 50 ug via INTRAVENOUS
  Administered 2024-07-19: 25 ug via INTRAVENOUS

## 2024-07-19 MED ORDER — DROPERIDOL 2.5 MG/ML IJ SOLN
0.6250 mg | Freq: Once | INTRAMUSCULAR | Status: AC | PRN
  Administered 2024-07-19: 0.625 mg via INTRAVENOUS

## 2024-07-19 MED ORDER — DROPERIDOL 2.5 MG/ML IJ SOLN
INTRAMUSCULAR | Status: AC
Start: 2024-07-19 — End: 2024-07-19
  Filled 2024-07-19: qty 2

## 2024-07-19 MED ORDER — CHLORHEXIDINE GLUCONATE CLOTH 2 % EX PADS
6.0000 | MEDICATED_PAD | Freq: Once | CUTANEOUS | Status: AC
  Administered 2024-07-19: 6 via TOPICAL

## 2024-07-19 MED ORDER — CEFAZOLIN SODIUM-DEXTROSE 2-4 GM/100ML-% IV SOLN
INTRAVENOUS | Status: AC
  Filled 2024-07-19: qty 100

## 2024-07-19 MED ORDER — SUGAMMADEX SODIUM 200 MG/2ML IV SOLN
INTRAVENOUS | Status: DC | PRN
  Administered 2024-07-19: 100 mg via INTRAVENOUS
  Administered 2024-07-19 (×2): 50 mg via INTRAVENOUS
  Administered 2024-07-19: 200 mg via INTRAVENOUS

## 2024-07-19 MED ORDER — BUPIVACAINE-EPINEPHRINE (PF) 0.25% -1:200000 IJ SOLN
INTRAMUSCULAR | Status: AC
  Filled 2024-07-19: qty 30

## 2024-07-19 MED ORDER — ACETAMINOPHEN 10 MG/ML IV SOLN: 1000.0000 mg | Freq: Once | INTRAVENOUS | Status: DC | PRN

## 2024-07-19 MED ORDER — FENTANYL CITRATE (PF) 100 MCG/2ML IJ SOLN: 25.0000 ug | INTRAMUSCULAR | Status: DC | PRN

## 2024-07-19 MED ORDER — EPHEDRINE SULFATE-NACL 50-0.9 MG/10ML-% IV SOSY
PREFILLED_SYRINGE | INTRAVENOUS | Status: DC | PRN
  Administered 2024-07-19: 10 mg via INTRAVENOUS

## 2024-07-19 MED ORDER — CHLORHEXIDINE GLUCONATE 0.12 % MT SOLN
15.0000 mL | Freq: Once | OROMUCOSAL | Status: AC
  Administered 2024-07-19: 15 mL via OROMUCOSAL

## 2024-07-19 MED ORDER — DEXAMETHASONE SODIUM PHOSPHATE 10 MG/ML IJ SOLN
INTRAMUSCULAR | Status: AC
  Filled 2024-07-19: qty 1

## 2024-07-19 MED ORDER — SEVOFLURANE IN SOLN
RESPIRATORY_TRACT | Status: AC
  Filled 2024-07-19: qty 250

## 2024-07-19 MED ORDER — ONDANSETRON HCL 4 MG/2ML IJ SOLN
INTRAMUSCULAR | Status: DC | PRN
Start: 2024-07-19 — End: 2024-07-19
  Administered 2024-07-19: 4 mg via INTRAVENOUS

## 2024-07-19 MED ORDER — OXYCODONE HCL 5 MG/5ML PO SOLN: 5.0000 mg | Freq: Once | ORAL | Status: DC | PRN

## 2024-07-19 MED ORDER — GABAPENTIN 300 MG PO CAPS
ORAL_CAPSULE | ORAL | Status: AC
  Filled 2024-07-19: qty 1

## 2024-07-19 SURGICAL SUPPLY — 52 items
BLADE CLIPPER SURG (BLADE) IMPLANT
BLADE SURG 15 STRL LF DISP TIS (BLADE) ×1 IMPLANT
BRIEF MESH DISP 2XL (UNDERPADS AND DIAPERS) ×1 IMPLANT
CHLORAPREP W/TINT 26 (MISCELLANEOUS) ×1 IMPLANT
CLIP APPLIE 11 MED OPEN (CLIP) IMPLANT
CLIP APPLIE 13 LRG OPEN (CLIP) IMPLANT
DRAIN PENROSE 12X.25 LTX STRL (MISCELLANEOUS) IMPLANT
DRAIN PENROSE 5/8X18 LTX STRL (DRAIN) IMPLANT
DRAPE C-SECTION (MISCELLANEOUS) ×1 IMPLANT
DRAPE LAPAROTOMY 100X77 ABD (DRAPES) ×1 IMPLANT
DRAPE LAPAROTOMY 77X122 PED (DRAPES) ×1 IMPLANT
DRAPE LEGGINS SURG 28X43 STRL (DRAPES) ×1 IMPLANT
DRAPE TABLE BACK 80X90 (DRAPES) ×1 IMPLANT
DRAPE UNDER BUTTOCK W/FLU (DRAPES) ×1 IMPLANT
DRSG GAUZE FLUFF 36X18 (GAUZE/BANDAGES/DRESSINGS) ×1 IMPLANT
ELECT BLADE 6.5 EXT (BLADE) ×1 IMPLANT
ELECT CAUTERY BLADE 6.4 (BLADE) ×1 IMPLANT
ELECTRODE CAUTERY BLDE TIP 2.5 (TIP) ×1 IMPLANT
ELECTRODE REM PT RTRN 9FT ADLT (ELECTROSURGICAL) ×1 IMPLANT
GAUZE 4X4 16PLY ~~LOC~~+RFID DBL (SPONGE) IMPLANT
GAUZE SPONGE 4X4 12PLY STRL (GAUZE/BANDAGES/DRESSINGS) IMPLANT
GLOVE ORTHO TXT STRL SZ7.5 (GLOVE) ×2 IMPLANT
GOWN STRL REUS W/ TWL LRG LVL3 (GOWN DISPOSABLE) ×2 IMPLANT
GOWN STRL REUS W/ TWL XL LVL3 (GOWN DISPOSABLE) ×2 IMPLANT
HANDLE YANKAUER SUCT BULB TIP (MISCELLANEOUS) IMPLANT
KIT TURNOVER KIT A (KITS) ×1 IMPLANT
LIGASURE IMPACT 36 18CM CVD LR (INSTRUMENTS) IMPLANT
MANIFOLD NEPTUNE II (INSTRUMENTS) ×1 IMPLANT
NDL HYPO 22X1.5 SAFETY MO (MISCELLANEOUS) ×1 IMPLANT
NDL SAFETY ECLIPSE 18X1.5 (NEEDLE) ×1 IMPLANT
NEEDLE HYPO 22X1.5 SAFETY MO (MISCELLANEOUS) ×1 IMPLANT
NS IRRIG 1000ML POUR BTL (IV SOLUTION) ×1 IMPLANT
NS IRRIG 500ML POUR BTL (IV SOLUTION) ×1 IMPLANT
PACK BASIN MAJOR ARMC (MISCELLANEOUS) ×1 IMPLANT
PACK BASIN MINOR ARMC (MISCELLANEOUS) ×1 IMPLANT
PACK COLON CLEAN CLOSURE (MISCELLANEOUS) IMPLANT
PAD ABD DERMACEA PRESS 5X9 (GAUZE/BANDAGES/DRESSINGS) IMPLANT
SOLUTION PREP PVP 2OZ (MISCELLANEOUS) ×1 IMPLANT
SPONGE T-LAP 18X18 ~~LOC~~+RFID (SPONGE) IMPLANT
SPONGE VERSALON 4X4 4PLY (MISCELLANEOUS) IMPLANT
STAPLER SKIN PROX 35W (STAPLE) ×1 IMPLANT
SURGILUBE 2OZ TUBE FLIPTOP (MISCELLANEOUS) ×1 IMPLANT
SUT PDS AB 1 CT 36 (SUTURE) ×3 IMPLANT
SUT SILK 3 0 SH CR/8 (SUTURE) ×1 IMPLANT
SUT VICRYL 2-0 54IN ABS (SUTURE) IMPLANT
SUTURE EHLN 3-0 FS-10 30 BLK (SUTURE) IMPLANT
SWAB CULTURE AMIES ANAERIB BLU (MISCELLANEOUS) IMPLANT
SYR 10ML LL (SYRINGE) ×1 IMPLANT
TRAP FLUID SMOKE EVACUATOR (MISCELLANEOUS) ×1 IMPLANT
TRAY FOLEY MTR SLVR 16FR STAT (SET/KITS/TRAYS/PACK) ×1 IMPLANT
VACUUM HOSE 7/8X10 W/ WAND (MISCELLANEOUS) ×1 IMPLANT
WATER STERILE IRR 500ML POUR (IV SOLUTION) ×1 IMPLANT

## 2024-07-19 NOTE — Anesthesia Postprocedure Evaluation (Signed)
 Anesthesia Post Note  Patient: [name]  Procedure(s) Performed: REMOVAL, FOREIGN BODY, ABDOMEN INCISION AND DRAINAGE, ABSCESS LAPAROTOMY, EXPLORATORY REMOVAL, CONDYLOMA  Patient location during evaluation: PACU Anesthesia Type: General Level of consciousness: awake and alert Pain management: pain level controlled Vital Signs Assessment: post-procedure vital signs reviewed and stable Respiratory status: spontaneous breathing, nonlabored ventilation, respiratory function stable and patient connected to nasal cannula oxygen Cardiovascular status: blood pressure returned to baseline and stable Postop Assessment: no apparent nausea or vomiting Anesthetic complications: no   No notable events documented.   Last Vitals:  Vitals:   07/19/24 1000 07/19/24 1011  BP: 127/67 (!) 151/77  Pulse: (!) 58 (!) 55  Resp: 17 16  Temp: (!) 36.1 C (!) 36.2 C  SpO2: 95% 97%    Last Pain:  Vitals:   07/19/24 1011  TempSrc: Temporal  PainSc: 0-No pain                 Lynwood KANDICE Clause

## 2024-07-19 NOTE — Interval H&P Note (Signed)
 History and Physical Interval Note:  07/19/2024 7:21 AM  [name]  has presented today for surgery, with the diagnosis of abscess of abdominal wall foreign body of abdominal wall.  The various methods of treatment have been discussed with the patient and family. After consideration of risks, benefits and other options for treatment, the patient has consented to  Procedure(s) with comments: REMOVAL, FOREIGN BODY, ABDOMEN (N/A) INCISION AND DRAINAGE, ABSCESS (N/A) - Incision and drainage of abdominal wall abscess LAPAROTOMY, EXPLORATORY (N/A) - VIA PREVIOUS LAPAROTOMY INCISION REMOVAL, CONDYLOMA (N/A) as a surgical intervention.  The patient's history has been reviewed, patient examined, no change in status, stable for surgery.  I have reviewed the patient's chart and labs.  Questions were answered to the patient's satisfaction.     Honor Leghorn

## 2024-07-19 NOTE — Telephone Encounter (Signed)
 Patient is out of the hospital and would like to be rescheduled for follow up. Please advise on reschedule.  Thank you

## 2024-07-19 NOTE — Transfer of Care (Signed)
 Immediate Anesthesia Transfer of Care Note  Patient: [name]  Procedure(s) Performed: REMOVAL, FOREIGN BODY, ABDOMEN INCISION AND DRAINAGE, ABSCESS LAPAROTOMY, EXPLORATORY REMOVAL, CONDYLOMA  Patient Location: PACU  Anesthesia Type:General  Level of Consciousness: drowsy and responds to stimulation  Airway & Oxygen Therapy: Patient Spontanous Breathing and Patient connected to nasal cannula oxygen  Post-op Assessment: Report given to RN and Post -op Vital signs reviewed and stable  Post vital signs: Reviewed and stable  Last Vitals:  Vitals Value Taken Time  BP 158/79 07/19/24 09:02  Temp    Pulse 79 07/19/24 09:02  Resp 22 07/19/24 09:02  SpO2 100 % 07/19/24 09:02  Vitals shown include unfiled device data.  Last Pain:  Vitals:   07/19/24 0902  TempSrc:   PainSc: 0-No pain         Complications: No notable events documented.

## 2024-07-19 NOTE — Telephone Encounter (Signed)
 [employer]  Prior Authorization for [employer]OL[employer]  APPROVED PA# [id number] Valid: 07/15/24-07/14/25

## 2024-07-19 NOTE — Telephone Encounter (Signed)
 Pt had labs on 9/8. Ok to r/s MD/ venofer  in the next 1-2 weeks

## 2024-07-19 NOTE — Anesthesia Preprocedure Evaluation (Signed)
 Anesthesia Evaluation  Patient identified by MRN, date of birth, ID band Patient awake    Reviewed: Allergy & Precautions, H&P , NPO status , Patient's Chart, lab work & pertinent test results, reviewed documented beta blocker date and time   Airway Mallampati: II  TM Distance: >3 FB Neck ROM: full    Dental  (+) Teeth Intact   Pulmonary asthma , sleep apnea    Pulmonary exam normal        Cardiovascular Exercise Tolerance: Good hypertension, On Medications negative cardio ROS Atrial Fibrillation  Rhythm:regular Rate:Normal     Neuro/Psych  Headaches  Neuromuscular disease  negative psych ROS   GI/Hepatic hiatal hernia,GERD  Medicated,,(+) Hepatitis -  Endo/Other  negative endocrine ROS    Renal/GU Renal disease  negative genitourinary   Musculoskeletal   Abdominal   Peds  Hematology  (+) Blood dyscrasia, anemia   Anesthesia Other Findings Past Medical History: No date: Abdominal wall seroma No date: Asthma due to seasonal allergies No date: Bradycardia No date: Diastolic dysfunction No date: Dysplasia of cervix, low grade (CIN 1) No date: GERD (gastroesophageal reflux disease) No date: Hiatal hernia     Comment:  a.) s/p repair concurrently with biliopancreatic               diversion with duodenal switch procedure in 2017 No date: History of diabetes mellitus, type II     Comment:  a.) resolved after bariatric surgery done in 2017 No date: History of hiatal hernia s/p repair No date: History of kidney stones No date: History of obstructive sleep apnea     Comment:  a.) resolved after bariatric surgery done in 2017; has               had repeat PSG testing that was negative since surgery No date: HTN (hypertension) No date: Hyperlipidemia No date: Iron  deficiency anemia No date: MRSA carrier No date: Multinodular goiter No date: NAFL (nonalcoholic fatty liver) No date: Nephrolithiasis 09/29/2023: New  onset atrial fibrillation (HCC)     Comment:  a.) Dx'd 09/2023; b.) CHA2DS2VASc = 3 (sex, HTN,  T2DM)               as of 01/24/2024; b.) rate/rhythm maintained               intrinsically without need for pharmacological               intervention; chronically anticoagulated with apixaban  No date: On apixaban  therapy No date: Osteoporosis No date: Primary biliary cirrhosis (HCC) No date: Skin cancer of nose 08/03/2016: Status post biliopancreatic diversion with duodenal switch No date: Wears glasses Past Surgical History: 01/26/2024: APPLICATION OF WOUND VAC; N/A     Comment:  Procedure: APPLICATION, WOUND VAC;  Surgeon: Lane Shope, MD;  Location: [facility];  Service: General;                Laterality: N/A; 07/2021: AUGMENTATION MAMMAPLASTY; Bilateral     Comment:  Implants and lift 07/05/2022: AUGMENTATION MAMMAPLASTY; Bilateral     Comment:  Dr Vinita, replaced implants 08/03/2016: BARIATRIC SURGERY     Comment:  LAPAROSCOPY DUODENAL SWITCH AND HIATAL HERNIA REPAIR;               Dr. Wolm @[facility] , [place] No date: BRACHIOPLASTY; Bilateral 2017: BREAST BIOPSY; Right     Comment:  FIBROCYSTIC CHANGES WITH CALCIFICATIONS No date: CESAREAN SECTION  Comment:  x 2 09/11/2019: COLONOSCOPY WITH PROPOFOL ; N/A     Comment:  Procedure: COLONOSCOPY WITH PROPOFOL ;  Surgeon: Therisa Bi, MD;  Location: [facility] ENDOSCOPY;  Service:               Gastroenterology;  Laterality: N/A; 02/06/2022: CYSTOSCOPY WITH RETROGRADE PYELOGRAM, URETEROSCOPY AND  STENT PLACEMENT; Right 09/10/2017: CYSTOSCOPY WITH STENT PLACEMENT; Right     Comment:  Procedure: CYSTOSCOPY WITH STENT PLACEMENT;  Surgeon:               Twylla Glendia BROCKS, MD;  Location: [facility];  Service:               Urology;  Laterality: Right; 04/24/2019: CYSTOSCOPY/URETEROSCOPY/HOLMIUM LASER/STENT PLACEMENT;  Right     Comment:  Procedure: CYSTOSCOPY/URETEROSCOPY/STENT PLACEMENT/               RIGHT  RETROGRADE;  Surgeon: Nieves Cough, MD;                Location: [facility];  Service: Urology;               Laterality: Right; 10/28/2017: DX LAPAROSCOPY/  EGD/  HIATAL HERNIA REPAIR CLOSURE OF  INTERNAL MENSENTRY DEFECT     Comment:  @[facility] , [place] 01/30/2019: EXTRACORPOREAL SHOCK WAVE LITHOTRIPSY; N/A     Comment:  Procedure: EXTRACORPOREAL SHOCK WAVE LITHOTRIPSY (ESWL)               LEFT/ POSSIBLE RIGHT;  Surgeon: Nieves Cough, MD;                Location: [facility];  Service: Urology;  Laterality: N/A; 03/20/2019: EXTRACORPOREAL SHOCK WAVE LITHOTRIPSY; Right     Comment:  Procedure: EXTRACORPOREAL SHOCK WAVE LITHOTRIPSY (ESWL);              Surgeon: Matilda Senior, MD;  Location: [facility];                Service: Urology;  Laterality: Right; 10/30/2021: EXTRACORPOREAL SHOCK WAVE LITHOTRIPSY; Left     Comment:  Procedure: EXTRACORPOREAL SHOCK WAVE LITHOTRIPSY (ESWL);              Surgeon: Cam Morene ORN, MD;  Location: [facility];  Service: Urology;  Laterality: Left; 10/15/2016: LAPAROSCOPIC CHOLECYSTECTOMY     Comment:  @[facility]  , [place] 01/26/2024: LAPAROTOMY; N/A     Comment:  Procedure: LAPAROTOMY, EXPLORATORY of abdominal wall               wounds, for removal of foreign body; ultrasound-guided               aspiratio of seroma;  Surgeon: Lane Shope, MD;                Location: [facility];  Service: General;  Laterality: N/A; 03/29/2024: LAPAROTOMY; N/A     Comment:  Procedure: LAPAROTOMY, EXPLORATORY;  Surgeon: Lane Shope, MD;  Location: [facility];  Service: General;                Laterality: N/A;  Abdominal wall, drain/resection seroma               cavity No date: LIPOSUCTION No date: PANNICULECTOMY     Comment:  2022 child:  TONSILLECTOMY AND ADENOIDECTOMY 09/10/2017: URETEROSCOPY WITH HOLMIUM LASER LITHOTRIPSY; Right     Comment:  Procedure: URETEROSCOPY WITH HOLMIUM LASER LITHOTRIPSY;                Surgeon: Twylla Glendia BROCKS, MD;  Location: [facility];                Service: Urology;  Laterality: Right; BMI    Body Mass Index: 27.82 kg/m     Reproductive/Obstetrics negative OB ROS                              Anesthesia Physical Anesthesia Plan  ASA: 3  Anesthesia Plan: General ETT   Post-op Pain Management:    Induction:   PONV Risk Score and Plan:   Airway Management Planned:   Additional Equipment:   Intra-op Plan:   Post-operative Plan:   Informed Consent: I have reviewed the patients History and Physical, chart, labs and discussed the procedure including the risks, benefits and alternatives for the proposed anesthesia with the patient or authorized representative who has indicated his/her understanding and acceptance.     Dental Advisory Given  Plan Discussed with: CRNA  Anesthesia Plan Comments:         Anesthesia Quick Evaluation

## 2024-07-19 NOTE — Anesthesia Procedure Notes (Addendum)
 Procedure Name: Intubation Date/Time: 07/19/2024 7:49 AM  Performed by: Lorrene Camelia LABOR, CRNAPre-anesthesia Checklist: Patient identified, Patient being monitored, Timeout performed, Emergency Drugs available and Suction available Patient Re-evaluated:Patient Re-evaluated prior to induction Oxygen Delivery Method: Circle system utilized Preoxygenation: Pre-oxygenation with 100% oxygen Induction Type: IV induction Ventilation: Mask ventilation without difficulty Laryngoscope Size: 3 and McGrath Grade View: Grade I Tube type: Oral Tube size: 7.0 mm Number of attempts: 1 Airway Equipment and Method: Stylet Placement Confirmation: ETT inserted through vocal cords under direct vision, positive ETCO2 and breath sounds checked- equal and bilateral Secured at: 21 cm Tube secured with: Tape Dental Injury: Teeth and Oropharynx as per pre-operative assessment

## 2024-07-19 NOTE — Op Note (Signed)
 Incision and drainage of abdominal wall abscess, with retrieval of permanent suture foreign body.  Excision/ablation of perianal external condyloma.  Anoscopy.  Pre-operative Diagnosis: Recurrent abdominal wall abscess, with history of permanent suture abscesses.  Anal condyloma.  Post-operative Diagnosis: same.    Surgeon: Honor Leghorn, M.D., FACS  Anesthesia: General  Findings: Looped locking permanent suture fragment found within incisional abscess from prior abdominal surgery.  No intra anal/anal canal evidence of condyloma, all condylomata were external.  Estimated Blood Loss: 10 mL         Specimens: Suture removal, photo taken and stored in media.  Looped end of Prolene type suture.  Culture of purulent drainage taken for assurance of sensitivity.  Anoscopy completed with no evidence of intra anal warts, external anal warts sent as permanent section to pathology.          Complications: none              Procedure Details  The patient was seen again in the Holding Room. The benefits, complications, treatment options, and expected outcomes were discussed with the patient. The risks of bleeding, infection, recurrence of symptoms, failure to resolve symptoms, unanticipated injury, prosthetic placement, prosthetic infection, any of which could require further surgery were reviewed with the patient. The likelihood of improving the patient's symptoms with return to their baseline status is expected.  The patient and/or family concurred with the proposed plan, giving informed consent.  The patient was taken to Operating Room, identified and the procedure verified.    Prior to the induction of general anesthesia, antibiotic prophylaxis was administered. VTE prophylaxis was in place.  General anesthesia was then administered and tolerated well. After the induction, the patient was positioned in the supine position and the abdomen was prepped with  Chloraprep and draped in the sterile fashion.   A Time Out was held and the above information confirmed.  There is an indurated area on the right side of her drainage site from her epigastric midline scar.  Mild pressure there produced purulent drainage from the midline opening.  I then utilized a hemostat to identify the tract to the abscess cavity.  I made an incision along the midline to open up the space and obtain better specimens for culture and sensitivity.  I then ensured adequate curettage of the abscess cavity, after removing the floating loop of suture material.  There is no evidence of any residual suture present.  The cephalad aspect of the previous scar was totally intact without evidence of any tracking or tunneling.  With adequate hemostasis obtained, irrigated the wound with dilute peroxide and saline solution.  I then placed a half-inch packing strip within the abscess cavity and withdrew it out of the midline closure of staples.  Local infiltration of quarter percent Marcaine  epinephrine  is supplied for postoperative pain control.  Dry dressing applied.  The patient was then repositioned into lithotomy utilizing candycane stirrups. Anoscopy was completed confirming the absence of intracanal warts.  The external warts were locally infiltrated with quarter percent Marcaine  epinephrine  they were all sharply excised.  Judicious electrocautery was utilized to ensure adequate hemostasis.  Specimens were then sent for permanent section.  They all appear benign.  Dry gauze sponges were applied and secured with mesh panties and minimal tape to protect from future bleeding.  Patient was then extubated transferred recovery in stable condition.      Honor Leghorn M.D., [facility] 07/19/2024 9:00 AM

## 2024-07-20 ENCOUNTER — Encounter: Payer: Self-pay | Admitting: Surgery

## 2024-07-20 LAB — SURGICAL PATHOLOGY

## 2024-07-24 ENCOUNTER — Other Ambulatory Visit: Payer: Self-pay | Admitting: Family

## 2024-07-24 ENCOUNTER — Ambulatory Visit: Attending: Cardiology | Admitting: Cardiology

## 2024-07-24 ENCOUNTER — Encounter: Payer: Self-pay | Admitting: Cardiology

## 2024-07-24 VITALS — BP 146/88 | HR 58 | Ht 66.5 in | Wt 174.8 lb

## 2024-07-24 DIAGNOSIS — R600 Localized edema: Secondary | ICD-10-CM

## 2024-07-24 DIAGNOSIS — I480 Paroxysmal atrial fibrillation: Secondary | ICD-10-CM | POA: Diagnosis not present

## 2024-07-24 DIAGNOSIS — Z1231 Encounter for screening mammogram for malignant neoplasm of breast: Secondary | ICD-10-CM

## 2024-07-24 DIAGNOSIS — E876 Hypokalemia: Secondary | ICD-10-CM | POA: Diagnosis not present

## 2024-07-24 DIAGNOSIS — I10 Essential (primary) hypertension: Secondary | ICD-10-CM | POA: Diagnosis not present

## 2024-07-24 LAB — AEROBIC/ANAEROBIC CULTURE W GRAM STAIN (SURGICAL/DEEP WOUND)

## 2024-07-24 NOTE — Patient Instructions (Signed)
 Medication Instructions:  Your physician recommends that you continue on your current medications as directed. Please refer to the Current Medication list given to you today.   *If you need a refill on your cardiac medications before your next appointment, please call your pharmacy*  Lab Work: No labs ordered today  If you have labs (blood work) drawn today and your tests are completely normal, you will receive your results only by: MyChart Message (if you have MyChart) OR A paper copy in the mail If you have any lab test that is abnormal or we need to change your treatment, we will call you to review the results.  Testing/Procedures: Preventise Event Cardiac Monitor 30 day   Follow-Up: At [facility], you and your health needs are our priority.  As part of our continuing mission to provide you with exceptional heart care, our providers are all part of one team.  This team includes your primary Cardiologist (physician) and Advanced Practice Providers or APPs (Physician Assistants and Nurse Practitioners) who all work together to provide you with the care you need, when you need it.  Your next appointment:   3 month(s)  Provider:   You may see Timothy Gollan, MD or one of the following Advanced Practice Providers on your designated Care Team:   Lonni Meager, NP Lesley Maffucci, PA-C Bernardino Bring, PA-C Cadence [place], PA-C Tylene Lunch, NP Barnie Hila, NP

## 2024-07-24 NOTE — Progress Notes (Signed)
 Cardiology Office Note   Date:  07/24/2024  ID:  [name], DOB Oct 31, 1966, MRN [id number] PCP: Dineen Rollene MATSU, FNP  [facility] Providers Cardiologist:  Evalene Lunger, MD     History of Present Illness [name] is a 58 y.o. female with a past medical history of type 2 diabetes, primary biliary cirrhosis, GERD, status post bariatric surgery with fat-soluble vitamin deficiency, hypertension, paroxysmal atrial fibrillation, who is here today to follow-up on her paroxysmal atrial fibrillation.   She presented to the [facility] emergency department via EMS from work with complaints of dizziness and palpitations.  On rhythm strip from EMS she was noted to be in atrial fibrillation with RVR.  She states she had palpitations that started while at work with occasional episodes in the past.  She was found to be: Intermittently and out of atrial fibrillation with rates as high as 160s per EMS.  She was not given any medications en route to the emergency department.  She states that she feels like her chest is pounding when it happens she feels short of breath and that she may pass out. Felt as if she was having tunnel vision and had to lay her head down at her workstation.  States that prior to the episode starting she did have an energy drink which is not abnormal for her but denied any energy shots.  She denies any illicit drug use, alcohol use or other stimulants.  She does not have a history of arrhythmia.  She has never seen cardiology in the past and has no significant medical history of cardiac problems.Initial vital signs: Blood pressure 215/109, pulse of 77, respirations 19, temperature 98.1. Pertinent labs: Potassium 2.9, alkaline phosphatase 154, D-dimer 0.65, TSH 0.908, free T41.08, high-sensitivity troponin 11 x 2. Imaging: Chest x-ray with no acute cardiopulmonary abnormality; VQ lung scan showed normal perfusion exam with no evidence of acute pulmonary embolism.  Medications administered in the emergency department potassium chloride  10 mill equivalents IVPB, potassium chloride  40 mEq oral, Lovenox  70 mg subcu, diltiazem  10 mg IV.  Echocardiogram was completed which revealed an LVEF 55 to 60%, no RWMA, G1 DD, and no valvular abnormalities.  She was started on apixaban  5 mg twice daily for CHA2DS2-VASc score of at least 3 for stroke prophylaxis.  She was also continued on metoprolol  succinate 12.5 mg daily but dosing was limited secondary to baseline bradycardia.  It was also recommended she consider Apple Watch to monitor for atrial fibrillation.  She had converted to sinus rhythm after receiving IV diltiazem  in the emergency department.  She was considered stable and was discharged home on 09/30/2023.  She was seen in clinic 12/21/2023 and at that time was doing well from a cardiac perspective.  Unfortunately she had not been taking her apixaban  since the first 30 days she was placed on the medication.  She was restarted on apixaban  5 mg twice daily for CHA2DS2-VASc of at least 3 for stroke prophylaxis.   She was last seen in clinic 03/20/2024 states she had been doing well with a cardiac perspective.  She had surgery and that was pending.  Stated that she has been taking her medications unfortunately stated she was only supposed to take the apixaban  once daily.  She was encouraged to take her apixaban  as scheduled to twice daily.  She returns to clinic today stating that overall from a cardiac perspective she has been doing well.  She has noted as of late swelling to her ankles.  She has just come to her appointment today after working 17 hours at work.  She states that she has been standing a lot more at work versus sitting and has noted an increased amount of swelling without the swelling disappearing when she lies down in the bed for the couple of hours that she typically does sleep.  She also has noted taking her amlodipine  twice daily for total of 10 mg which was  discussed today of being a common side effect.  She denies any palpitations or issues with her atrial fibrillation.  She states that previously she had been off of her apixaban  for approximately a month and a half as she had surgery and then there was an issue with pharmacy filling the medication saying it was too early for refill.  She states that she has been back on it twice daily dosing but has concerns today and wants to know if there is a way for her to completely come off of the blood thinning medication as she has had no reoccurrence of her atrial fibrillation.  She denies any bleeding with no blood noted in her urine or stool with being on the medication.  She recently had surgery 07/19/2024 for the incision and drainage of abdominal wall abscess and retrieval of permanent suture foreign body.  She stated that she tolerated the procedure well with no complications noted.  ROS: 10 point review of systems has been reviewed and considered negative except ones been listed in the HPI  Studies Reviewed EKG Interpretation Date/Time:  Monday July 24 2024 08:53:17 EDT Ventricular Rate:  58 PR Interval:  146 QRS Duration:  98 QT Interval:  424 QTC Calculation: 416 R Axis:   -16  Text Interpretation: Sinus bradycardia Moderate voltage criteria for LVH, may be normal variant ( R in aVL , Cornell product ) When compared with ECG of 20-Mar-2024 08:06, No significant change was found Confirmed by Gerard Frederick ([id number]) on 07/24/2024 9:04:10 AM    2D echo 09/30/2023 1. Left ventricular ejection fraction, by estimation, is 55 to 60%. The  left ventricle has normal function. The left ventricle has no regional  wall motion abnormalities. Left ventricular diastolic parameters are  consistent with Grade I diastolic  dysfunction (impaired relaxation).   2. Right ventricular systolic function is normal. The right ventricular  size is normal. There is normal pulmonary artery systolic pressure. The   estimated right ventricular systolic pressure is 18.4 mmHg.   3. The mitral valve is normal in structure. No evidence of mitral valve  regurgitation. No evidence of mitral stenosis.   4. The aortic valve is tricuspid. Aortic valve regurgitation is not  visualized. Aortic valve sclerosis is present, with no evidence of aortic  valve stenosis.   5. The inferior vena cava is normal in size with greater than 50%  respiratory variability, suggesting right atrial pressure of 3 mmHg.   Risk Assessment/Calculations  CHA2DS2-VASc Score = 3   This indicates a 3.2% annual risk of stroke. The patient's score is based upon: CHF History: 0 HTN History: 1 Diabetes History: 1 Stroke History: 0 Vascular Disease History: 0 Age Score: 0 Gender Score: 1        Physical Exam VS:  BP (!) 146/88   Pulse (!) 58   Ht 5' 6.5 (1.689 m)   Wt 174 lb 12.8 oz (79.3 kg)   LMP 07/11/2016 (Exact Date)   SpO2 98%   BMI 27.79 kg/m        Wt  Readings from Last 3 Encounters:  07/24/24 174 lb 12.8 oz (79.3 kg)  07/19/24 175 lb (79.4 kg)  07/17/24 175 lb (79.4 kg)    GEN: Well nourished, well developed in no acute distress NECK: No JVD; No carotid bruits CARDIAC: RRR, no murmurs, rubs, gallops RESPIRATORY:  Clear to auscultation without rales, wheezing or rhonchi  ABDOMEN: Soft, non-tender, non-distended EXTREMITIES:  No edema; No deformity   ASSESSMENT AND PLAN Paroxysmal atrial fibrillation where she has been maintaining sinus rhythm on the EKG today revealing sinus bradycardia with a rate of 58 with LVH no significant changes noted.  States that she has not had any reoccurrence of atrial fibrillation with correction of electrolytes.  She had been out of apixaban  for approximately month and a half and has restarted for CHA2DS2-VASc of at least 3 for stroke prophylaxis.  She is requesting possible to come off of anticoagulation altogether.  She has been scheduled for a 30-day event monitor to determine  burden also discussed possible referral to EP for ILR implantation which she declined.  Discussed concerns over coming off anticoagulation with increasing risk of stroke if she does go back into atrial fibrillation.  Primary hypertension with a blood pressure today of 126/88.  Blood pressure slightly elevated as she stated she had just left work.  She has continued on amlodipine  5 mg twice daily and is currently on antibiotic therapy postoperatively.  We did discuss with her side effects of peripheral edema likely related to her amlodipine  and that we could change her to a different antihypertensive medication if she so decided.  She has been encouraged to continue to monitor pressure 1 to 2 hours postmedication administration as well.  History of hypokalemia with stable potassiums.  Not requiring potassium supplementation.  Stout continue to monitor potassium levels periodically.  Peripheral edema to the bilateral lower extremities that she has noted with increased amlodipine  to 5 mg twice daily.  Encouraged to participate in conservative therapy of elevating extremities when possible and the compression stockings.  Also offered to change antihypertensive medications today changes been deferred until return appointment.       Dispo: Patient to return to clinic to see MD/APP in 3 months or sooner if needed for further evaluation.  Signed, Aly Hauser, NP

## 2024-07-27 ENCOUNTER — Encounter: Payer: Self-pay | Admitting: Surgery

## 2024-07-27 ENCOUNTER — Ambulatory Visit (INDEPENDENT_AMBULATORY_CARE_PROVIDER_SITE_OTHER): Admitting: Surgery

## 2024-07-27 VITALS — BP 146/84 | HR 71 | Temp 98.4°F | Ht 66.5 in | Wt 176.2 lb

## 2024-07-27 DIAGNOSIS — Z09 Encounter for follow-up examination after completed treatment for conditions other than malignant neoplasm: Secondary | ICD-10-CM

## 2024-07-27 DIAGNOSIS — L02211 Cutaneous abscess of abdominal wall: Secondary | ICD-10-CM

## 2024-07-27 NOTE — Patient Instructions (Signed)
 Skin Foreign Body A skin foreign body is an object that is stuck in the skin. Common objects that get stuck in the skin include: Wood splinters. Glass or fiberglass slivers. Rocks or gravel. Metallic objects, such as nails, needles, fish hooks, and BBs. Thorns and cactus spines. Foreign bodies may damage tissue or cause infection. If the foreign body does not cause any pain or infection, it may be okay to leave it in the skin. A growth called a granuloma may form around a foreign body that is left in the skin. What are the causes? This condition is caused by an object getting lodged under the skin, usually by accident. What increases the risk? Children who play in areas with wood, metal, or glass are at a higher risk of getting a foreign body. Adults may get a skin foreign body after breaking glass or while working with wood, fiberglass, or stone. In some cases, the object may get stuck in an open wound after an injury. What are the signs or symptoms? Symptoms of this condition include: Pain or tenderness. A feeling of something being stuck under the skin. Redness. Swelling. How is this diagnosed? This condition is diagnosed based on: Your medical history and symptoms. A physical exam. Imaging tests, such as: X-rays. CT scans. Ultrasounds. How is this treated? Treatment for this condition depends on what the foreign body is, where it is, and whether it is causing infection or other symptoms. Treatment may involve: Flushing the affected area with a salt water solution to remove dirt or debris. Removing all or part of the object with a needle and metal tweezers. In some cases, an incision may be made in the skin to allow access to the object. Waiting to remove the object until it moves closer to the surface of the skin. This may take several days. Leaving the object in place. This may be done if the object is not causing any symptoms or if removal will cause more damage to the skin or  tissue. Taking antibiotic medicines or using antibiotic ointment to treat or prevent infection. Having a surgical procedure to remove a foreign body that is deep inside the tissue or that has been covered by a granuloma. Follow these instructions at home: Wound or incision care  If the foreign body was removed, follow instructions from your health care provider about how to take care of your wound or incision. Make sure you: Wash your hands with soap and water for at least 20 seconds before and after you change your bandage (dressing). If soap and water are not available, use hand sanitizer. Change your dressing as told by your health care provider. Leave stitches (sutures), skin glue, or adhesive strips in place. These skin closures may need to stay in place for 2 weeks or longer. If adhesive strip edges start to loosen and curl up, you may trim the loose edges. Do not remove adhesive strips completely unless your health care provider tells you to do that. Check your wound or incision every day for signs of infection. This is especially important if the foreign body was left in place in the skin. Check for: More redness, swelling, or pain. More fluid or blood. Warmth. Pus or a bad smell. If the foreign body was in your lip, you may be directed to rinse your mouth with a salt water mixture 3-4 times per day or as needed. To make salt water, completely dissolve -1 tsp (3-6 g) of salt in 1 cup (237 mL)  of warm water. General instructions Take over-the-counter and prescription medicines only as told by your health care provider. If you were prescribed an antibiotic medicine or ointment, use it as told by your health care provider. Do not stop using the antibiotic even if you start to feel better. Keep all follow-up visits. This is important. Contact a health care provider if: You develop more pain or other new symptoms around the area where the object entered the skin. You have more redness,  swelling, or pain around your wound or incision. You have more fluid or blood coming from your wound or incision. Your wound or incision feels warm to the touch. You have pus or a bad smell coming from your wound or incision. You have a fever. Get help right away if: You have severe pain that does not get better with medicine. Summary A skin foreign body is an object that is stuck in the skin. Common objects that get stuck in the skin include wood, glass, rocks, thorns, and fiberglass slivers. Treatment for this condition depends on what the foreign body is, where it is, and whether it is causing infection or other symptoms. Treatment may include removing the foreign body or leaving it in place. It is important to watch the wound or incision for signs of infection, especially if the object was left in place in the skin. This information is not intended to replace advice given to you by your health care provider. Make sure you discuss any questions you have with your health care provider. Document Revised: 07/18/2021 Document Reviewed: 07/18/2021 Elsevier Patient Education  2024 [employer].

## 2024-07-27 NOTE — Progress Notes (Signed)
 [facility] POST-OP OFFICE VISIT  07/27/2024  HPI: [name] is a 58 y.o. female had surgery on July 19, 2024, now s/p incision and drainage of recurring incisional abscess secondary to permanent suture infection.  Along with removal of anal condyloma.  No issues, tolerating Keflex  well.  Minimal spot of drainage on dressing, wicking came out with initial dressing change.  Culture positive for Staph aureus, completely sensitive.      Vital signs: BP (!) 146/84   Pulse 71   Temp 98.4 F (36.9 C) (Oral)   Ht 5' 6.5 (1.689 m)   Wt 176 lb 3.2 oz (79.9 kg)   LMP 07/11/2016 (Exact Date)   SpO2 96%   BMI 28.01 kg/m    Physical Exam: Constitutional: Appears well Abdomen: Soft benign nontender. Skin: Remove the remaining 3 staples.  There is no evidence of active discharge, tunneling or undermining.  The expected thickening for a scar such as this without erythema or calor.  Assessment/Plan: This is a 58 y.o. female as noted above, progressing well.  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   Paroxysmal atrial fibrillation with RVR (HCC) 09/29/2023   Open abdominal wall wound 09/29/2023   Hypokalemia 09/29/2023   Near syncope 09/29/2023   Left lower quadrant abdominal pain 06/03/2023   Wound dehiscence, surgical, sequela 05/10/2023   Poison ivy 05/03/2023   Overeating 02/22/2023   Abnormal brain MRI 02/22/2023   Tremor 01/11/2023   Abdominal hernia 09/18/2022   Abdominal distention 01/09/2022   Anemia 07/01/2021   Dysuria 07/01/2021   Dyspareunia, female 06/30/2021   Low back pain 12/09/2020   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   Headache 07/05/2020   Osteomalacia  12/27/2019   History of weight loss 11/28/2019   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   Localized osteoporosis without current pathological fracture 06/26/2019   Hypocalcemia 06/26/2019   Secondary hyperparathyroidism (HCC) 06/26/2019   Multinodular goiter 06/26/2019   Dysphagia 05/05/2019   Osteoporosis 05/03/2019   Memory loss 02/13/2019   Elevated liver function tests 10/13/2017   Epigastric abdominal pain 10/13/2017   History of kidney stones 10/13/2017   Hypertension 10/13/2017   Status post bariatric surgery 09/03/2017   Right ureteral calculus 08/31/2017   Hydronephrosis with urinary obstruction due to ureteral calculus 08/31/2017   Nephrolithiasis 08/31/2017   Screen for STD (sexually transmitted disease) 07/26/2017   Sleep apnea 10/06/2016   Candidal intertrigo 09/21/2016   H/O gastric bypass 09/21/2016   Hiatal hernia 04/21/2016   Elevated liver enzymes 09/04/2014   Vitamin D  deficiency 09/03/2014   Routine general medical examination at a health care facility 01/11/2014   T2DM (type 2 diabetes mellitus) (HCC) 01/11/2014    - Follow-up as needed.   Honor Leghorn M.D., FACS 07/27/2024, 9:17 AM

## 2024-07-27 NOTE — Telephone Encounter (Signed)
 With your history of atrial fibrillation would just recommend that he avoid stimulant pills such as phentermine. There are other options that are not stimulants that would be fine.

## 2024-07-31 ENCOUNTER — Telehealth: Payer: Self-pay

## 2024-07-31 NOTE — Telephone Encounter (Signed)
 VOB re-submitted due to recent policy changes.

## 2024-07-31 NOTE — Telephone Encounter (Signed)
 Called patient to review provider response to med question - left message on voicemail for patient to return call to the office   Tylene Lunch, NP, response :  With your history of atrial fibrillation would just recommend that he avoid stimulant pills such as phentermine. There are other options that are not stimulants that would be fine.

## 2024-07-31 NOTE — Telephone Encounter (Signed)
 Pt returned call. She states she will view mychart message

## 2024-08-04 ENCOUNTER — Inpatient Hospital Stay: Admitting: Oncology

## 2024-08-04 ENCOUNTER — Inpatient Hospital Stay

## 2024-08-04 ENCOUNTER — Encounter: Payer: Self-pay | Admitting: Oncology

## 2024-08-04 VITALS — BP 141/92 | HR 58 | Temp 95.1°F | Resp 19

## 2024-08-04 VITALS — BP 147/93 | HR 57 | Temp 96.4°F | Resp 16 | Wt 175.0 lb

## 2024-08-04 DIAGNOSIS — D509 Iron deficiency anemia, unspecified: Secondary | ICD-10-CM | POA: Diagnosis not present

## 2024-08-04 DIAGNOSIS — D508 Other iron deficiency anemias: Secondary | ICD-10-CM | POA: Diagnosis not present

## 2024-08-04 DIAGNOSIS — Z9884 Bariatric surgery status: Secondary | ICD-10-CM

## 2024-08-04 MED ORDER — SODIUM CHLORIDE 0.9% FLUSH
10.0000 mL | Freq: Once | INTRAVENOUS | Status: AC | PRN
  Administered 2024-08-04: 10 mL
  Filled 2024-08-04: qty 10

## 2024-08-04 MED ORDER — IRON SUCROSE 20 MG/ML IV SOLN
200.0000 mg | Freq: Once | INTRAVENOUS | Status: AC
  Administered 2024-08-04: 200 mg via INTRAVENOUS
  Filled 2024-08-04: qty 10

## 2024-08-04 NOTE — Assessment & Plan Note (Signed)
 Check B12 and Foate

## 2024-08-04 NOTE — Progress Notes (Signed)
 Hematology/Oncology Progress note [phone] [phone]       Patient Care Team: Dineen Rollene MATSU, FNP as PCP - General (Family Medicine) Perla Evalene PARAS, MD as PCP - Cardiology (Cardiology) Dellie Louanne MATSU, MD (General Surgery) Marylynn Verneita CROME, MD (Internal Medicine) Babara Call, MD as Consulting Physician (Hematology and Oncology)   REFERRING PROVIDER: Dineen Rollene MATSU, FNP  CHIEF COMPLAINTS/REASON FOR VISIT:  Iron  deficiency Anemia  ASSESSMENT & PLAN:  IDA (iron  deficiency anemia) Labs are reviewed and discussed with patient. Lab Results  Component Value Date   HGB 12.2 07/17/2024   TIBC 364 07/17/2024   IRONPCTSAT 15 07/17/2024   FERRITIN 29 07/17/2024    Recommend Venofer  maintenance dose 200mg  x 1  Status post bariatric surgery Check B12 and Foate  Orders Placed This Encounter  Procedures   CBC with Differential (Cancer Center Only)    Standing Status:   Future    Expected Date:   02/01/2025    Expiration Date:   05/02/2025   Iron  and TIBC    Standing Status:   Future    Expected Date:   02/01/2025    Expiration Date:   05/02/2025   Vitamin B12    Standing Status:   Future    Expected Date:   02/01/2025    Expiration Date:   05/02/2025   Folate    Standing Status:   Future    Expected Date:   02/01/2025    Expiration Date:   05/02/2025   Ferritin    Standing Status:   Future    Expected Date:   02/01/2025    Expiration Date:   05/02/2025    All questions were answered. The patient knows to call the clinic with any problems, questions or concerns.  Call Babara, MD, PhD [facility] Hematology Oncology 08/04/2024     HISTORY OF PRESENTING ILLNESS:  [name] is a  58 y.o.  female with PMH listed below who was referred to me for anemia Reviewed patient's recent labs that was done.  03/15/2024 cbc showed Hb 10.3, ferritin 8, iron  saturation 5 Reviewed patient's previous labs ordered by primary care physician's  office, anemia is chronic onset , duration is since 2020  She denies recent chest pain on exertion, pre-syncopal episodes, or palpitations + fatigue  She had not noticed any recent bleeding such as epistaxis, hematuria or hematochezia.  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 She is on Eliquis  since Nov 2024 for A fib. She takes bariatric vitamins plus chewable iron , herbal blood builder. Craves for ice chips  INTERVAL HISTORY [name] is a 58 y.o. female who has above history reviewed by me today presents for follow up visit for iron  deficiency anemia  Patient reports some fatigue.  No bleeding events.  Previously tolerated IV Venofer .   MEDICAL HISTORY:  Past Medical History:  Diagnosis Date   Abdominal wall seroma    Asthma due to seasonal allergies    Bradycardia    Diastolic dysfunction    Dysplasia of cervix, low grade (CIN 1)    GERD (gastroesophageal reflux disease)    Hiatal hernia    a.) s/p repair concurrently with biliopancreatic diversion with duodenal switch procedure in 2017   History of diabetes mellitus, type II    a.) resolved after bariatric  surgery done in 2017   History of hiatal hernia s/p repair    History of kidney stones    History of obstructive sleep apnea    a.) resolved after bariatric surgery done in 2017; has had repeat PSG testing that was negative since surgery   HTN (hypertension)    Hyperlipidemia    Iron  deficiency anemia    MRSA carrier    Multinodular goiter    NAFL (nonalcoholic fatty liver)    Nephrolithiasis    New onset atrial fibrillation (HCC) 09/29/2023   a.) Dx'd 09/2023; b.) CHA2DS2VASc = 3 (sex, HTN,  T2DM) as of 01/24/2024; b.) rate/rhythm maintained intrinsically without need for pharmacological intervention; chronically  anticoagulated with apixaban    On apixaban  therapy    Osteoporosis    Primary biliary cirrhosis (HCC)    Skin cancer of nose    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 Wolm @[facility] , [place]   BRACHIOPLASTY Bilateral    BREAST BIOPSY Right 2017   FIBROCYSTIC CHANGES WITH CALCIFICATIONS   CESAREAN SECTION     x 2   COLONOSCOPY WITH PROPOFOL  N/A 09/11/2019   Procedure: COLONOSCOPY WITH PROPOFOL ;  Surgeon: Therisa Bi, MD;  Location: [facility];  Service: Gastroenterology;  Laterality: N/A;   CONDYLOMA EXCISION/FULGURATION N/A 07/19/2024   Procedure: REMOVAL, CONDYLOMA;  Surgeon: Lane Shope, MD;  Location: [facility];  Service: General;  Laterality: N/A;   CYSTOSCOPY WITH RETROGRADE PYELOGRAM, URETEROSCOPY AND STENT PLACEMENT Right 02/06/2022   CYSTOSCOPY WITH STENT PLACEMENT Right 09/10/2017   Procedure: CYSTOSCOPY WITH STENT PLACEMENT;  Surgeon: Twylla Glendia BROCKS, MD;  Location: [facility];  Service: Urology;  Laterality: Right;   CYSTOSCOPY/URETEROSCOPY/HOLMIUM LASER/STENT PLACEMENT Right 04/24/2019   Procedure: CYSTOSCOPY/URETEROSCOPY/STENT PLACEMENT/ RIGHT RETROGRADE;  Surgeon: Nieves Cough, MD;  Location: [facility];  Service: Urology;  Laterality: Right;   DX LAPAROSCOPY/  EGD/  HIATAL HERNIA REPAIR CLOSURE OF INTERNAL MENSENTRY DEFECT  10/28/2017   @[facility] , [place]   EXTRACORPOREAL SHOCK WAVE LITHOTRIPSY N/A 01/30/2019   Procedure:  EXTRACORPOREAL SHOCK WAVE LITHOTRIPSY (ESWL) LEFT/ POSSIBLE RIGHT;  Surgeon: Nieves Cough, MD;  Location: [facility];  Service: Urology;  Laterality: N/A;   EXTRACORPOREAL SHOCK WAVE LITHOTRIPSY Right 03/20/2019   Procedure: EXTRACORPOREAL SHOCK WAVE LITHOTRIPSY (ESWL);  Surgeon: Matilda Senior, MD;  Location: [facility];  Service: Urology;  Laterality: Right;   EXTRACORPOREAL SHOCK WAVE LITHOTRIPSY Left 10/30/2021   Procedure: EXTRACORPOREAL SHOCK WAVE LITHOTRIPSY (ESWL);  Surgeon: Cam Morene ORN, MD;  Location: [facility];  Service: Urology;  Laterality: Left;   FOREIGN BODY REMOVAL ABDOMINAL N/A 07/19/2024   Procedure: REMOVAL, FOREIGN BODY, ABDOMEN;  Surgeon: Lane Shope, MD;  Location: [facility];  Service: General;  Laterality: N/A;   INCISION AND DRAINAGE ABSCESS N/A 07/19/2024   Procedure: INCISION AND DRAINAGE, ABSCESS;  Surgeon: Lane Shope, MD;  Location: [facility];  Service: General;  Laterality: N/A;  Incision and drainage of abdominal wall abscess   LAPAROSCOPIC CHOLECYSTECTOMY  10/15/2016   @[facility]  , [place]  LAPAROTOMY N/A 01/26/2024   Procedure: LAPAROTOMY, EXPLORATORY of abdominal wall wounds, for removal of foreign body; ultrasound-guided aspiratio of seroma;  Surgeon: Lane Shope, MD;  Location: [facility];  Service: General;  Laterality: N/A;   LAPAROTOMY N/A 03/29/2024   Procedure: LAPAROTOMY, EXPLORATORY;  Surgeon: Lane Shope, MD;  Location: [facility];  Service: General;  Laterality: N/A;  Abdominal wall, drain/resection seroma cavity   LAPAROTOMY N/A 07/19/2024   Procedure: LAPAROTOMY, EXPLORATORY;  Surgeon: Lane Shope, MD;  Location: [facility];  Service: General;  Laterality: N/A;  VIA PREVIOUS LAPAROTOMY INCISION   LIPOSUCTION     PANNICULECTOMY     2022   TONSILLECTOMY AND ADENOIDECTOMY  child   URETEROSCOPY WITH HOLMIUM LASER LITHOTRIPSY Right 09/10/2017   Procedure: URETEROSCOPY WITH HOLMIUM LASER LITHOTRIPSY;  Surgeon: Twylla Glendia BROCKS, MD;  Location: [facility];  Service: Urology;  Laterality: Right;    SOCIAL HISTORY: Social History   Socioeconomic History   Marital status: Single    Spouse name: Not on file   Number of children: 2   Years of education: 14   Highest education level: Not on file  Occupational History   Occupation: Microbiology Supervisor    Employer: [employer]   Occupation: Teaches for [employer]     Comment: Through [facility]   Occupation: Teaches microbiology   Occupation: Technical brewer    Comment: Microbiology - Weekend  Tobacco Use   Smoking status: Never    Passive exposure: Never   Smokeless tobacco: Never  Vaping Use   Vaping status: Never Used  Substance and Sexual Activity   Alcohol use: No    Alcohol/week: 0.0 standard drinks of alcohol   Drug use: Never   Sexual activity: Not Currently    Birth control/protection: Post-menopausal  Other Topics Concern   Not on file  Social History Narrative   Idell Caldron grew up partly in [place]  and then [place] . She lives in [place] with her two daughter. 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 She enjoys the outdoors, gardening.    Right handed   Two story home   Takes caffeine      Microbiologist      Social Drivers of Health   Financial Resource Strain: Low Risk  (04/12/2024)   Overall Financial Resource Strain (CARDIA)    Difficulty of Paying Living Expenses: Not hard at all  Food Insecurity: No Food Insecurity (04/12/2024)   Hunger Vital Sign    Worried About Running Out of Food in the Last Year: Never true    Ran Out of Food in the Last Year: Never true  Transportation Needs: No Transportation Needs (04/12/2024)   PRAPARE - Administrator, Civil Service (Medical): No    Lack of Transportation (Non-Medical): No  Physical Activity: Not on file  Stress: Not on file  Social Connections: Not on file  Intimate Partner Violence: Not At Risk (04/12/2024)   Humiliation, Afraid, Rape, and Kick  questionnaire    Fear of Current or Ex-Partner: No    Emotionally Abused: No    Physically Abused: No    Sexually Abused: No    FAMILY HISTORY: Family History  Problem Relation Age of Onset   Seizures Mother    Alcohol abuse Mother    Hyperlipidemia Mother    Hypertension Mother    Lymphoma Mother        primary site : brain   Leukemia Mother    Parkinsonism Father    Hyperlipidemia Father  Hypertension Father    Parkinson's disease Father    Drug abuse Sister    Cancer Maternal Grandmother        breast cancer   Breast cancer Maternal Grandmother 38   Liver cancer Paternal Grandfather    Kidney cancer Neg Hx    Prostate cancer Neg Hx     ALLERGIES:  is allergic to ivp dye [iodinated contrast media], latex, tolmetin, gallium nitrate, insulin  detemir, nsaids, and gadolinium derivatives.  MEDICATIONS:  Current Outpatient Medications  Medication Sig Dispense Refill   amLODipine  (NORVASC ) 5 MG tablet Take 1 tablet (5 mg total) by mouth as directed. Take 1 whole tablet (5 mg) in the morning AND Take half a tablet (2.5 mg) at bedtime. 135 tablet 3   apixaban  (ELIQUIS ) 5 MG TABS tablet Take 1 tablet (5 mg total) by mouth 2 (two) times daily. 60 tablet 11   cephALEXin  (KEFLEX ) 500 MG capsule Take 1 capsule (500 mg total) by mouth 4 (four) times daily. 40 capsule 0   UNABLE TO FIND Take 2 capsules by mouth daily. Med Name: bariatric vitamins     ursodiol  (ACTIGALL ) 250 MG tablet Take 500-750 mg by mouth See admin instructions. 750 mg in the morning, 500 mg at bedtime     No current facility-administered medications for this visit.    Review of Systems  Constitutional:  Positive for fatigue. Negative for appetite change, chills and fever.  HENT:   Negative for hearing loss and voice change.   Eyes:  Negative for eye problems.  Respiratory:  Negative for chest tightness and cough.   Cardiovascular:  Negative for chest pain.  Gastrointestinal:  Negative for abdominal  distention, abdominal pain and blood in stool.  Endocrine: Negative for hot flashes.  Genitourinary:  Negative for difficulty urinating and frequency.   Musculoskeletal:  Negative for arthralgias.  Skin:  Negative for itching and rash.  Neurological:  Negative for extremity weakness.  Hematological:  Negative for adenopathy.  Psychiatric/Behavioral:  Negative for confusion.     PHYSICAL EXAMINATION: Vitals:   08/04/24 0949  BP: (!) 147/93  Pulse: (!) 57  Resp: 16  Temp: (!) 96.4 F (35.8 C)  SpO2: 100%   Filed Weights   08/04/24 0949  Weight: 175 lb (79.4 kg)    Physical Exam Constitutional:      General: She is not in acute distress. HENT:     Head: Normocephalic and atraumatic.  Eyes:     General: No scleral icterus. Cardiovascular:     Rate and Rhythm: Normal rate and regular rhythm.     Heart sounds: Normal heart sounds.  Pulmonary:     Effort: Pulmonary effort is normal. No respiratory distress.     Breath sounds: No wheezing.  Abdominal:     General: Bowel sounds are normal. There is no distension.     Palpations: Abdomen is soft.  Musculoskeletal:        General: No deformity. Normal range of motion.     Cervical back: Normal range of motion and neck supple.  Skin:    General: Skin is warm and dry.     Findings: No erythema or rash.  Neurological:     Mental Status: She is alert and oriented to person, place, and time. Mental status is at baseline.     Cranial Nerves: No cranial nerve deficit.     Coordination: Coordination normal.  Psychiatric:        Mood and Affect: Mood normal.  LABORATORY DATA:  I have reviewed the data as listed    Latest Ref Rng & Units 07/17/2024    8:10 AM 04/12/2024    3:46 PM 03/15/2024    9:37 AM  CBC  WBC 4.0 - 10.5 K/uL 4.5  3.9  5.9   Hemoglobin 12.0 - 15.0 g/dL 87.7  89.5  89.6   Hematocrit 36.0 - 46.0 % 38.1  34.8  33.4   Platelets 150 - 400 K/uL 187  204  256       Latest Ref Rng & Units 06/21/2024    7:05  AM 02/22/2024    7:18 AM 12/15/2023    2:23 PM  CMP  Glucose 70 - 99 mg/dL 74  73  85   BUN 6 - 24 mg/dL 13  21  13    Creatinine 0.57 - 1.00 mg/dL 9.14  9.18  9.31   Sodium 134 - 144 mmol/L 144  143  145   Potassium 3.5 - 5.2 mmol/L 4.3  4.4  4.0   Chloride 96 - 106 mmol/L 109  108  110   CO2 20 - 29 mmol/L 21  20  23    Calcium  8.7 - 10.2 mg/dL 8.7  9.3  8.7   Total Protein 6.0 - 8.5 g/dL  6.9    Total Bilirubin 0.0 - 1.2 mg/dL  <9.7    Alkaline Phos 44 - 121 IU/L  220    AST 0 - 40 IU/L  25    ALT 0 - 32 IU/L  20     Lab Results  Component Value Date   IRON  55 07/17/2024   TIBC 364 07/17/2024   IRONPCTSAT 15 07/17/2024   FERRITIN 29 07/17/2024     RADIOGRAPHIC STUDIES: I have personally reviewed the radiological images as listed and agreed with the findings in the report. No results found.

## 2024-08-04 NOTE — Assessment & Plan Note (Addendum)
 Labs are reviewed and discussed with patient. Lab Results  Component Value Date   HGB 12.2 07/17/2024   TIBC 364 07/17/2024   IRONPCTSAT 15 07/17/2024   FERRITIN 29 07/17/2024    Recommend Venofer  maintenance dose 200mg  x 1

## 2024-08-08 NOTE — Telephone Encounter (Signed)
 [facility]  Patient is ready for scheduling on or after 08/08/24  Out-of-pocket cost due at time of visit: $0  Primary: C[employer] Open Access Plus Prolia  co-insurance: 0% Admin fee co-insurance: 0%  Deductible: does not apply  OOP max of $6,000 has been satisfied  Prior Auth: APPROVED PA# [id number] Valid: 07/15/24-07/14/25  Secondary: N/A Prolia  co-insurance:  Admin fee co-insurance:  Deductible:  Prior Auth:  PA# Valid:   ** This summary of benefits is an estimation of the patient's out-of-pocket cost. Exact cost may vary based on individual plan coverage.

## 2024-08-11 ENCOUNTER — Telehealth: Payer: Self-pay | Admitting: Dietician

## 2024-08-11 NOTE — Telephone Encounter (Signed)
 Patient called our office returning a call.  There is no documentation regarding the reason for her missed call.  Will forward to the RMA.  Leita Constable, RD, LDN, CDCES, DipACLM

## 2024-08-15 ENCOUNTER — Ambulatory Visit
Admission: RE | Admit: 2024-08-15 | Discharge: 2024-08-15 | Disposition: A | Discharge status: Home / Self Care | Source: Ambulatory Visit | Attending: Family | Admitting: Family

## 2024-08-15 DIAGNOSIS — Z1231 Encounter for screening mammogram for malignant neoplasm of breast: Secondary | ICD-10-CM | POA: Diagnosis present

## 2024-09-01 ENCOUNTER — Ambulatory Visit

## 2024-09-05 NOTE — Telephone Encounter (Signed)
 Appointment 09/01/24 canceled.

## 2024-09-07 NOTE — Telephone Encounter (Signed)
 Pt 30 days PAST DUE for PROLIA  injection.   If you would like for pt to continue with Prolia  therapy, please have clinical staff reach out to pt for scheduling and to explain to importance of receiving Prolia  injections every 6 months as abrupt cessation of Prolia  raises risk of osteoporotic fracture.    Discontinuation of Dmab is associated with a 3- to 5-fold higher risk for vertebral, major osteoporotic, and hip fractures [38,39].   [email or web address]

## 2024-09-19 ENCOUNTER — Ambulatory Visit: Attending: Cardiology

## 2024-09-19 DIAGNOSIS — I480 Paroxysmal atrial fibrillation: Secondary | ICD-10-CM

## 2024-10-02 ENCOUNTER — Encounter: Payer: Self-pay | Admitting: Family

## 2024-10-02 ENCOUNTER — Other Ambulatory Visit (HOSPITAL_COMMUNITY)
Admission: RE | Admit: 2024-10-02 | Discharge: 2024-10-02 | Disposition: A | Source: Ambulatory Visit | Attending: Family | Admitting: Family

## 2024-10-02 ENCOUNTER — Telehealth: Payer: Self-pay | Admitting: Family

## 2024-10-02 ENCOUNTER — Ambulatory Visit (INDEPENDENT_AMBULATORY_CARE_PROVIDER_SITE_OTHER): Admitting: Family

## 2024-10-02 VITALS — BP 132/76 | HR 70 | Temp 98.5°F | Ht 66.5 in | Wt 170.4 lb

## 2024-10-02 DIAGNOSIS — Z124 Encounter for screening for malignant neoplasm of cervix: Secondary | ICD-10-CM | POA: Insufficient documentation

## 2024-10-02 DIAGNOSIS — Z0001 Encounter for general adult medical examination with abnormal findings: Secondary | ICD-10-CM | POA: Diagnosis not present

## 2024-10-02 DIAGNOSIS — M810 Age-related osteoporosis without current pathological fracture: Secondary | ICD-10-CM | POA: Diagnosis not present

## 2024-10-02 DIAGNOSIS — Z1211 Encounter for screening for malignant neoplasm of colon: Secondary | ICD-10-CM

## 2024-10-02 DIAGNOSIS — A630 Anogenital (venereal) warts: Secondary | ICD-10-CM

## 2024-10-02 DIAGNOSIS — Z0000 Encounter for general adult medical examination without abnormal findings: Secondary | ICD-10-CM

## 2024-10-02 NOTE — Assessment & Plan Note (Signed)
 H/o CIN-1 cervix.  Aptima swab of rectum to screen for HPV.

## 2024-10-02 NOTE — Assessment & Plan Note (Addendum)
 Clinical breast exam performed.  Pap smear obtained.  Patient will schedule dexa.

## 2024-10-02 NOTE — Telephone Encounter (Signed)
 we need to call pt and let her know swab for rectal HPV was incorrect. Please apologize for this.     Does she want to reschedule visit here?

## 2024-10-02 NOTE — Progress Notes (Signed)
 Assessment & Plan:  Annual physical exam Assessment & Plan: Clinical breast exam performed.  Pap smear obtained.  Patient will schedule dexa.   Orders: -     DG Bone Density; Future -     VITAMIN D  25 Hydroxy (Vit-D Deficiency, Fractures) -     CBC with Differential/Platelet -     Comprehensive metabolic panel with GFR -     Hemoglobin A1c -     Lipid panel -     TSH  Screening for cervical cancer -     Cytology - PAP  Screening for colon cancer -     Ambulatory referral to Gastroenterology  Osteoporosis, unspecified osteoporosis type, unspecified pathological fracture presence Assessment & Plan: History of osteoporosis.  She did not start Prolia .  She is not currently following endocrinology.  Pending bone density.   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  Aptima swab of rectum to screen for HPV.   Orders: -     IGP, Aptima HPV     Return precautions given.   Risks, benefits, and alternatives of the medications and treatment plan prescribed today were discussed, and patient expressed understanding.   Education regarding symptom management and diagnosis given to patient on AVS either electronically or printed.  Return in about 6 months (around 04/01/2025).  Rollene Northern, FNP  Subjective:    Patient ID: [name], female    DOB: January 26, 1966, 58 y.o.   MRN: [id number]  CC: [name] is a 58 y.o. female who presents today for physical exam.    HPI: Overall is well today. She inquires about history of cervical HPV in light of the fact that she recently had colorectal surgery consult with Dr. Debby in regards to anal condyloma. She would like to be screened for HPV.   She reports that general surgery Dr. Lane removed anal condyloma. She doesn't have rectal intercourse.She is not currently sexually active.    She had consult 06/2024 Bernarda JAYSON Debby, MD, Colon and Rectal Surgery    Surgical pathology 07/19/2024  1.  Condyloma,  :       - FRAGMENTS OF CONDYLOMA.    She is not taking amlodipine , or apixaban    following with cardiology for paroxysmal atrial fibrillation with RVR, last seen 11/24/2023 Pending Zio monitor .  She has stopped apixaban  prior to cardiology appointment  following with Dr. Babara for iron  deficiency anemia, last seen 08/04/2024.  Recommended Venofer  maintenance dose Colorectal Cancer Screening: due; dr therisa; 09/11/2019, repeat in 5-years Scheduled for colonoscopy 10/09/24  Breast Cancer Screening: Mammogram UTD Cervical Cancer Screening: due; abnormal pap smear; 10/01/23 abnormal pap smear  LSIL, HPV  positive. S/p colposcopy 10/30/22   Dr Janit GYN 11/09/23  Biopsy 10/19/23 CIN1   1.  We have discussed options for persistent CIN-1.  These include continued expectant management with Pap smear and possible colposcopically directed biopsy follow-up versus LEEP.  Risks and benefits of each have been discussed.  Follow-up after each procedure has also been discussed.  All of her questions were answered. At this time she has chosen to have a follow-up Pap smear in 1 year.  I have stressed the importance of this Pap and with adequate follow-up as needed. Orders  Bone Health screening/DEXA for 65+: UTD, 10/2023.  Following with Dr Sam, endocrine.  Lung Cancer Screening: Doesn't have 20 year pack year history and age > 65 years yo 7 years       Tetanus - UTD  Exercise: Gets regular exercise.   Alcohol use:  none Smoking/tobacco use: Nonsmoker.    Health Maintenance  Topic Date Due   Hepatitis B Vaccine (1 of 3 - 19+ 3-dose series) Never done   Complete foot exam   02/25/2017   Eye exam for diabetics  01/28/2024   COVID-19 Vaccine (4 - 2025-26 season) 07/10/2024   Hemoglobin A1C  09/28/2024   Flu Shot  02/06/2025*   Pneumococcal Vaccine for age over 76 (2 of 2 - PCV) 10/02/2025*   Pap Smear  03/28/2025   Yearly kidney health urinalysis for diabetes  03/28/2025    Yearly kidney function blood test for diabetes  06/21/2025   DTaP/Tdap/Td vaccine (2 - Td or Tdap) 02/25/2026   Breast Cancer Screening  08/15/2026   Colon Cancer Screening  10/24/2028   Hepatitis C Screening  Completed   HIV Screening  Completed   Zoster (Shingles) Vaccine  Completed   HPV Vaccine  Aged Out   Meningitis B Vaccine  Aged Out  *Topic was postponed. The date shown is not the original due date.    ALLERGIES: Ivp dye [iodinated contrast media], Latex, Tolmetin, Gallium nitrate, Insulin  detemir, Nsaids, and Gadolinium derivatives  Current Outpatient Medications on File Prior to Visit  Medication Sig Dispense Refill   amLODipine  (NORVASC ) 5 MG tablet Take 1 tablet (5 mg total) by mouth as directed. Take 1 whole tablet (5 mg) in the morning AND Take half a tablet (2.5 mg) at bedtime. (Patient not taking: Reported on 10/02/2024) 135 tablet 3   apixaban  (ELIQUIS ) 5 MG TABS tablet Take 1 tablet (5 mg total) by mouth 2 (two) times daily. (Patient not taking: Reported on 10/02/2024) 60 tablet 11   ursodiol  (ACTIGALL ) 250 MG tablet Take 500-750 mg by mouth See admin instructions. 750 mg in the morning, 500 mg at bedtime (Patient not taking: Reported on 10/02/2024)     No current facility-administered medications on file prior to visit.    Review of Systems  Constitutional:  Negative for chills and fever.  Respiratory:  Negative for cough.   Cardiovascular:  Negative for chest pain and palpitations.  Gastrointestinal:  Negative for nausea and vomiting.  Psychiatric/Behavioral:  Negative for sleep disturbance.       Objective:    BP 132/76   Pulse 70   Temp 98.5 F (36.9 C) (Oral)   Ht 5' 6.5 (1.689 m)   Wt 170 lb 6.4 oz (77.3 kg)   LMP 07/11/2016 (Exact Date)   SpO2 95%   BMI 27.09 kg/m   BP Readings from Last 3 Encounters:  10/02/24 132/76  08/04/24 (!) 141/92  08/04/24 (!) 147/93   Wt Readings from Last 3 Encounters:  10/02/24 170 lb 6.4 oz (77.3 kg)  08/04/24  175 lb (79.4 kg)  07/27/24 176 lb 3.2 oz (79.9 kg)    Physical Exam Vitals reviewed.  Constitutional:      Appearance: Normal appearance. She is well-developed.  Eyes:     Conjunctiva/sclera: Conjunctivae normal.  Neck:     Thyroid : No thyroid  mass or thyromegaly.  Cardiovascular:     Rate and Rhythm: Normal rate and regular rhythm.     Pulses: Normal pulses.     Heart sounds: Normal heart sounds.  Pulmonary:     Effort: Pulmonary effort is normal.     Breath sounds: Normal breath sounds. No wheezing, rhonchi or rales.  Chest:  Breasts:    Breasts are symmetrical.     Right: No inverted nipple,  mass, nipple discharge, skin change or tenderness.     Left: No inverted nipple, mass, nipple discharge, skin change or tenderness.  Abdominal:     General: Bowel sounds are normal. There is no distension.     Palpations: Abdomen is soft. Abdomen is not rigid. There is no fluid wave or mass.     Tenderness: There is no abdominal tenderness. There is no guarding or rebound.  Genitourinary:    Cervix: No cervical motion tenderness, discharge or friability.     Uterus: Not enlarged, not fixed and not tender.      Adnexa:        Right: No mass, tenderness or fullness.         Left: No mass, tenderness or fullness.       Comments: Pap performed. No CMT. Unable to appreciate ovaries. No rectal lesions on exam. Rectal swab obtained.  Lymphadenopathy:     Head:     Right side of head: No submental, submandibular, tonsillar, preauricular, posterior auricular or occipital adenopathy.     Left side of head: No submental, submandibular, tonsillar, preauricular, posterior auricular or occipital adenopathy.     Cervical:     Right cervical: No superficial, deep or posterior cervical adenopathy.    Left cervical: No superficial, deep or posterior cervical adenopathy.     Upper Body:     Right upper body: No pectoral adenopathy.     Left upper body: No pectoral adenopathy.  Skin:    General:  Skin is warm and dry.  Neurological:     Mental Status: She is alert.  Psychiatric:        Speech: Speech normal.        Behavior: Behavior normal.        Thought Content: Thought content normal.

## 2024-10-02 NOTE — Assessment & Plan Note (Signed)
 History of osteoporosis.  She did not start Prolia .  She is not currently following endocrinology.  Pending bone density.

## 2024-10-02 NOTE — Patient Instructions (Addendum)
   Labs with [facility].   Nice to see you!

## 2024-10-03 NOTE — Telephone Encounter (Signed)
 noted

## 2024-10-03 NOTE — Telephone Encounter (Signed)
 Spoke to [facility], they did accept the specimen and sent correct way for test to be ordered

## 2024-10-04 LAB — CYTOLOGY - PAP
Comment: NEGATIVE
High risk HPV: POSITIVE — AB

## 2024-10-16 ENCOUNTER — Ambulatory Visit: Payer: Self-pay | Admitting: Cardiology

## 2024-10-16 DIAGNOSIS — I480 Paroxysmal atrial fibrillation: Secondary | ICD-10-CM

## 2024-10-16 NOTE — Progress Notes (Signed)
 Average heart rate of 70 beats per minutes, no atrial fibrillation recorded.

## 2024-10-17 ENCOUNTER — Ambulatory Visit: Payer: Self-pay | Admitting: Family

## 2024-10-17 NOTE — Telephone Encounter (Signed)
 I do not have results for rectal HPV swab collected during her physical.  Can you check on this?

## 2024-10-18 ENCOUNTER — Encounter: Payer: Self-pay | Admitting: Family

## 2024-10-18 NOTE — Telephone Encounter (Signed)
 Noted Sent mychart note

## 2024-10-19 NOTE — Progress Notes (Signed)
 Referring Provider:  Rollene Northern, FNP  HPI:  [name] is a 58 y.o.  [phone]  who presents today for evaluation and management of abnormal cervical cytology.    Prior pap smears:  10/02/24:  LSIL HPV + (undiff) 03/28/24:  EPCA, ASUSB, HPV + (undiff) 10/01/23:  LSIL HPV + (undiff) 09/18/22:  NILM HPV + (undiff) 06/30/22:  NILM HPV negative   Prior cervical / vaginal findings: Colposcopy 10/19/23:  6:00 NEG; ECC CIN1 10/30/22:  7:00 CIN1; ECC benign    Prior cervical treatment(s): None  Symptoms/History:  -Abnormal vaginal discharge: No -Postmenopausal: Yes -Intermenstrual bleeding: No -Postcoital bleeding: YES -Bleeding problems (non-gyn): No -Contraception: Postmenopausal -Number of current sexual partners: 1 -Number of partners in lifetime: 62 -History of a high risk partner: No -History of STDs: Chlamydia -Smoking: No -Gardasil Vaccine: No      ROS:  Pertinent items are noted in HPI.  OB History  Gravida Para Term Preterm AB Living  5 2 2  3 2   SAB IAB Ectopic Multiple Live Births  2 1   2     # Outcome Date GA Lbr Len/2nd Weight Sex Type Anes PTL Lv  5 Term 2001   8 lb 11 oz (3.941 kg) F CS-Unspec   LIV  4 Term 1992   6 lb 7 oz (2.92 kg) F CS-Unspec   LIV  3 IAB 1988          2 SAB           1 SAB             Obstetric Comments  1st Menstrual Cycle:  ?  1st Pregnancy:  21    Past Medical History:  Diagnosis Date   Abdominal wall seroma    Asthma due to seasonal allergies    Bradycardia    Diastolic dysfunction    Dysplasia of cervix, low grade (CIN 1)    GERD (gastroesophageal reflux disease)    Hiatal hernia    a.) s/p repair concurrently with biliopancreatic diversion with duodenal switch procedure in 2017   History of diabetes mellitus, type II    a.) resolved after bariatric surgery done in 2017   History of hiatal hernia s/p repair    History of kidney stones    History of obstructive sleep apnea    a.) resolved after  bariatric surgery done in 2017; has had repeat PSG testing that was negative since surgery   HTN (hypertension)    Hyperlipidemia    Iron  deficiency anemia    MRSA carrier    Multinodular goiter    NAFL (nonalcoholic fatty liver)    Nephrolithiasis    New onset atrial fibrillation (HCC) 09/29/2023   a.) Dx'd 09/2023; b.) CHA2DS2VASc = 3 (sex, HTN,  T2DM) as of 01/24/2024; b.) rate/rhythm maintained intrinsically without need for pharmacological intervention; chronically anticoagulated with apixaban    On apixaban  therapy    Osteoporosis    Primary biliary cirrhosis (HCC)    Skin cancer of nose    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 Wolm @[facility] , [place]   BRACHIOPLASTY Bilateral    BREAST BIOPSY Right 2017   FIBROCYSTIC CHANGES WITH CALCIFICATIONS   CESAREAN SECTION     x 2   COLONOSCOPY WITH PROPOFOL  N/A 09/11/2019   Procedure: COLONOSCOPY WITH PROPOFOL ;  Surgeon: Therisa Bi, MD;  Location: [facility];  Service: Gastroenterology;  Laterality: N/A;   CONDYLOMA EXCISION/FULGURATION N/A 07/19/2024   Procedure: REMOVAL, CONDYLOMA;  Surgeon: Lane Shope, MD;  Location: [facility];  Service: General;  Laterality: N/A;   CYSTOSCOPY WITH RETROGRADE PYELOGRAM, URETEROSCOPY AND STENT PLACEMENT Right 02/06/2022   CYSTOSCOPY WITH STENT PLACEMENT Right 09/10/2017   Procedure: CYSTOSCOPY WITH STENT PLACEMENT;  Surgeon: Twylla Glendia BROCKS, MD;  Location: [facility];  Service: Urology;   Laterality: Right;   CYSTOSCOPY/URETEROSCOPY/HOLMIUM LASER/STENT PLACEMENT Right 04/24/2019   Procedure: CYSTOSCOPY/URETEROSCOPY/STENT PLACEMENT/ RIGHT RETROGRADE;  Surgeon: Nieves Cough, MD;  Location: [facility];  Service: Urology;  Laterality: Right;   DX LAPAROSCOPY/  EGD/  HIATAL HERNIA REPAIR CLOSURE OF INTERNAL MENSENTRY DEFECT  10/28/2017   @[facility] , [place]   EXTRACORPOREAL SHOCK WAVE LITHOTRIPSY N/A 01/30/2019   Procedure: EXTRACORPOREAL SHOCK WAVE LITHOTRIPSY (ESWL) LEFT/ POSSIBLE RIGHT;  Surgeon: Nieves Cough, MD;  Location: [facility];  Service: Urology;  Laterality: N/A;   EXTRACORPOREAL SHOCK WAVE LITHOTRIPSY Right 03/20/2019   Procedure: EXTRACORPOREAL SHOCK WAVE LITHOTRIPSY (ESWL);  Surgeon: Matilda Senior, MD;  Location: [facility];  Service: Urology;  Laterality: Right;   EXTRACORPOREAL SHOCK WAVE LITHOTRIPSY Left 10/30/2021   Procedure: EXTRACORPOREAL SHOCK WAVE LITHOTRIPSY (ESWL);  Surgeon: Cam Morene ORN, MD;  Location: [facility];  Service: Urology;  Laterality: Left;   FOREIGN BODY REMOVAL ABDOMINAL N/A 07/19/2024   Procedure: REMOVAL, FOREIGN BODY, ABDOMEN;  Surgeon: Lane Shope, MD;  Location: [facility];  Service: General;  Laterality: N/A;   INCISION AND DRAINAGE ABSCESS N/A 07/19/2024   Procedure: INCISION AND DRAINAGE, ABSCESS;  Surgeon: Lane Shope, MD;  Location: [facility];  Service: General;  Laterality: N/A;  Incision and drainage of abdominal wall abscess   LAPAROSCOPIC CHOLECYSTECTOMY  10/15/2016   @[facility]  , [place]   LAPAROTOMY N/A 01/26/2024   Procedure: LAPAROTOMY, EXPLORATORY of abdominal wall wounds, for removal of foreign body; ultrasound-guided aspiratio of seroma;  Surgeon: Lane Shope, MD;  Location: [facility];  Service: General;  Laterality: N/A;   LAPAROTOMY N/A 03/29/2024   Procedure: LAPAROTOMY, EXPLORATORY;  Surgeon: Lane Shope, MD;  Location: [facility];  Service: General;  Laterality: N/A;   Abdominal wall, drain/resection seroma cavity   LAPAROTOMY N/A 07/19/2024   Procedure: LAPAROTOMY, EXPLORATORY;  Surgeon: Lane Shope, MD;  Location: [facility];  Service: General;  Laterality: N/A;  VIA PREVIOUS LAPAROTOMY INCISION   LIPOSUCTION     PANNICULECTOMY     2022   TONSILLECTOMY AND ADENOIDECTOMY  child   URETEROSCOPY WITH HOLMIUM LASER LITHOTRIPSY Right 09/10/2017   Procedure: URETEROSCOPY WITH HOLMIUM LASER LITHOTRIPSY;  Surgeon: Twylla Glendia BROCKS, MD;  Location: [facility];  Service: Urology;  Laterality: Right;    SOCIAL HISTORY:  Social History   Substance and Sexual Activity  Alcohol Use No   Alcohol/week: 0.0 standard drinks of alcohol    Social History   Substance and Sexual Activity  Drug Use Never     Family History  Problem Relation Age of Onset   Seizures Mother    Alcohol abuse Mother    Hyperlipidemia Mother    Hypertension Mother    Lymphoma Mother  primary site : brain   Leukemia Mother    Parkinsonism Father    Hyperlipidemia Father    Hypertension Father    Parkinson's disease Father    Drug abuse Sister    Cancer Maternal Grandmother        breast cancer   Breast cancer Maternal Grandmother 41   Liver cancer Paternal Grandfather    Kidney cancer Neg Hx    Prostate cancer Neg Hx     ALLERGIES:  Ivp dye [iodinated contrast media], Latex, Tolmetin, Gallium nitrate, Insulin  detemir, Nsaids, and Gadolinium derivatives  She has a current medication list which includes the following prescription(s): amlodipine , apixaban , and ursodiol .  Physical Exam: -Vitals:  BP (!) 160/90   Pulse 62   Wt 175 lb 9.6 oz (79.7 kg)   LMP 07/11/2016   BMI 27.92 kg/m   Physical Exam  PROCEDURE: Colposcopy performed with 4% acetic acid and Lugol's after informed consent obtained. Cervical dilation performed due to stenosis.                          -Aceto-white Lesions Location(s): See above              -Biopsy performed at 5 o'clock                -ECC indicated and performed: Yes.  ECC unable to accommodate the curette, but cytobrush did  pass and was used for collection.      -Biopsy sites made hemostatic with pressure and Monsel's solution   -Satisfactory colposcopy: Yes.      -Evidence of Invasive cervical CA :  NO  ASSESSMENT:  [name] is a 58 y.o. [phone] with LSIL and HPV-HR positive (undiff) on recent pap (10/02/24), here for colposcopy today, performed as above without complications.  -ECC and 1 cervical bx sent to pathology -Aftercare instructions for home reviewed, si/sx of when to call/return discussed. -Discussed possible outcomes based on pathology; did discuss excision/LEEP if pathology positive as pt continues to need colposcopies yearly, she would rather continue pap > colpo > pap at this time. Will call with results   Estil Mangle, DO [facility]

## 2024-10-24 ENCOUNTER — Ambulatory Visit: Admitting: Cardiology

## 2024-10-24 ENCOUNTER — Other Ambulatory Visit (HOSPITAL_COMMUNITY)
Admission: RE | Admit: 2024-10-24 | Discharge: 2024-10-24 | Disposition: A | Source: Ambulatory Visit | Attending: Obstetrics | Admitting: Obstetrics

## 2024-10-24 ENCOUNTER — Encounter: Payer: Self-pay | Admitting: Obstetrics

## 2024-10-24 ENCOUNTER — Ambulatory Visit: Admitting: Obstetrics

## 2024-10-24 VITALS — BP 160/90 | HR 62 | Wt 175.6 lb

## 2024-10-24 DIAGNOSIS — N870 Mild cervical dysplasia: Secondary | ICD-10-CM | POA: Diagnosis not present

## 2024-10-24 DIAGNOSIS — R87622 Low grade squamous intraepithelial lesion on cytologic smear of vagina (LGSIL): Secondary | ICD-10-CM | POA: Insufficient documentation

## 2024-10-24 DIAGNOSIS — N882 Stricture and stenosis of cervix uteri: Secondary | ICD-10-CM | POA: Insufficient documentation

## 2024-10-24 DIAGNOSIS — R8789 Other abnormal findings in specimens from female genital organs: Secondary | ICD-10-CM | POA: Insufficient documentation

## 2024-10-25 LAB — CBC WITH DIFFERENTIAL/PLATELET
Basophils Absolute: 0 x10E3/uL (ref 0.0–0.2)
Basos: 1 %
EOS (ABSOLUTE): 0.5 x10E3/uL — ABNORMAL HIGH (ref 0.0–0.4)
Eos: 10 %
Hematocrit: 40.5 % (ref 34.0–46.6)
Hemoglobin: 13.1 g/dL (ref 11.1–15.9)
Immature Grans (Abs): 0 x10E3/uL (ref 0.0–0.1)
Immature Granulocytes: 0 %
Lymphocytes Absolute: 2.4 x10E3/uL (ref 0.7–3.1)
Lymphs: 45 %
MCH: 30.6 pg (ref 26.6–33.0)
MCHC: 32.3 g/dL (ref 31.5–35.7)
MCV: 95 fL (ref 79–97)
Monocytes Absolute: 0.4 x10E3/uL (ref 0.1–0.9)
Monocytes: 8 %
Neutrophils Absolute: 1.9 x10E3/uL (ref 1.4–7.0)
Neutrophils: 36 %
Platelets: 194 x10E3/uL (ref 150–450)
RBC: 4.28 x10E6/uL (ref 3.77–5.28)
RDW: 13 % (ref 11.7–15.4)
WBC: 5.3 x10E3/uL (ref 3.4–10.8)

## 2024-10-25 LAB — COMPREHENSIVE METABOLIC PANEL WITH GFR
ALT: 44 IU/L — ABNORMAL HIGH (ref 0–32)
AST: 46 IU/L — ABNORMAL HIGH (ref 0–40)
Albumin: 3.9 g/dL (ref 3.8–4.9)
Alkaline Phosphatase: 211 IU/L — ABNORMAL HIGH (ref 49–135)
BUN/Creatinine Ratio: 26 — ABNORMAL HIGH (ref 9–23)
BUN: 16 mg/dL (ref 6–24)
Bilirubin Total: 0.4 mg/dL (ref 0.0–1.2)
CO2: 22 mmol/L (ref 20–29)
Calcium: 8.7 mg/dL (ref 8.7–10.2)
Chloride: 107 mmol/L — ABNORMAL HIGH (ref 96–106)
Creatinine, Ser: 0.61 mg/dL (ref 0.57–1.00)
Globulin, Total: 2.4 g/dL (ref 1.5–4.5)
Glucose: 81 mg/dL (ref 70–99)
Potassium: 4.3 mmol/L (ref 3.5–5.2)
Sodium: 142 mmol/L (ref 134–144)
Total Protein: 6.3 g/dL (ref 6.0–8.5)
eGFR: 104 mL/min/1.73 (ref >59)

## 2024-10-25 LAB — LIPID PANEL
Chol/HDL Ratio: 2.2 ratio (ref 0.0–4.4)
Cholesterol, Total: 116 mg/dL (ref 100–199)
HDL: 53 mg/dL (ref >39)
LDL Chol Calc (NIH): 52 mg/dL (ref 0–99)
Triglycerides: 42 mg/dL (ref 0–149)
VLDL Cholesterol Cal: 11 mg/dL (ref 5–40)

## 2024-10-25 LAB — VITAMIN D 25 HYDROXY (VIT D DEFICIENCY, FRACTURES): Vit D, 25-Hydroxy: 25.9 ng/mL — ABNORMAL LOW (ref 30.0–100.0)

## 2024-10-25 LAB — TSH: TSH: 3.16 u[IU]/mL (ref 0.450–4.500)

## 2024-10-25 LAB — HEMOGLOBIN A1C
Est. average glucose Bld gHb Est-mCnc: 94 mg/dL
Hgb A1c MFr Bld: 4.9 % (ref 4.8–5.6)

## 2024-10-26 ENCOUNTER — Ambulatory Visit: Payer: Self-pay | Admitting: Obstetrics

## 2024-10-26 LAB — SURGICAL PATHOLOGY

## 2024-11-23 ENCOUNTER — Encounter: Payer: Self-pay | Admitting: General Surgery

## 2024-11-23 ENCOUNTER — Ambulatory Visit: Admitting: General Surgery

## 2024-11-23 VITALS — BP 180/97 | HR 64 | Ht 66.5 in | Wt 170.0 lb

## 2024-11-23 DIAGNOSIS — K469 Unspecified abdominal hernia without obstruction or gangrene: Secondary | ICD-10-CM | POA: Diagnosis not present

## 2024-11-23 DIAGNOSIS — S3011XD Contusion of abdominal wall, subsequent encounter: Secondary | ICD-10-CM | POA: Diagnosis not present

## 2024-11-23 DIAGNOSIS — S31109D Unspecified open wound of abdominal wall, unspecified quadrant without penetration into peritoneal cavity, subsequent encounter: Secondary | ICD-10-CM

## 2024-11-23 DIAGNOSIS — S3011XA Contusion of abdominal wall, initial encounter: Secondary | ICD-10-CM

## 2024-11-23 DIAGNOSIS — S31109A Unspecified open wound of abdominal wall, unspecified quadrant without penetration into peritoneal cavity, initial encounter: Secondary | ICD-10-CM

## 2024-11-23 NOTE — Patient Instructions (Addendum)
 We have you scheduled for a ultrasound at outpatient imaging center on 11/28/24 at 8:45 arrive at 8:30am   Nothing to eat or drink after Midnight

## 2024-11-28 ENCOUNTER — Ambulatory Visit
Admission: RE | Admit: 2024-11-28 | Discharge: 2024-11-28 | Disposition: A | Discharge status: Home / Self Care | Source: Ambulatory Visit | Attending: General Surgery | Admitting: General Surgery

## 2024-11-28 DIAGNOSIS — K469 Unspecified abdominal hernia without obstruction or gangrene: Secondary | ICD-10-CM | POA: Diagnosis present

## 2024-11-28 DIAGNOSIS — S3011XA Contusion of abdominal wall, initial encounter: Secondary | ICD-10-CM | POA: Insufficient documentation

## 2024-11-28 DIAGNOSIS — S31109A Unspecified open wound of abdominal wall, unspecified quadrant without penetration into peritoneal cavity, initial encounter: Secondary | ICD-10-CM | POA: Insufficient documentation

## 2024-11-29 NOTE — Progress Notes (Signed)
 Outpatient Surgical Follow Up    [name] is an 59 y.o. female.   Chief Complaint  Patient presents with   Follow-up    HPI: [name] is a 59 year old female who was previously known to my partner that presents in consultation for possible retained suture.  The patient has significant past surgical history of a duodenal switch and this was followed by a panniculectomy.  She subsequently had some what sounds like spitting of her suture and a reaction to the suture used in her panniculectomy.  She has undergone local wound exploration at the superior end of her incisions for recurrent suture abscesses.  She reports that now at the inferior part of her incision she feels like the same is developing.  She notices a bulge and some swelling in the area.  She notices a little bit of erythema around the area.  This is becoming painful to her.  She denies any current drainage.  Past Medical History:  Diagnosis Date   Abdominal wall seroma    Allergy    Asthma due to seasonal allergies    Bradycardia    Diastolic dysfunction    Dysplasia of cervix, low grade (CIN 1)    GERD (gastroesophageal reflux disease)    Hiatal hernia    a.) s/p repair concurrently with biliopancreatic diversion with duodenal switch procedure in 2017   History of diabetes mellitus, type II    a.) resolved after bariatric surgery done in 2017   History of hiatal hernia s/p repair    History of kidney stones    History of obstructive sleep apnea    a.) resolved after bariatric surgery done in 2017; has had repeat PSG testing that was negative since surgery   HTN (hypertension)    Hyperlipidemia    Iron  deficiency anemia    MRSA carrier    Multinodular goiter    NAFL (nonalcoholic fatty liver)    Nephrolithiasis    New onset atrial fibrillation (HCC) 09/29/2023   a.) Dx'd 09/2023; b.) CHA2DS2VASc = 3 (sex, HTN,  T2DM) as of 01/24/2024; b.) rate/rhythm maintained intrinsically without need for  pharmacological intervention; chronically anticoagulated with apixaban    On apixaban  therapy    Osteoporosis    Primary biliary cirrhosis (HCC)    Skin cancer of nose    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 Wolm @[facility] , [place]   BRACHIOPLASTY Bilateral    BREAST BIOPSY Right 2017   FIBROCYSTIC CHANGES WITH CALCIFICATIONS   CESAREAN SECTION     x 2   COLONOSCOPY WITH PROPOFOL  N/A 09/11/2019   Procedure: COLONOSCOPY WITH PROPOFOL ;  Surgeon: Therisa Bi, MD;  Location: [facility];  Service: Gastroenterology;  Laterality: N/A;   CONDYLOMA EXCISION/FULGURATION N/A 07/19/2024   Procedure: REMOVAL, CONDYLOMA;  Surgeon: Lane Shope, MD;  Location: [facility];  Service: General;  Laterality: N/A;   CYSTOSCOPY WITH RETROGRADE PYELOGRAM, URETEROSCOPY AND STENT PLACEMENT Right 02/06/2022   CYSTOSCOPY WITH STENT PLACEMENT Right 09/10/2017   Procedure: CYSTOSCOPY WITH STENT PLACEMENT;  Surgeon: Twylla Glendia BROCKS, MD;  Location: [facility];  Service: Urology;  Laterality: Right;   CYSTOSCOPY/URETEROSCOPY/HOLMIUM LASER/STENT PLACEMENT Right 04/24/2019   Procedure: CYSTOSCOPY/URETEROSCOPY/STENT PLACEMENT/ RIGHT RETROGRADE;  Surgeon: Nieves Cough, MD;  Location: [facility];  Service: Urology;  Laterality: Right;   DX LAPAROSCOPY/  EGD/  HIATAL HERNIA REPAIR CLOSURE OF INTERNAL MENSENTRY DEFECT  10/28/2017   @[facility] , [place]   EXTRACORPOREAL SHOCK WAVE LITHOTRIPSY N/A 01/30/2019    Procedure: EXTRACORPOREAL SHOCK WAVE LITHOTRIPSY (ESWL) LEFT/ POSSIBLE RIGHT;  Surgeon: Nieves Cough, MD;  Location: [facility];  Service: Urology;  Laterality: N/A;   EXTRACORPOREAL SHOCK WAVE LITHOTRIPSY Right 03/20/2019   Procedure: EXTRACORPOREAL SHOCK WAVE LITHOTRIPSY (ESWL);  Surgeon: Matilda Senior, MD;  Location: [facility];  Service: Urology;  Laterality: Right;   EXTRACORPOREAL SHOCK WAVE LITHOTRIPSY Left 10/30/2021   Procedure: EXTRACORPOREAL SHOCK WAVE LITHOTRIPSY (ESWL);  Surgeon: Cam Morene ORN, MD;  Location[facility];  Service: Urology;  Laterality: Left;   FOREIGN BODY REMOVAL ABDOMINAL N/A 07/19/2024   Procedure: REMOVAL, FOREIGN BODY, ABDOMEN;  Surgeon: Lane Shope, MD;  Location: [facility];  Service: General;  Laterality: N/A;   HERNIA REPAIR     INCISION AND DRAINAGE ABSCESS N/A 07/19/2024   Procedure: INCISION AND DRAINAGE, ABSCESS;  Surgeon: Lane Shope, MD;  Location: [facility];  Service: General;  Laterality: N/A;  Incision and drainage of abdominal wall abscess   LAPAROSCOPIC CHOLECYSTECTOMY  10/15/2016   @[facility]  , [place]   LAPAROTOMY N/A 01/26/2024   Procedure: LAPAROTOMY, EXPLORATORY of abdominal wall wounds, for removal of foreign body; ultrasound-guided aspiratio of seroma;  Surgeon: Lane Shope, MD;  Location: [facility];  Service: General;  Laterality: N/A;   LAPAROTOMY N/A 03/29/2024   Procedure: LAPAROTOMY, EXPLORATORY;  Surgeon: Lane Shope, MD;  Location: [facility];  Service: General;  Laterality: N/A;  Abdominal wall, drain/resection seroma cavity   LAPAROTOMY N/A 07/19/2024   Procedure: LAPAROTOMY, EXPLORATORY;  Surgeon: Lane Shope, MD;  Location: [facility];  Service: General;  Laterality: N/A;  VIA PREVIOUS LAPAROTOMY INCISION   LIPOSUCTION     PANNICULECTOMY     2022   TONSILLECTOMY AND ADENOIDECTOMY  child   URETEROSCOPY WITH HOLMIUM LASER LITHOTRIPSY Right 09/10/2017   Procedure: URETEROSCOPY WITH HOLMIUM  LASER LITHOTRIPSY;  Surgeon: Twylla Glendia BROCKS, MD;  Location: [facility];  Service: Urology;  Laterality: Right;    Family History  Problem Relation Age of Onset   Seizures Mother    Alcohol abuse Mother    Hyperlipidemia Mother    Hypertension Mother    Lymphoma Mother        primary site : brain   Leukemia Mother    Cancer Mother    COPD Mother    Parkinsonism Father    Hyperlipidemia Father    Hypertension Father    Parkinson's disease Father    Drug abuse Sister    Cancer Maternal Grandmother        breast cancer   Breast cancer Maternal Grandmother 107   Liver cancer Paternal Grandfather    Kidney cancer Neg Hx    Prostate cancer Neg Hx     Social History:  reports that she has never smoked. She has never been exposed to tobacco smoke. She has never used smokeless tobacco. She reports that she does not drink alcohol and does not use drugs.  Allergies: Allergies[1]  Medications reviewed.    ROS Full ROS performed and is otherwise negative other than what is stated in HPI  BP (!) 180/97   Pulse 64   Ht 5' 6.5 (1.689 m)   Wt 170 lb (77.1 kg)   LMP 07/11/2016   SpO2 98%   BMI 27.03 kg/m   Physical Exam  Alert and oriented x 3, no work of breathing room air, regular rate and rhythm, abdomen soft, there is some tenderness at the inferior part of her abdomen just inferior to her umbilicus.  There is some induration at this area with some minor erythema but no cellulitis.  There is no fluctuance on exam.  The superior and of her abdomen there is an area where there is a well-healed scar.  She states this is when the last suture abscess was debrided.   No results found for this or any previous visit (from the past 48 hours). No results found.  Assessment/Plan: Patient with recurrent what sounds like suture abscesses.  She has had a local wound exploration with excision of suture and drainage of abscess in the past.  Now she has developed some pain and swelling  inferior to her umbilicus.  She has not had imaging in some time to assess to make sure that there is no other problems such as hernia formation.  I would like to get imaging of the area.  I discussed with her that if this does look like a chronic abscess or reaction from suture then I am happy to do a local wound exploration.  Will plan to see her again after this ultrasound is completed.  1. Abdominal wall seroma, initial encounter (Primary)  - US  Abdomen Limited; Future  2. Open wound of abdominal wall, initial encounter  - US  Abdomen Limited; Future  3. Abdominal hernia without obstruction and without gangrene, recurrence not specified, unspecified hernia type  - US  Abdomen Limited; Future  A total of 45 minutes was spent reviewing the patient's chart, performing history and physical and discussing treatment options with the patient  Jayson Endow, M.D. [facility]     [1]  Allergies Allergen Reactions   Ivp Dye [Iodinated Contrast Media]     Patient 'codes' with IV contrast   Latex Hives, Shortness Of Breath and Cough   Tolmetin Hives   Gallium Nitrate Hives    AND Gadolinium Derivatives    Insulin  Detemir Hives and Swelling    Lump at injection site   Nsaids Hives    Per pt stated has to take ibuprofen with a antihistimine   Gadolinium Derivatives Hives    Pt c/o itching after contrast injection 03/15/19 @ 8:45am. [facility] Per Dr. Luverne document this as allergic reaction.

## 2024-12-07 ENCOUNTER — Encounter: Payer: Self-pay | Admitting: General Surgery

## 2024-12-07 ENCOUNTER — Ambulatory Visit: Payer: Self-pay | Admitting: General Surgery

## 2024-12-07 ENCOUNTER — Ambulatory Visit: Admitting: General Surgery

## 2024-12-07 VITALS — BP 130/78 | HR 71 | Ht 66.5 in | Wt 170.0 lb

## 2024-12-07 DIAGNOSIS — S3011XD Contusion of abdominal wall, subsequent encounter: Secondary | ICD-10-CM | POA: Diagnosis not present

## 2024-12-07 DIAGNOSIS — S3011XA Contusion of abdominal wall, initial encounter: Secondary | ICD-10-CM

## 2024-12-07 NOTE — Progress Notes (Signed)
 Outpatient Surgical Follow Up  12/07/2024  [name] is an 59 y.o. female.   Chief Complaint  Patient presents with   Follow-up    HPI: Patient returns today after getting an ultrasound.  The ultrasound showed that there was a complex fluid collection just below her umbilicus and then 1 more inferior to the right where there is a palpable abnormality.  She reports that she did have an opening of the area just inferior to her umbilicus with some expulsion of purulent fluid.  She denies any fevers or chills.  She reports that she continues to have pain in the 2 areas.  Past Medical History:  Diagnosis Date   Abdominal wall seroma    Allergy    Asthma due to seasonal allergies    Bradycardia    Diastolic dysfunction    Dysplasia of cervix, low grade (CIN 1)    GERD (gastroesophageal reflux disease)    Hiatal hernia    a.) s/p repair concurrently with biliopancreatic diversion with duodenal switch procedure in 2017   History of diabetes mellitus, type II    a.) resolved after bariatric surgery done in 2017   History of hiatal hernia s/p repair    History of kidney stones    History of obstructive sleep apnea    a.) resolved after bariatric surgery done in 2017; has had repeat PSG testing that was negative since surgery   HTN (hypertension)    Hyperlipidemia    Iron  deficiency anemia    MRSA carrier    Multinodular goiter    NAFL (nonalcoholic fatty liver)    Nephrolithiasis    New onset atrial fibrillation (HCC) 09/29/2023   a.) Dx'd 09/2023; b.) CHA2DS2VASc = 3 (sex, HTN,  T2DM) as of 01/24/2024; b.) rate/rhythm maintained intrinsically without need for pharmacological intervention; chronically anticoagulated with apixaban    On apixaban  therapy    Osteoporosis    Primary biliary cirrhosis (HCC)    Skin cancer of nose    Status post biliopancreatic diversion with duodenal switch 08/03/2016   Wears glasses     Past Surgical History:  Procedure Laterality Date    APPLICATION OF WOUND VAC N/A 01/26/2024   Procedure: APPLICATION, WOUND VAC;  Surgeon: Lane Shope, MD;  Location: [facility];  Service: General;  Laterality: N/A;   AUGMENTATION MAMMAPLASTY Bilateral 07/2021   Implants and lift   AUGMENTATION MAMMAPLASTY Bilateral 07/05/2022   Dr Vinita, replaced implants   BARIATRIC SURGERY  08/03/2016   LAPAROSCOPY DUODENAL SWITCH AND HIATAL HERNIA REPAIR; Dr. Wolm @[facility] , [place]   BRACHIOPLASTY Bilateral    BREAST BIOPSY Right 2017   FIBROCYSTIC CHANGES WITH CALCIFICATIONS   CESAREAN SECTION     x 2   COLONOSCOPY WITH PROPOFOL  N/A 09/11/2019   Procedure: COLONOSCOPY WITH PROPOFOL ;  Surgeon: Therisa Bi, MD;  Location: [facility]COPY;  Service: Gastroenterology;  Laterality: N/A;   CONDYLOMA EXCISION/FULGURATION N/A 07/19/2024   Procedure: REMOVAL, CONDYLOMA;  Surgeon: Lane Shope, MD;  Location: [facility];  Service: General;  Laterality: N/A;   CYSTOSCOPY WITH RETROGRADE PYELOGRAM, URETEROSCOPY AND STENT PLACEMENT Right 02/06/2022   CYSTOSCOPY WITH STENT PLACEMENT Right 09/10/2017   Procedure: CYSTOSCOPY WITH STENT PLACEMENT;  Surgeon: Twylla Glendia BROCKS, MD;  Location: [facility];  Service: Urology;  Laterality: Right;   CYSTOSCOPY/URETEROSCOPY/HOLMIUM LASER/STENT PLACEMENT Right 04/24/2019   Procedure: CYSTOSCOPY/URETEROSCOPY/STENT PLACEMENT/ RIGHT RETROGRADE;  Surgeon: Nieves Cough, MD;  Location: [facility];  Service: Urology;  Laterality: Right;   DX LAPAROSCOPY/  EGD/  HIATAL  HERNIA REPAIR CLOSURE OF INTERNAL MENSENTRY DEFECT  10/28/2017   @[facility] , [place]   EXTRACORPOREAL SHOCK WAVE LITHOTRIPSY N/A 01/30/2019   Procedure: EXTRACORPOREAL SHOCK WAVE LITHOTRIPSY (ESWL) LEFT/ POSSIBLE RIGHT;  Surgeon: Nieves Cough, MD;  [facility];  Service: Urology;  Laterality: N/A;   EXTRACORPOREAL SHOCK WAVE LITHOTRIPSY Right 03/20/2019   Procedure: EXTRACORPOREAL SHOCK WAVE LITHOTRIPSY (ESWL);  Surgeon: Matilda Senior, MD;   [facility];  Service: Urology;  Laterality: Right;   EXTRACORPOREAL SHOCK WAVE LITHOTRIPSY Left 10/30/2021   Procedure: EXTRACORPOREAL SHOCK WAVE LITHOTRIPSY (ESWL);  Surgeon: Cam Morene ORN, MD;  Location: [facility];  Service: Urology;  Laterality: Left;   FOREIGN BODY REMOVAL ABDOMINAL N/A 07/19/2024   Procedure: REMOVAL, FOREIGN BODY, ABDOMEN;  Surgeon: Lane Shope, MD;  Location: [facility];  Service: General;  Laterality: N/A;   HERNIA REPAIR     INCISION AND DRAINAGE ABSCESS N/A 07/19/2024   Procedure: INCISION AND DRAINAGE, ABSCESS;  Surgeon: Lane Shope, MD;  Location: [facility];  Service: General;  Laterality: N/A;  Incision and drainage of abdominal wall abscess   LAPAROSCOPIC CHOLECYSTECTOMY  10/15/2016   @[facility]  , [place]   LAPAROTOMY N/A 01/26/2024   Procedure: LAPAROTOMY, EXPLORATORY of abdominal wall wounds, for removal of foreign body; ultrasound-guided aspiratio of seroma;  Surgeon: Lane Shope, MD;  Location: [facility];  Service: General;  Laterality: N/A;   LAPAROTOMY N/A 03/29/2024   Procedure: LAPAROTOMY, EXPLORATORY;  Surgeon: Lane Shope, MD;  Location: [facility];  Service: General;  Laterality: N/A;  Abdominal wall, drain/resection seroma cavity   LAPAROTOMY N/A 07/19/2024   Procedure: LAPAROTOMY, EXPLORATORY;  Surgeon: Lane Shope, MD;  Location: [facility];  Service: General;  Laterality: N/A;  VIA PREVIOUS LAPAROTOMY INCISION   LIPOSUCTION     PANNICULECTOMY     2022   TONSILLECTOMY AND ADENOIDECTOMY  child   URETEROSCOPY WITH HOLMIUM LASER LITHOTRIPSY Right 09/10/2017   Procedure: URETEROSCOPY WITH HOLMIUM LASER LITHOTRIPSY;  Surgeon: Twylla Glendia BROCKS, MD;  Location: [facility];  Service: Urology;  Laterality: Right;    Family History  Problem Relation Age of Onset   Seizures Mother    Alcohol abuse Mother    Hyperlipidemia Mother    Hypertension Mother    Lymphoma Mother        primary site : brain   Leukemia  Mother    Cancer Mother    COPD Mother    Parkinsonism Father    Hyperlipidemia Father    Hypertension Father    Parkinson's disease Father    Drug abuse Sister    Cancer Maternal Grandmother        breast cancer   Breast cancer Maternal Grandmother 58   Liver cancer Paternal Grandfather    Kidney cancer Neg Hx    Prostate cancer Neg Hx     Social History:  reports that she has never smoked. She has never been exposed to tobacco smoke. She has never used smokeless tobacco. She reports that she does not drink alcohol and does not use drugs.  Allergies: Allergies[1]  Medications reviewed.    ROS Full ROS performed and is otherwise negative other than what is stated in HPI   BP 130/78   Pulse 71   Ht 5' 6.5 (1.689 m)   Wt 170 lb (77.1 kg)   LMP 07/11/2016   SpO2 98%   BMI 27.03 kg/m   Physical Exam  Inferior to her umbilicus there is a area of excoriation of the skin with some  red granulation tissue under this.  I can palpate the area and there does feel like there is a complex fluid collection inferior to this which would correspond to what seen on the ultrasound.  More inferior along the length of her panniculectomy scar there is an area of swelling that feels like a marble.  There is no erythema or for this.   Ultrasound personally reviewed and there is a complex fluid collection just inferior to her umbilicus and another fluid collection that is at the site of her corresponding palpable abnormality. Assessment/Plan:  Patient with likely suture abscess at the site of previous panniculectomy and prior surgeries.  She has had I&D's of the area as before along the superior part of her incision.  We will plan for abdominal wall exploration, removal of foreign body and incision and drainage of abscess.  Discussed risk, benefits alternatives to procedure and she agrees to proceed.   Jayson Endow, M.D. [facility]     [1]  Allergies Allergen  Reactions   Ivp Dye [Iodinated Contrast Media]     Patient 'codes' with IV contrast   Latex Hives, Shortness Of Breath and Cough   Tolmetin Hives   Gallium Nitrate Hives    AND Gadolinium Derivatives    Insulin  Detemir Hives and Swelling    Lump at injection site   Nsaids Hives    Per pt stated has to take ibuprofen with a antihistimine   Gadolinium Derivatives Hives    Pt c/o itching after contrast injection 03/15/19 @ 8:45am. [facility] Per Dr. Luverne document this as allergic reaction.

## 2024-12-12 ENCOUNTER — Inpatient Hospital Stay: Admission: RE | Admit: 2024-12-12 | Discharge: 2024-12-12 | Attending: General Surgery

## 2024-12-12 ENCOUNTER — Other Ambulatory Visit: Payer: Self-pay

## 2024-12-15 ENCOUNTER — Ambulatory Visit: Admitting: Anesthesiology

## 2024-12-15 ENCOUNTER — Encounter: Payer: Self-pay | Admitting: General Surgery

## 2024-12-15 ENCOUNTER — Ambulatory Visit
Admission: RE | Admit: 2024-12-15 | Discharge: 2024-12-15 | Disposition: A | Source: Home / Self Care | Attending: General Surgery | Admitting: General Surgery

## 2024-12-15 ENCOUNTER — Other Ambulatory Visit: Payer: Self-pay

## 2024-12-15 ENCOUNTER — Encounter: Admission: RE | Disposition: A | Payer: Self-pay | Source: Home / Self Care | Attending: General Surgery

## 2024-12-15 DIAGNOSIS — S3011XA Contusion of abdominal wall, initial encounter: Secondary | ICD-10-CM

## 2024-12-15 MED ORDER — DIPHENHYDRAMINE HCL 50 MG/ML IJ SOLN
25.0000 mg | Freq: Once | INTRAMUSCULAR | Status: AC
  Administered 2024-12-15: 25 mg via INTRAVENOUS

## 2024-12-15 MED ORDER — DEXAMETHASONE SOD PHOSPHATE PF 10 MG/ML IJ SOLN
INTRAMUSCULAR | Status: DC | PRN
  Administered 2024-12-15: 10 mg via INTRAVENOUS

## 2024-12-15 MED ORDER — PROPOFOL 10 MG/ML IV BOLUS
INTRAVENOUS | Status: DC | PRN
  Administered 2024-12-15: 50 mg via INTRAVENOUS
  Administered 2024-12-15: 150 mg via INTRAVENOUS

## 2024-12-15 MED ORDER — CHLORHEXIDINE GLUCONATE CLOTH 2 % EX PADS: 6.0000 | MEDICATED_PAD | Freq: Once | CUTANEOUS | Status: DC

## 2024-12-15 MED ORDER — BUPIVACAINE-EPINEPHRINE (PF) 0.5% -1:200000 IJ SOLN
INTRAMUSCULAR | Status: DC | PRN
  Administered 2024-12-15: 30 mL

## 2024-12-15 MED ORDER — ONDANSETRON HCL 4 MG/2ML IJ SOLN
INTRAMUSCULAR | Status: AC
  Filled 2024-12-15: qty 2

## 2024-12-15 MED ORDER — ONDANSETRON HCL 4 MG/2ML IJ SOLN
INTRAMUSCULAR | Status: DC | PRN
  Administered 2024-12-15: 4 mg via INTRAVENOUS

## 2024-12-15 MED ORDER — CEFAZOLIN SODIUM-DEXTROSE 2-4 GM/100ML-% IV SOLN
2.0000 g | INTRAVENOUS | Status: AC
  Administered 2024-12-15: 2 g via INTRAVENOUS

## 2024-12-15 MED ORDER — EPHEDRINE SULFATE-NACL 50-0.9 MG/10ML-% IV SOSY
PREFILLED_SYRINGE | INTRAVENOUS | Status: DC | PRN
  Administered 2024-12-15 (×3): 5 mg via INTRAVENOUS

## 2024-12-15 MED ORDER — BUPIVACAINE-EPINEPHRINE (PF) 0.5% -1:200000 IJ SOLN
INTRAMUSCULAR | Status: AC
  Filled 2024-12-15: qty 30

## 2024-12-15 MED ORDER — 0.9 % SODIUM CHLORIDE (POUR BTL) OPTIME
TOPICAL | Status: DC | PRN
  Administered 2024-12-15: 500 mL

## 2024-12-15 MED ORDER — FENTANYL CITRATE (PF) 100 MCG/2ML IJ SOLN
25.0000 ug | INTRAMUSCULAR | Status: DC | PRN
  Administered 2024-12-15 (×3): 25 ug via INTRAVENOUS

## 2024-12-15 MED ORDER — DEXAMETHASONE SOD PHOSPHATE PF 10 MG/ML IJ SOLN
INTRAMUSCULAR | Status: AC
  Filled 2024-12-15: qty 1

## 2024-12-15 MED ORDER — FENTANYL CITRATE (PF) 100 MCG/2ML IJ SOLN
INTRAMUSCULAR | Status: AC
  Filled 2024-12-15: qty 2

## 2024-12-15 MED ORDER — CHLORHEXIDINE GLUCONATE CLOTH 2 % EX PADS
6.0000 | MEDICATED_PAD | Freq: Once | CUTANEOUS | Status: AC
  Administered 2024-12-15: 6 via TOPICAL

## 2024-12-15 MED ORDER — FENTANYL CITRATE (PF) 100 MCG/2ML IJ SOLN
INTRAMUSCULAR | Status: DC | PRN
  Administered 2024-12-15 (×2): 50 ug via INTRAVENOUS

## 2024-12-15 MED ORDER — ORAL CARE MOUTH RINSE: 15.0000 mL | Freq: Once | OROMUCOSAL | Status: AC

## 2024-12-15 MED ORDER — LIDOCAINE HCL (PF) 2 % IJ SOLN
INTRAMUSCULAR | Status: AC
  Filled 2024-12-15: qty 5

## 2024-12-15 MED ORDER — CHLORHEXIDINE GLUCONATE 0.12 % MT SOLN
15.0000 mL | Freq: Once | OROMUCOSAL | Status: AC
  Administered 2024-12-15: 15 mL via OROMUCOSAL

## 2024-12-15 MED ORDER — PROPOFOL 10 MG/ML IV BOLUS
INTRAVENOUS | Status: AC
  Filled 2024-12-15: qty 20

## 2024-12-15 MED ORDER — LACTATED RINGERS IV SOLN: INTRAVENOUS | Status: DC

## 2024-12-15 MED ORDER — DIPHENHYDRAMINE HCL 50 MG/ML IJ SOLN
INTRAMUSCULAR | Status: AC
  Filled 2024-12-15: qty 1

## 2024-12-15 MED ORDER — DROPERIDOL 2.5 MG/ML IJ SOLN: 0.6250 mg | Freq: Once | INTRAMUSCULAR | Status: DC | PRN

## 2024-12-15 MED ORDER — MIDAZOLAM HCL (PF) 2 MG/2ML IJ SOLN
INTRAMUSCULAR | Status: DC | PRN
  Administered 2024-12-15: 2 mg via INTRAVENOUS

## 2024-12-15 MED ORDER — OXYCODONE HCL 5 MG PO TABS: 5.0000 mg | ORAL_TABLET | Freq: Once | ORAL | Status: DC | PRN

## 2024-12-15 MED ORDER — ACETAMINOPHEN 10 MG/ML IV SOLN: 1000.0000 mg | Freq: Once | INTRAVENOUS | Status: DC | PRN

## 2024-12-15 MED ORDER — LIDOCAINE HCL (CARDIAC) PF 100 MG/5ML IV SOSY
PREFILLED_SYRINGE | INTRAVENOUS | Status: DC | PRN
  Administered 2024-12-15: 100 mg via INTRAVENOUS

## 2024-12-15 MED ORDER — CHLORHEXIDINE GLUCONATE 0.12 % MT SOLN
OROMUCOSAL | Status: AC
  Filled 2024-12-15: qty 15

## 2024-12-15 MED ORDER — CEFAZOLIN SODIUM-DEXTROSE 2-4 GM/100ML-% IV SOLN
INTRAVENOUS | Status: AC
  Filled 2024-12-15: qty 100

## 2024-12-15 MED ORDER — EPHEDRINE 5 MG/ML INJ
INTRAVENOUS | Status: AC
  Filled 2024-12-15: qty 5

## 2024-12-15 MED ORDER — MIDAZOLAM HCL 2 MG/2ML IJ SOLN
INTRAMUSCULAR | Status: AC
  Filled 2024-12-15: qty 2

## 2024-12-15 MED ORDER — OXYCODONE HCL 5 MG/5ML PO SOLN: 5.0000 mg | Freq: Once | ORAL | Status: DC | PRN

## 2024-12-15 NOTE — Anesthesia Preprocedure Evaluation (Addendum)
 "                                  Anesthesia Evaluation  Patient identified by MRN, date of birth, ID band Patient awake    Reviewed: Allergy & Precautions, H&P , NPO status , Patient's Chart, lab work & pertinent test results  Airway Mallampati: II  TM Distance: >3 FB Neck ROM: full    Dental no notable dental hx.    Pulmonary    Pulmonary exam normal        Cardiovascular hypertension, + dysrhythmias (short duration afib remotely)   Diastolic dysfunction   Neuro/Psych negative neurological ROS  negative psych ROS   GI/Hepatic hiatal hernia,GERD  ,,(+) Hepatitis -Primary Biliary Cirrhosis hiatal hernia (s/p repair)   Endo/Other   multinodular goiter  Renal/GU      Musculoskeletal   Abdominal   Peds  Hematology   Anesthesia Other Findings Past Medical History: No date: Asthma due to seasonal allergies No date: Bradycardia No date:  No date: Dysplasia of cervix, low grade (CIN 1) No date: GERD (gastroesophageal reflux disease) No date: Hiatal hernia     Comment:  a.) s/p repair concurrently with biliopancreatic               diversion with duodenal switch procedure in 2017 No date: History of diabetes mellitus, type II     Comment:  a.) resolved after bariatric surgery done in 2017 No date: History of hiatal hernia s/p repair No date: History of kidney stones No date: History of obstructive sleep apnea     Comment:  a.) resolved after bariatric surgery done in 2017; has               had repeat PSG testing that was negative since surgery No date: HTN (hypertension) No date: Hyperlipidemia No date: Iron  deficiency anemia No date: MRSA carrier No date: Multinodular goiter No date: NAFL (nonalcoholic fatty liver) No date: Nephrolithiasis 09/29/2023: New onset atrial fibrillation (HCC)     Comment:  a.) Dx'd 09/2023; b.) CHA2DS2VASc = 3 (sex, HTN,  T2DM)               as of 01/24/2024; b.) rate/rhythm maintained               intrinsically  without need for pharmacological               intervention; chronically anticoagulated with apixaban  No date: On apixaban  therapy No date: Osteoporosis No date: Primary biliary cirrhosis (HCC) No date: Skin cancer of nose 08/03/2016: Status post biliopancreatic diversion with duodenal switch No date: Wears glasses  Past Surgical History: 01/26/2024: APPLICATION OF WOUND VAC; N/A     Comment:  Procedure: APPLICATION, WOUND VAC;  Surgeon: Lane Shope, MD;  Location: [facility];  Service: General;                Laterality: N/A; 07/2021: AUGMENTATION MAMMAPLASTY; Bilateral     Comment:  Implants and lift 07/05/2022: AUGMENTATION MAMMAPLASTY; Bilateral     Comment:  Dr Vinita, replaced implants 08/03/2016: BARIATRIC SURGERY     Comment:  LAPAROSCOPY DUODENAL SWITCH AND HIATAL HERNIA REPAIR;               Dr. Wolm @[facility] ,  No date: BRACHIOPLASTY; Bilateral 2017: BREAST BIOPSY; Right     Comment:  FIBROCYSTIC CHANGES WITH CALCIFICATIONS No date: CESAREAN SECTION     Comment:  x 2 09/11/2019: COLONOSCOPY WITH PROPOFOL ; N/A     Comment:  Procedure: COLONOSCOPY WITH PROPOFOL ;  Surgeon: Therisa Bi, MD;  Location: [facility];  Service:               Gastroenterology;  Laterality: N/A; 02/06/2022: CYSTOSCOPY WITH RETROGRADE PYELOGRAM, URETEROSCOPY AND  STENT PLACEMENT; Right 09/10/2017: CYSTOSCOPY WITH STENT PLACEMENT; Right     Comment:  Procedure: CYSTOSCOPY WITH STENT PLACEMENT;  Surgeon:               Twylla Glendia BROCKS, MD;  Location: [facility];  Service:               Urology;  Laterality: Right; 04/24/2019: CYSTOSCOPY/URETEROSCOPY/HOLMIUM LASER/STENT PLACEMENT;  Right     Comment:  Procedure: CYSTOSCOPY/URETEROSCOPY/STENT PLACEMENT/               RIGHT RETROGRADE;  Surgeon: Nieves Cough, MD;                Location: [facility];  Service: Urology;               Laterality: Right; 10/28/2017: DX LAPAROSCOPY/  EGD/  HIATAL  HERNIA REPAIR CLOSURE OF  INTERNAL MENSENTRY DEFECT     Comment:  @[facility] , [place] 01/30/2019: EXTRACORPOREAL SHOCK WAVE LITHOTRIPSY; N/A     Comment:  Procedure: EXTRACORPOREAL SHOCK WAVE LITHOTRIPSY (ESWL)               LEFT/ POSSIBLE RIGHT;  Surgeon: Nieves Cough, MD;                [facility];  Service: Urology;  Laterality: N/A; 03/20/2019: EXTRACORPOREAL SHOCK WAVE LITHOTRIPSY; Right     Comment:  Procedure: EXTRACORPOREAL SHOCK WAVE LITHOTRIPSY (ESWL);              Surgeon: Matilda Senior, MD;  Location: [facility];                Service: Urology;  Laterality: Right; 10/30/2021: EXTRACORPOREAL SHOCK WAVE LITHOTRIPSY; Left     Comment:  Procedure: EXTRACORPOREAL SHOCK WAVE LITHOTRIPSY (ESWL);              Surgeon: Cam Morene ORN, MD;  Location: [facility];  Service: Urology;  Laterality: Left; 10/15/2016: LAPAROSCOPIC CHOLECYSTECTOMY     Comment:  @[facility]  , [place] 01/26/2024: LAPAROTOMY; N/A     Comment:  Procedure: LAPAROTOMY, EXPLORATORY of abdominal wall               wounds, for removal of foreign body; ultrasound-guided               aspiratio of seroma;  Surgeon: Lane Shope, MD;                Location: [facility];  Service: General;  Laterality: N/A; No date: LIPOSUCTION No date: PANNICULECTOMY     Comment:  2022 child: TONSILLECTOMY AND ADENOIDECTOMY 09/10/2017: URETEROSCOPY WITH HOLMIUM LASER LITHOTRIPSY; Right     Comment:  Procedure: URETEROSCOPY WITH HOLMIUM LASER LITHOTRIPSY;               Surgeon: Twylla Glendia BROCKS, MD;  Location: [facility];                Service: Urology;  Laterality: Right;  BMI    Body Mass Index: 27.82 kg/m      Reproductive/Obstetrics negative OB ROS                              Anesthesia Physical Anesthesia Plan  ASA: 3  Anesthesia Plan: General   Post-op Pain Management: Minimal or no pain anticipated   Induction: Intravenous  PONV Risk Score and Plan:  2 and Ondansetron , Dexamethasone  and Midazolam   Airway Management Planned: Oral ETT and LMA  Additional Equipment:   Intra-op Plan:   Post-operative Plan: Extubation in OR  Informed Consent: I have reviewed the patients History and Physical, chart, labs and discussed the procedure including the risks, benefits and alternatives for the proposed anesthesia with the patient or authorized representative who has indicated his/her understanding and acceptance.     Dental Advisory Given  Plan Discussed with: CRNA and Surgeon  Anesthesia Plan Comments:         Anesthesia Quick Evaluation  "

## 2024-12-15 NOTE — Op Note (Signed)
 Operative Note  Pre-Op Diagnosis: Abdominal wall soft tissue mass, chronic draining wound of abdomen Postop diagnosis: Abdominal wall soft tissue mass, suture granuloma Surgeon: Jayson Endow, MD EBL: 3ccs Procedure: Excision of abdominal wall soft tissue mass measuring 6 mm, incision and drainage of abdominal wall granuloma and excision of suture material  Indications: The patient had a previous abdominoplasty and has had recurrent draining sinuses of her abdominal wall.  She has previously had incision and drainage and removal of foreign body so she presents to our clinic for incision and drainage of the tract as well as a small soft tissue mass in the lower abdomen.  After informed consent was obtained the patient was brought to the operating room and placed supine on the operating room table.  General endotracheal anesthesia was then induced and her abdomen was then prepped and draped in the usual sterile fashion.  A surgical timeout was called identifying correct patient, site, side and procedure.  I started with the lower abdominal soft tissue mass.  This was just inferior to her abdominoplasty scar.  This was in the subcutaneous tissue.  Incision was made over this and it was dissected down through the subcutaneous tissue.  It was grasped with an Allis clamp and dissected from the surrounding fatty tissue.  It was then excised fully and passed off the table as specimen.  I then felt the area and there was no further evidence of mass or suture at this area.  The Nashed was in the subcutaneous tissue and measured approximately 6 mm.  I then turned my attention to her chronic drainage tract.  Hemostat was inserted into the tract and it appeared that there was a granuloma cavity.  There was no pus encountered.  An incision was made over this cavity.  This opened up the cavity and I noticed that there was a suture material.  The suture material was dissected out from the surrounding subcutaneous tissue.   This was a strata fix suture and I was able to open up the granuloma to fully remove the suture.  This was suprafascial and did not incorporate any fascia into the suture.  Once I was able to dissect out the suture it was cut and removed from the body.  Some of the inflammatory tissue was opened further and the cavity was bluntly dissected with my finger.  Hemostasis was obtained.  It was then irrigated with warm saline solution.  The lower abdominal wound that contained soft tissue mass was then closed in with 3-0 nylon suture.  The granuloma cavity was packed with gauze and both wounds were dressed with ABD pads.  Prior to termination of procedure all sponge and instrument counts were correct x 2 the patient was taken to the PACU after extubation in good condition.

## 2024-12-15 NOTE — H&P (Signed)
 No changes to below H and P, proceed as planned  Chief Complaint  Patient presents with   Follow-up      HPI: Patient returns today after getting an ultrasound.  The ultrasound showed that there was a complex fluid collection just below her umbilicus and then 1 more inferior to the right where there is a palpable abnormality.  She reports that she did have an opening of the area just inferior to her umbilicus with some expulsion of purulent fluid.  She denies any fevers or chills.  She reports that she continues to have pain in the 2 areas.       Past Medical History:  Diagnosis Date   Abdominal wall seroma     Allergy     Asthma due to seasonal allergies     Bradycardia     Diastolic dysfunction     Dysplasia of cervix, low grade (CIN 1)     GERD (gastroesophageal reflux disease)     Hiatal hernia      a.) s/p repair concurrently with biliopancreatic diversion with duodenal switch procedure in 2017   History of diabetes mellitus, type II      a.) resolved after bariatric surgery done in 2017   History of hiatal hernia s/p repair     History of kidney stones     History of obstructive sleep apnea      a.) resolved after bariatric surgery done in 2017; has had repeat PSG testing that was negative since surgery   HTN (hypertension)     Hyperlipidemia     Iron  deficiency anemia     MRSA carrier     Multinodular goiter     NAFL (nonalcoholic fatty liver)     Nephrolithiasis     New onset atrial fibrillation (HCC) 09/29/2023    a.) Dx'd 09/2023; b.) CHA2DS2VASc = 3 (sex, HTN,  T2DM) as of 01/24/2024; b.) rate/rhythm maintained intrinsically without need for pharmacological intervention; chronically anticoagulated with apixaban    On apixaban  therapy     Osteoporosis     Primary biliary cirrhosis (HCC)     Skin cancer of nose     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 Wolm @[facility] , [place]   BRACHIOPLASTY Bilateral     BREAST BIOPSY Right 2017    FIBROCYSTIC CHANGES WITH CALCIFICATIONS   CESAREAN SECTION        x 2   COLONOSCOPY WITH PROPOFOL  N/A 09/11/2019    Procedure: COLONOSCOPY WITH PROPOFOL ;  Surgeon: Therisa Bi, MD;  Location: [facility];  Service: Gastroenterology;  Laterality: N/A;   CONDYLOMA EXCISION/FULGURATION N/A 07/19/2024    Procedure: REMOVAL, CONDYLOMA;  Surgeon: Lane Shope, MD;  Location: [facility];  Service: General;  Laterality: N/A;   CYSTOSCOPY WITH RETROGRADE PYELOGRAM, URETEROSCOPY AND STENT PLACEMENT Right 02/06/2022   CYSTOSCOPY WITH STENT PLACEMENT Right 09/10/2017    Procedure: CYSTOSCOPY WITH STENT PLACEMENT;  Surgeon:  Stoioff, Glendia BROCKS, MD;  Location: [facility];  Service: Urology;  Laterality: Right;   CYSTOSCOPY/URETEROSCOPY/HOLMIUM LASER/STENT PLACEMENT Right 04/24/2019    Procedure: CYSTOSCOPY/URETEROSCOPY/STENT PLACEMENT/ RIGHT RETROGRADE;  Surgeon: Nieves Cough, MD;  Location: [facility];  Service: Urology;  Laterality: Right;   DX LAPAROSCOPY/  EGD/  HIATAL HERNIA REPAIR CLOSURE OF INTERNAL MENSENTRY DEFECT   10/28/2017    @[facility] , [place]   EXTRACORPOREAL SHOCK WAVE LITHOTRIPSY N/A 01/30/2019    Procedure: EXTRACORPOREAL SHOCK WAVE LITHOTRIPSY (ESWL) LEFT/ POSSIBLE RIGHT;  Surgeon: Nieves Cough, MD;  Location: [facility];  Service: Urology;  Laterality: N/A;   EXTRACORPOREAL SHOCK WAVE LITHOTRIPSY Right 03/20/2019    Procedure: EXTRACORPOREAL SHOCK WAVE LITHOTRIPSY (ESWL);  Surgeon:  Matilda Senior, MD;  Location: [facility];  Service: Urology;  Laterality: Right;   EXTRACORPOREAL SHOCK WAVE LITHOTRIPSY Left 10/30/2021    Procedure: EXTRACORPOREAL SHOCK WAVE LITHOTRIPSY (ESWL);  Surgeon: Cam Morene ORN, MD;  Location: [facility];  Service: Urology;  Laterality: Left;   FOREIGN BODY REMOVAL ABDOMINAL N/A 07/19/2024    Procedure: REMOVAL, FOREIGN BODY, ABDOMEN;  Surgeon: Lane Shope, MD;  Location: [facility];  Service: General;  Laterality: N/A;   HERNIA REPAIR       INCISION AND DRAINAGE ABSCESS N/A 07/19/2024    Procedure: INCISION AND DRAINAGE, ABSCESS;  Surgeon: Lane Shope, MD;  Location: [facility];  Service: General;  Laterality: N/A;  Incision and drainage of abdominal wall abscess   LAPAROSCOPIC CHOLECYSTECTOMY   10/15/2016    @[facility]  , [place]   LAPAROTOMY N/A 01/26/2024    Procedure: LAPAROTOMY, EXPLORATORY of abdominal wall wounds, for removal of foreign body; ultrasound-guided aspiratio of seroma;  Surgeon: Lane Shope, MD;  Location: [facility];  Service: General;  Laterality: N/A;   LAPAROTOMY N/A 03/29/2024    Procedure: LAPAROTOMY, EXPLORATORY;  Surgeon: Lane Shope, MD;  Location: [facility];  Service: General;  Laterality: N/A;  Abdominal wall, drain/resection seroma cavity   LAPAROTOMY N/A 07/19/2024    Procedure: LAPAROTOMY, EXPLORATORY;  Surgeon: Lane Shope, MD;  Location: [facility];  Service: General;  Laterality: N/A;  VIA PREVIOUS LAPAROTOMY INCISION   LIPOSUCTION       PANNICULECTOMY        2022   TONSILLECTOMY AND ADENOIDECTOMY   child   URETEROSCOPY WITH HOLMIUM LASER LITHOTRIPSY Right 09/10/2017    Procedure: URETEROSCOPY WITH HOLMIUM LASER LITHOTRIPSY;  Surgeon: Twylla Glendia BROCKS, MD;  Location: [facility];  Service: Urology;  Laterality: Right;               Family History  Problem Relation Age of Onset   Seizures Mother     Alcohol abuse Mother     Hyperlipidemia Mother     Hypertension Mother      Lymphoma Mother          primary site : brain   Leukemia Mother     Cancer Mother     COPD Mother     Parkinsonism Father     Hyperlipidemia Father     Hypertension Father     Parkinson's disease Father     Drug abuse Sister     Cancer Maternal Grandmother          breast cancer   Breast cancer Maternal Grandmother 3   Liver cancer Paternal Grandfather     Kidney cancer Neg Hx     Prostate cancer Neg Hx            Social History:  reports  that she has never smoked. She has never been exposed to tobacco smoke. She has never used smokeless tobacco. She reports that she does not drink alcohol and does not use drugs.   Allergies: [Allergies]  [Allergies]      Allergen Reactions   Ivp Dye [Iodinated Contrast Media]        Patient 'codes' with IV contrast   Latex Hives, Shortness Of Breath and Cough   Tolmetin Hives   Gallium Nitrate Hives      AND Gadolinium Derivatives     Insulin  Detemir Hives and Swelling      Lump at injection site   Nsaids Hives      Per pt stated has to take ibuprofen with a antihistimine   Gadolinium Derivatives Hives      Pt c/o itching after contrast injection 03/15/19 @ 8:45am. [facility] Per Dr. Luverne document this as allergic reaction.      Medications reviewed.       ROS Full ROS performed and is otherwise negative other than what is stated in HPI     BP 130/78   Pulse 71   Ht 5' 6.5 (1.689 m)   Wt 170 lb (77.1 kg)   LMP 07/11/2016   SpO2 98%   BMI 27.03 kg/m    Physical Exam   Inferior to her umbilicus there is a area of excoriation of the skin with some red granulation tissue under this.  I can palpate the area and there does feel like there is a complex fluid collection inferior to this which would correspond to what seen on the ultrasound.  More inferior along the length of her panniculectomy scar there is an area of swelling that feels like a marble.  There is no erythema or for this.     Ultrasound personally reviewed and  there is a complex fluid collection just inferior to her umbilicus and another fluid collection that is at the site of her corresponding palpable abnormality. Assessment/Plan:   Patient with likely suture abscess at the site of previous panniculectomy and prior surgeries.  She has had I&D's of the area as before along the superior part of her incision.  We will plan for abdominal wall exploration, removal of foreign body and incision and drainage of abscess.  Discussed risk, benefits alternatives to procedure and she agrees to proceed.     Jayson Endow, M.D. [facility]

## 2024-12-15 NOTE — Anesthesia Procedure Notes (Signed)
 Procedure Name: LMA Insertion Date/Time: 12/15/2024 7:36 AM  Performed by: Jackye Spanner, CRNAPre-anesthesia Checklist: Patient identified, Patient being monitored, Timeout performed, Emergency Drugs available and Suction available Patient Re-evaluated:Patient Re-evaluated prior to induction Oxygen Delivery Method: Circle system utilized Preoxygenation: Pre-oxygenation with 100% oxygen Induction Type: IV induction Ventilation: Mask ventilation without difficulty LMA: LMA inserted LMA Size: 3.0 Tube type: Oral Number of attempts: 1 Placement Confirmation: positive ETCO2 and breath sounds checked- equal and bilateral Tube secured with: Tape Dental Injury: Teeth and Oropharynx as per pre-operative assessment  Comments: Smooth atraumatic LMA placement, no complications noted.

## 2024-12-15 NOTE — Transfer of Care (Signed)
 Immediate Anesthesia Transfer of Care Note  Patient: [name]  Procedure(s) Performed: DEBRIDEMENT OF ABDOMINAL WALL ABSCESS (Abdomen)  Patient Location: PACU  Anesthesia Type:General  Level of Consciousness: drowsy  Airway & Oxygen Therapy: Patient Spontanous Breathing and Patient connected to face mask oxygen  Post-op Assessment: Report given to RN and Post -op Vital signs reviewed and stable  Post vital signs: Reviewed and stable  Last Vitals:  Vitals Value Taken Time  BP 107/65 12/15/24 08:15  Temp 36.1 0815  Pulse 64 12/15/24 08:15  Resp 15 12/15/24 08:15  SpO2 99 % 12/15/24 08:15  Vitals shown include unfiled device data.  Last Pain:  Vitals:   12/15/24 0624  TempSrc: Temporal  PainSc: 0-No pain         Complications: No notable events documented.

## 2024-12-20 ENCOUNTER — Other Ambulatory Visit

## 2024-12-28 ENCOUNTER — Encounter: Admitting: General Surgery

## 2025-01-08 ENCOUNTER — Ambulatory Visit: Admitting: Internal Medicine

## 2025-01-25 ENCOUNTER — Other Ambulatory Visit

## 2025-02-01 ENCOUNTER — Ambulatory Visit

## 2025-02-01 ENCOUNTER — Ambulatory Visit: Admitting: Oncology

## 2025-04-09 ENCOUNTER — Ambulatory Visit: Admitting: Family
